# Patient Record
Sex: Female | Born: 1945 | Race: Black or African American | Hispanic: No | Marital: Married | Smoking: Former smoker
Health system: Southern US, Community
[De-identification: ages and names within clinical notes are randomized; demographics above are authoritative.]

## PROBLEM LIST (undated history)

## (undated) DIAGNOSIS — R7303 Prediabetes: Secondary | ICD-10-CM

## (undated) DIAGNOSIS — G8929 Other chronic pain: Secondary | ICD-10-CM

## (undated) DIAGNOSIS — R002 Palpitations: Secondary | ICD-10-CM

## (undated) DIAGNOSIS — I10 Essential (primary) hypertension: Secondary | ICD-10-CM

## (undated) DIAGNOSIS — J189 Pneumonia, unspecified organism: Secondary | ICD-10-CM

## (undated) DIAGNOSIS — K219 Gastro-esophageal reflux disease without esophagitis: Secondary | ICD-10-CM

## (undated) DIAGNOSIS — K5790 Diverticulosis of intestine, part unspecified, without perforation or abscess without bleeding: Secondary | ICD-10-CM

## (undated) DIAGNOSIS — E785 Hyperlipidemia, unspecified: Secondary | ICD-10-CM

## (undated) DIAGNOSIS — R7611 Nonspecific reaction to tuberculin skin test without active tuberculosis: Secondary | ICD-10-CM

## (undated) DIAGNOSIS — M1990 Unspecified osteoarthritis, unspecified site: Secondary | ICD-10-CM

## (undated) DIAGNOSIS — M549 Dorsalgia, unspecified: Secondary | ICD-10-CM

## (undated) HISTORY — DX: Nonspecific reaction to tuberculin skin test without active tuberculosis: R76.11

## (undated) HISTORY — PX: TUBAL LIGATION: SHX77

## (undated) HISTORY — DX: Dorsalgia, unspecified: M54.9

## (undated) HISTORY — DX: Other chronic pain: G89.29

## (undated) HISTORY — DX: Palpitations: R00.2

## (undated) HISTORY — DX: Diverticulosis of intestine, part unspecified, without perforation or abscess without bleeding: K57.90

## (undated) HISTORY — DX: Hyperlipidemia, unspecified: E78.5

## (undated) HISTORY — DX: Essential (primary) hypertension: I10

---

## 1980-07-24 HISTORY — PX: CHOLECYSTECTOMY: SHX55

## 1997-12-18 ENCOUNTER — Ambulatory Visit (HOSPITAL_COMMUNITY): Admission: RE | Admit: 1997-12-18 | Discharge: 1997-12-18 | Payer: Self-pay | Admitting: Family Medicine

## 1999-09-07 ENCOUNTER — Other Ambulatory Visit: Admission: RE | Admit: 1999-09-07 | Discharge: 1999-09-07 | Payer: Self-pay | Admitting: Family Medicine

## 2000-09-17 ENCOUNTER — Encounter: Admission: RE | Admit: 2000-09-17 | Discharge: 2000-09-17 | Payer: Self-pay | Admitting: Family Medicine

## 2000-09-25 ENCOUNTER — Ambulatory Visit (HOSPITAL_COMMUNITY): Admission: RE | Admit: 2000-09-25 | Discharge: 2000-09-25 | Payer: Self-pay

## 2001-06-10 ENCOUNTER — Encounter: Admission: RE | Admit: 2001-06-10 | Discharge: 2001-06-10 | Payer: Self-pay | Admitting: Sports Medicine

## 2001-09-30 ENCOUNTER — Ambulatory Visit (HOSPITAL_COMMUNITY): Admission: RE | Admit: 2001-09-30 | Discharge: 2001-09-30 | Payer: Self-pay | Admitting: *Deleted

## 2001-09-30 ENCOUNTER — Encounter: Payer: Self-pay | Admitting: *Deleted

## 2001-11-04 ENCOUNTER — Encounter: Admission: RE | Admit: 2001-11-04 | Discharge: 2001-11-04 | Payer: Self-pay | Admitting: Family Medicine

## 2001-12-06 ENCOUNTER — Ambulatory Visit (HOSPITAL_COMMUNITY): Admission: RE | Admit: 2001-12-06 | Discharge: 2001-12-06 | Payer: Self-pay | Admitting: Family Medicine

## 2001-12-06 ENCOUNTER — Encounter: Admission: RE | Admit: 2001-12-06 | Discharge: 2001-12-06 | Payer: Self-pay | Admitting: Family Medicine

## 2001-12-09 ENCOUNTER — Encounter: Admission: RE | Admit: 2001-12-09 | Discharge: 2001-12-09 | Payer: Self-pay | Admitting: Family Medicine

## 2001-12-10 ENCOUNTER — Encounter: Admission: RE | Admit: 2001-12-10 | Discharge: 2001-12-10 | Payer: Self-pay | Admitting: Family Medicine

## 2002-01-13 ENCOUNTER — Encounter: Admission: RE | Admit: 2002-01-13 | Discharge: 2002-01-13 | Payer: Self-pay | Admitting: Family Medicine

## 2002-03-10 ENCOUNTER — Encounter: Admission: RE | Admit: 2002-03-10 | Discharge: 2002-03-10 | Payer: Self-pay | Admitting: Family Medicine

## 2002-03-21 ENCOUNTER — Encounter: Admission: RE | Admit: 2002-03-21 | Discharge: 2002-03-21 | Payer: Self-pay | Admitting: Family Medicine

## 2002-04-01 ENCOUNTER — Encounter: Admission: RE | Admit: 2002-04-01 | Discharge: 2002-04-01 | Payer: Self-pay | Admitting: Family Medicine

## 2002-08-29 ENCOUNTER — Encounter: Admission: RE | Admit: 2002-08-29 | Discharge: 2002-08-29 | Payer: Self-pay | Admitting: Family Medicine

## 2002-09-15 ENCOUNTER — Encounter: Admission: RE | Admit: 2002-09-15 | Discharge: 2002-09-15 | Payer: Self-pay | Admitting: Family Medicine

## 2002-09-24 ENCOUNTER — Encounter: Admission: RE | Admit: 2002-09-24 | Discharge: 2002-09-24 | Payer: Self-pay | Admitting: Family Medicine

## 2002-10-02 ENCOUNTER — Ambulatory Visit (HOSPITAL_COMMUNITY): Admission: RE | Admit: 2002-10-02 | Discharge: 2002-10-02 | Payer: Self-pay | Admitting: Family Medicine

## 2003-03-05 ENCOUNTER — Encounter: Admission: RE | Admit: 2003-03-05 | Discharge: 2003-03-05 | Payer: Self-pay | Admitting: Family Medicine

## 2003-06-26 ENCOUNTER — Encounter: Admission: RE | Admit: 2003-06-26 | Discharge: 2003-06-26 | Payer: Self-pay | Admitting: Family Medicine

## 2003-08-25 ENCOUNTER — Encounter: Admission: RE | Admit: 2003-08-25 | Discharge: 2003-08-25 | Payer: Self-pay | Admitting: Family Medicine

## 2003-09-11 ENCOUNTER — Encounter: Admission: RE | Admit: 2003-09-11 | Discharge: 2003-09-11 | Payer: Self-pay | Admitting: Family Medicine

## 2003-10-12 ENCOUNTER — Encounter: Admission: RE | Admit: 2003-10-12 | Discharge: 2003-10-12 | Payer: Self-pay | Admitting: Family Medicine

## 2003-10-19 ENCOUNTER — Encounter: Admission: RE | Admit: 2003-10-19 | Discharge: 2003-10-19 | Payer: Self-pay | Admitting: Family Medicine

## 2004-02-08 ENCOUNTER — Encounter: Admission: RE | Admit: 2004-02-08 | Discharge: 2004-02-08 | Payer: Self-pay | Admitting: Family Medicine

## 2004-02-08 ENCOUNTER — Ambulatory Visit (HOSPITAL_COMMUNITY): Admission: RE | Admit: 2004-02-08 | Discharge: 2004-02-08 | Payer: Self-pay | Admitting: Family Medicine

## 2004-06-06 ENCOUNTER — Ambulatory Visit (HOSPITAL_COMMUNITY): Admission: RE | Admit: 2004-06-06 | Discharge: 2004-06-06 | Payer: Self-pay | Admitting: Gastroenterology

## 2004-06-06 ENCOUNTER — Encounter (INDEPENDENT_AMBULATORY_CARE_PROVIDER_SITE_OTHER): Payer: Self-pay | Admitting: *Deleted

## 2004-11-07 ENCOUNTER — Ambulatory Visit: Payer: Self-pay | Admitting: Family Medicine

## 2004-11-14 ENCOUNTER — Ambulatory Visit (HOSPITAL_COMMUNITY): Admission: RE | Admit: 2004-11-14 | Discharge: 2004-11-14 | Payer: Self-pay | Admitting: Internal Medicine

## 2005-04-14 ENCOUNTER — Ambulatory Visit: Payer: Self-pay | Admitting: Family Medicine

## 2005-05-15 ENCOUNTER — Ambulatory Visit: Payer: Self-pay | Admitting: Family Medicine

## 2005-05-15 ENCOUNTER — Ambulatory Visit (HOSPITAL_COMMUNITY): Admission: RE | Admit: 2005-05-15 | Discharge: 2005-05-15 | Payer: Self-pay | Admitting: Family Medicine

## 2005-05-15 ENCOUNTER — Encounter: Admission: RE | Admit: 2005-05-15 | Discharge: 2005-05-15 | Payer: Self-pay | Admitting: Sports Medicine

## 2005-05-29 ENCOUNTER — Encounter (INDEPENDENT_AMBULATORY_CARE_PROVIDER_SITE_OTHER): Payer: Self-pay | Admitting: *Deleted

## 2005-05-29 LAB — CONVERTED CEMR LAB

## 2005-06-05 ENCOUNTER — Ambulatory Visit: Payer: Self-pay | Admitting: Family Medicine

## 2005-09-29 ENCOUNTER — Ambulatory Visit: Payer: Self-pay | Admitting: Family Medicine

## 2005-11-20 ENCOUNTER — Ambulatory Visit (HOSPITAL_COMMUNITY): Admission: RE | Admit: 2005-11-20 | Discharge: 2005-11-20 | Payer: Self-pay | Admitting: Family Medicine

## 2005-12-27 ENCOUNTER — Ambulatory Visit: Payer: Self-pay | Admitting: Family Medicine

## 2005-12-28 ENCOUNTER — Ambulatory Visit: Payer: Self-pay | Admitting: Family Medicine

## 2006-02-12 ENCOUNTER — Ambulatory Visit: Payer: Self-pay | Admitting: Family Medicine

## 2006-04-30 ENCOUNTER — Ambulatory Visit: Payer: Self-pay | Admitting: Family Medicine

## 2006-06-06 ENCOUNTER — Ambulatory Visit: Payer: Self-pay | Admitting: Family Medicine

## 2006-09-20 DIAGNOSIS — K5730 Diverticulosis of large intestine without perforation or abscess without bleeding: Secondary | ICD-10-CM | POA: Insufficient documentation

## 2006-09-20 DIAGNOSIS — I10 Essential (primary) hypertension: Secondary | ICD-10-CM | POA: Insufficient documentation

## 2006-09-20 DIAGNOSIS — N951 Menopausal and female climacteric states: Secondary | ICD-10-CM | POA: Insufficient documentation

## 2006-09-20 DIAGNOSIS — G44209 Tension-type headache, unspecified, not intractable: Secondary | ICD-10-CM | POA: Insufficient documentation

## 2006-09-20 DIAGNOSIS — G4700 Insomnia, unspecified: Secondary | ICD-10-CM | POA: Insufficient documentation

## 2006-09-20 DIAGNOSIS — E669 Obesity, unspecified: Secondary | ICD-10-CM

## 2006-09-21 ENCOUNTER — Encounter: Admission: RE | Admit: 2006-09-21 | Discharge: 2006-09-21 | Payer: Self-pay | Admitting: Sports Medicine

## 2006-09-21 ENCOUNTER — Ambulatory Visit: Payer: Self-pay | Admitting: Family Medicine

## 2006-09-21 ENCOUNTER — Encounter (INDEPENDENT_AMBULATORY_CARE_PROVIDER_SITE_OTHER): Payer: Self-pay | Admitting: *Deleted

## 2006-09-28 ENCOUNTER — Telehealth (INDEPENDENT_AMBULATORY_CARE_PROVIDER_SITE_OTHER): Payer: Self-pay | Admitting: Family Medicine

## 2006-11-23 ENCOUNTER — Ambulatory Visit (HOSPITAL_COMMUNITY): Admission: RE | Admit: 2006-11-23 | Discharge: 2006-11-23 | Payer: Self-pay | Admitting: Family Medicine

## 2006-12-13 ENCOUNTER — Telehealth (INDEPENDENT_AMBULATORY_CARE_PROVIDER_SITE_OTHER): Payer: Self-pay | Admitting: Family Medicine

## 2007-02-07 ENCOUNTER — Telehealth (INDEPENDENT_AMBULATORY_CARE_PROVIDER_SITE_OTHER): Payer: Self-pay | Admitting: Family Medicine

## 2007-03-26 ENCOUNTER — Ambulatory Visit: Payer: Self-pay | Admitting: Family Medicine

## 2007-03-27 ENCOUNTER — Encounter (INDEPENDENT_AMBULATORY_CARE_PROVIDER_SITE_OTHER): Payer: Self-pay | Admitting: Family Medicine

## 2007-03-29 ENCOUNTER — Encounter (INDEPENDENT_AMBULATORY_CARE_PROVIDER_SITE_OTHER): Payer: Self-pay | Admitting: Family Medicine

## 2007-03-29 ENCOUNTER — Ambulatory Visit: Payer: Self-pay | Admitting: Family Medicine

## 2007-04-02 ENCOUNTER — Encounter (INDEPENDENT_AMBULATORY_CARE_PROVIDER_SITE_OTHER): Payer: Self-pay | Admitting: Family Medicine

## 2007-04-02 LAB — CONVERTED CEMR LAB
ALT: 23 units/L (ref 0–35)
AST: 19 units/L (ref 0–37)
Albumin: 4.3 g/dL (ref 3.5–5.2)
Alkaline Phosphatase: 76 units/L (ref 39–117)
BUN: 14 mg/dL (ref 6–23)
CO2: 27 meq/L (ref 19–32)
Calcium: 9.3 mg/dL (ref 8.4–10.5)
Chloride: 104 meq/L (ref 96–112)
Cholesterol: 222 mg/dL — ABNORMAL HIGH (ref 0–200)
Creatinine, Ser: 0.78 mg/dL (ref 0.40–1.20)
Glucose, Bld: 92 mg/dL (ref 70–99)
HDL: 60 mg/dL (ref >39)
LDL Cholesterol: 137 mg/dL — ABNORMAL HIGH (ref 0–99)
Potassium: 4.2 meq/L (ref 3.5–5.3)
Sodium: 141 meq/L (ref 135–145)
Total Bilirubin: 0.8 mg/dL (ref 0.3–1.2)
Total CHOL/HDL Ratio: 3.7
Total Protein: 6.8 g/dL (ref 6.0–8.3)
Triglycerides: 123 mg/dL (ref <150)
VLDL: 25 mg/dL (ref 0–40)

## 2007-04-04 ENCOUNTER — Encounter (INDEPENDENT_AMBULATORY_CARE_PROVIDER_SITE_OTHER): Payer: Self-pay | Admitting: Family Medicine

## 2007-04-06 ENCOUNTER — Emergency Department (HOSPITAL_COMMUNITY): Admission: EM | Admit: 2007-04-06 | Discharge: 2007-04-07 | Payer: Self-pay | Admitting: Emergency Medicine

## 2007-04-11 ENCOUNTER — Ambulatory Visit: Payer: Self-pay | Admitting: Family Medicine

## 2007-04-11 DIAGNOSIS — R42 Dizziness and giddiness: Secondary | ICD-10-CM | POA: Insufficient documentation

## 2007-05-08 ENCOUNTER — Telehealth: Payer: Self-pay | Admitting: *Deleted

## 2007-07-24 ENCOUNTER — Telehealth: Payer: Self-pay | Admitting: *Deleted

## 2007-07-25 HISTORY — PX: ROTATOR CUFF REPAIR: SHX139

## 2007-10-03 ENCOUNTER — Telehealth: Payer: Self-pay | Admitting: *Deleted

## 2007-10-30 ENCOUNTER — Telehealth (INDEPENDENT_AMBULATORY_CARE_PROVIDER_SITE_OTHER): Payer: Self-pay | Admitting: Family Medicine

## 2007-11-28 ENCOUNTER — Ambulatory Visit (HOSPITAL_COMMUNITY): Admission: RE | Admit: 2007-11-28 | Discharge: 2007-11-28 | Payer: Self-pay | Admitting: Family Medicine

## 2007-12-09 ENCOUNTER — Encounter (INDEPENDENT_AMBULATORY_CARE_PROVIDER_SITE_OTHER): Payer: Self-pay | Admitting: Family Medicine

## 2007-12-09 ENCOUNTER — Ambulatory Visit: Payer: Self-pay | Admitting: Family Medicine

## 2007-12-09 DIAGNOSIS — R011 Cardiac murmur, unspecified: Secondary | ICD-10-CM | POA: Insufficient documentation

## 2007-12-19 LAB — CONVERTED CEMR LAB
ALT: 21 units/L (ref 0–35)
AST: 19 units/L (ref 0–37)
Albumin: 4.2 g/dL (ref 3.5–5.2)
Alkaline Phosphatase: 68 units/L (ref 39–117)
BUN: 17 mg/dL (ref 6–23)
CO2: 27 meq/L (ref 19–32)
Calcium: 9.6 mg/dL (ref 8.4–10.5)
Chloride: 104 meq/L (ref 96–112)
Creatinine, Ser: 0.8 mg/dL (ref 0.40–1.20)
Direct LDL: 121 mg/dL — ABNORMAL HIGH
Glucose, Bld: 117 mg/dL — ABNORMAL HIGH (ref 70–99)
HCT: 39.7 % (ref 36.0–46.0)
Hemoglobin: 13 g/dL (ref 12.0–15.0)
MCHC: 32.7 g/dL (ref 30.0–36.0)
MCV: 89 fL (ref 78.0–100.0)
Platelets: 226 10*3/uL (ref 150–400)
Potassium: 3.5 meq/L (ref 3.5–5.3)
RBC: 4.46 M/uL (ref 3.87–5.11)
RDW: 14.4 % (ref 11.5–15.5)
Sodium: 141 meq/L (ref 135–145)
TSH: 0.972 microintl units/mL (ref 0.350–5.50)
Total Bilirubin: 0.6 mg/dL (ref 0.3–1.2)
Total Protein: 6.6 g/dL (ref 6.0–8.3)
WBC: 7.8 10*3/uL (ref 4.0–10.5)

## 2008-04-02 ENCOUNTER — Emergency Department (HOSPITAL_COMMUNITY): Admission: EM | Admit: 2008-04-02 | Discharge: 2008-04-02 | Payer: Self-pay | Admitting: Family Medicine

## 2008-04-02 ENCOUNTER — Telehealth: Payer: Self-pay | Admitting: *Deleted

## 2008-04-08 ENCOUNTER — Encounter: Admission: RE | Admit: 2008-04-08 | Discharge: 2008-06-29 | Payer: Self-pay | Admitting: Orthopaedic Surgery

## 2008-04-30 ENCOUNTER — Encounter (INDEPENDENT_AMBULATORY_CARE_PROVIDER_SITE_OTHER): Payer: Self-pay | Admitting: Family Medicine

## 2008-05-19 ENCOUNTER — Telehealth: Payer: Self-pay | Admitting: *Deleted

## 2008-06-16 ENCOUNTER — Ambulatory Visit: Payer: Self-pay | Admitting: Family Medicine

## 2008-09-09 ENCOUNTER — Encounter: Admission: RE | Admit: 2008-09-09 | Discharge: 2008-11-04 | Payer: Self-pay | Admitting: Orthopaedic Surgery

## 2008-12-23 ENCOUNTER — Ambulatory Visit (HOSPITAL_COMMUNITY): Admission: RE | Admit: 2008-12-23 | Discharge: 2008-12-23 | Payer: Self-pay | Admitting: Family Medicine

## 2009-04-26 ENCOUNTER — Ambulatory Visit: Payer: Self-pay | Admitting: Family Medicine

## 2009-04-26 DIAGNOSIS — I8390 Asymptomatic varicose veins of unspecified lower extremity: Secondary | ICD-10-CM | POA: Insufficient documentation

## 2009-04-28 ENCOUNTER — Encounter: Payer: Self-pay | Admitting: Family Medicine

## 2009-04-28 ENCOUNTER — Ambulatory Visit: Payer: Self-pay | Admitting: Family Medicine

## 2009-05-05 DIAGNOSIS — R7301 Impaired fasting glucose: Secondary | ICD-10-CM | POA: Insufficient documentation

## 2009-05-05 LAB — CONVERTED CEMR LAB
ALT: 21 units/L (ref 0–35)
AST: 22 units/L (ref 0–37)
Albumin: 4.4 g/dL (ref 3.5–5.2)
Alkaline Phosphatase: 80 units/L (ref 39–117)
BUN: 15 mg/dL (ref 6–23)
CO2: 27 meq/L (ref 19–32)
Calcium: 9.6 mg/dL (ref 8.4–10.5)
Chloride: 103 meq/L (ref 96–112)
Cholesterol: 240 mg/dL — ABNORMAL HIGH (ref 0–200)
Creatinine, Ser: 0.74 mg/dL (ref 0.40–1.20)
Glucose, Bld: 117 mg/dL — ABNORMAL HIGH (ref 70–99)
HCT: 40.8 % (ref 36.0–46.0)
HDL: 63 mg/dL (ref >39)
Hemoglobin: 13.1 g/dL (ref 12.0–15.0)
LDL Cholesterol: 151 mg/dL — ABNORMAL HIGH (ref 0–99)
MCHC: 32.1 g/dL (ref 30.0–36.0)
MCV: 90.9 fL (ref 78.0–100.0)
Platelets: 236 10*3/uL (ref 150–400)
Potassium: 3.9 meq/L (ref 3.5–5.3)
RBC: 4.49 M/uL (ref 3.87–5.11)
RDW: 14.2 % (ref 11.5–15.5)
Sodium: 143 meq/L (ref 135–145)
Total Bilirubin: 0.8 mg/dL (ref 0.3–1.2)
Total CHOL/HDL Ratio: 3.8
Total Protein: 6.8 g/dL (ref 6.0–8.3)
Triglycerides: 131 mg/dL (ref <150)
VLDL: 26 mg/dL (ref 0–40)
WBC: 7 10*3/uL (ref 4.0–10.5)

## 2009-05-21 ENCOUNTER — Encounter: Payer: Self-pay | Admitting: Family Medicine

## 2009-05-21 ENCOUNTER — Ambulatory Visit: Payer: Self-pay | Admitting: Family Medicine

## 2009-05-21 DIAGNOSIS — N904 Leukoplakia of vulva: Secondary | ICD-10-CM | POA: Insufficient documentation

## 2009-05-27 ENCOUNTER — Encounter: Payer: Self-pay | Admitting: Family Medicine

## 2009-06-10 ENCOUNTER — Encounter: Payer: Self-pay | Admitting: Family Medicine

## 2009-09-01 ENCOUNTER — Ambulatory Visit: Payer: Self-pay | Admitting: Family Medicine

## 2009-09-01 DIAGNOSIS — G5600 Carpal tunnel syndrome, unspecified upper limb: Secondary | ICD-10-CM

## 2009-09-01 HISTORY — DX: Carpal tunnel syndrome, unspecified upper limb: G56.00

## 2009-12-27 ENCOUNTER — Ambulatory Visit (HOSPITAL_COMMUNITY): Admission: RE | Admit: 2009-12-27 | Discharge: 2009-12-27 | Payer: Self-pay | Admitting: Family Medicine

## 2010-01-14 ENCOUNTER — Ambulatory Visit: Payer: Self-pay | Admitting: Family Medicine

## 2010-02-23 ENCOUNTER — Telehealth: Payer: Self-pay | Admitting: Family Medicine

## 2010-07-21 ENCOUNTER — Telehealth: Payer: Self-pay | Admitting: Family Medicine

## 2010-07-24 HISTORY — PX: CARPAL TUNNEL RELEASE: SHX101

## 2010-08-11 ENCOUNTER — Telehealth: Payer: Self-pay | Admitting: Family Medicine

## 2010-08-25 NOTE — Progress Notes (Signed)
Summary: refill  Phone Note Refill Request Call back at Home Phone [phone] Message from:  Patient  Refills Requested: Medication #1:  VALTREX 500 MG  TABS Take one tablet two times a day for 3 days if have recurrence   Notes: she mails this and needs written Please call when ready  Initial call taken by: De Nurse,  August 11, 2010 4:20 PM  Follow-up for Phone Call        letf message ready forp ickup Follow-up by: Delbert Harness MD,  August 12, 2010 8:34 AM    Prescriptions: VALTREX 500 MG  TABS (VALACYCLOVIR HCL) Take one tablet two times a day for 3 days if have recurrence  #12 x 2   Entered and Authorized by:   Delbert Harness MD   Signed by:   Delbert Harness MD on 08/12/2010   Method used:   Print then Give to Patient   RxID:   [id number]

## 2010-08-25 NOTE — Assessment & Plan Note (Signed)
Summary: hot flashes,df   Vital Signs:  Patient profile:   65 year old female Weight:      207.4 pounds Temp:     98.2 degrees F oral Pulse rate:   73 / minute Pulse rhythm:   regular BP sitting:   124 / 72  (right arm) Cuff size:   large  Vitals Entered By: Loralee Pacas CMA (January 14, 2010 10:25 AM) CC: follow-up visit Is Patient Diabetic? No Comments pt needs refills and concerned with hot flashes and wants something for vaginal dryness   Primary Care Provider:  Delbert Harness MD  CC:  follow-up visit.  History of Present Illness: 65 yo here to discuss menpausal symptoms  since tapering off premarin, has noticed increase in nighttime hot flashes and vaginal dryness.  Has tried herbals such as black cohosh, soy without releif.  Symptoms are preventing her from sleep and significantly lower her quality of life.  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  Discussed non-hormonal therapies for hot flashes.  Patient reluctant to try SSRI.  WIll try gabapentin at night.  Her updated medication list for this problem includes:    Premarin 0.625 Mg/gm Crea (Estrogens, conjugated) .Marland Kitchen... Apply 0.5 gm vaginally daily for 2-3 weeks, then once weekly  Orders: [facility]- Est Level  3 (04540)  Complete Medication List: 1)  Premarin 0.625 Mg/gm Crea (Estrogens, conjugated) .... Apply 0.5 gm vaginally daily for 2-3 weeks, then once weekly 2)  Hydrochlorothiazide 25 Mg Tabs (Hydrochlorothiazide) .... Take one (1) by mouth daily 3)  Lunesta 3 Mg Tabs (Eszopiclone) .... One tablet q hs as needed sleep 4)  Imitrex 25 Mg Tabs (Sumatriptan  succinate) .... Take one tablet at start of headache.  may repeat in 2 hours x 1 if needed. 5)  Valtrex 500 Mg Tabs (Valacyclovir hcl) .... Take one tablet two times a day for 3 days if have recurrence 6)  [facility]  .... Dx: varicose veins 7)  Neurontin 300 Mg Caps (Gabapentin) .... Take one tablet before bed for hot flashes  Patient Instructions: 1)  Great job on the weight loss and exercise! 2)  Discontinue hormone pills, start vaginal cream (premarin) 3)  Start neurontin for hot flashes Prescriptions: HYDROCHLOROTHIAZIDE 25 MG TABS (HYDROCHLOROTHIAZIDE) Take one (1) by mouth daily  #31 x 5   Entered and Authorized by:   Delbert Harness MD   Signed by:   Delbert Harness MD on 01/14/2010   Method used:   Print then Give to Patient   RxID:   [id number] VALTREX 500 MG  TABS (VALACYCLOVIR HCL) Take one tablet two times a day for 3 days if have recurrence  #12 x 2   Entered and Authorized by:   Delbert Harness MD   Signed by:   Delbert Harness MD on 01/14/2010   Method used:   Print then Give to Patient   RxID:   [id number] NEURONTIN 300 MG CAPS (GABAPENTIN) take one tablet before bed for hot flashes  #30 x 1   Entered and Authorized by:   Delbert Harness MD   Signed by:   Delbert Harness MD  on 01/14/2010   Method used:   Print then Give to Patient   RxID:   [id number] PREMARIN 0.625 MG/GM CREA (ESTROGENS, CONJUGATED) apply 0.5 gm vaginally daily for 2-3 weeks, then once weekly  #1 x 0   Entered and Authorized by:   Delbert Harness MD   Signed by:   Delbert Harness MD on 01/14/2010   Method used:   Print then Give to Patient   RxID:   [id number]    Prevention & Chronic Care Immunizations   Influenza vaccine: refused  (06/16/2008)   Influenza vaccine deferral: Refused  (04/26/2009)   Influenza vaccine due: Refused  (06/16/2008)    Tetanus booster: 05/21/2009: Tdap    Pneumococcal vaccine: Not documented   Pneumococcal vaccine deferral: Refused  (04/26/2009)    H. zoster vaccine:  06/16/2008: refused  Colorectal Screening   Hemoccult: Done.  (05/24/2001)   Hemoccult due: Not Indicated    Colonoscopy: Done.  (05/24/2004)   Colonoscopy due: 05/24/2014  Other Screening   Pap smear: NEGATIVE FOR INTRAEPITHELIAL LESIONS OR MALIGNANCY.  (05/21/2009)   Pap smear due: 06/06/2009    Mammogram: ASSESSMENT: Negative - BI-RADS 1^MM DIGITAL SCREENING  (12/27/2009)   Mammogram due: 12/23/2009    DXA bone density scan: Not documented   Smoking status: quit  (01/14/2010)  Lipids   Total Cholesterol: 240  (04/28/2009)   LDL: 151  (04/28/2009)   LDL Direct: 121  (12/09/2007)   HDL: 63  (04/28/2009)   Triglycerides: 131  (04/28/2009)  Hypertension   Last Blood Pressure: 124 / 72  (01/14/2010)   Serum creatinine: 0.74  (04/28/2009)   BMP action: Ordered   Serum potassium 3.9  (04/28/2009)  Self-Management Support :    Hypertension self-management support: Written self-care plan  (04/26/2009)

## 2010-08-25 NOTE — Progress Notes (Signed)
Summary: Rx  Phone Note Refill Request Call back at Home Phone [phone]   Refills Requested: Medication #1:  NEURONTIN 300 MG CAPS take two tablets before bed for hot flashes.  Medication #2:  HYDROCHLOROTHIAZIDE 25 MG TABS Take one (1) by mouth daily requesting handwritten rx for mail order  Initial call taken by: Knox Royalty,  July 21, 2010 4:38 PM    Prescriptions: NEURONTIN 300 MG CAPS (GABAPENTIN) take two tablets before bed for hot flashes  #180 x 1   Entered by:   Delbert Harness MD   Authorized by:   Marland Kitchen [facility]   Signed by:   Delbert Harness MD on 07/26/2010   Method used:   Print then Give to Patient   RxID:   [id number] HYDROCHLOROTHIAZIDE 25 MG TABS (HYDROCHLOROTHIAZIDE) Take one (1) by mouth daily  #90 x 1   Entered by:   Delbert Harness MD   Authorized by:   Marland Kitchen [facility]   Signed by:   Delbert Harness MD on 07/26/2010   Method used:   Print then Give to Patient   RxID:   [id number]  forwarded to pcp.Loralee Pacas CMA  July 22, 2010 12:16 PM  Left prescriprtion up front for pickup.  please remind patient to make appointment before next refill (june will be one year since last visit) Delbert Harness MD  July 26, 2010 9:25 AM   Appended Document: Rx attempted to call pt. no answer or VM  Appended Document: Rx called and informed pt of rx being up front for pick up

## 2010-08-25 NOTE — Progress Notes (Signed)
Summary: Rx  Phone Note Call from Patient Call back at [phone]   Summary of Call: Pt is needing a refill on antivert.  [facility] [address]. Initial call taken by: Haydee Salter,  October 30, 2007 9:43 AM  Follow-up for Phone Call        Will forward to MD Follow-up by: ASHA BENTON LPN,  October 30, 2007 9:47 AM      Prescriptions: ANTIVERT 50 MG  TABS (MECLIZINE HCL) Take one tablet as needed for vertigo  #30 x 1   Entered by:   Alanda Amass MD   Authorized by:   Marland Kitchen [facility]   Signed by:   Alanda Amass MD on 10/30/2007   Method used:   Electronically [facility] to ...       [facility] [facility] [address] [phone]*       [address]       [place], [place]  [address]       Ph: [phone] or [phone]       Fax: [phone]   RxID:   [id number]     Appended Document: Rx Prescriptions: ANTIVERT 50 MG  TABS (MECLIZINE HCL) Take one tablet as needed for vertigo  #30 x 1   Entered by:   Jone Baseman CMA   Authorized by:   Alanda Amass MD   Signed by:   Jone Baseman CMA on 11/04/2007   Method used:   Electronically sent to ...       [facility] [address] [phone]*       [address]       [place], [place]  [address]       Ph: [phone] or [phone]       Fax: [phone]   RxID:   [id number]

## 2010-08-25 NOTE — Assessment & Plan Note (Signed)
Summary: f/u meds/jdf   Vital Signs:  Patient Profile:   65 Years Old [name] Weight:      225 pounds Temp:     98.3 degrees F Pulse rate:   78 / minute BP sitting:   147 / 84  Pt. in pain?   no  Vitals Entered By: Jone Baseman CMA (March 26, 2007 2:24 PM)                  PCP:  Alanda Amass MD  Chief Complaint:  f/u meds.  History of Present Illness: Angie [name] with HTN here to follow up on HTN and medication refills.   HTN - Denies CP, SOB, has infrequent headaches, no vision changes, no leg swelling.   Medication Refills - Wants refills for Valtrex, Prempro, HCTZ, and Midrin so she can send Rx in to pharmaceutical company for insurance reasons.   Obesity - Does not want to see nutritionist. Saw one here in the past and knows what changes she needs to make in her diet. She is eating a lot of fried foods that she fries herself. She is also eating out more than she should and eating snacks like icecream at night. Only eating 2 meals a day. Is getting a little less exercise than before because sister just go pacemaker and also she is trying to care for her mom. She does step aerobics 1 hour per day. Admits to eating lots of junk food.   Current Allergies (reviewed today): No known allergies  Updated/Current Medications (including changes made in today's visit):  PREMPRO 0.45-1.5 MG  TABS (CONJ ESTROG-MEDROXYPROGEST ACE) Take one tablet by mouth daily HYDROCHLOROTHIAZIDE 25 MG TABS (HYDROCHLOROTHIAZIDE) Take one (1) by mouth daily LUNESTA 3 MG  TABS (ESZOPICLONE) One tablet q hs as needed sleep MIDRIN 325-65-100 MG  CAPS (APAP-ISOMETHEPTENE-DICHLORAL) Take one tablet by mouth as needed for headache VALTREX 500 MG  TABS (VALACYCLOVIR HCL) Take one tablet two times a day for 3 days if have recurrence       Physical Exam  General:     Well-developed,well-nourished,in no acute distress; alert,appropriate and cooperative throughout examination Head:    normocephalic and atraumatic.   Lungs:     Normal respiratory effort, chest expands symmetrically. Lungs are clear to auscultation, no crackles or wheezes. Heart:     normal rate, regular rhythm, no murmur, no gallop, and no rub.   Extremities:     No edema Psych:     normally interactive.      Impression & Recommendations:  Problem # 1:  HYPERTENSION, BENIGN SYSTEMIC (ICD-401.1) Assessment: Improved Slightly improved from last visit but still too high. Patient prefers to try diet and exercise changes. She will return in 1-2 months to follow up on this. Will continue HCTZ at current dose for now. Ordered FLP and CMP for her to schedule as a lab appointment.:::  Her updated medication list for this problem includes:    Hydrochlorothiazide 25 Mg Tabs (Hydrochlorothiazide) .Marland Kitchen... Take one (1) by mouth daily  Orders: [facility]- Est Level  3 ([address])  Future Orders: Comp Met-[facility] ([id number]) ... 06/22/2007 Lipid-[facility] ([id number]) ... 06/22/2007   Problem # 2:  OBESITY, NOS (ICD-278.00) Assessment: Unchanged Will try to increase exercise and avoid frying foods and unhealthy snacks. See patient instructions.  Orders: [facility]- Est Level  3 ([address]86)   Complete Medication List: 1)  Prempro 0.45-1.5 Mg Tabs (Conj estrog-medroxyprogest ace) .... Take one tablet by mouth daily 2)  Hydrochlorothiazide  25 Mg Tabs (Hydrochlorothiazide) .... Take one (1) by mouth daily 3)  Lunesta 3 Mg Tabs (Eszopiclone) .... One tablet q hs as needed sleep 4)  Midrin 325-65-100 Mg Caps (Apap-isometheptene-dichloral) .... Take one tablet by mouth as needed for headache 5)  Valtrex 500 Mg Tabs (Valacyclovir hcl) .... Take one tablet two times a day for 3 days if have recurrence   Patient Instructions: 1)  Please schedule a follow up appointment in 1-2 months. 2)  Try to avoid frying foods and eating unhealthy snacks.  3)  Ask your husband to help you.  4)  Try to avoid salty foods.  5)  We want to avoid you  having to take another blood pressure medication.     Prescriptions: VALTREX 500 MG  TABS (VALACYCLOVIR HCL) Take one tablet two times a day for 3 days if have recurrence  #12 x 3   Entered and Authorized by:   Alanda Amass MD   Signed by:   Alanda Amass MD on 03/26/2007   Method used:   Print then Give to Patient   RxID:   [id number] PREMPRO 0.45-1.5 MG  TABS (CONJ ESTROG-MEDROXYPROGEST ACE) Take one tablet by mouth daily  #30 x 6   Entered and Authorized by:   Alanda Amass MD   Signed by:   Alanda Amass MD on 03/26/2007   Method used:   Print then Give to Patient   RxID:   [id number] HYDROCHLOROTHIAZIDE 25 MG TABS (HYDROCHLOROTHIAZIDE) Take one (1) by mouth daily  #30 x 12   Entered and Authorized by:   Alanda Amass MD   Signed by:   Alanda Amass MD on 03/26/2007   Method used:   Print then Give to Patient   RxID:   [id number] LUNESTA 3 MG  TABS (ESZOPICLONE) One tablet q hs as needed sleep  #20 x 6   Entered and Authorized by:   Alanda Amass MD   Signed by:   Alanda Amass MD on 03/26/2007   Method used:   Print then Give to Patient   RxID:   [id number] MIDRIN 325-65-100 MG  CAPS (APAP-ISOMETHEPTENE-DICHLORAL) Take one tablet by mouth as needed for headache  #20 x 6   Entered and Authorized by:   Alanda Amass MD   Signed by:   Alanda Amass MD on 03/26/2007   Method used:   Print then Give to Patient   RxID:   [id number]

## 2010-08-25 NOTE — Letter (Signed)
Summary: Periodontal  Periodontal   Imported By: Haydee Salter 04/03/2007 13:38:07  _____________________________________________________________________  External Attachment:    Type:   Image     Comment:   External Document

## 2010-08-25 NOTE — Assessment & Plan Note (Signed)
Summary: f/u BP and weight/ACM   Vital Signs:  Patient Profile:   65 Years Old Female Height:     62.5 inches (158.75 cm) Weight:      227.9 pounds (103.59 kg) BMI:     41.17 Temp:     98 degrees F (36.67 degrees C) Pulse rate:   80 / minute BP sitting:   132 / 72  (left arm)  Pt. in pain?   no  Vitals Entered By: Theresia Lo RN (Dec 09, 2007 9:12 AM)              Is Patient Diabetic? No     PCP:  Kathelene Rumberger HOBBS MD  Chief Complaint:  needs refills on meds.  History of Present Illness: 65 yo AA female here to follow up and for refills  1. HTN - no SOB, CP, HA, vision changes, leg edema. BP at work is usually 130/70.  2. Obesity - Going to silver sneakers at the gym. Likes aerobics.   3. Wants refill on prempro. Has tried stopping in the past but had horrible symptoms. Like hotflashes. Would be willing to decrease the dose and try this. Does know that this causes an increase risk of CAD and breast cancer  4. Insomnia - only needing Lunesta 2-3 times a month  5. Wants refill on Valtrex - Will get occasional outbreaks of vesicles that burn and then scar on her lower back. These go away with valtrex. Would like refills.   6. Vertigo - Much improved. Wants refill on antivert.   7. Last 3 fingers will fall asleep. Has a brace for carpal tunnel but not wearing it.  - advised to try wrist brace and see if helps.     Current Allergies: No known allergies     Risk Factors:  Tobacco use:  quit    Year quit:  15 years ago    Physical Exam  General:     Well-developed,well-nourished,in no acute distress; alert,appropriate and cooperative throughout examination Head:     normocephalic and atraumatic.   Lungs:     Normal respiratory effort, chest expands symmetrically. Lungs are clear to auscultation, no crackles or wheezes. Heart:     Normal rate and regular rhythm. S1 and S2 normal Slight 1-2/6 systolic murmur RUSB.  Abdomen:     soft, non-tender, no  distention, and no masses.   Extremities:     No pedal edema Neurologic:     alert & oriented X3.      Impression & Recommendations:  Problem # 1:  HYPERTENSION, BENIGN SYSTEMIC (ICD-401.1) Assessment: Unchanged Well controlled on HCTZ.   Her updated medication list for this problem includes:    Hydrochlorothiazide 25 Mg Tabs (Hydrochlorothiazide) .Marland Kitchen... 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   Her updated medication list for this problem includes:    Prempro 0.3-1.5 Mg Tabs (Conj estrog-medroxyprogest ace) .Marland Kitchen... Take one tablet by mouth daily  Orders: [facility]- Est  Level 4 ([address]86)   Problem # 3:  OBESITY, NOS (ICD-278.00) Assessment: Deteriorated Gained 8 lbs. Diet and exercise discussed.  Orders: Direct LDL-[facility] ([address]295) TSH-[facility] [id number]) [facility]- Est  Level 4 ([address]25)   Problem # 4:  INSOMNIA NOS (ICD-780.52) Assessment: Unchanged Continue lunesta as needed.  Her updated medication  list for this problem includes:    Lunesta 3 Mg Tabs (Eszopiclone) ..... One tablet q hs as needed sleep  Orders: [facility]- Est  Level 4 ([address])   Problem # 5:  VERTIGO (ICD-780.4) Assessment: Improved  Her updated medication list for this problem includes:    Antivert 50 Mg Tabs (Meclizine hcl) .Marland Kitchen... Take one tablet as needed for vertigo    Promethazine Hcl 25 Mg Tabs (Promethazine hcl) ..... One tablet every 4-6 hours prn  Orders: [facility]- Est  Level 4 ([address])   Problem # 6:  SYSTOLIC MURMUR (UJW-119.2) Assessment: New Check CBC and TSH.  Orders: CBC-[facility] (14782) [facility]- Est  Level 4 ([address])   Complete Medication List: 1)  Prempro 0.3-1.5 Mg Tabs (Conj estrog-medroxyprogest ace) .... Take one tablet by mouth daily 2)  Hydrochlorothiazide  25 Mg Tabs (Hydrochlorothiazide) .... Take one (1) by mouth daily 3)  Lunesta 3 Mg Tabs (Eszopiclone) .... One tablet q hs as needed sleep 4)  Midrin 325-65-100 Mg Caps (Apap-isometheptene-dichloral) .... Take one tablet by mouth as needed for headache 5)  Valtrex 500 Mg Tabs (Valacyclovir hcl) .... Take one tablet two times a day for 3 days if have recurrence 6)  Antivert 50 Mg Tabs (Meclizine hcl) .... Take one tablet as needed for vertigo 7)  Promethazine Hcl 25 Mg Tabs (Promethazine hcl) .... One tablet every 4-6 hours prn   Patient Instructions: 1)  I would like to check some blood work today - we will check your thyroid, hemoglobin, electrolytes, and cholesterol. 2)  Come back in September to have your cholesterol checked and for your routine physical exam. 3)  Eat lots of fruits and vegetables. Watch out for saturated fats and cookes/cake.  4)  Try to continue exercising at least 3 times a week.  5)  We are decreasing your prempro to the 0.3 mg dose.    Prescriptions: PREMPRO 0.3-1.5 MG  TABS (CONJ ESTROG-MEDROXYPROGEST ACE) Take one tablet by mouth daily  #30 x 4   Entered and Authorized by:   Alanda Amass MD   Signed by:   Alanda Amass MD on 12/09/2007   Method used:   Print then Give to Patient   RxID:   [id number] ANTIVERT 50 MG  TABS (MECLIZINE HCL) Take one tablet as needed for vertigo  #30 x 2   Entered and Authorized by:   Alanda Amass MD   Signed by:   Alanda Amass MD on 12/09/2007   Method used:   Print then Give to Patient   RxID:   [id number] VALTREX 500 MG  TABS (VALACYCLOVIR HCL) Take one tablet two times a day for 3 days if have recurrence  #12 x 3   Entered and Authorized by:   Alanda Amass MD   Signed by:   Alanda Amass MD on 12/09/2007   Method used:   Print then Give to Patient   RxID:   [id number] LUNESTA 3 MG  TABS (ESZOPICLONE) One tablet q hs as needed sleep  #30 x 2   Entered and Authorized by:   Alanda Amass MD   Signed  by:   Alanda Amass MD on 12/09/2007   Method used:   Print then Give to Patient   RxID:   [id number] HYDROCHLOROTHIAZIDE 25 MG TABS (HYDROCHLOROTHIAZIDE) Take one (1) by mouth daily  #30 x 12   Entered and Authorized by:   Alanda Amass MD   Signed by:   Alanda Amass MD on 12/09/2007   Method used:  Print then Give to Patient   RxID:   [id number]  ]

## 2010-08-25 NOTE — Letter (Signed)
Summary: Results Follow-up Letter  [facility]  [address]   [place], [place] [address]   Phone: [phone]  Fax: [phone]    05/27/2009  [phone] [address] [place], [place]  [address]  Dear Angie [name],   The following are the results of your recent test(s):  Test     Result     Pap Smear    Normal__XXX_____  Not Normal_____     Comments:   Sincerely,  Delbert Harness MD [facility]           Appended Document: Results Follow-up Letter mailed.

## 2010-08-25 NOTE — Assessment & Plan Note (Signed)
Summary: numbness in hands,df   Vital Signs:  Patient profile:   65 year old female Height:      62.5 inches Weight:      220.6 pounds BMI:     39.85 Temp:     98.1 degrees F oral Pulse rate:   87 / minute BP sitting:   145 / 74  (right arm) Cuff size:   large  Vitals Entered By: Garen Grams LPN (September 01, 2009 2:49 PM) CC: numbness in right hand x 2 months Is Patient Diabetic? No Pain Assessment Patient in pain? no        Primary Care Provider:  Delbert Harness MD  CC:  numbness in right hand x 2 months.  History of Present Illness: 65 yo patient here for evaluation of right hand numbness x 2 months.  Has a history of left carpal tunnel that improved with bracing.  Now right hand numbness that wakes her up at night.  Has been doing knitting, right hand dominant.  Has not tried anythign except for usuing her lefthanded brace on her right hand for past 2-3 nights.  Does not bother much her during the day.  No trauma.  Menopause:   Has been successful with slow taper of prempro now usuing it only three days a week.  Habits & Providers  Alcohol-Tobacco-Diet     Tobacco Status: never  Current Medications (verified): 1)  Prempro 0.3-1.5 Mg  Tabs (Conj Estrog-Medroxyprogest Ace) .... Take One Tablet Daily Up To 5 Per Week. Tapering As Directed (Decrease By 1 Tablet Per Week Each Month) 2)  Hydrochlorothiazide 25 Mg Tabs (Hydrochlorothiazide) .... Take One (1) By Mouth Daily 3)  Lunesta 3 Mg  Tabs (Eszopiclone) .... One Tablet Q Hs As Needed Sleep 4)  Imitrex 25 Mg Tabs (Sumatriptan Succinate) .... Take One Tablet At [facility].  May Repeat in 2 Hours X 1 If Needed. 5)  Valtrex 500 Mg  Tabs (Valacyclovir Hcl) .... Take One Tablet Two Times A Day For 3 Days If Have Recurrence 6)  Ted Hose .... Dx: Varicose Veins  Allergies: No Known Drug Allergies PMH-FH-SH reviewed for relevance  Social History: Smoking Status:  never  Review of Systems      See HPI General:   Denies weakness. MS:  Denies joint pain and muscle weakness; numbness, tingling. Neuro:  Complains of numbness; denies weakness. Endo:  hot flashes.  Physical Exam  General:  alert, NAD, obese Msk:  positive Tinel's over right wrist.  Able to reproduce sensation.   Impression & Recommendations:  Problem # 1:  CARPAL TUNNEL SYNDROME (ICD-354.0) Will provide wrist brace- size large today. Fitted patient, given handout.  Advised on overuse, icing, nightly bracing and daytime when tolerated.  RTC if no improvement.  Orders: Brace, wrist- [facility] (Z6109) [facility]- Est  Level 4 (60454)  Problem # 2:  MENOPAUSAL SYNDROME (ICD-627.2)  Taking 3 tablets per week.  Doing well with taper with goal to decrease by one tablet weekly each month.  Her updated medication list for this problem includes:    Prempro 0.3-1.5 Mg Tabs (Conj estrog-medroxyprogest ace) .Marland Kitchen... Take one tablet daily up to 5 per week. tapering as directed (decrease by 1 tablet per week each month)  Orders: [facility]- Est  Level 4 (09811)  Problem # 3:  HYPERTENSION, BENIGN SYSTEMIC (ICD-401.1) Assessment: Unchanged  BP slightly above goal.  Discussed with patient home monitoring and lifestyle changes.  Will follow-up at next visit and if continued elevated,  will increase medications.  Her updated medication list for this problem includes:    Hydrochlorothiazide 25 Mg Tabs (Hydrochlorothiazide) .Marland Kitchen... Take one (1) by mouth daily  Orders: [facility]- Est  Level 4 (99214)  Complete Medication List: 1)  Prempro 0.3-1.5 Mg Tabs (Conj estrog-medroxyprogest ace) .... Take one tablet daily up to 5 per week. tapering as directed (decrease by 1 tablet per week each month) 2)  Hydrochlorothiazide 25 Mg Tabs (Hydrochlorothiazide) .... Take one (1) by mouth daily 3)  Lunesta 3 Mg Tabs (Eszopiclone) .... One tablet q hs as needed sleep 4)  Imitrex 25 Mg Tabs (Sumatriptan succinate) .... Take one tablet at start of headache.  may repeat in 2 hours x 1 if  needed. 5)  Valtrex 500 Mg Tabs (Valacyclovir hcl) .... Take one tablet two times a day for 3 days if have recurrence 6)  [facility]  .... Dx: varicose veins  Patient Instructions: 1)  Check blood pressure at home 2)  Wear wrist brace nightly. 3)  see handout on carpal tunnel syndrome. 4)  Follow-up in 2-3 months.   Prevention & Chronic Care Immunizations   Influenza vaccine: refused  (06/16/2008)   Influenza vaccine deferral: Refused  (04/26/2009)   Influenza vaccine due: Refused  (06/16/2008)    Tetanus booster: 05/21/2009: Tdap    Pneumococcal vaccine: Not documented   Pneumococcal vaccine deferral: Refused  (04/26/2009)    H. zoster vaccine: 06/16/2008: refused  Colorectal Screening   Hemoccult: Done.  (05/24/2001)   Hemoccult due: Not Indicated    Colonoscopy: Done.  (05/24/2004)   Colonoscopy due: 05/24/2014  Other Screening   Pap smear: NEGATIVE FOR INTRAEPITHELIAL LESIONS OR MALIGNANCY.  (05/21/2009)   Pap smear due: 06/06/2009    Mammogram: normal  (12/23/2008)   Mammogram due: 12/23/2009    DXA bone density scan: Not documented   Smoking status: never  (09/01/2009)  Lipids   Total Cholesterol: 240  (04/28/2009)   LDL: 151  (04/28/2009)   LDL Direct: 121  (12/09/2007)   HDL: 63  (04/28/2009)   Triglycerides: 131  (04/28/2009)  Hypertension   Last Blood Pressure: 145 / 74  (09/01/2009)   Serum creatinine: 0.74  (04/28/2009)   BMP action: Ordered   Serum potassium 3.9  (04/28/2009)    Hypertension flowsheet reviewed?: Yes   Progress toward BP goal: Improved  Self-Management Support :    Patient will work on the following items until the next clinic visit to reach self-care goals:     Medications and monitoring: check my blood sugar  (09/01/2009)     Eating: eat more vegetables, eat foods that are low in salt, eat baked foods instead of fried foods  (04/26/2009)    Hypertension self-management support: Written self-care plan  (04/26/2009)

## 2010-08-25 NOTE — Assessment & Plan Note (Signed)
Summary: to meet new dr,tcb   Vital Signs:  Patient profile:   65 year old female Weight:      225 pounds BMI:     40.64 Temp:     97.4 degrees F oral Pulse rate:   92 / minute BP sitting:   137 / 86  (left arm)  Vitals Entered By: Alphia Kava (April 26, 2009 4:23 PM) CC: Meet New MD Is Patient Diabetic? No    Allergies: No Known Drug Allergies  Past History:  Past Surgical History: Cholecystectomy - 02/21/1985,  colonoscopy -diverticulosis - 05/24/2004,  ovarian cyst removal - 02/21/1986 Rotator cuff repair - Left shoulder and right shoulder PMH-FH-SH reviewed-no changes except otherwise noted  Primary Care Provider:  Delbert Harness MD  CC:  Meet New MD.  History of Present Illness: 65 yo seen today for follow-up of htn  HYPERTENSION Meds: Taking and tolerating? yes Home BP's: no- says hard to take own BP and does not like automated cuffs.  Does occaisionally take BP at stores with values around 140/80. Chest Pain: no Dyspnea: no  Headache:  has has chronic headache for many years, well controlled with midrin as needed.  wants to change medicatiosn due to prolonged headache last month.  Similar in character and intensity as usual headaches but lasted 6 days instead of 2.  Fully resolved.  Has not tried other medications in the past.  Varicose veins: has had for many years, for the past two months, varicosities in left leg slightly tender.  Has tried to buy support hose at store but states unable to find in her size.  No leg swelling.  Does not stand on feet, not significantly distressing to patient  obesity:  says has done nutrition counseling in past.  feels like she knows what to do but not yet committed to doing what it takes.  HRT:  states using prempro for 10 years.  Says previous attempts at weaning have not gone well.  Has tried black cohosh without help.    Family History: Mother alive with DM, CHF, may need pacepaker Father died at 10 of MI, had DM, renal  failure, blindness;  Sister with breast ca in her 26`s, has a defibrillator; CAD sister with HTN;  daughter with asthma,   Social History: Smoking Status:  never  Review of Systems      See HPI General:  Denies fever and weight loss. Eyes:  Denies blurring. CV:  Denies chest pain or discomfort, difficulty breathing while lying down, leg cramps with exertion, lightheadness, palpitations, and swelling of feet. Resp:  Denies cough and shortness of breath. GI:  Denies abdominal pain, constipation, and diarrhea. MS:  Complains of joint pain and stiffness; denies muscle weakness; occaisional stiffness in knees/hips. Derm:  Complains of rash; intermittant shingles rash.  Physical Exam  General:  alert, NAD, obese Neck:  No deformities, masses, or tenderness noted. Lungs:  Normal respiratory effort, chest expands symmetrically. Lungs are clear to auscultation, no crackles or wheezes. Heart:  Normal rate and regular rhythm. S1 and S2 normal Slight 1-2/6 systolic murmur RUSB.  Abdomen:  soft, non-tender, no distention, and no masses.   Extremities:  no edema.  Both calves measure to about 18 inches.  Some small superficial varicosities noted on left calf which are tender to palpation.   Impression & Recommendations:  Problem # 1:  HYPERTENSION, BENIGN SYSTEMIC (ICD-401.1)  at goal.  Will check bmet  Her updated medication list for this problem includes:  Hydrochlorothiazide 25 Mg Tabs (Hydrochlorothiazide) .Marland Kitchen... Take one (1) by mouth daily  Orders: [facility]- Est  Level 4 (99214)Future Orders: Comp Met-[facility] ([id number]) ... 04/29/2010 Lipid-[facility] ([id number]) ... 05/05/2010 CBC-[facility] (29562) ... 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  advised elevation of feet.  Orders: [facility]- Est  Level 4 (13086)  Problem # 3:  TENSION HEADACHE (ICD-307.81) Assessment: Deteriorated  No warning signs for worrisome headache.   Will change to triptan per patient preference for new abortive therapy.  Her updated medication list for this problem includes:    Imitrex 25 Mg Tabs (Sumatriptan succinate) .Marland Kitchen... Take one tablet at start of headache.  may repeat in 2 hours x 1 if needed.  Orders: [facility]- Est  Level 4 (57846)  Problem # 4:  OBESITY, NOS (ICD-278.00) Assessment: Unchanged  discussed healthy lifestyle changes.  Patient declines nutrition counseling at this time.  Orders: [facility]- Est  Level 4 (99214)Future Orders: Comp Met-[facility] ([id number]) ... 04/29/2010 Lipid-[facility] ([id number]) ... 05/05/2010  Problem # 5:  Preventive Health Care (ICD-V70.0) declines flu vaccine will get TD now  Will check on zoster vaccine insurance coverage  Problem # 6:  MENOPAUSAL SYNDROME (ICD-627.2)  Concern for long term HRT in patient who has sister with Breast ca and CAD.  Has been on for 10 years.  reluctant to change.  Will inititiate slow taper over next 5 months.  Currenty uses about 5 days per week.  Will decrease by 1 tablet a week each month until off in next 4-6 months.  Her updated medication list for this problem includes:    Prempro 0.3-1.5 Mg Tabs (Conj estrog-medroxyprogest ace) .Marland Kitchen... Take one tablet daily up to 5 per week. tapering as directed (decrease by 1 tablet per week each month)  Orders: [facility]- Est  Level 4 (99214)Future Orders: Lipid-[facility] ([id number]) ... 05/05/2010 CBC-[facility] (25956) ... 05/06/2010  Complete Medication List: 1)  Prempro 0.3-1.5 Mg Tabs (Conj estrog-medroxyprogest ace) .... Take one tablet daily up to 5 per week. tapering as directed (decrease by 1 tablet per week each month) 2)  Hydrochlorothiazide 25 Mg Tabs (Hydrochlorothiazide) .... Take one (1) by mouth daily 3)  Lunesta 3 Mg Tabs (Eszopiclone) .... One tablet q hs as needed sleep 4)  Imitrex 25 Mg Tabs (Sumatriptan succinate) .... Take one tablet at start of headache.  may repeat in 2 hours x 1 if needed. 5)  Valtrex 500 Mg Tabs  (Valacyclovir hcl) .... Take one tablet two times a day for 3 days if have recurrence 6)  [facility]  .... Dx: varicose veins  Patient Instructions: 1)  check to see if insurance will cover zostavax 2)  make lab appt for fasting labs 3)  exercise 30 minutes 5 days a week. 4)  Wil sedn new prescription for sumatriptan 5)  will write presciprtion for ted hose. 6)  Will wean prempro to off in next 6 months. 7)  make follow-up appt for pap smear/woman exam. Prescriptions: VALTREX 500 MG  TABS (VALACYCLOVIR HCL) Take one tablet two times a day for 3 days if have recurrence  #12 x 2   Entered and Authorized by:   Delbert Harness MD   Signed by:   Delbert Harness MD on 04/26/2009   Method used:   Handwritten   RxID:   [id number] LUNESTA 3 MG  TABS (ESZOPICLONE) One tablet q hs as needed sleep  #30 x 1   Entered and Authorized by:  Delbert Harness MD   Signed by:   Delbert Harness MD on 04/26/2009   Method used:   Handwritten   RxID:   [id number] HYDROCHLOROTHIAZIDE 25 MG TABS (HYDROCHLOROTHIAZIDE) Take one (1) by mouth daily  #31 x 5   Entered and Authorized by:   Delbert Harness MD   Signed by:   Delbert Harness MD on 04/26/2009   Method used:   Handwritten   RxID:   [id number] TED HOSE Dx: varicose veins  #1 x 0   Entered and Authorized by:   Delbert Harness MD   Signed by:   Delbert Harness MD on 04/26/2009   Method used:   Handwritten   RxID:   [id number] IMITREX 25 MG TABS (SUMATRIPTAN SUCCINATE) take one tablet at start of headache.  May repeat in 2 hours x 1 if needed.  #10 x 3   Entered and Authorized by:   Delbert Harness MD   Signed by:   Delbert Harness MD on 04/26/2009   Method used:   Handwritten   RxID:   [id number]    Prevention & Chronic Care Immunizations   Influenza vaccine: refused  (06/16/2008)   Influenza vaccine deferral: Refused  (04/26/2009)   Influenza vaccine due: Refused  (06/16/2008)    Tetanus booster: Not documented    Pneumococcal vaccine: Not documented    Pneumococcal vaccine deferral: Refused  (04/26/2009)    H. zoster vaccine: 06/16/2008: refused  Colorectal Screening   Hemoccult: Done.  (05/24/2001)   Hemoccult due: Not Indicated    Colonoscopy: Done.  (05/24/2004)   Colonoscopy due: 05/24/2014  Other Screening   Pap smear: normal  (06/06/2006)   Pap smear due: 06/06/2009    Mammogram: normal  (12/23/2008)   Mammogram due: 12/23/2009    DXA bone density scan: Not documented   Smoking status: never  (04/26/2009)  Lipids   Total Cholesterol: 222  (03/29/2007)   LDL: 137  (03/29/2007)   LDL Direct: 121  (12/09/2007)   HDL: 60  (03/29/2007)   Triglycerides: 123  (03/29/2007)  Hypertension   Last Blood Pressure: 137 / 86  (04/26/2009)   Serum creatinine: 0.80  (12/09/2007)   BMP action: Ordered   Serum potassium 3.5  (12/09/2007) CMP ordered     Hypertension flowsheet reviewed?: Yes   Progress toward BP goal: At goal  Self-Management Support :    Patient will work on the following items until the next clinic visit to reach self-care goals:     Medications and monitoring: take my medicines every day, check my blood pressure  (04/26/2009)     Eating: eat more vegetables, eat foods that are low in salt, eat baked foods instead of fried foods  (04/26/2009)    Hypertension self-management support: Written self-care plan  (04/26/2009)   Hypertension self-care plan printed.   Nursing Instructions: Give tetanus booster today

## 2010-08-25 NOTE — Consult Note (Signed)
Summary: [facility]  [facility]   Imported By: Haydee Salter 05/21/2008 14:57:05  _____________________________________________________________________  External Attachment:    Type:   Image     Comment:   External Document

## 2010-08-25 NOTE — Progress Notes (Signed)
Summary: Lunesta  Phone Note Call from Patient Call back at [phone]   Summary of Call: Pt is requesting a refill on Lunesta.  Pt called the pharmacy yesterday for a refill and the pharmacist told her that Dr. Melynda Ripple crossed Alfonso Patten out.  [facility]  Needs script written to pick up.  Initial call taken by: Haydee Salter,  May 08, 2007 8:38 AM  Follow-up for Phone Call        Prescription done and placed at front desk. Patient may pick up or if she gives [place] a fax number we can fax it to her pharmacy in [place]. Please let patient know. (The reason there are 2 prescriptions in centricity is because one was not on tamper proof paper).  Follow-up by: Alanda Amass MD,  May 10, 2007 3:36 PM  Additional Follow-up for Phone Call Additional follow up Details #1::        Pt informed, she will pick it up on Monday Additional Follow-up by: [facility] CMA,  May 10, 2007 5:10 PM      Prescriptions: LUNESTA 3 MG  TABS (ESZOPICLONE) One tablet q hs as needed sleep  #20 x 3   Entered and Authorized by:   Alanda Amass MD   Signed by:   Alanda Amass MD on 05/10/2007   Method used:   Print then Give to Patient   RxID:   [id number] LUNESTA 3 MG  TABS (ESZOPICLONE) One tablet q hs as needed sleep  #20 x 3   Entered and Authorized by:   Alanda Amass MD   Signed by:   Alanda Amass MD on 05/10/2007   Method used:   Print then Give to Patient   RxID:   [id number]

## 2010-08-25 NOTE — Progress Notes (Signed)
Summary: Triage  Phone Note Call from Patient Call back at [phone]   Summary of Call: Pt is c/o lesion on vagina, wants to be worked in. Initial call taken by: Haydee Salter,  April 02, 2008 2:27 PM  Follow-up for Phone Call        itchy area on R labia. states it was dry & cracked & bleeding. [place] appt [place]. [place] beach [place]. appt made for Tuesday am. declined to go to urgent care Follow-up by: [facility] RN,  April 02, 2008 2:41 PM

## 2010-08-25 NOTE — Progress Notes (Signed)
Summary: Rx  Phone Note Call from Patient Call back at [facility] Phone [phone]   Summary of Call: needs a new rx written out of prempro. Initial call taken by: Haydee Salter,  May 19, 2008 4:01 PM  Follow-up for Phone Call        LMOVM that refill was sent Follow-up by: [facility] CMA,  May 20, 2008 12:14 PM      Prescriptions: PREMPRO 0.3-1.5 MG  TABS (CONJ ESTROG-MEDROXYPROGEST ACE) Take one tablet by mouth daily  #30 x 3   Entered and Authorized by:   Cyndia Bent MD   Signed by:   Cyndia Bent MD on 05/20/2008   Method used:   Electronically to        [facility]  [address] [phone]* (retail)       [address]       [place], [place]  [address]       Ph: [phone] or [phone]       Fax: [phone]   RxID:   [id number]   Appended Document: Rx pt is requesting a new rx to be physically written so she can send it off to the [place] pharmacy, pt sts it takes about 15-20 days to receive it once she mails it. may be reached at [phone]. ERIN ODELL 05/20/08 3:14 PM  Appended Document: Rx Pt. called ind informed Rx is in front office ready to be picked up.  c/o scrips only being written for 60mo at at time.  Advised to discuss with MD on next visit. ANITA HERBERT RN  May 22, 2008 9:03 AM    Clinical Lists Changes  Medications: Rx of PREMPRO 0.3-1.5 MG  TABS (CONJ ESTROG-MEDROXYPROGEST ACE) Take one tablet by mouth daily;  #30 x 3;  Signed;  Entered by: Cyndia Bent MD;  Authorized by: Cyndia Bent MD;  Method used: Print then Give to Patient Rx of PREMPRO 0.3-1.5 MG  TABS (CONJ ESTROG-MEDROXYPROGEST ACE) Take one tablet by mouth daily;  #30 x 3;  Signed;  Entered by: Cyndia Bent MD;  Authorized by: Cyndia Bent MD;  Method used: Print then Give to Patient    Prescriptions: PREMPRO 0.3-1.5 MG  TABS (CONJ ESTROG-MEDROXYPROGEST ACE) Take one tablet by mouth daily  #30 x 3   Entered and Authorized by:    Cyndia Bent MD   Signed by:   Cyndia Bent MD on 05/22/2008   Method used:   Print then Give to Patient   RxID:   [id number] PREMPRO 0.3-1.5 MG  TABS (CONJ ESTROG-MEDROXYPROGEST ACE) Take one tablet by mouth daily  #30 x 3   Entered and Authorized by:   Cyndia Bent MD   Signed by:   Cyndia Bent MD on 05/22/2008   Method used:   Print then Give to Patient   RxID:   [id number]  Printed and placed at front for patient to pick up. Cyndia Bent MD  May 22, 2008 8:39 AM

## 2010-08-25 NOTE — Letter (Signed)
Summary: Lipid Letter  All     ,     Phone:   Fax:     04/02/2007  [facility] [address] [place], [place]  [address]  Dear Angie [name]:  All your labs were good but your bad cholesterol is a little higher than I would like to see it. Please remember to continue exercising and making wise food choices. This will help both your cholesterol and your high blood pressure. Read food labels and stay away from those that have lots of cholesterol and saturated fats. Also make sure you are eating lots of fresh fruits and vegetables, not eating out much, and staying away from canned foods and frozen dinners.     Cholesterol:       222     Goal: <200   HDL "good" Cholesterol:   60     Goal: >40   LDL "bad" Cholesterol:   137     Goal: <130   Triglycerides:       123     Goal: <150   If you have any questions, please call. We appreciate being able to work with you.   Sincerely,    [facility] Geoffery Aultman HOBBS MD  Appended Document: Lipid Letter patient letter mailed

## 2010-08-25 NOTE — Assessment & Plan Note (Signed)
Summary: PAP/KH   Vital Signs:  Patient profile:   65 year old female Weight:      223.1 pounds BMI:     40.30 Temp:     98.3 degrees F Pulse rate:   80 / minute BP sitting:   149 / 80  (left arm)  Vitals Entered By: Starleen Blue RN (May 21, 2009 2:33 PM) CC: pap Is Patient Diabetic? No Pain Assessment Patient in pain? no        Primary Care Provider:  Delbert Harness MD  CC:  pap.  History of Present Illness: 65 yo here for annual gyn exam  LMP: > 10 years menopausal Contraception:n/a Regular Menses: n/a Hx of Anemia: no FHx of Breast, Uterine, Cervical or Ovarian Cancer:  breast ca in sister Last Pap:  ? 2 years ago Hx of Abnormal Pap:  No Desires STD testing: seuxally active, low risk mongamous Last Mammogram: 2010 Abnormalities on Self-exam:  no Hx of Abnormal Mammogram:  No  Other issues / complaints: tapering prempro- doing ok.   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,  lichen sclerosis et atrophicus (vaginal rash),  panic attacks, tennis elbow Rotator cuff tear - both shoulders HTN Obesity   Past Surgical History: Last updated: 04/26/2009 Cholecystectomy - 02/21/1985,  colonoscopy -diverticulosis - 05/24/2004,  ovarian cyst removal - 02/21/1986 Rotator cuff repair - Left shoulder and right shoulder  Family History: Last updated: 04/26/2009 Mother alive with DM, CHF, may need pacepaker Father died at 29 of MI, had DM, renal failure, blindness;  Sister with breast ca in her 23`s, has a defibrillator; CAD sister with HTN;  daughter with asthma,   Social History: Last updated: 06/16/2008 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  Moved to [place] from  [place] 65 yo to be near mother.  2 daughters, Ezequiel Essex, and Selena Batten, granddaughter, Mel Almond.  15 pack-yr tob hx but currently doesn't smoke.  No drugs or alcohol. Recently married and then separated. Has living will. Daughter and sister have vertigo. Living with husband of 7 years.  PMH-FH-SH reviewed-no changes except otherwise noted  Social History: Smoking Status:  quit  Review of Systems      See HPI General:  Denies fever and weight loss. GU:  Denies abnormal vaginal bleeding, discharge, dysuria, hematuria, urinary frequency, and urinary hesitancy.  Physical Exam  General:  alert, NAD, obese Breasts:  No mass, nodules, thickening, tenderness, bulging, retraction, inflamation, nipple discharge or skin changes noted.   Lungs:  Normal respiratory effort, chest expands symmetrically. Lungs are clear to auscultation, no crackles or wheezes. Heart:  Normal rate and regular rhythm. S1 and S2 normal without gallop, murmur, click, rub or other extra sounds. Abdomen:  Bowel sounds positive,abdomen soft and non-tender without masses, organomegaly or hernias noted. Genitalia:  Pelvic Exam:        External: normal female genitalia, lichen sclerosis et atrophicus noted with hypopigmentation of labia minora        Vagina: normal without lesions or masses        Cervix: normal without lesions or masses        Adnexa: normal bimanual exam without masses or fullness        Uterus:  normal by palpation        Pap smear: performed   Impression & Recommendations:  Problem # 1:  Gynecological examination-routine (ICD-V72.31) performed pap.  UTD on mammogram.  No signs of abnormal uterine bleeding.  Patient with no dyspaurenia or vaginal dryness.   Problem # 2:  UNSPECIFIED LICHEN (ICD-697.9) Hx of long-standing lichen scleoris.  never biopsied.  Posisbly treated with topical steroids in the past.  Asymptomatic for patient.  On exam, some thinning, but no loss of vulvar architecture, no suspicious lesions.  Will  continue to monitor annually.  Asked patient to monitor for suspicious lesions that may warrant biopsy given slight increase in SCC risk.  If pruritis or dryness becomes an issues, patient will return for treatment.  Problem # 3:  Preventive Health Care (ICD-V70.0) given script for zostavax today.  Got tetanus vaccine.  patient declines flu vaccine.  Complete Medication List: 1)  Prempro 0.3-1.5 Mg Tabs (Conj estrog-medroxyprogest ace) .... Take one tablet daily up to 5 per week. tapering as directed (decrease by 1 tablet per week each month) 2)  Hydrochlorothiazide 25 Mg Tabs (Hydrochlorothiazide) .... Take one (1) by mouth daily 3)  Lunesta 3 Mg Tabs (Eszopiclone) .... One tablet q hs as needed sleep 4)  Imitrex 25 Mg Tabs (Sumatriptan succinate) .... Take one tablet at start of headache.  may repeat in 2 hours x 1 if needed. 5)  Valtrex 500 Mg Tabs (Valacyclovir hcl) .... Take one tablet two times a day for 3 days if have recurrence 6)  [facility]  .... Dx: varicose veins  Other Orders: Pap Smear-[facility] ([address]) Tdap => 69yrs IM ([address]) Admin 1st Vaccine ([address]1478) Admin 1st Vaccine [facility]) [phone])    Tetanus/Td Vaccine    Vaccine Type: Tdap    Site: left deltoid    Mfr: Sanofi Pasteur    Dose: 0.5 ml    Route: IM    Given by: Starleen Blue RN    Exp. Date: 01/27/2011    Lot #: [id number]    VIS given: 06/11/07 version given May 21, 2009.

## 2010-08-25 NOTE — Progress Notes (Signed)
Summary: Rx Req  Phone Note Refill Request Call back at Home Phone [phone] Message from:  Patient  Refills Requested: Medication #1:  NEURONTIN 300 MG CAPS take one tablet before bed for hot flashes. [facility]  PT HAS BEEN TAKING 2 AT A TIME RX MIGHT NEED TO BE ADJUSTED.  Initial call taken by: Clydell Hakim,  February 23, 2010 11:01 AM  Follow-up for Phone Call        I printed out prescription and places in pickup box inf ront.  please let patient know she can get it at her conveneince. Follow-up by: Delbert Harness MD,  February 23, 2010 2:28 PM    New/Updated Medications: NEURONTIN 300 MG CAPS (GABAPENTIN) take two tablets before bed for hot flashes Prescriptions: NEURONTIN 300 MG CAPS (GABAPENTIN) take two tablets before bed for hot flashes  #60 x 3   Entered and Authorized by:   Delbert Harness MD   Signed by:   Delbert Harness MD on 02/23/2010   Method used:   Print then Give to Patient   RxID:   [id number]

## 2010-08-25 NOTE — Consult Note (Signed)
Summary: [facility]  [facility]   Imported By: Haydee Salter 05/15/2007 14:53:38  _____________________________________________________________________  External Attachment:    Type:   Image     Comment:   External Document

## 2010-08-25 NOTE — Progress Notes (Signed)
Summary: Requesting rx  Phone Note Call from Patient Call back at [phone]   Reason for Call: Refill Medication Summary of Call: pt is requesting an rx for antivert, [facility] Initial call taken by: ERIN LEVAN,  July 24, 2007 9:04 AM  Follow-up for Phone Call        Will forward to MD Follow-up by: ASHA BENTON LPN,  July 24, 2007 9:08 AM  Additional Follow-up for Phone Call Additional follow up Details #1::        Please let patient know, I sent this to her pharmacy.  Additional Follow-up by: Alanda Amass MD,  July 24, 2007 12:58 PM    Additional Follow-up for Phone Call Additional follow up Details #2::    Pt informed Follow-up by: [facility] CMA,  July 26, 2007 10:44 AM  New/Updated Medications: ANTIVERT 50 MG  TABS (MECLIZINE HCL) Take one tablet as needed for vertigo   Prescriptions: ANTIVERT 50 MG  TABS (MECLIZINE HCL) Take one tablet as needed for vertigo  #30 x 1   Entered and Authorized by:   Alanda Amass MD   Signed by:   Alanda Amass MD on 07/24/2007   Method used:   Electronically sent to ...       [facility] [address] [phone]*       [address]       [place], [place]  [address]       Ph: [phone] or [phone]       Fax: [phone]   RxID:   [id number]

## 2010-08-25 NOTE — Progress Notes (Signed)
Summary: Rx  Phone Note Call from Patient Call back at Home Phone [phone]   Summary of Call: Pt is requesting a new rx for prempro to send off to her mail order pharmacy. Initial call taken by: Haydee Salter,  October 03, 2007 11:51 AM  Follow-up for Phone Call        Will place at front desk. Please call and let patient know.  Follow-up by: Alanda Amass MD,  October 07, 2007 6:45 PM  Additional Follow-up for Phone Call Additional follow up Details #1::        Pt informed that Rx is ready for pick-up Additional Follow-up by: [facility] CMA,  October 08, 2007 8:41 AM      Prescriptions: PREMPRO 0.45-1.5 MG  TABS (CONJ ESTROG-MEDROXYPROGEST ACE) Take one tablet by mouth daily  #30 x 3   Entered by:   Alanda Amass MD   Authorized by:   Marland Kitchen [facility]   Signed by:   Alanda Amass MD on 10/07/2007   Method used:   Print then Give to Patient   RxID:   [id number]

## 2010-08-25 NOTE — Progress Notes (Signed)
Summary: Refill  Phone Note Call from Patient Call back at Home Phone [phone]   Summary of Call: needs refill on valcyclovere - [facility] Initial call taken by: Haydee Salter,  Dec 13, 2006 10:25 AM  Follow-up for Phone Call        valcyclovir- herpes med. out of it. needs hard copy script so the [place] will fill it for free. message to md for rx Follow-up by: Golden Circle RN,  Dec 13, 2006 11:40 AM  Additional Follow-up for Phone Call Additional follow up Details #1::        pt is checking status Additional Follow-up by: Haydee Salter,  Dec 21, 2006 9:24 AM   Additional Follow-up for Phone Call Additional follow up Details #2::    pt is checking status - needs call back today to know what is going on Follow-up by: Haydee Salter,  December 24, 2006 9:20 AM  Additional Follow-up for Phone Call Additional follow up Details #3:: Details for Additional Follow-up Action Taken: Prescription written and on chart. Will route back so patient will be notified.  Additional Follow-up by: Alanda Amass MD,  December 24, 2006 4:24 PM

## 2010-08-25 NOTE — Progress Notes (Signed)
Summary: Refill  Phone Note Call from Patient Call back at Home Phone [phone]   Reason for Call: Refill Medication Summary of Call: pt would like a refill on Lunesta - states she has no more refills Initial call taken by: Haydee Salter,  September 28, 2006 9:49 AM  Follow-up for Phone Call        Contacted Pt to inform her that Alfonso Patten was ready at pharmacy, Pt sts "i need a Rx to pick up and take to [place]", Rx voided in [facility], informed pt that i would send a note to Dr. Melynda Ripple to write a Rx. Follow-up by: Jone Baseman CMA,  September 28, 2006 10:23 AM   Additional Follow-up for Phone Call Additional follow up Details #2::    Prescription written. Please call patient and have them come pick it up. Thanks.  Follow-up by: Alanda Amass MD,  October 04, 2006 11:53 AM   Appended Document: Refill Placed Rx up front, left message to pt that "Rx was ready for pickup"

## 2010-08-25 NOTE — Assessment & Plan Note (Signed)
Summary: cpe/kh/ HTN   Vital Signs:  Patient Profile:   65 Years Old Female Height:     62.5 inches (158.75 cm) Weight:      223 pounds (101.36 kg) BMI:     40.28 Temp:     98.8 degrees F (37.11 degrees C) Pulse rate:   82 / minute BP sitting:   139 / 92  Pt. in pain?   no  Vitals Entered By: Jone Baseman CMA (June 16, 2008 11:02 AM)                 Flu Vaccine Result Date:  06/16/2008 Flu Vaccine Result:  refused Flu Vaccine Next Due:  Refused Herpes Zoster Result Date:  06/16/2008 Herpes Zoster Result:  refused Herpes Zoster Next Due:  Refused Last Hemoccult Result: Done. (05/24/2001 12:00:00 AM) Hemoccult Next Due:  Not Indicated   PCP:  Victorino Dike HOBBS MD  Chief Complaint:  CPE/follow up medical problems.  History of Present Illness: 65 yo AA female here for FU HTN/CPE  and for refills  1. HTN - no SOB, CP, HA. BP today is a little elevated.  2. Obesity - Stopped exercising because just had left rotator cuff surgery. Is in PT. Would like to start exercising again. She feels that exercising 30 min 3 times a week by walking is something she could manage. Husband also walks every day but says he walks to early in the AM.    3. Wants refill on prempro. Decreased at last appointment to lower dose. She is doing pretty well on this amt. Would be willing to try this every other day as she knows we would eventually like for her to stop taking this.   4. Insomnia - only needing Lunesta occasionally.   5. Vertigo - Much improved. Not needing antivert or phenergan now.   Needs Rx on everything because she sends them all off in the mail.   .     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,     lichen sclerosis et atrophicus (vaginal rash),     panic attacks,    tennis elbow    Rotator cuff  tear - both shoulders    HTN    Obesity   Past Surgical History:    Cholecystectomy - 02/21/1985,     colonoscopy -diverticulosis - 05/24/2004,     ovarian cyst removal - 02/21/1986    Rotator cuff repair - Left shoulder   Family History:    Mother alive with 64 DM, CHF, may need pacepaker    Father died at 28 of MI, had DM, renal failure, blindness;     Sister with breast ca in her 71`s, has a defibrillator;     sister with HTN;     daughter with asthma,   Social History:    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  Moved to [place] from [place] 65 yo to be near mother.  2 daughters, Ezequiel Essex, and Selena Batten, granddaughter, Mel Almond.  15 pack-yr tob hx but currently doesn't smoke.  No drugs or alcohol. Recently married and then separated. Has living will. Daughter and sister have vertigo. Living with husband of 7 years.      Physical Exam  General:  alert, NAD, obese Eyes:     pupils equal, pupils round, and pupils reactive to light.  EOMI. No scleral icterus Ears:     External ear exam shows no significant lesions or deformities.  Otoscopic examination reveals clear canals, tympanic membranes are intact bilaterally without bulging, retraction, inflammation or discharge. Mouth:     Oral mucosa and oropharynx without lesions or exudates.  Teeth in good repair. Neck:     No deformities, masses, or tenderness noted. Lungs:     Normal respiratory effort, chest expands symmetrically. Lungs are clear to auscultation, no crackles or wheezes. Heart:     Normal rate and regular rhythm. S1 and S2 normal Slight 1-2/6 systolic murmur RUSB.  Abdomen:     soft, non-tender, no distention, and no masses.   Extremities:     No pedal edema Neurologic:     alert & oriented X3 and cranial nerves II-XII grossly intact.   Skin:     Intact without suspicious lesions or rashes Psych:     good eye contact, not anxious appearing, and not depressed appearing.       Impression & Recommendations:  Problem # 1:  HYPERTENSION, BENIGN SYSTEMIC (ICD-401.1) Assessment: Deteriorated Slightly worse today but borderline. Will continue HCTZ, watch diet, increase exercise, decrease salt intake. Consider starting ACEI if still elevated at next apt.   Her updated medication list for this problem includes:    Hydrochlorothiazide 25 Mg Tabs (Hydrochlorothiazide) .Marland Kitchen... Take one (1) by mouth daily  Orders: [facility] - Est  40-64 yrs ([address])   Problem # 2:  VERTIGO (ICD-780.4) Assessment: Improved Almost completely gone. Not needing below meds now.   The following medications were removed from the medication list:    Antivert 50 Mg Tabs (Meclizine hcl) .Marland Kitchen... Take one tablet as needed for vertigo    Promethazine Hcl 25 Mg Tabs (Promethazine hcl) ..... One tablet every 4-6 hours prn  Orders: [facility] - Est  40-64 yrs ([address])   Problem # 3:  MENOPAUSAL SYNDROME (ICD-627.2) Assessment: Unchanged Doing ok on lower dose. Will try to taper off as tolerated.   Her updated medication list for this problem includes:    Prempro 0.3-1.5 Mg Tabs (Conj estrog-medroxyprogest ace) .Marland Kitchen... Take one tablet by mouth daily  Orders: [facility] - Est  40-64 yrs ([address])   Problem # 4:  OBESITY, NOS (ICD-278.00) Assessment: Improved Lost 5 pounds. Discussed exercise, weight loss, diet plan. Not interested in bariatric surgery at this time.  Orders: [facility] - Est  40-64 yrs ([address])   Problem # 5:  PHYSICAL EXAMINATION (ICD-V70.0) Tetanus shot today. Due for pap, patient to return to have this done if due. Will look in record and see when her last pap was. Has never had an abnormal pap per patient so may be able to space out to every 3 years. Needs FLP and CMET at next appointment.  Orders: [facility] - Est  40-64 yrs ([address])   Complete Medication List: 1)  Prempro 0.3-1.5 Mg Tabs (Conj estrog-medroxyprogest ace) .... Take one tablet by mouth daily 2)  Hydrochlorothiazide 25 Mg Tabs  (Hydrochlorothiazide) .... Take one (1) by mouth daily 3)  Lunesta 3 Mg Tabs (Eszopiclone) .... One tablet q hs as needed sleep 4)  Midrin 325-65-100 Mg Caps (Apap-isometheptene-dichloral) .... Take one tablet by mouth as needed for headache 5)  Valtrex 500 Mg Tabs (Valacyclovir hcl) .... Take one tablet two times a day for 3 days if have recurrence   Patient Instructions: 1)  Eat  lots of fruits and vegetables. Watch out for saturated fats and cookes/cake especially over the hollidays. 2)  Try to exercise 30 minutes at least 3 times a week.  3)  Maybe exercise with you husband once every 1-2 weeks. 4)  Come back to see [place] in 3 months so we can see how the weight loss is going and check you blood pressure again. 5)  Watch out for salt - this will increase your blood pressure. 6)  You can either come in early one morning to have your labs drawn or wait until your next appointment to have them checked.    Prescriptions: VALTREX 500 MG  TABS (VALACYCLOVIR HCL) Take one tablet two times a day for 3 days if have recurrence  #12 x 2   Entered and Authorized by:   Cyndia Bent MD   Signed by:   Cyndia Bent MD on 06/16/2008   Method used:   Print then Give to Patient   RxID:   [id number] MIDRIN 325-65-100 MG  CAPS (APAP-ISOMETHEPTENE-DICHLORAL) Take one tablet by mouth as needed for headache  #20 x 6   Entered and Authorized by:   Cyndia Bent MD   Signed by:   Cyndia Bent MD on 06/16/2008   Method used:   Print then Give to Patient   RxID:   [id number] LUNESTA 3 MG  TABS (ESZOPICLONE) One tablet q hs as needed sleep  #30 x 2   Entered and Authorized by:   Cyndia Bent MD   Signed by:   Cyndia Bent MD on 06/16/2008   Method used:   Print then Give to Patient   RxID:   [id number] HYDROCHLOROTHIAZIDE 25 MG TABS (HYDROCHLOROTHIAZIDE) Take one (1) by mouth daily  #30 x 11   Entered and Authorized by:   Cyndia Bent MD   Signed by:   Cyndia Bent MD on  06/16/2008   Method used:   Print then Give to Patient   RxID:   [id number] PREMPRO 0.3-1.5 MG  TABS (CONJ ESTROG-MEDROXYPROGEST ACE) Take one tablet by mouth daily  #30 x 11   Entered and Authorized by:   Cyndia Bent MD   Signed by:   Cyndia Bent MD on 06/16/2008   Method used:   Print then Give to Patient   RxID:   [id number] VALTREX 500 MG  TABS (VALACYCLOVIR HCL) Take one tablet two times a day for 3 days if have recurrence  #12 x 2   Entered and Authorized by:   Cyndia Bent MD   Signed by:   Cyndia Bent MD on 06/16/2008   Method used:   Print then Give to Patient   RxID:   [id number] MIDRIN 325-65-100 MG  CAPS (APAP-ISOMETHEPTENE-DICHLORAL) Take one tablet by mouth as needed for headache  #20 x 6   Entered and Authorized by:   Cyndia Bent MD   Signed by:   Cyndia Bent MD on 06/16/2008   Method used:   Print then Give to Patient   RxID:   [id number] LUNESTA 3 MG  TABS (ESZOPICLONE) One tablet q hs as needed sleep  #30 x 2   Entered and Authorized by:   Cyndia Bent MD   Signed by:   Cyndia Bent MD on 06/16/2008   Method used:   Print then Give to Patient   RxID:   [id number] HYDROCHLOROTHIAZIDE 25 MG TABS (HYDROCHLOROTHIAZIDE) Take one (1) by mouth daily  #30 x 11   Entered and Authorized by:   Cyndia Bent  MD   Signed by:   Cyndia Bent MD on 06/16/2008   Method used:   Print then Give to Patient   RxID:   [id number] PREMPRO 0.3-1.5 MG  TABS (CONJ ESTROG-MEDROXYPROGEST ACE) Take one tablet by mouth daily  #30 x 11   Entered and Authorized by:   Cyndia Bent MD   Signed by:   Cyndia Bent MD on 06/16/2008   Method used:   Print then Give to Patient   RxID:   [id number]  ]  Appended Document: cpe/kh/ HTN    Clinical Lists Changes  Observations: Added new observation of PAP DUE: 06/06/2009 (06/16/2008 16:29) Added new observation of LAST PAP DAT: 06/06/2006 (06/06/2006 16:30) Added new  observation of PAP SMEAR: normal (06/06/2006 16:30)      Last PAP:  Done. (05/29/2005 12:00:00 AM) PAP Result Date:  06/06/2006 PAP Result:  normal PAP Next Due:  3 yr

## 2010-08-25 NOTE — Assessment & Plan Note (Signed)
Summary: follow up er/el   Vital Signs:  Patient Profile:   65 Years Old Female Weight:      220 pounds Temp:     98.3 degrees F Pulse rate:   90 / minute BP sitting:   135 / 76  Pt. in pain?   no  Vitals Entered By: Jone Baseman CMA (April 11, 2007 3:20 PM)                  PCP:  Alanda Amass MD  Chief Complaint:  er f/u dizziness.  History of Present Illness: 65 yo AA female here to F/u recent ED visit for vertigo.   Seen in ED on 04/06/07 for episode of vertigo that was worse than usual. Involved vomiting and severe dizziness - things spinning. Given Valium, phenergan, and Antivert in ED per patient. Per patient they did an EKG that was ok but there is not EKG mentioned in the ED note. No labs were drawn. She is feeling much better now every day. Still needing to take the Meclizine at times. She will walk 9-10 steps and then feel dizzy. Denies CP, SOB. Has also had a dull headache but has a history of headaches and this one is not very bad. Goes away with tylenol. Denies numbness, tingling, weakness, vision changes, hearing problems, palpitations, and edema. Her daughter and sister also have vertigo. She denies ear pain and recent congestion.   Current Allergies (reviewed today): No known allergies    Social History:    Works as Charity fundraiser at [employer].  Moved to [place] from [place] 65 yo to be near mother.  2 daughters, Ezequiel Essex (20), and Selena Batten, granddaughter, Mel Almond.  15 pack-yr tob hx but currently doesn't smoke.  No drugs or alcohol. Recently married and then separated. Has living will. Daughter and sister have vertigo     Physical Exam  General:     alert, well-developed, well-nourished, and well-hydrated.  obese.  Head:     normocephalic and atraumatic.   Eyes:     pupils equal, pupils round, and pupils reactive to light.  EOMI Ears:     External ear exam shows no significant lesions or deformities.  Otoscopic examination reveals clear canals, tympanic  membranes are intact bilaterally without bulging, retraction, inflammation or discharge. Hearing is grossly normal bilaterally. Nose:     no external deformity and no nasal discharge.   Mouth:     pharynx pink and moist, no erythema, and no exudates.   Lungs:     normal respiratory effort, normal breath sounds, no crackles, and no wheezes.   Heart:     normal rate, regular rhythm, no murmur, no gallop, and no rub.   Abdomen:     soft, non-tender, no distention, and no masses.   Msk:     normal ROM.  5/5 strength all extremities Extremities:     No edema Neurologic:     alert & oriented X3, cranial nerves II-XII intact, and strength normal in all extremities.   Psych:     Oriented X3, normally interactive, and good eye contact.      Impression & Recommendations:  Problem # 1:  VERTIGO (ICD-780.4) Assessment: Improved Seems to be improving from ED visit. Will continue to monitor. Can continue to take Antivert and Phenergan as needed but should not drive if takes these. Neurologic exam shows no focal deficits. If continues to have events like this may need to Valium or increasing dose of current medications.  Advised that if she notices anything unsual to return to the office.   [place] precepted  Her updated medication list for this problem includes:    Antivert 50 Mg Tabs (Meclizine hcl)    Promethazine Hcl 25 Mg Tabs (Promethazine hcl) ..... One tablet every 4-6 hours prn  Orders: [facility]- Est Level  3 ([address])   Problem # 2:  HYPERTENSION, BENIGN SYSTEMIC (ICD-401.1) Assessment: Unchanged Blood pressure well controlled. Continue HCTZ.   Her updated medication list for this problem includes:    Hydrochlorothiazide 25 Mg Tabs (Hydrochlorothiazide) .Marland Kitchen... Take one (1) by mouth daily  Orders: [facility]- Est Level  3 ([address])   Complete Medication List: 1)  Prempro 0.45-1.5 Mg Tabs (Conj estrog-medroxyprogest ace) .... Take one tablet by mouth daily 2)  Hydrochlorothiazide 25 Mg  Tabs (Hydrochlorothiazide) .... Take one (1) by mouth daily 3)  Lunesta 3 Mg Tabs (Eszopiclone) .... One tablet q hs as needed sleep 4)  Midrin 325-65-100 Mg Caps (Apap-isometheptene-dichloral) .... Take one tablet by mouth as needed for headache 5)  Valtrex 500 Mg Tabs (Valacyclovir hcl) .... Take one tablet two times a day for 3 days if have recurrence 6)  Antivert 50 Mg Tabs (Meclizine hcl) 7)  Promethazine Hcl 25 Mg Tabs (Promethazine hcl) .... One tablet every 4-6 hours prn   Patient Instructions: 1)  Please return if noticing an increasing number of dizzy spells.  2)  If notice any unusual numbness, tingling, weakness, change in your headaches, or anything else that seems unusal please return as well. 3)  We will continue using the meclizine as needed for your vertigo. 4)  Do not drive when taking Meclizine and/or Phenergan.    ]

## 2010-08-25 NOTE — Progress Notes (Signed)
Summary: Rx's  Medications Added PREMPRO 0.45-1.5 MG  TABS (CONJ ESTROG-MEDROXYPROGEST ACE) Take one tablet by mouth daily PREMPRO 0.45-1.5 MG  TABS (CONJ ESTROG-MEDROXYPROGEST ACE) Take one tablet by mouth daily HYDROCHLOROTHIAZIDE 25 MG TABS (HYDROCHLOROTHIAZIDE) Take one (1) by mouth daily      Allergies Added: NKDA Phone Note Call from Patient Call back at Home Phone [phone]   Summary of Call: needs new rx's for all prescriptions Dr Melynda Ripple has prescribed - needs 2 rx's for prempro Initial call taken by: Haydee Salter,  February 07, 2007 9:19 AM  Follow-up for Phone Call        I have not seen this patient since 11/07. She needs to be seen at least every 6 months if her blood pressure is well controlled and more often than that if it is not well controlled. I will refill the prescriptions, but only if she makes an appoinment with me and I will only give her enough to last until  that appointment. Please ask for a list of all the medications she wants refilled besides the Pempro and let me know when her appointment will be so I know how much medicine to prescribe. Thanks. Follow-up by: Alanda Amass MD,  February 08, 2007 8:48 PM  Additional Follow-up for Phone Call Additional follow up Details #1::        would like to speak with someone about this Additional Follow-up by: Haydee Salter,  February 11, 2007 8:48 AM    Additional Follow-up for Phone Call Additional follow up Details #2::    LMOVM requesting return call Follow-up by: [facility] CMA,  February 11, 2007 11:57 AM  Additional Follow-up for Phone Call Additional follow up Details #3:: Details for Additional Follow-up Action Taken: returning call Additional Follow-up by: Haydee Salter,  February 11, 2007 2:19 PM  New/Updated Medications: PREMPRO 0.45-1.5 MG  TABS (CONJ ESTROG-MEDROXYPROGEST ACE) Take one tablet by mouth daily PREMPRO 0.45-1.5 MG  TABS (CONJ ESTROG-MEDROXYPROGEST ACE) Take one tablet by mouth  daily HYDROCHLOROTHIAZIDE 25 MG TABS (HYDROCHLOROTHIAZIDE) Take one (1) by mouth daily Appt made for 9.2.08 (first avaliable), Pt needs 2 Rx's for Prempo ( one for mail order which takes 21 days and one for the local pharmacy to get them now) and one for HCTZ.  Advised we would call her back when this has been taken care of. ...................................................................JESSICA FLEEGER CMA  February 11, 2007 3:26 PM  Prescriptions: PREMPRO 0.45-1.5 MG  TABS (CONJ ESTROG-MEDROXYPROGEST ACE) Take one tablet by mouth daily  #30 x 0   Entered and Authorized by:   Alanda Amass MD   Signed by:   Alanda Amass MD on 02/11/2007   Method used:   Print then Give to Patient   RxID:   [id number] HYDROCHLOROTHIAZIDE 25 MG TABS (HYDROCHLOROTHIAZIDE) Take one (1) by mouth daily  #30 x 1   Entered and Authorized by:   Alanda Amass MD   Signed by:   Alanda Amass MD on 02/11/2007   Method used:   Electronically sent to ...       [facility]       [address]       [place], [place]  [address]       Ph: [phone]       Fax: [phone]   RxID:   [id number] PREMPRO 0.45-1.5 MG  TABS (CONJ ESTROG-MEDROXYPROGEST ACE) Take one tablet by mouth daily  #30 x 0   Entered  and Authorized by:   Alanda Amass MD   Signed by:   Alanda Amass MD on 02/11/2007   Method used:   Electronically sent to ...       [facility]       [address]       [place], [place]  [address]       Ph: [phone]       Fax: [phone]   RxID:   [id number]

## 2010-08-25 NOTE — Consult Note (Signed)
Summary: [facility]  [facility]   Imported By: Clydell Hakim 07/14/2009 16:31:07  _____________________________________________________________________  External Attachment:    Type:   Image     Comment:   External Document

## 2010-09-23 ENCOUNTER — Telehealth: Payer: Self-pay | Admitting: Family Medicine

## 2010-09-23 MED ORDER — ESZOPICLONE 3 MG PO TABS: 3.0000 mg | ORAL_TABLET | Freq: Every day | ORAL | Status: DC

## 2010-09-23 NOTE — Telephone Encounter (Signed)
Called pt, informed rx ready for p/u.

## 2010-09-23 NOTE — Telephone Encounter (Signed)
Pt requesting hand written rx for lunesta

## 2010-09-23 NOTE — Telephone Encounter (Signed)
Please let patient know if at front office for pickup

## 2010-11-17 ENCOUNTER — Inpatient Hospital Stay (INDEPENDENT_AMBULATORY_CARE_PROVIDER_SITE_OTHER)
Admission: RE | Admit: 2010-11-17 | Discharge: 2010-11-17 | Disposition: A | Discharge status: Home / Self Care | Source: Ambulatory Visit | Attending: Emergency Medicine | Admitting: Emergency Medicine

## 2010-11-17 DIAGNOSIS — J019 Acute sinusitis, unspecified: Secondary | ICD-10-CM

## 2010-11-17 DIAGNOSIS — J40 Bronchitis, not specified as acute or chronic: Secondary | ICD-10-CM

## 2010-11-18 ENCOUNTER — Ambulatory Visit: Payer: Self-pay | Admitting: Family Medicine

## 2010-11-24 ENCOUNTER — Other Ambulatory Visit: Payer: Self-pay | Admitting: Orthopedic Surgery

## 2010-11-24 ENCOUNTER — Other Ambulatory Visit: Payer: Self-pay | Admitting: Family Medicine

## 2010-11-24 DIAGNOSIS — Z1231 Encounter for screening mammogram for malignant neoplasm of breast: Secondary | ICD-10-CM

## 2010-11-24 DIAGNOSIS — M25551 Pain in right hip: Secondary | ICD-10-CM

## 2010-11-24 DIAGNOSIS — M545 Low back pain, unspecified: Secondary | ICD-10-CM

## 2010-11-24 DIAGNOSIS — M25552 Pain in left hip: Secondary | ICD-10-CM

## 2010-12-05 ENCOUNTER — Other Ambulatory Visit: Payer: Self-pay | Admitting: Orthopedic Surgery

## 2010-12-05 ENCOUNTER — Other Ambulatory Visit

## 2010-12-05 ENCOUNTER — Ambulatory Visit
Admission: RE | Admit: 2010-12-05 | Discharge: 2010-12-05 | Disposition: A | Discharge status: Home / Self Care | Source: Ambulatory Visit | Attending: Orthopedic Surgery | Admitting: Orthopedic Surgery

## 2010-12-05 DIAGNOSIS — M25552 Pain in left hip: Secondary | ICD-10-CM

## 2010-12-05 DIAGNOSIS — M25551 Pain in right hip: Secondary | ICD-10-CM

## 2010-12-05 DIAGNOSIS — M545 Low back pain, unspecified: Secondary | ICD-10-CM

## 2010-12-09 NOTE — Op Note (Signed)
Angie [name], Angie [name]        ACCOUNT NO.:  [id number]   MEDICAL RECORD NO.:  [id number]          PATIENT TYPE:  AMB   LOCATION:  ENDO                         FACILITY:  [facility]   PHYSICIAN:  Anselmo Rod, M.D.  DATE OF BIRTH:  06/21/1946   DATE OF PROCEDURE:  06/06/2004  DATE OF DISCHARGE:                                 OPERATIVE REPORT   PROCEDURE PERFORMED:  Colonoscopy with cold biopsies times two.   ENDOSCOPIST:  Charna Elizabeth, M.D.   INSTRUMENT USED:  Olympus video colonoscope.   INDICATIONS FOR PROCEDURE:  The patient is a 65 year old African-American  female undergoing screening colonoscopy to rule out colonic polyps, masses,  etc.   PREPROCEDURE PREPARATION:  Informed consent was procured from the patient.  The patient was fasted for eight hours prior to the procedure and prepped  with a bottle of magnesium citrate and a gallon of GoLYTELY the night prior  to the procedure.   PREPROCEDURE PHYSICAL:  The patient had stable vital signs.  Neck supple.  Chest clear to auscultation.  S1 and S2 regular.  Abdomen soft with normal  bowel sounds.   DESCRIPTION OF PROCEDURE:  The patient was placed in left lateral decubitus  position and sedated with 80 mg of Demerol and 10 mg of Versed in slow  incremental doses.  Once the patient was adequately sedated and maintained  on low flow oxygen and continuous cardiac monitoring, the Olympus video  colonoscope was advanced from the rectum to the cecum.  The appendicular  orifice and ileocecal valve were clearly visualized and photographed.  The  patient had extensive diverticulosis throughout the colon with inspissated  stool in several of the diverticula, especially on the left side.  Small  internal hemorrhoids were seen on retroflexion.  Two small sessile polyps  were biopsied from the rectosigmoid colon.  The cecum and ileocecal valve  were clearly visualized and photographed.  There was a significant amount of  residual  stool, especially in the right colon.  Multiple washes were done.  The patient tolerated the procedure well without immediate complications.   IMPRESSION:  1.  Pandiverticulosis with more prominent change in the left side.  2.  Small internal hemorrhoids.  3.  Two small sessile polyps biopsied from the rectosigmoid colon.   RECOMMENDATIONS:  1.  Await pathology results.  2.  Avoid all nonsteroidals including aspirin for the next four weeks.  3.  Outpatient followup in the next two weeks for further recommendations.       JNM/MEDQ  D:  06/06/2004  T:  06/06/2004  Job:  [id number]   cc:   Sibyl Parr. Darrick Penna, M.D.  Fax: [phone]

## 2010-12-26 ENCOUNTER — Other Ambulatory Visit: Payer: Self-pay | Admitting: Family Medicine

## 2010-12-26 NOTE — Telephone Encounter (Signed)
Pt asking for handwritten rx for HCTZ, Imitrex, Valtrex and Lunesta, pt uses mail order pharmacy & will mail them herself.

## 2010-12-27 ENCOUNTER — Telehealth: Payer: Self-pay | Admitting: Family Medicine

## 2010-12-27 MED ORDER — VALACYCLOVIR HCL 500 MG PO TABS: 500.0000 mg | ORAL_TABLET | Freq: Two times a day (BID) | ORAL | Status: DC

## 2010-12-27 MED ORDER — SUMATRIPTAN SUCCINATE 25 MG PO TABS: 25.0000 mg | ORAL_TABLET | ORAL | Status: DC | PRN

## 2010-12-27 MED ORDER — HYDROCHLOROTHIAZIDE 25 MG PO TABS: 25.0000 mg | ORAL_TABLET | Freq: Every day | ORAL | Status: DC

## 2010-12-27 NOTE — Telephone Encounter (Signed)
[name] will not be able to see you this month due to your schedule not available before she leaves out of town.  She said she spoke with you about availability of an appt and was told if she would not be able to get in before you leave that you would refill her rxs.  She is in need of her lipitor, valacyclovir, hctz, and her imitrex.

## 2010-12-27 NOTE — Telephone Encounter (Signed)
Patient has not been seen since June 2011, and is due for physical.  Please have her schedule appt and will refill year supply of medication.  If she needs a month supply to get her to next appt, please let me know and I would be glad to fill.

## 2010-12-27 NOTE — Telephone Encounter (Signed)
Spoke with patient and informed of below, se is going to call back to schedule appointment

## 2010-12-27 NOTE — Telephone Encounter (Signed)
I have no spoken with Angie [name] so I am nto sure where she got this information from.  I have placed refills in front office for pickup-.  Please schedule her a follow-up appt at her next convenience.  FYI: I did not fill lipitor as I have no record of her being on this medication

## 2010-12-28 ENCOUNTER — Other Ambulatory Visit: Payer: Self-pay | Admitting: Orthopedic Surgery

## 2010-12-28 DIAGNOSIS — M545 Low back pain, unspecified: Secondary | ICD-10-CM

## 2010-12-28 DIAGNOSIS — M25551 Pain in right hip: Secondary | ICD-10-CM

## 2010-12-29 ENCOUNTER — Ambulatory Visit (HOSPITAL_COMMUNITY)

## 2010-12-29 ENCOUNTER — Other Ambulatory Visit: Payer: Self-pay | Admitting: Family Medicine

## 2010-12-29 DIAGNOSIS — Z1231 Encounter for screening mammogram for malignant neoplasm of breast: Secondary | ICD-10-CM

## 2010-12-29 NOTE — Telephone Encounter (Signed)
Called patient to pick up meds

## 2011-01-12 ENCOUNTER — Ambulatory Visit (HOSPITAL_COMMUNITY)

## 2011-01-20 ENCOUNTER — Ambulatory Visit (INDEPENDENT_AMBULATORY_CARE_PROVIDER_SITE_OTHER): Admitting: Family Medicine

## 2011-01-20 DIAGNOSIS — I10 Essential (primary) hypertension: Secondary | ICD-10-CM

## 2011-01-20 DIAGNOSIS — G2581 Restless legs syndrome: Secondary | ICD-10-CM

## 2011-01-20 DIAGNOSIS — R7301 Impaired fasting glucose: Secondary | ICD-10-CM

## 2011-01-20 LAB — TSH: TSH: 1.424 u[IU]/mL (ref 0.350–4.500)

## 2011-01-20 LAB — LIPID PANEL
Cholesterol: 256 mg/dL — ABNORMAL HIGH (ref 0–200)
HDL: 67 mg/dL (ref >39)
LDL Cholesterol: 167 mg/dL — ABNORMAL HIGH (ref 0–99)
Total CHOL/HDL Ratio: 3.8 Ratio
Triglycerides: 111 mg/dL (ref <150)
VLDL: 22 mg/dL (ref 0–40)

## 2011-01-20 LAB — COMPREHENSIVE METABOLIC PANEL
ALT: 23 U/L (ref 0–35)
AST: 26 U/L (ref 0–37)
Albumin: 4.6 g/dL (ref 3.5–5.2)
Alkaline Phosphatase: 76 U/L (ref 39–117)
BUN: 20 mg/dL (ref 6–23)
CO2: 31 mEq/L (ref 19–32)
Calcium: 10 mg/dL (ref 8.4–10.5)
Chloride: 102 mEq/L (ref 96–112)
Creat: 0.78 mg/dL (ref 0.50–1.10)
Glucose, Bld: 110 mg/dL — ABNORMAL HIGH (ref 70–99)
Potassium: 4 mEq/L (ref 3.5–5.3)
Sodium: 142 mEq/L (ref 135–145)
Total Bilirubin: 0.6 mg/dL (ref 0.3–1.2)
Total Protein: 6.8 g/dL (ref 6.0–8.3)

## 2011-01-20 MED ORDER — LORAZEPAM 0.5 MG PO TABS: 0.5000 mg | ORAL_TABLET | Freq: Every evening | ORAL | Status: DC | PRN

## 2011-01-20 NOTE — Patient Instructions (Signed)
It was a pleasure to see you today for your leg crampy pain at nighttime.  We are checking labs today to include an electrolyte panel, a blood count and thyroid test.  I will contact you with the results either by letter or phone.   Please take 1 lorazepam at nighttime when you have the crampy pain. DO NOT TAKE WITH LUNESTA, OR WITH ALCOHOL.  Please keep a diary of the frequency and timing of the pain.  Please make a follow up appointment in the coming 2 weeks.

## 2011-01-20 NOTE — Assessment & Plan Note (Signed)
Fasting glucose as part of today's lab assessment.

## 2011-01-20 NOTE — Progress Notes (Signed)
  Subjective:    Patient ID: Angie [name], female    DOB: January 07, 1946, 65 y.o.   MRN: [id number]  HPI Patient here for same-day appointment for bilateral leg crampy pain that began 2 months ago; started while she had sinus complaints, which have since resolved. Describes as an achy cramp. Started in R leg and has progressed to involve both legs.  Usually in calves, sometimes incorporating the lateral thigh.  No weakness, no report of trauma.   No fevers or chills reported.  Occur on average 3-4 times a week.  Otherwise feels well; last colonoscopy with report of diverticulosis about 3 years ago.  No family history of thyroid disease.    Review of Systems     Objective:   Physical Exam Well appearing, no apparent distress.  HEENT Neck supple, no adenopathy.  Thyroid supple and no palpable nodules.  LEs: full active and passive ROM.  Gait unremarkable.  Calf girth symmetrical, some superficial varicosities without cords.  No other visible skin changes.  Full ROM plantarflexion and dorsiflexion.  Palpable dp pulses bilaterally.        Assessment & Plan:

## 2011-01-20 NOTE — Assessment & Plan Note (Signed)
Patietn on HCTZ, last seen here in October 2010.  To check CMet and lipid panel today.  Follow up in 2 weeks.  BP controlled today.

## 2011-01-20 NOTE — Assessment & Plan Note (Signed)
Patient with crampy nocturnal leg pain, which she describes as mostly concentrated on calves, and which may have some relief with movement.  Not entirely consistent with RLS, but leads my differential today. To check CBC and TSH, also to check lytes as she has been on HCTZ without recent lab monitoring.  Follow up in 2 weeks.  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  Does not drink alcohol.  Is counseled to stop Lunesta if she takes the Ativan for presumed RLS.

## 2011-01-21 LAB — CBC
HCT: 41.8 % (ref 36.0–46.0)
Hemoglobin: 13.7 g/dL (ref 12.0–15.0)
MCH: 29.7 pg (ref 26.0–34.0)
MCHC: 32.8 g/dL (ref 30.0–36.0)
MCV: 90.5 fL (ref 78.0–100.0)
Platelets: 244 10*3/uL (ref 150–400)
RBC: 4.62 MIL/uL (ref 3.87–5.11)
RDW: 14.9 % (ref 11.5–15.5)
WBC: 7.1 10*3/uL (ref 4.0–10.5)

## 2011-01-23 ENCOUNTER — Encounter: Payer: Self-pay | Admitting: Family Medicine

## 2011-01-27 ENCOUNTER — Other Ambulatory Visit: Payer: Self-pay | Admitting: Family Medicine

## 2011-01-27 DIAGNOSIS — Z1231 Encounter for screening mammogram for malignant neoplasm of breast: Secondary | ICD-10-CM

## 2011-01-30 ENCOUNTER — Ambulatory Visit (HOSPITAL_COMMUNITY)
Admission: RE | Admit: 2011-01-30 | Discharge: 2011-01-30 | Disposition: A | Discharge status: Home / Self Care | Source: Ambulatory Visit | Attending: Family Medicine | Admitting: Family Medicine

## 2011-01-30 DIAGNOSIS — Z1231 Encounter for screening mammogram for malignant neoplasm of breast: Secondary | ICD-10-CM | POA: Insufficient documentation

## 2011-02-06 ENCOUNTER — Ambulatory Visit
Admission: RE | Admit: 2011-02-06 | Discharge: 2011-02-06 | Disposition: A | Discharge status: Home / Self Care | Source: Ambulatory Visit | Attending: Orthopedic Surgery | Admitting: Orthopedic Surgery

## 2011-02-06 ENCOUNTER — Other Ambulatory Visit: Payer: Self-pay | Admitting: Orthopedic Surgery

## 2011-02-06 DIAGNOSIS — M25551 Pain in right hip: Secondary | ICD-10-CM

## 2011-02-06 DIAGNOSIS — M545 Low back pain, unspecified: Secondary | ICD-10-CM

## 2011-02-06 MED ORDER — METHYLPREDNISOLONE ACETATE 40 MG/ML INJ SUSP (RADIOLOG
120.0000 mg | Freq: Once | INTRAMUSCULAR | Status: AC
  Administered 2011-02-06: 120 mg via EPIDURAL

## 2011-02-06 MED ORDER — IOHEXOL 180 MG/ML  SOLN
1.0000 mL | Freq: Once | INTRAMUSCULAR | Status: AC | PRN
  Administered 2011-02-06: 1 mL via EPIDURAL

## 2011-02-06 NOTE — Discharge Instructions (Signed)
Post Procedure Spinal Discharge Instruction Sheet ° °1. You may resume a regular diet and any medications that you routinely take (including pain medications). ° °2. No driving day of procedure. ° °3. Light activity throughout the rest of the day.  Do not do any strenuous work, exercise, bending or lifting.  The day following the procedure, you can resume normal physical activity but you should refrain from exercising or physical therapy for at least three days thereafter. ° ° °Common Side Effects: ° °· Headaches- take your usual medications as directed by your physician.  Increase your fluid intake.  Caffeinated beverages may be helpful.  Lie flat in bed until your headache resolves. ° °· Restlessness or inability to sleep- you may have trouble sleeping for the next few days.  Ask your referring physician if you need any medication for sleep. ° °· Facial flushing or redness- should subside within a few days. ° °· Increased pain- a temporary increase in pain a day or two following your procedure is not unusual.  Take your pain medication as prescribed by your referring physician. ° °· Leg cramps ° °Please contact our office at [phone] for the following symptoms: °· Fever greater than 100 degrees. °· Headaches unresolved with medication after 2-3 days. °· Increased swelling, pain, or redness at injection site. ° °Thank you for visiting our office. °

## 2011-02-07 ENCOUNTER — Ambulatory Visit (INDEPENDENT_AMBULATORY_CARE_PROVIDER_SITE_OTHER): Admitting: Family Medicine

## 2011-02-07 ENCOUNTER — Encounter: Payer: Self-pay | Admitting: Family Medicine

## 2011-02-07 VITALS — BP 131/77 | HR 74 | Temp 98.4°F | Ht 63.0 in | Wt 219.8 lb

## 2011-02-07 DIAGNOSIS — G2581 Restless legs syndrome: Secondary | ICD-10-CM

## 2011-02-07 DIAGNOSIS — N951 Menopausal and female climacteric states: Secondary | ICD-10-CM

## 2011-02-07 DIAGNOSIS — I10 Essential (primary) hypertension: Secondary | ICD-10-CM

## 2011-02-07 DIAGNOSIS — G44209 Tension-type headache, unspecified, not intractable: Secondary | ICD-10-CM

## 2011-02-07 DIAGNOSIS — E785 Hyperlipidemia, unspecified: Secondary | ICD-10-CM

## 2011-02-07 MED ORDER — GABAPENTIN 300 MG PO CAPS: 600.0000 mg | ORAL_CAPSULE | Freq: Every day | ORAL | Status: DC

## 2011-02-07 MED ORDER — ESZOPICLONE 3 MG PO TABS: 3.0000 mg | ORAL_TABLET | Freq: Every day | ORAL | Status: DC

## 2011-02-07 MED ORDER — HYDROCHLOROTHIAZIDE 25 MG PO TABS: 25.0000 mg | ORAL_TABLET | Freq: Every day | ORAL | Status: DC

## 2011-02-07 MED ORDER — SUMATRIPTAN SUCCINATE 25 MG PO TABS: 25.0000 mg | ORAL_TABLET | ORAL | Status: DC | PRN

## 2011-02-07 MED ORDER — VALACYCLOVIR HCL 500 MG PO TABS: 500.0000 mg | ORAL_TABLET | Freq: Two times a day (BID) | ORAL | Status: DC

## 2011-02-07 NOTE — Assessment & Plan Note (Signed)
Discussed alternative treatments for hot flashes, none with strong evidence. Failed black cohosh. Patient desires to continue gabapentin currently. May also try chickroot tea.

## 2011-02-07 NOTE — Assessment & Plan Note (Signed)
Last FLP was not truly fasting. Will obtain LDL today and consider initiating statin therapy if >130. Advised regarding lifestyle modifications.

## 2011-02-07 NOTE — Progress Notes (Signed)
  Subjective:    Patient ID: Angie [name], female    DOB: [birth year], 65 y.o.   MRN: [id number]  HPI  1. Hotflashes. In perimenopausal period, irregular cycles and hot flashes daily. Has tried black cohosh (ineffective) and now on neurontin qhs. Does not think this is helping very much, but she did stop for few days and felt the hotflashes worsened. Prefers nonhormonal methods.   2. Headaches. Last was 2 months ago and imitrex is effective abortive therapy. Requesting refill on this and all medications today.  3. HTN. Does not check at home. Taking HCTZ and needs refill today.   4. Herpes zoster. Has "outbreaks" on left torso every few months. Had zoster vaccine that hasn't made significant difference. Taking valtrex intermittently when symptoms begin.  None currently.   5. Leg pain. Evaluated by PMR physician. SI joint injections did not help pain. She underwent epidural steroid injection yesterday. Has not felt a significant improvement yet. Pain located in bilateral thighs. No weakness, incontinence.   6. Hx positive PPD. Patient requests chest x-ray because "it has been awhile" since her last one. Her request is because she has history of positive PPD testing in years past. No cough, dyspnea, fevers, weight loss or known TB exposure.  Review of Systems Denies dyspnea, cough, LE swelling, fevers, chest pain.      Objective:   Physical Exam  Vitals reviewed. Constitutional: She is oriented to person, place, and time. She appears well-developed and well-nourished. No distress.  HENT:  Head: Normocephalic and atraumatic.  Cardiovascular: Normal rate, regular rhythm and normal heart sounds.   No murmur heard. Pulmonary/Chest: Effort normal and breath sounds normal. No respiratory distress. She has no wheezes. She has no rales. She exhibits no tenderness.  Musculoskeletal: She exhibits no edema and no tenderness.  Neurological: She is alert and oriented to person, place, and time.  No cranial nerve deficit. She exhibits normal muscle tone. Coordination normal.  Psychiatric: She has a normal mood and affect.          Assessment & Plan:

## 2011-02-07 NOTE — Assessment & Plan Note (Signed)
At goal. Med refill today. Follow up in 18mo.

## 2011-02-07 NOTE — Assessment & Plan Note (Signed)
Infrequent. Pt happy with imitrex for abortive therapy. Ok to use infrequently, may consider alternatives if HTN becomes uncontrolled at any point.

## 2011-02-07 NOTE — Patient Instructions (Signed)
Nice to meet you. i have refilled your medications. You do not need a chest xray unless you have fevers, cough, lose weight or have symptoms of TB. Will call you if your cholesterol is still too high.  Cholesterol Cholesterol is a white, waxy, fat-like protein needed by your body in small amounts. The liver makes all the cholesterol you need. It is carried from the liver by the blood through the blood vessels. Deposits (plaque) may build up on blood vessel walls. This makes the arteries narrower and stiffer. Plaque increases the risk for heart attack and stroke. You cannot feel your cholesterol level even if it is very high. The only way to know is by a blood test to check your lipid (fats) levels. Once you know your cholesterol levels, you should keep a record of the test results. Work with your caregiver to to keep your levels in the desired range. WHAT THE RESULTS MEAN:  Total cholesterol is a rough measure of all the cholesterol in your blood.   LDL is the so-called bad cholesterol. This is the type that deposits cholesterol   in the walls of the arteries. You want this level to be low.   HDL is the good cholesterol because it cleans the arteries and carries the LDL away. You want this level to be high.   Triglycerides are fat that the body can either burn for energy or store. High levels are closely linked to heart disease.  DESIRED LEVELS:  Total cholesterol below 200.   LDL below 100 for people at risk, below 70 for very high risk.   HDL above 50 is good, above 60 is best.   Triglycerides below 150.  HOW TO LOWER YOUR CHOLESTEROL:  Diet.   Choose fish or white meat chicken and [place], roasted or baked. Limit fatty cuts of red meat, fried foods, and processed meats, such as sausage and lunch meat.   Eat lots of fresh fruits and vegetables. Choose whole grains, beans, pasta, potatoes and cereals.   Use only small amounts of olive, corn or canola oils. Avoid butter,  mayonnaise, shortening or palm kernel oils. Avoid foods with trans-fats.   Use skim/nonfat milk and low-fat/nonfat yogurt and cheeses. Avoid whole milk, cream, ice cream, egg yolks and cheeses. Healthy desserts include angel food cake, gingersnaps, animal crackers, hard candy, popsicles, and low-fat/nonfat frozen yogurt. Avoid pastries, cakes, pies and cookies.   Exercise.   A regular program helps decrease LDL and raises HDL.   Helps with weight control.   Do things that increase your activity level like gardening, walking, or taking the stairs.   Medication.   May be prescribed by your caregiver to help lowering cholesterol and the risk for heart disease.   You may need medicine even if your levels are normal if you have several risk factors.  HOME CARE INSTRUCTIONS  Follow your diet and exercise programs as suggested by your caregiver.   Take medications as directed.   Have blood work done when your caregiver feels it is necessary.  MAKE SURE YOU:   Understand these instructions.   Will watch your condition.   Will get help right away if you are not doing well or get worse.  Document Released: 04/04/2001 Document Re-Released: 06/22/2008 [facility] Patient Information 2011 [place], [place].

## 2011-02-07 NOTE — Assessment & Plan Note (Signed)
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 Underwent epidural steroid injection for this, no immediate effect yet.

## 2011-02-27 ENCOUNTER — Other Ambulatory Visit: Payer: Self-pay | Admitting: Family Medicine

## 2011-02-27 DIAGNOSIS — G4700 Insomnia, unspecified: Secondary | ICD-10-CM

## 2011-02-27 MED ORDER — VALACYCLOVIR HCL 500 MG PO TABS: ORAL_TABLET | ORAL | Status: DC

## 2011-02-27 MED ORDER — ESZOPICLONE 3 MG PO TABS: 3.0000 mg | ORAL_TABLET | Freq: Every day | ORAL | Status: DC

## 2011-02-27 NOTE — Telephone Encounter (Signed)
prescription completed, left at front desk

## 2011-02-27 NOTE — Telephone Encounter (Signed)
Pt is requesting one more refill for valtrex, says it must be handwritten so she can send off to the pharmacy.

## 2011-03-13 ENCOUNTER — Telehealth: Payer: Self-pay | Admitting: Family Medicine

## 2011-03-13 NOTE — Telephone Encounter (Signed)
Needs a refill on Valcyclovir that she will send to the [place] for her shingles.  She just got enough for 6 doses, but after that she will be out.  She will need to pick this Rx up.

## 2011-03-14 MED ORDER — VALACYCLOVIR HCL 500 MG PO TABS: ORAL_TABLET | ORAL | Status: DC

## 2011-03-14 NOTE — Telephone Encounter (Signed)
Left note for patient, if continues to use frequently, advised to make office appt at next outbreak to evaluate.  May benefit from diagnosis and shingles dosing instead of HSV dosing.  Please let her know it is ready for pickup

## 2011-03-16 NOTE — Telephone Encounter (Signed)
Left vm for pt to return call, Rx at front desk for p/u.

## 2011-03-30 ENCOUNTER — Other Ambulatory Visit: Payer: Self-pay | Admitting: Orthopedic Surgery

## 2011-03-30 DIAGNOSIS — M549 Dorsalgia, unspecified: Secondary | ICD-10-CM

## 2011-03-31 ENCOUNTER — Ambulatory Visit
Admission: RE | Admit: 2011-03-31 | Discharge: 2011-03-31 | Disposition: A | Discharge status: Home / Self Care | Source: Ambulatory Visit | Attending: Orthopedic Surgery | Admitting: Orthopedic Surgery

## 2011-03-31 DIAGNOSIS — M549 Dorsalgia, unspecified: Secondary | ICD-10-CM

## 2011-03-31 MED ORDER — IOHEXOL 180 MG/ML  SOLN
1.0000 mL | Freq: Once | INTRAMUSCULAR | Status: AC | PRN
  Administered 2011-03-31: 1 mL via EPIDURAL

## 2011-03-31 MED ORDER — METHYLPREDNISOLONE ACETATE 40 MG/ML INJ SUSP (RADIOLOG
120.0000 mg | Freq: Once | INTRAMUSCULAR | Status: AC
  Administered 2011-03-31: 120 mg via EPIDURAL

## 2011-04-11 ENCOUNTER — Ambulatory Visit (INDEPENDENT_AMBULATORY_CARE_PROVIDER_SITE_OTHER): Admitting: Family Medicine

## 2011-04-11 VITALS — BP 138/80 | HR 85 | Wt 218.0 lb

## 2011-04-11 DIAGNOSIS — R21 Rash and other nonspecific skin eruption: Secondary | ICD-10-CM

## 2011-04-11 DIAGNOSIS — B009 Herpesviral infection, unspecified: Secondary | ICD-10-CM | POA: Insufficient documentation

## 2011-04-11 NOTE — Progress Notes (Signed)
  Subjective:    Patient ID: Angie [name], female    DOB: Dec 27, 1945, 65 y.o.   MRN: [id number]  HPI  EMR not accessible during this visit due to network outage.  Here for visit to discuss shingles.  Notes 6 times per year outbreak of what she believes to be shingles.  Starts with a prodromal discomfort "feels tight", then soreness. No tingling sharp pain.  Usually above gluteal cleft area.  Take acyclovir which she feels does help with pain.  Usually a small collection on bumps per patient  Recent outbreak 4 days ago, taking acyclovir.     Review of Systems No fever, n/v, abdominal pain.    Objective:   Physical Exam .GEN: NAD Skin:  Collection of small coalescing pustules in gluteal cleft in about a 1 cm area.  When unroofed to collect culture, no pus or fluid, instead what appeared to be white pustules was just skin.       Assessment & Plan:

## 2011-04-11 NOTE — Assessment & Plan Note (Signed)
Not classically vesicular or ulcers.  Different dermatomes suggests against shingles.  Will obtain herpes simplex and VZV DFA.  Gave triamcinolone cream today for localized itching.  WIll continue to treat acute flares with acyclovir at this time.

## 2011-04-12 ENCOUNTER — Other Ambulatory Visit: Payer: Self-pay | Admitting: Family Medicine

## 2011-04-16 LAB — VIRUS CULTURE

## 2011-04-17 ENCOUNTER — Telehealth: Payer: Self-pay | Admitting: Family Medicine

## 2011-04-18 MED ORDER — VALACYCLOVIR HCL 500 MG PO TABS: 500.0000 mg | ORAL_TABLET | Freq: Every day | ORAL | Status: DC

## 2011-04-18 NOTE — Assessment & Plan Note (Addendum)
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   She opts for daily preventive therapy.  Will send valacyclovir 500 mg qday [facility] [address], [place] [address]  Advised to return in 6-8 weeks for CBC, CMET check.

## 2011-04-18 NOTE — Telephone Encounter (Signed)
Pt returned call - pls call this afternoon.

## 2011-04-19 NOTE — Telephone Encounter (Signed)
Mailed script to [facility], [address], [place], [place] [address]

## 2011-04-26 LAB — WET PREP, GENITAL
Clue Cells Wet Prep HPF POC: NONE SEEN
Trich, Wet Prep: NONE SEEN
WBC, Wet Prep HPF POC: NONE SEEN
Yeast Wet Prep HPF POC: NONE SEEN

## 2011-04-26 LAB — HERPES SIMPLEX VIRUS CULTURE: Culture: NOT DETECTED

## 2011-07-05 ENCOUNTER — Other Ambulatory Visit: Payer: Self-pay | Admitting: Family Medicine

## 2011-07-05 MED ORDER — GABAPENTIN 300 MG PO CAPS: 600.0000 mg | ORAL_CAPSULE | Freq: Every day | ORAL | Status: DC

## 2011-07-05 NOTE — Telephone Encounter (Signed)
Ok to refill as above.

## 2011-07-05 NOTE — Telephone Encounter (Signed)
Pt asking for handwritten rx for gabapentin, says she sends it mail order.

## 2011-07-05 NOTE — Telephone Encounter (Signed)
Patient has not been seen since September, should she make an appointment for this refill

## 2011-07-05 NOTE — Telephone Encounter (Signed)
Addended by: Macy Mis on: 07/05/2011 02:34 PM   Modules accepted: Orders

## 2011-07-05 NOTE — Telephone Encounter (Signed)
Spoke with patient and informed her about rx up front to be picked up

## 2011-07-24 ENCOUNTER — Other Ambulatory Visit: Payer: Self-pay | Admitting: Orthopedic Surgery

## 2011-07-24 DIAGNOSIS — M25551 Pain in right hip: Secondary | ICD-10-CM

## 2011-07-24 DIAGNOSIS — M549 Dorsalgia, unspecified: Secondary | ICD-10-CM

## 2011-07-26 ENCOUNTER — Ambulatory Visit
Admission: RE | Admit: 2011-07-26 | Discharge: 2011-07-26 | Disposition: A | Discharge status: Home / Self Care | Source: Ambulatory Visit | Attending: Orthopedic Surgery | Admitting: Orthopedic Surgery

## 2011-07-26 DIAGNOSIS — M549 Dorsalgia, unspecified: Secondary | ICD-10-CM

## 2011-07-26 DIAGNOSIS — M25551 Pain in right hip: Secondary | ICD-10-CM

## 2011-07-26 MED ORDER — IOHEXOL 180 MG/ML  SOLN
1.0000 mL | Freq: Once | INTRAMUSCULAR | Status: AC | PRN
  Administered 2011-07-26: 1 mL via EPIDURAL

## 2011-07-26 MED ORDER — METHYLPREDNISOLONE ACETATE 40 MG/ML INJ SUSP (RADIOLOG
120.0000 mg | Freq: Once | INTRAMUSCULAR | Status: AC
  Administered 2011-07-26: 120 mg via EPIDURAL

## 2011-07-26 NOTE — Discharge Instructions (Signed)
Post Procedure Spinal Discharge Instruction Sheet ° °1. You may resume a regular diet and any medications that you routinely take (including pain medications). ° °2. No driving day of procedure. ° °3. Light activity throughout the rest of the day.  Do not do any strenuous work, exercise, bending or lifting.  The day following the procedure, you can resume normal physical activity but you should refrain from exercising or physical therapy for at least three days thereafter. ° ° °Common Side Effects: ° °· Headaches- take your usual medications as directed by your physician.  Increase your fluid intake.  Caffeinated beverages may be helpful.  Lie flat in bed until your headache resolves. ° °· Restlessness or inability to sleep- you may have trouble sleeping for the next few days.  Ask your referring physician if you need any medication for sleep. ° °· Facial flushing or redness- should subside within a few days. ° °· Increased pain- a temporary increase in pain a day or two following your procedure is not unusual.  Take your pain medication as prescribed by your referring physician. ° °· Leg cramps ° °Please contact our office at [phone] for the following symptoms: °· Fever greater than 100 degrees. °· Headaches unresolved with medication after 2-3 days. °· Increased swelling, pain, or redness at injection site. ° °Thank you for visiting our office. °

## 2011-07-26 NOTE — Patient Instructions (Signed)

## 2011-08-03 ENCOUNTER — Encounter: Payer: Self-pay | Admitting: Family Medicine

## 2011-08-03 ENCOUNTER — Ambulatory Visit (INDEPENDENT_AMBULATORY_CARE_PROVIDER_SITE_OTHER): Admitting: Family Medicine

## 2011-08-03 VITALS — BP 151/81 | HR 83 | Temp 97.7°F | Ht 63.0 in | Wt 216.0 lb

## 2011-08-03 DIAGNOSIS — E785 Hyperlipidemia, unspecified: Secondary | ICD-10-CM

## 2011-08-03 DIAGNOSIS — I10 Essential (primary) hypertension: Secondary | ICD-10-CM

## 2011-08-03 DIAGNOSIS — R05 Cough: Secondary | ICD-10-CM

## 2011-08-03 DIAGNOSIS — R7301 Impaired fasting glucose: Secondary | ICD-10-CM

## 2011-08-03 DIAGNOSIS — R059 Cough, unspecified: Secondary | ICD-10-CM

## 2011-08-03 MED ORDER — BENZONATATE 100 MG PO CAPS: 100.0000 mg | ORAL_CAPSULE | Freq: Three times a day (TID) | ORAL | Status: AC | PRN

## 2011-08-03 NOTE — Assessment & Plan Note (Signed)
BP elevated today, history of good control  She will check BP's at home, let me know if continues to be above goal.

## 2011-08-03 NOTE — Assessment & Plan Note (Signed)
Will check LDL, anticipate needing to start statin

## 2011-08-03 NOTE — Assessment & Plan Note (Signed)
Resolving post viral cough.  Discussed narcotic cough syrup vs tessalon.  She chose tessalon.  Given red flags for return

## 2011-08-03 NOTE — Assessment & Plan Note (Signed)
Has been borderline last several checks.  Last checked 6 months ago.  Will have her return for fasting blood work

## 2011-08-03 NOTE — Progress Notes (Signed)
  Subjective:    Patient ID: Angie [name], female    DOB: August 02, 1945, 66 y.o.   MRN: [id number]  HPI Here for cough x 2 weeks  Cough : Had uri 2 weeks ago.  Now residual cough.  Non productive.  No significant rhinorrhea.  Improving some with guaifenesin use.  Having trouble sleeping due to cough.  No dyspnea, fever, chills, fatigue.  HTN: noted to be high today.  No chest pain, edema., dyspnea. Not checking BP's at home.  Compliant with HCTZ.  Impaired fasting glucose:  No polyuria, polydipsia.  Review of Systems see above   Objective:   Physical Exam  GEN: Alert & Oriented, No acute distress Oropharyn:  No cobblestoning CV:  Regular Rate & Rhythm, no murmur Respiratory:  Normal work of breathing, CTAB Abd:  + BS, soft, no tenderness to palpation        Assessment & Plan:

## 2011-08-03 NOTE — Patient Instructions (Signed)
Post viral cough can last 4-6 weeks after and upper respiratory infection  Let me know if you have trouble breathing, start to feel sick or other concerns.   Your blood pressure today is high.  If you notice this continues, please follow-up to discuss medication changes.  Higher than 140/90.

## 2011-09-18 ENCOUNTER — Telehealth: Payer: Self-pay | Admitting: Family Medicine

## 2011-09-18 DIAGNOSIS — R7611 Nonspecific reaction to tuberculin skin test without active tuberculosis: Secondary | ICD-10-CM

## 2011-09-18 NOTE — Telephone Encounter (Signed)
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  Also she got a letter from Dr Loreta Ave stating that her iron was low and wasn't sure if Dr Earnest Bailey would be orders meds for this.  If so, her pharmacy is [facility]

## 2011-09-19 DIAGNOSIS — R7611 Nonspecific reaction to tuberculin skin test without active tuberculosis: Secondary | ICD-10-CM | POA: Insufficient documentation

## 2011-09-19 HISTORY — DX: Nonspecific reaction to tuberculin skin test without active tuberculosis: R76.11

## 2011-09-19 NOTE — Telephone Encounter (Signed)
Spoke with patient and informed her of below 

## 2011-09-19 NOTE — Telephone Encounter (Signed)
Please have patient call Dr. Loreta Ave about anemia as I do not have any of that information.  Please let patient know I have ordered a chest xray at her request and inform her how to go to [place] [facility].  Please ask her where she got her PPD done for confirmation and document if she has any fever, cough, dyspnea.  Thanks.

## 2011-09-19 NOTE — Assessment & Plan Note (Signed)
Per patent report.  Ordered cxr at her request

## 2011-09-20 ENCOUNTER — Ambulatory Visit
Admission: RE | Admit: 2011-09-20 | Discharge: 2011-09-20 | Disposition: A | Discharge status: Home / Self Care | Source: Ambulatory Visit | Attending: Family Medicine | Admitting: Family Medicine

## 2011-09-20 DIAGNOSIS — R7611 Nonspecific reaction to tuberculin skin test without active tuberculosis: Secondary | ICD-10-CM

## 2011-09-20 NOTE — Telephone Encounter (Signed)
Discussed with patient- did not have recent positive ppd.  Notes had positive in teens after a family exposure.  Was never treated.  cxr needed normal for workplace.  No fever, cough, chills, dyspnea.  Discussed with patient, will research need for further evaluation/tx and will get back to her.

## 2011-09-21 ENCOUNTER — Encounter: Payer: Self-pay | Admitting: Family Medicine

## 2011-09-21 NOTE — Telephone Encounter (Signed)
No indication for active TB.  No increased risk for conversion of latent TB.  Ok to volunteer, no need for referral for treatment or further evaluation at this time.

## 2011-09-27 ENCOUNTER — Telehealth: Payer: Self-pay | Admitting: Family Medicine

## 2011-09-27 NOTE — Telephone Encounter (Signed)
Patient dropped off form to be filled out for employer.  Please call her when completed.

## 2011-09-27 NOTE — Telephone Encounter (Signed)
Employee Medical Statement for [employer] placed in Dr. Leonie Green box for signature.  Copy of chest x-ray attached.  Ileana Ladd

## 2011-09-28 NOTE — Telephone Encounter (Signed)
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  Ileana Ladd

## 2011-09-28 NOTE — Telephone Encounter (Signed)
Signed and given to Donna.  

## 2011-10-23 ENCOUNTER — Telehealth: Payer: Self-pay | Admitting: Family Medicine

## 2011-10-23 MED ORDER — VALACYCLOVIR HCL 500 MG PO TABS: 500.0000 mg | ORAL_TABLET | Freq: Every day | ORAL | Status: DC

## 2011-10-23 NOTE — Telephone Encounter (Signed)
Pt need rx written for valcyclovir to be m ailed to mail order pharmacy.  Will pick up when ready.  Please call to confirm.

## 2011-10-23 NOTE — Telephone Encounter (Signed)
Placed in front for pickup.  Please call her and let her know it is ready and remind her she is due for fasting blood work- make lab appt. (check blood sugar, cholesterol)

## 2011-11-14 ENCOUNTER — Telehealth: Payer: Self-pay | Admitting: Family Medicine

## 2011-11-14 DIAGNOSIS — G4700 Insomnia, unspecified: Secondary | ICD-10-CM

## 2011-11-14 MED ORDER — ESZOPICLONE 3 MG PO TABS: 3.0000 mg | ORAL_TABLET | Freq: Every day | ORAL | Status: DC

## 2011-11-14 NOTE — Telephone Encounter (Signed)
Please call patient and let her know ready for pickup

## 2011-11-14 NOTE — Telephone Encounter (Signed)
Patient is calling about her Rx for Valcyclovir and Lunesta have expired and she needs to be notified to pick it up.

## 2011-12-22 ENCOUNTER — Other Ambulatory Visit: Payer: Self-pay | Admitting: Orthopedic Surgery

## 2011-12-22 DIAGNOSIS — M549 Dorsalgia, unspecified: Secondary | ICD-10-CM

## 2011-12-29 ENCOUNTER — Ambulatory Visit
Admission: RE | Admit: 2011-12-29 | Discharge: 2011-12-29 | Disposition: A | Discharge status: Home / Self Care | Source: Ambulatory Visit | Attending: Orthopedic Surgery | Admitting: Orthopedic Surgery

## 2011-12-29 DIAGNOSIS — M549 Dorsalgia, unspecified: Secondary | ICD-10-CM

## 2011-12-29 MED ORDER — METHYLPREDNISOLONE ACETATE 40 MG/ML INJ SUSP (RADIOLOG
120.0000 mg | Freq: Once | INTRAMUSCULAR | Status: AC
  Administered 2011-12-29: 120 mg via EPIDURAL

## 2011-12-29 MED ORDER — IOHEXOL 180 MG/ML  SOLN
1.0000 mL | Freq: Once | INTRAMUSCULAR | Status: AC | PRN
  Administered 2011-12-29: 1 mL via EPIDURAL

## 2012-02-09 ENCOUNTER — Other Ambulatory Visit: Payer: Self-pay | Admitting: Family Medicine

## 2012-02-09 DIAGNOSIS — Z1231 Encounter for screening mammogram for malignant neoplasm of breast: Secondary | ICD-10-CM

## 2012-02-12 ENCOUNTER — Other Ambulatory Visit: Payer: Self-pay | Admitting: Orthopedic Surgery

## 2012-02-12 DIAGNOSIS — M47817 Spondylosis without myelopathy or radiculopathy, lumbosacral region: Secondary | ICD-10-CM

## 2012-02-15 ENCOUNTER — Ambulatory Visit
Admission: RE | Admit: 2012-02-15 | Discharge: 2012-02-15 | Disposition: A | Discharge status: Home / Self Care | Source: Ambulatory Visit | Attending: Orthopedic Surgery | Admitting: Orthopedic Surgery

## 2012-02-15 VITALS — BP 154/75 | HR 56

## 2012-02-15 DIAGNOSIS — M47817 Spondylosis without myelopathy or radiculopathy, lumbosacral region: Secondary | ICD-10-CM

## 2012-02-15 MED ORDER — IOHEXOL 180 MG/ML  SOLN
1.0000 mL | Freq: Once | INTRAMUSCULAR | Status: AC | PRN
  Administered 2012-02-15: 1 mL via EPIDURAL

## 2012-02-15 MED ORDER — METHYLPREDNISOLONE ACETATE 40 MG/ML INJ SUSP (RADIOLOG
120.0000 mg | Freq: Once | INTRAMUSCULAR | Status: AC
  Administered 2012-02-15: 120 mg via EPIDURAL

## 2012-02-29 ENCOUNTER — Ambulatory Visit (HOSPITAL_COMMUNITY)
Admission: RE | Admit: 2012-02-29 | Discharge: 2012-02-29 | Disposition: A | Discharge status: Home / Self Care | Source: Ambulatory Visit | Attending: Family Medicine | Admitting: Family Medicine

## 2012-02-29 DIAGNOSIS — Z1231 Encounter for screening mammogram for malignant neoplasm of breast: Secondary | ICD-10-CM

## 2012-03-19 ENCOUNTER — Other Ambulatory Visit: Payer: Self-pay | Admitting: Family Medicine

## 2012-03-19 MED ORDER — GABAPENTIN 300 MG PO CAPS: 600.0000 mg | ORAL_CAPSULE | Freq: Every day | ORAL | Status: DC

## 2012-03-19 MED ORDER — HYDROCHLOROTHIAZIDE 25 MG PO TABS: 25.0000 mg | ORAL_TABLET | Freq: Every day | ORAL | Status: DC

## 2012-03-19 MED ORDER — VALACYCLOVIR HCL 500 MG PO TABS: 500.0000 mg | ORAL_TABLET | Freq: Every day | ORAL | Status: DC

## 2012-03-19 NOTE — Telephone Encounter (Signed)
Patient is calling for the printed Rx's for Valcyclovir, Hydrochlorothiazide and Gabapentin.  She needs the printed Rx's to mail off to her pharmacy and would appreciate a call when they are ready for pick up.

## 2012-03-19 NOTE — Telephone Encounter (Signed)
Will forward to Dr. Briscoe. 

## 2012-06-11 ENCOUNTER — Encounter: Payer: Self-pay | Admitting: Family Medicine

## 2012-06-11 ENCOUNTER — Ambulatory Visit (INDEPENDENT_AMBULATORY_CARE_PROVIDER_SITE_OTHER): Admitting: Family Medicine

## 2012-06-11 VITALS — BP 154/72 | HR 79 | Temp 98.2°F | Ht 63.0 in | Wt 209.7 lb

## 2012-06-11 DIAGNOSIS — I8390 Asymptomatic varicose veins of unspecified lower extremity: Secondary | ICD-10-CM | POA: Diagnosis not present

## 2012-06-11 DIAGNOSIS — I10 Essential (primary) hypertension: Secondary | ICD-10-CM | POA: Diagnosis not present

## 2012-06-11 DIAGNOSIS — G5600 Carpal tunnel syndrome, unspecified upper limb: Secondary | ICD-10-CM

## 2012-06-11 NOTE — Assessment & Plan Note (Signed)
Elevated today.  Reports good readings outside office.  Just took daily medicine.  Advised to let me know if continues to run high.  Will follow-up at routine visit in 2 months

## 2012-06-11 NOTE — Patient Instructions (Addendum)
Let me know if you would like referal for your carpal tunnel or veins  Let's get fasting labwork and an office visit in the new year  Your blood pressure was high today.  Goal is less than 140/90.  If stays high when you check, let me know

## 2012-06-11 NOTE — Progress Notes (Signed)
  Subjective:    Patient ID: Angie [name], female    DOB: 24-Dec-1945, 66 y.o.   MRN: [id number]  HPI  Work in for hand pain  Bilateral carpal:  Previous diagnosis.  Worse for past few months.  Worst when  driving or at night, notices bilateral hand numbness.  Also worst with quilting.  Has been wearing wrist braces at night which has helped some.    bilateral leg pain:  Wearing compression stockings.  Does help.  Achey crampy pain in bilateral calves.  Worse when standing and walking.  Better with elevation.  NO severe pain, does not stop her.  Tender over areas of varicosity.  HTN:  elavetd today.  Notes it is good when checks at home. Just took HCTZ a few minutes ago. Review of Systems See HPI    Objective:   Physical Exam GEN: Alert & Oriented, No acute distress CV:  Regular Rate & Rhythm, no murmur Respiratory:  Normal work of breathing, CTAB Abd:  + BS, soft, no tenderness to palpation Ext: no pre-tibial edema, no calf tenderness.  Mildly tender or small varicosities, no evidence of hematoma or clot.   MSK;  Positive tinel bilaterally        Assessment & Plan:

## 2012-06-11 NOTE — Assessment & Plan Note (Signed)
Classic carpal tunnel syndrome bilaterally.  Advised may benefit from injection vs surgery as it has been resistant to conservative measures and interferes with hobbies.  She would like to try wearing wrist braces more consistently, using ice and ibuprofen.  Gave her handout.  She will let me know if she would like referral to hand ortho in the future.

## 2012-06-11 NOTE — Assessment & Plan Note (Signed)
Symptoms of leg fatigue had improved with compression stockings, now worsened since stockings have stretched out. No sign sof dvt and history of physical not consistent with PAD.   Patient would like to replace her compression stockings,  I offered to write rx if needed.  Also discussed referral to vein center as a reasonable approach given her symptoms- she would like to defer at this time- will let me know.

## 2012-08-16 ENCOUNTER — Encounter: Payer: Self-pay | Admitting: Family Medicine

## 2012-08-16 ENCOUNTER — Ambulatory Visit (INDEPENDENT_AMBULATORY_CARE_PROVIDER_SITE_OTHER): Payer: Medicare Other | Admitting: Family Medicine

## 2012-08-16 VITALS — BP 144/85 | HR 81 | Ht 63.0 in | Wt 210.0 lb

## 2012-08-16 DIAGNOSIS — G5600 Carpal tunnel syndrome, unspecified upper limb: Secondary | ICD-10-CM

## 2012-08-16 DIAGNOSIS — I10 Essential (primary) hypertension: Secondary | ICD-10-CM

## 2012-08-16 DIAGNOSIS — R7301 Impaired fasting glucose: Secondary | ICD-10-CM | POA: Diagnosis not present

## 2012-08-16 DIAGNOSIS — N951 Menopausal and female climacteric states: Secondary | ICD-10-CM | POA: Diagnosis not present

## 2012-08-16 DIAGNOSIS — G4700 Insomnia, unspecified: Secondary | ICD-10-CM

## 2012-08-16 LAB — COMPREHENSIVE METABOLIC PANEL
ALT: 18 U/L (ref 0–35)
AST: 22 U/L (ref 0–37)
Albumin: 4.3 g/dL (ref 3.5–5.2)
Alkaline Phosphatase: 81 U/L (ref 39–117)
BUN: 18 mg/dL (ref 6–23)
CO2: 29 mEq/L (ref 19–32)
Calcium: 9.6 mg/dL (ref 8.4–10.5)
Chloride: 105 mEq/L (ref 96–112)
Creat: 0.69 mg/dL (ref 0.50–1.10)
Glucose, Bld: 98 mg/dL (ref 70–99)
Potassium: 3.9 mEq/L (ref 3.5–5.3)
Sodium: 142 mEq/L (ref 135–145)
Total Bilirubin: 0.5 mg/dL (ref 0.3–1.2)
Total Protein: 6.5 g/dL (ref 6.0–8.3)

## 2012-08-16 LAB — CBC WITH DIFFERENTIAL/PLATELET
Basophils Absolute: 0 10*3/uL (ref 0.0–0.1)
Basophils Relative: 1 % (ref 0–1)
Eosinophils Absolute: 0.2 10*3/uL (ref 0.0–0.7)
Eosinophils Relative: 3 % (ref 0–5)
HCT: 38.7 % (ref 36.0–46.0)
Hemoglobin: 13.1 g/dL (ref 12.0–15.0)
Lymphocytes Relative: 42 % (ref 12–46)
Lymphs Abs: 2.6 10*3/uL (ref 0.7–4.0)
MCH: 29.8 pg (ref 26.0–34.0)
MCHC: 33.9 g/dL (ref 30.0–36.0)
MCV: 88 fL (ref 78.0–100.0)
Monocytes Absolute: 0.4 10*3/uL (ref 0.1–1.0)
Monocytes Relative: 6 % (ref 3–12)
Neutro Abs: 3.1 10*3/uL (ref 1.7–7.7)
Neutrophils Relative %: 48 % (ref 43–77)
Platelets: 226 10*3/uL (ref 150–400)
RBC: 4.4 MIL/uL (ref 3.87–5.11)
RDW: 14.1 % (ref 11.5–15.5)
WBC: 6.2 10*3/uL (ref 4.0–10.5)

## 2012-08-16 LAB — LIPID PANEL
Cholesterol: 209 mg/dL — ABNORMAL HIGH (ref 0–200)
HDL: 52 mg/dL (ref >39)
LDL Cholesterol: 137 mg/dL — ABNORMAL HIGH (ref 0–99)
Total CHOL/HDL Ratio: 4 Ratio
Triglycerides: 99 mg/dL (ref <150)
VLDL: 20 mg/dL (ref 0–40)

## 2012-08-16 LAB — TSH: TSH: 2.23 u[IU]/mL (ref 0.350–4.500)

## 2012-08-16 MED ORDER — GABAPENTIN 300 MG PO CAPS: 600.0000 mg | ORAL_CAPSULE | Freq: Every day | ORAL | Status: DC

## 2012-08-16 MED ORDER — HYDROCHLOROTHIAZIDE 25 MG PO TABS: 25.0000 mg | ORAL_TABLET | Freq: Every day | ORAL | Status: DC

## 2012-08-16 MED ORDER — SUMATRIPTAN SUCCINATE 25 MG PO TABS: 25.0000 mg | ORAL_TABLET | ORAL | Status: DC | PRN

## 2012-08-16 MED ORDER — ESZOPICLONE 3 MG PO TABS: 3.0000 mg | ORAL_TABLET | Freq: Every day | ORAL | Status: DC

## 2012-08-16 MED ORDER — VALACYCLOVIR HCL 500 MG PO TABS: 500.0000 mg | ORAL_TABLET | Freq: Every day | ORAL | Status: DC

## 2012-08-16 NOTE — Assessment & Plan Note (Signed)
Will check fasting labs today.  

## 2012-08-16 NOTE — Assessment & Plan Note (Signed)
Normal on recheck.  Will refill HCTZ for 1 year.  Fasting labs today

## 2012-08-16 NOTE — Assessment & Plan Note (Signed)
Only using sedative several times per month.  Will refill today

## 2012-08-16 NOTE — Progress Notes (Signed)
  Subjective:    Patient ID: Angie [name], female    DOB: 06/03/1946, 67 y.o.   MRN: [id number]  HPI Here for follow-up of hypertension and refill meds  HYPERTENSION  BP Readings from Last 3 Encounters:  08/16/12 144/85  06/11/12 154/72  02/15/12 154/75    Hypertension ROS: taking medications as instructed, no medication side effects noted, home BP monitoring in range of 130's systolic over 80's diastolic, no TIA's, no chest pain on exertion, no dyspnea on exertion, no swelling of ankles and no intermittent claudication symptoms.   Leg pain: resolved  No further troubles with varicosities at this time  Carpal tunnel:  Worsening numbness, tingling despite wearing braces at night    Review of Systems See HPI    Objective:   Physical Exam GEN: Alert & Oriented, No acute distress CV:  Regular Rate & Rhythm, no murmur Respiratory:  Normal work of breathing, CTAB Abd:  + BS, soft, no tenderness to palpation Ext: no pre-tibial edema.  Bilateral hands normal grip strength       Assessment & Plan:

## 2012-08-16 NOTE — Assessment & Plan Note (Signed)
Will refer to ortho to discus soptions

## 2012-08-16 NOTE — Patient Instructions (Addendum)
Will call Dr Eilleen Kempf office to see if she does carpal tunnel surgery.  If not- will refer you to a hand surgeon  Will send you letter with normal lab results in 1-2 weeks  I recommend flu and pneumonia vaccines  Follow-up every 6-12 months

## 2012-08-16 NOTE — Assessment & Plan Note (Signed)
Doing well on gabapentin, will refill today

## 2012-08-19 ENCOUNTER — Encounter: Payer: Self-pay | Admitting: Family Medicine

## 2012-08-20 DIAGNOSIS — M25549 Pain in joints of unspecified hand: Secondary | ICD-10-CM | POA: Diagnosis not present

## 2012-08-27 DIAGNOSIS — G5600 Carpal tunnel syndrome, unspecified upper limb: Secondary | ICD-10-CM | POA: Diagnosis not present

## 2012-08-29 DIAGNOSIS — G5600 Carpal tunnel syndrome, unspecified upper limb: Secondary | ICD-10-CM | POA: Diagnosis not present

## 2012-09-03 DIAGNOSIS — H2510 Age-related nuclear cataract, unspecified eye: Secondary | ICD-10-CM | POA: Diagnosis not present

## 2012-09-03 DIAGNOSIS — H35369 Drusen (degenerative) of macula, unspecified eye: Secondary | ICD-10-CM | POA: Diagnosis not present

## 2012-09-07 ENCOUNTER — Other Ambulatory Visit: Payer: Self-pay

## 2012-09-17 DIAGNOSIS — R197 Diarrhea, unspecified: Secondary | ICD-10-CM | POA: Diagnosis not present

## 2012-09-17 DIAGNOSIS — R1013 Epigastric pain: Secondary | ICD-10-CM | POA: Diagnosis not present

## 2012-09-17 DIAGNOSIS — K219 Gastro-esophageal reflux disease without esophagitis: Secondary | ICD-10-CM | POA: Diagnosis not present

## 2012-09-17 DIAGNOSIS — R110 Nausea: Secondary | ICD-10-CM | POA: Diagnosis not present

## 2012-09-18 DIAGNOSIS — M65839 Other synovitis and tenosynovitis, unspecified forearm: Secondary | ICD-10-CM | POA: Diagnosis not present

## 2012-09-18 DIAGNOSIS — M659 Synovitis and tenosynovitis, unspecified: Secondary | ICD-10-CM | POA: Diagnosis not present

## 2012-09-18 DIAGNOSIS — M65849 Other synovitis and tenosynovitis, unspecified hand: Secondary | ICD-10-CM | POA: Diagnosis not present

## 2012-09-18 DIAGNOSIS — G5600 Carpal tunnel syndrome, unspecified upper limb: Secondary | ICD-10-CM | POA: Diagnosis not present

## 2012-09-26 DIAGNOSIS — M25649 Stiffness of unspecified hand, not elsewhere classified: Secondary | ICD-10-CM | POA: Diagnosis not present

## 2012-09-26 DIAGNOSIS — G5600 Carpal tunnel syndrome, unspecified upper limb: Secondary | ICD-10-CM | POA: Diagnosis not present

## 2012-09-26 DIAGNOSIS — M79609 Pain in unspecified limb: Secondary | ICD-10-CM | POA: Diagnosis not present

## 2012-09-26 DIAGNOSIS — S61409A Unspecified open wound of unspecified hand, initial encounter: Secondary | ICD-10-CM | POA: Diagnosis not present

## 2012-10-03 DIAGNOSIS — M25649 Stiffness of unspecified hand, not elsewhere classified: Secondary | ICD-10-CM | POA: Diagnosis not present

## 2012-10-03 DIAGNOSIS — M79609 Pain in unspecified limb: Secondary | ICD-10-CM | POA: Diagnosis not present

## 2012-10-03 DIAGNOSIS — S61409A Unspecified open wound of unspecified hand, initial encounter: Secondary | ICD-10-CM | POA: Diagnosis not present

## 2012-10-03 DIAGNOSIS — G5600 Carpal tunnel syndrome, unspecified upper limb: Secondary | ICD-10-CM | POA: Diagnosis not present

## 2012-10-08 DIAGNOSIS — G5600 Carpal tunnel syndrome, unspecified upper limb: Secondary | ICD-10-CM | POA: Diagnosis not present

## 2012-10-08 DIAGNOSIS — M79609 Pain in unspecified limb: Secondary | ICD-10-CM | POA: Diagnosis not present

## 2012-10-08 DIAGNOSIS — S61409A Unspecified open wound of unspecified hand, initial encounter: Secondary | ICD-10-CM | POA: Diagnosis not present

## 2012-10-08 DIAGNOSIS — M25649 Stiffness of unspecified hand, not elsewhere classified: Secondary | ICD-10-CM | POA: Diagnosis not present

## 2012-10-17 DIAGNOSIS — S61409A Unspecified open wound of unspecified hand, initial encounter: Secondary | ICD-10-CM | POA: Diagnosis not present

## 2012-10-17 DIAGNOSIS — M25649 Stiffness of unspecified hand, not elsewhere classified: Secondary | ICD-10-CM | POA: Diagnosis not present

## 2012-10-17 DIAGNOSIS — G5600 Carpal tunnel syndrome, unspecified upper limb: Secondary | ICD-10-CM | POA: Diagnosis not present

## 2012-10-17 DIAGNOSIS — M79609 Pain in unspecified limb: Secondary | ICD-10-CM | POA: Diagnosis not present

## 2012-10-21 DIAGNOSIS — M79609 Pain in unspecified limb: Secondary | ICD-10-CM | POA: Diagnosis not present

## 2012-10-21 DIAGNOSIS — S61409A Unspecified open wound of unspecified hand, initial encounter: Secondary | ICD-10-CM | POA: Diagnosis not present

## 2012-10-21 DIAGNOSIS — G5600 Carpal tunnel syndrome, unspecified upper limb: Secondary | ICD-10-CM | POA: Diagnosis not present

## 2012-10-21 DIAGNOSIS — M25649 Stiffness of unspecified hand, not elsewhere classified: Secondary | ICD-10-CM | POA: Diagnosis not present

## 2012-12-25 DIAGNOSIS — M65839 Other synovitis and tenosynovitis, unspecified forearm: Secondary | ICD-10-CM | POA: Diagnosis not present

## 2012-12-25 DIAGNOSIS — G5600 Carpal tunnel syndrome, unspecified upper limb: Secondary | ICD-10-CM | POA: Diagnosis not present

## 2012-12-25 DIAGNOSIS — M65849 Other synovitis and tenosynovitis, unspecified hand: Secondary | ICD-10-CM | POA: Diagnosis not present

## 2013-01-07 DIAGNOSIS — M79609 Pain in unspecified limb: Secondary | ICD-10-CM | POA: Diagnosis not present

## 2013-01-07 DIAGNOSIS — S61409A Unspecified open wound of unspecified hand, initial encounter: Secondary | ICD-10-CM | POA: Diagnosis not present

## 2013-01-07 DIAGNOSIS — G5600 Carpal tunnel syndrome, unspecified upper limb: Secondary | ICD-10-CM | POA: Diagnosis not present

## 2013-01-14 DIAGNOSIS — G5600 Carpal tunnel syndrome, unspecified upper limb: Secondary | ICD-10-CM | POA: Diagnosis not present

## 2013-01-14 DIAGNOSIS — S61409A Unspecified open wound of unspecified hand, initial encounter: Secondary | ICD-10-CM | POA: Diagnosis not present

## 2013-01-14 DIAGNOSIS — M79609 Pain in unspecified limb: Secondary | ICD-10-CM | POA: Diagnosis not present

## 2013-01-17 DIAGNOSIS — G5600 Carpal tunnel syndrome, unspecified upper limb: Secondary | ICD-10-CM | POA: Diagnosis not present

## 2013-01-17 DIAGNOSIS — M79609 Pain in unspecified limb: Secondary | ICD-10-CM | POA: Diagnosis not present

## 2013-01-17 DIAGNOSIS — S61409A Unspecified open wound of unspecified hand, initial encounter: Secondary | ICD-10-CM | POA: Diagnosis not present

## 2013-01-21 DIAGNOSIS — G5600 Carpal tunnel syndrome, unspecified upper limb: Secondary | ICD-10-CM | POA: Diagnosis not present

## 2013-01-31 DIAGNOSIS — G5600 Carpal tunnel syndrome, unspecified upper limb: Secondary | ICD-10-CM | POA: Diagnosis not present

## 2013-02-03 DIAGNOSIS — G5600 Carpal tunnel syndrome, unspecified upper limb: Secondary | ICD-10-CM | POA: Diagnosis not present

## 2013-02-06 DIAGNOSIS — G5600 Carpal tunnel syndrome, unspecified upper limb: Secondary | ICD-10-CM | POA: Diagnosis not present

## 2013-02-10 DIAGNOSIS — G5600 Carpal tunnel syndrome, unspecified upper limb: Secondary | ICD-10-CM | POA: Diagnosis not present

## 2013-02-12 DIAGNOSIS — M6530 Trigger finger, unspecified finger: Secondary | ICD-10-CM | POA: Diagnosis not present

## 2013-02-13 DIAGNOSIS — G5600 Carpal tunnel syndrome, unspecified upper limb: Secondary | ICD-10-CM | POA: Diagnosis not present

## 2013-02-18 DIAGNOSIS — G5600 Carpal tunnel syndrome, unspecified upper limb: Secondary | ICD-10-CM | POA: Diagnosis not present

## 2013-02-26 ENCOUNTER — Other Ambulatory Visit: Payer: Self-pay

## 2013-03-25 DIAGNOSIS — M6530 Trigger finger, unspecified finger: Secondary | ICD-10-CM | POA: Diagnosis not present

## 2013-04-24 DIAGNOSIS — K5730 Diverticulosis of large intestine without perforation or abscess without bleeding: Secondary | ICD-10-CM | POA: Diagnosis not present

## 2013-04-24 DIAGNOSIS — K5900 Constipation, unspecified: Secondary | ICD-10-CM | POA: Diagnosis not present

## 2013-04-24 DIAGNOSIS — K219 Gastro-esophageal reflux disease without esophagitis: Secondary | ICD-10-CM | POA: Diagnosis not present

## 2013-05-07 ENCOUNTER — Other Ambulatory Visit: Payer: Self-pay | Admitting: Family Medicine

## 2013-05-07 DIAGNOSIS — Z1231 Encounter for screening mammogram for malignant neoplasm of breast: Secondary | ICD-10-CM

## 2013-05-27 ENCOUNTER — Ambulatory Visit (HOSPITAL_COMMUNITY)
Admission: RE | Admit: 2013-05-27 | Discharge: 2013-05-27 | Disposition: A | Discharge status: Home / Self Care | Payer: Medicare Other | Source: Ambulatory Visit | Attending: Family Medicine | Admitting: Family Medicine

## 2013-05-27 DIAGNOSIS — Z1231 Encounter for screening mammogram for malignant neoplasm of breast: Secondary | ICD-10-CM

## 2013-05-29 ENCOUNTER — Other Ambulatory Visit: Payer: Self-pay

## 2013-08-12 ENCOUNTER — Ambulatory Visit (INDEPENDENT_AMBULATORY_CARE_PROVIDER_SITE_OTHER): Payer: Medicare Other | Admitting: Family Medicine

## 2013-08-12 ENCOUNTER — Encounter: Payer: Self-pay | Admitting: Family Medicine

## 2013-08-12 VITALS — BP 135/76 | HR 82 | Temp 98.5°F | Wt 217.0 lb

## 2013-08-12 DIAGNOSIS — I10 Essential (primary) hypertension: Secondary | ICD-10-CM | POA: Diagnosis not present

## 2013-08-12 DIAGNOSIS — K141 Geographic tongue: Secondary | ICD-10-CM

## 2013-08-12 DIAGNOSIS — N951 Menopausal and female climacteric states: Secondary | ICD-10-CM

## 2013-08-12 DIAGNOSIS — R0602 Shortness of breath: Secondary | ICD-10-CM

## 2013-08-12 DIAGNOSIS — Z0000 Encounter for general adult medical examination without abnormal findings: Secondary | ICD-10-CM

## 2013-08-12 DIAGNOSIS — R232 Flushing: Secondary | ICD-10-CM

## 2013-08-12 HISTORY — DX: Geographic tongue: K14.1

## 2013-08-12 MED ORDER — VALACYCLOVIR HCL 500 MG PO TABS: 500.0000 mg | ORAL_TABLET | Freq: Every day | ORAL | Status: DC

## 2013-08-12 MED ORDER — ESOMEPRAZOLE MAGNESIUM 40 MG PO CPDR: 40.0000 mg | DELAYED_RELEASE_CAPSULE | Freq: Every day | ORAL | Status: DC

## 2013-08-12 MED ORDER — ALBUTEROL SULFATE HFA 108 (90 BASE) MCG/ACT IN AERS: 2.0000 | INHALATION_SPRAY | RESPIRATORY_TRACT | Status: DC | PRN

## 2013-08-12 MED ORDER — GABAPENTIN 300 MG PO CAPS: 600.0000 mg | ORAL_CAPSULE | Freq: Every day | ORAL | Status: DC

## 2013-08-12 MED ORDER — SUMATRIPTAN SUCCINATE 25 MG PO TABS: 25.0000 mg | ORAL_TABLET | ORAL | Status: DC | PRN

## 2013-08-12 MED ORDER — HYDROCHLOROTHIAZIDE 25 MG PO TABS: 25.0000 mg | ORAL_TABLET | Freq: Every day | ORAL | Status: DC

## 2013-08-12 NOTE — Assessment & Plan Note (Signed)
Currently asymptomatic. R/O CHF vs COPD. ECHO ordered to assess LVEF. Chest xray ordered. Albuterol prn SOB prescribed. If persistent with normal ECHO and chest xray will refer for PFT.

## 2013-08-12 NOTE — Assessment & Plan Note (Signed)
Doing well on Gabapentin. I refilled her medication.

## 2013-08-12 NOTE — Patient Instructions (Signed)
Shortness of Breath  Shortness of breath means you have trouble breathing. Shortness of breath needs medical care right away.  HOME CARE   · Do not smoke.  · Avoid being around chemicals or things (paint fumes, dust) that may bother your breathing.  · Rest as needed. Slowly begin your normal activities.  · Only take medicines as told by your doctor.  · Keep all doctor visits as told.  GET HELP RIGHT AWAY IF:   · Your shortness of breath gets worse.  · You feel lightheaded, pass out (faint), or have a cough that is not helped by medicine.  · You cough up blood.  · You have pain with breathing.  · You have pain in your chest, arms, shoulders, or belly (abdomen).  · You have a fever.  · You cannot walk up stairs or exercise the way you normally do.  · You do not get better in the time expected.  · You have a hard time doing normal activities even with rest.  · You have problems with your medicines.  · You have any new symptoms.  MAKE SURE YOU:  · Understand these instructions.  · Will watch your condition.  · Will get help right away if you are not doing well or get worse.  Document Released: 12/27/2007 Document Revised: 01/09/2012 Document Reviewed: 09/25/2011  [employer]® Patient Information ©2014 [employer].

## 2013-08-12 NOTE — Assessment & Plan Note (Addendum)
Endorsed having mammogram done last year even though not record on Epic. She will provide result at next visit. Per record she had colonoscopy done in 2005. Patient advised to schedule f/u in 2 wks for her PAP, she verbalized understanding.

## 2013-08-12 NOTE — Assessment & Plan Note (Signed)
BP looks ok,I refilled her medication. RTC in 4 wks for blood work.

## 2013-08-12 NOTE — Assessment & Plan Note (Signed)
Patient reassured this is benign,tongue exam looks ok. Trigger of burning sensation such as acidic food advised to be avoided. MVI may help. Patient agreed and will consider ENT referral in the future if any changes to her symptoms.

## 2013-08-12 NOTE — Progress Notes (Signed)
Subjective:     Patient ID: Angie [name], female   DOB: April 09, 1946, 68 y.o.   MRN: [id number]  HPI Geographic tongue: Patient has had pebble sensation and roughness on her tongue for more than 10 yrs now, she has been evaluated by the gastroenterologist in the past who treated her with Nystatin mouth wash, gave lidocaine and codeine with no improvement, she has also used topical steroid on her tongue.Denies pain,but feels uncomfortable on her tongue, symptom is triggered by sour/acidic food at times.She called her Gi recently who had told her he does not have anything else to offer but to consider ENT assessment. SOB:C/O SOB on exertion on going for 4 months, this occurs even when she bends over to tie her shoe lace or if she walks from her bedroom to her kitchen.This is not worsening, feels better with rest.Associated cough on and off,no chest tightness.Denies pedal swelling.Quit smoking 22 yrs ago.Denies PND or orthopnea. SEG:BTDVVOHYW with HCTZ 25 mg qd,she is needing refill. Hot flashes: Doing well on gabapentin,she need refill. HM:Has not had her PAP, she had colonoscopy done,she also stated she had mammogram done mid last year 2014 and got a letter in the mail stating it was normal.  Current Outpatient Prescriptions on File Prior to Visit  Medication Sig Dispense Refill  . gabapentin (NEURONTIN) 300 MG capsule Take 2 capsules (600 mg total) by mouth at bedtime. take two tablets before bed for hot flashes  180 capsule  3  . hydrochlorothiazide (HYDRODIURIL) 25 MG tablet Take 1 tablet (25 mg total) by mouth daily.  90 tablet  3  . valACYclovir (VALTREX) 500 MG tablet Take 1 tablet (500 mg total) by mouth daily.  90 tablet  3  . Elastic Bandages & Supports (FUTURO FIRM COMPRESSION HOSE) MISC Ted Hose, medium compression       . Eszopiclone (ESZOPICLONE) 3 MG TABS Take 1 tablet (3 mg total) by mouth at bedtime. As needed for sleep  30 tablet  1  . SUMAtriptan (IMITREX) 25 MG tablet Take 1  tablet (25 mg total) by mouth every 2 (two) hours as needed for migraine. take one tablet at start of headache.  May repeat in 2 hours x 1 if needed.  10 tablet  0   No current facility-administered medications on file prior to visit.   Past Medical History  Diagnosis Date  . Hypertension   . Diverticulosis   . Herpes zoster   . Migraine   . Chronic back pain   . Hyperlipidemia       Review of Systems  HENT:       Roughness on her tongue.  Respiratory: Positive for shortness of breath. Negative for wheezing.   Cardiovascular: Negative for palpitations and leg swelling.  Gastrointestinal: Negative.   Genitourinary: Negative.   Neurological: Negative.   All other systems reviewed and are negative.   Filed Vitals:   08/12/13 1050  BP: 135/76  Pulse: 82  Temp: 98.5 F (36.9 C)  TempSrc: Oral  Weight: 217 lb (98.431 kg)  SpO2: 98%       Objective:   Physical Exam  Nursing note and vitals reviewed. Constitutional: She appears well-developed. No distress.  HENT:  Mouth/Throat:    Cardiovascular: Normal rate, regular rhythm, normal heart sounds and intact distal pulses.   No murmur heard. Pulmonary/Chest: Effort normal and breath sounds normal. No respiratory distress. She has no wheezes.  Abdominal: Soft. Bowel sounds are normal. She exhibits no distension and no mass. There is  no tenderness. There is no guarding.  Musculoskeletal: She exhibits no edema.       Assessment:     Geographical tongue SOB HTN Hot flashes Health maintenance.     Plan:     Check problem list.

## 2013-08-14 ENCOUNTER — Ambulatory Visit
Admission: RE | Admit: 2013-08-14 | Discharge: 2013-08-14 | Disposition: A | Discharge status: Home / Self Care | Payer: Medicare Other | Source: Ambulatory Visit | Attending: Family Medicine | Admitting: Family Medicine

## 2013-08-14 DIAGNOSIS — R0602 Shortness of breath: Secondary | ICD-10-CM

## 2013-08-20 ENCOUNTER — Ambulatory Visit (HOSPITAL_COMMUNITY)
Admission: RE | Admit: 2013-08-20 | Discharge: 2013-08-20 | Disposition: A | Discharge status: Home / Self Care | Payer: Medicare Other | Source: Ambulatory Visit | Attending: Family Medicine | Admitting: Family Medicine

## 2013-08-20 DIAGNOSIS — I517 Cardiomegaly: Secondary | ICD-10-CM | POA: Diagnosis not present

## 2013-08-20 DIAGNOSIS — R0609 Other forms of dyspnea: Secondary | ICD-10-CM | POA: Insufficient documentation

## 2013-08-20 DIAGNOSIS — Z8249 Family history of ischemic heart disease and other diseases of the circulatory system: Secondary | ICD-10-CM | POA: Insufficient documentation

## 2013-08-20 DIAGNOSIS — I10 Essential (primary) hypertension: Secondary | ICD-10-CM | POA: Insufficient documentation

## 2013-08-20 DIAGNOSIS — R0602 Shortness of breath: Secondary | ICD-10-CM

## 2013-08-20 DIAGNOSIS — R0989 Other specified symptoms and signs involving the circulatory and respiratory systems: Secondary | ICD-10-CM | POA: Insufficient documentation

## 2013-08-20 NOTE — Progress Notes (Signed)
  Echocardiogram 2D Echocardiogram has been performed.  Angie [name] 08/20/2013, 2:54 PM

## 2013-09-02 ENCOUNTER — Encounter: Payer: Self-pay | Admitting: Family Medicine

## 2013-09-02 ENCOUNTER — Ambulatory Visit (INDEPENDENT_AMBULATORY_CARE_PROVIDER_SITE_OTHER): Payer: Medicare Other | Admitting: Family Medicine

## 2013-09-02 ENCOUNTER — Other Ambulatory Visit (HOSPITAL_COMMUNITY)
Admission: RE | Admit: 2013-09-02 | Discharge: 2013-09-02 | Disposition: A | Discharge status: Home / Self Care | Payer: Medicare Other | Source: Ambulatory Visit | Attending: Family Medicine | Admitting: Family Medicine

## 2013-09-02 VITALS — BP 151/76 | HR 80 | Temp 98.8°F | Resp 18

## 2013-09-02 DIAGNOSIS — E785 Hyperlipidemia, unspecified: Secondary | ICD-10-CM

## 2013-09-02 DIAGNOSIS — Z124 Encounter for screening for malignant neoplasm of cervix: Secondary | ICD-10-CM | POA: Diagnosis not present

## 2013-09-02 DIAGNOSIS — Z0000 Encounter for general adult medical examination without abnormal findings: Secondary | ICD-10-CM

## 2013-09-02 DIAGNOSIS — IMO0001 Reserved for inherently not codable concepts without codable children: Secondary | ICD-10-CM

## 2013-09-02 LAB — BASIC METABOLIC PANEL
BUN: 14 mg/dL (ref 6–23)
CO2: 31 mEq/L (ref 19–32)
Calcium: 10 mg/dL (ref 8.4–10.5)
Chloride: 104 mEq/L (ref 96–112)
Creat: 0.66 mg/dL (ref 0.50–1.10)
Glucose, Bld: 107 mg/dL — ABNORMAL HIGH (ref 70–99)
Potassium: 3.8 mEq/L (ref 3.5–5.3)
Sodium: 140 mEq/L (ref 135–145)

## 2013-09-02 LAB — LIPID PANEL
Cholesterol: 206 mg/dL — ABNORMAL HIGH (ref 0–200)
HDL: 62 mg/dL (ref >39)
LDL Cholesterol: 125 mg/dL — ABNORMAL HIGH (ref 0–99)
Total CHOL/HDL Ratio: 3.3 Ratio
Triglycerides: 97 mg/dL (ref <150)
VLDL: 19 mg/dL (ref 0–40)

## 2013-09-02 NOTE — Assessment & Plan Note (Signed)
Normal exam. PAP done today. She is due for colonoscopy in Nov this year, she is aware and plan to schedule her own appointment with the GI. She also stated she will schedule her mammogram soon.\FLP,BMET checked today as part of her routine annual exam.

## 2013-09-02 NOTE — Patient Instructions (Signed)
Please remember to schedule your mammogram done as well as your colonoscopy for this year.   Pap Test A Pap test is a procedure done in a clinic office to evaluate cells that are on the surface of the cervix. The cervix is the lower portion of the uterus and upper portion of the vagina. For some women, the cervical region has the potential to form cancer. With consistent evaluations by your caregiver, this type of cancer can be prevented.  If a Pap test is abnormal, it is most often a result of a previous exposure to human papillomavirus (HPV). HPV is a virus that can infect the cells of the cervix and cause dysplasia. Dysplasia is where the cells no longer look normal. If a woman has been diagnosed with high-grade or severe dysplasia, they are at higher risk of developing cervical cancer. People diagnosed with low-grade dysplasia should still be seen by their caregiver because there is a small chance that low-grade dysplasia could develop into cancer.  LET YOUR CAREGIVER KNOW ABOUT:  Recent sexually transmitted infection (STI) you have had.  Any new sex partners you have had.  History of previous abnormal Pap tests results.  History of previous cervical procedures you have had (colposcopy, biopsy, loop electrosurgical excision procedure [LEEP]).  Concerns you have had regarding unusual vaginal discharge.  History of pelvic pain.  Your use of birth control. BEFORE THE PROCEDURE  Ask your caregiver when to schedule your Pap test. It is best not to be on your period if your caregiver uses a wooden spatula to collect cells or applies cells to a glass slide. Newer techniques are not so sensitive to the timing of a menstrual cycle.  Do not douche or have sexual intercourse for 24 hours before the test.   Do not use vaginal creams or tampons for 24 hours before the test.   Empty your bladder just before the test to lessen any discomfort.  PROCEDURE You will lie on an exam table with your  feet in stirrups. A warm metal or plastic instrument (speculum) is placed in your vagina. This instrument allows your caregiver to see the inside of your vagina and look at your cervix. A small, plastic brush or wooden spatula is then used to collect cervical cells. These cells are placed in a lab specimen container. The cells are looked at under a microscope. A specialist will determine if the cells are normal.  AFTER THE PROCEDURE Make sure to get your test results.If your results come back abnormal, you may need further testing.  Document Released: 09/30/2002 Document Revised: 10/02/2011 Document Reviewed: 07/06/2011 [facility] Patient Information 2014 [place], [place].

## 2013-09-02 NOTE — Progress Notes (Signed)
Patient ID: Angie [name], female   DOB: 26-Oct-1945, 68 y.o.   MRN: [id number] Subjective:     Angie [name] is a 68 y.o. female and is here for a comprehensive physical exam. The patient reports no problems.  History   Social History  . Marital Status: Married    Spouse Name: N/A    Number of Children: N/A  . Years of Education: N/A   Occupational History  . Not on file.   Social History Main Topics  . Smoking status: Former Smoker    Quit date: 02/07/1987  . Smokeless tobacco: Not on file  . Alcohol Use: No  . Drug Use: No  . Sexual Activity: Not on file   Other Topics Concern  . Not on file   Social History Narrative  . No narrative on file   Health Maintenance  Topic Date Due  . Pneumococcal Polysaccharide Vaccine Age 52 And Over  04/20/2012  . Influenza Vaccine  10/21/2013  . Mammogram  02/28/2014  . Colonoscopy  05/24/2014  . Tetanus/tdap  05/22/2019  . Zostavax  Addressed    The following portions of the patient's history were reviewed and updated as appropriate: allergies, current medications, past family history, past medical history, past social history, past surgical history and problem list.  Review of Systems Pertinent items are noted in HPI.   Objective:  Physical Exam  Nursing note and vitals reviewed. Constitutional: She is oriented to person, place, and time. She appears well-developed. No distress.  HENT:  Head: Normocephalic.  Right Ear: External ear normal.  Left Ear: External ear normal.  Mouth/Throat: Oropharynx is clear and moist. No oropharyngeal exudate.  Eyes: EOM are normal. Right eye exhibits no discharge. Left eye exhibits no discharge.  Neck: Normal range of motion. Neck supple.  Cardiovascular: Normal rate, regular rhythm, normal heart sounds and intact distal pulses.  Exam reveals no friction rub.   No murmur heard. Pulmonary/Chest: Effort normal and breath sounds normal. No respiratory distress. She has no wheezes.  She exhibits no tenderness.  Abdominal: Soft. Bowel sounds are normal. She exhibits no distension and no mass. There is no tenderness.  Genitourinary: Vagina normal and uterus normal. Pelvic exam was performed with patient supine. No labial fusion. There is no rash, tenderness or lesion on the right labia. There is no rash, tenderness or lesion on the left labia. Cervix exhibits no motion tenderness. Right adnexum displays no mass, no tenderness and no fullness. Left adnexum displays no mass, no tenderness and no fullness. No vaginal discharge found.  Dry labia. Pap obtained  Musculoskeletal: Normal range of motion. She exhibits no edema.  Neurological: She is alert and oriented to person, place, and time. She has normal reflexes.  Skin: Skin is warm. No erythema.  Psychiatric: She has a normal mood and affect.    Assessment:    Healthy female exam. Normal exam Pap done today      Plan:     See After Visit Summary for Counseling Recommendations

## 2013-09-03 ENCOUNTER — Telehealth: Payer: Self-pay | Admitting: Family Medicine

## 2013-09-03 NOTE — Telephone Encounter (Signed)
Patient called back, PAP test result discussed with her.

## 2013-09-03 NOTE — Telephone Encounter (Signed)
Message left for patient to call back about Pap test result.

## 2014-03-04 DIAGNOSIS — H35369 Drusen (degenerative) of macula, unspecified eye: Secondary | ICD-10-CM | POA: Diagnosis not present

## 2014-03-04 DIAGNOSIS — H5200 Hypermetropia, unspecified eye: Secondary | ICD-10-CM | POA: Diagnosis not present

## 2014-03-04 DIAGNOSIS — H2510 Age-related nuclear cataract, unspecified eye: Secondary | ICD-10-CM | POA: Diagnosis not present

## 2014-03-09 ENCOUNTER — Telehealth: Payer: Self-pay | Admitting: Family Medicine

## 2014-03-09 DIAGNOSIS — G4700 Insomnia, unspecified: Secondary | ICD-10-CM

## 2014-03-09 MED ORDER — ESZOPICLONE 2 MG PO TABS: 2.0000 mg | ORAL_TABLET | Freq: Every evening | ORAL | Status: DC | PRN

## 2014-03-09 NOTE — Telephone Encounter (Signed)
Please inform patient her medication is ready for pick up. Also dose was reduced to 2 mg from 3 mg which is the recommended dose for her age.

## 2014-03-09 NOTE — Telephone Encounter (Signed)
Pt states that she was previously on this dose and that it doesn't work for her.  Informed that I would send message back to MD. Angie [name]

## 2014-03-09 NOTE — Telephone Encounter (Signed)
Pt is aware of this and will pick up the rx for her 2mg  and make due.  " doesn't want to come in and pay another office visit fee."  [facility]

## 2014-03-09 NOTE — Telephone Encounter (Signed)
Pt called and would like a refill of her sleep medication left up front for pick up. Please call when ready. jw

## 2014-03-09 NOTE — Telephone Encounter (Signed)
Requesting refill of sleep medication. Medication reviewed, she had been on medication for a while at a higher than usual recommended dose of 3 mg daily. I will change this to 2 mg qhs prn. Prescription printed and placed up front for pick up. I will alert clinical support team to inform patient her prescription is ready for pick up.

## 2014-03-09 NOTE — Telephone Encounter (Signed)
Recommendation daily dose for her age is 2 mg, she can come in to discuss alternative.

## 2014-04-01 DIAGNOSIS — I8310 Varicose veins of unspecified lower extremity with inflammation: Secondary | ICD-10-CM | POA: Diagnosis not present

## 2014-04-01 DIAGNOSIS — M79609 Pain in unspecified limb: Secondary | ICD-10-CM | POA: Diagnosis not present

## 2014-04-07 DIAGNOSIS — M79609 Pain in unspecified limb: Secondary | ICD-10-CM | POA: Diagnosis not present

## 2014-04-07 DIAGNOSIS — I8310 Varicose veins of unspecified lower extremity with inflammation: Secondary | ICD-10-CM | POA: Diagnosis not present

## 2014-04-14 DIAGNOSIS — M79609 Pain in unspecified limb: Secondary | ICD-10-CM | POA: Diagnosis not present

## 2014-04-14 DIAGNOSIS — I8310 Varicose veins of unspecified lower extremity with inflammation: Secondary | ICD-10-CM | POA: Diagnosis not present

## 2014-05-05 ENCOUNTER — Other Ambulatory Visit: Payer: Self-pay | Admitting: Family Medicine

## 2014-05-05 DIAGNOSIS — Z1231 Encounter for screening mammogram for malignant neoplasm of breast: Secondary | ICD-10-CM

## 2014-05-12 DIAGNOSIS — Z1211 Encounter for screening for malignant neoplasm of colon: Secondary | ICD-10-CM | POA: Diagnosis not present

## 2014-05-12 DIAGNOSIS — K5730 Diverticulosis of large intestine without perforation or abscess without bleeding: Secondary | ICD-10-CM | POA: Diagnosis not present

## 2014-05-12 DIAGNOSIS — K648 Other hemorrhoids: Secondary | ICD-10-CM | POA: Diagnosis not present

## 2014-05-12 DIAGNOSIS — K219 Gastro-esophageal reflux disease without esophagitis: Secondary | ICD-10-CM | POA: Diagnosis not present

## 2014-05-15 DIAGNOSIS — I8311 Varicose veins of right lower extremity with inflammation: Secondary | ICD-10-CM | POA: Diagnosis not present

## 2014-05-15 DIAGNOSIS — M79604 Pain in right leg: Secondary | ICD-10-CM | POA: Diagnosis not present

## 2014-05-15 DIAGNOSIS — M79651 Pain in right thigh: Secondary | ICD-10-CM | POA: Diagnosis not present

## 2014-05-15 DIAGNOSIS — R600 Localized edema: Secondary | ICD-10-CM | POA: Diagnosis not present

## 2014-05-18 DIAGNOSIS — R600 Localized edema: Secondary | ICD-10-CM | POA: Diagnosis not present

## 2014-05-18 DIAGNOSIS — M79651 Pain in right thigh: Secondary | ICD-10-CM | POA: Diagnosis not present

## 2014-05-18 DIAGNOSIS — M79604 Pain in right leg: Secondary | ICD-10-CM | POA: Diagnosis not present

## 2014-05-18 DIAGNOSIS — I8311 Varicose veins of right lower extremity with inflammation: Secondary | ICD-10-CM | POA: Diagnosis not present

## 2014-05-29 ENCOUNTER — Ambulatory Visit (HOSPITAL_COMMUNITY)
Admission: RE | Admit: 2014-05-29 | Discharge: 2014-05-29 | Disposition: A | Discharge status: Home / Self Care | Payer: Medicare Other | Source: Ambulatory Visit | Attending: Family Medicine | Admitting: Family Medicine

## 2014-05-29 ENCOUNTER — Other Ambulatory Visit: Payer: Self-pay | Admitting: Family Medicine

## 2014-05-29 DIAGNOSIS — Z1231 Encounter for screening mammogram for malignant neoplasm of breast: Secondary | ICD-10-CM | POA: Diagnosis not present

## 2014-06-01 ENCOUNTER — Encounter: Payer: Self-pay | Admitting: Family Medicine

## 2014-06-05 DIAGNOSIS — M79651 Pain in right thigh: Secondary | ICD-10-CM | POA: Diagnosis not present

## 2014-06-05 DIAGNOSIS — I8311 Varicose veins of right lower extremity with inflammation: Secondary | ICD-10-CM | POA: Diagnosis not present

## 2014-06-05 DIAGNOSIS — M79604 Pain in right leg: Secondary | ICD-10-CM | POA: Diagnosis not present

## 2014-06-17 DIAGNOSIS — I8311 Varicose veins of right lower extremity with inflammation: Secondary | ICD-10-CM | POA: Diagnosis not present

## 2014-07-01 DIAGNOSIS — I8312 Varicose veins of left lower extremity with inflammation: Secondary | ICD-10-CM | POA: Diagnosis not present

## 2014-08-04 ENCOUNTER — Ambulatory Visit: Payer: Medicare Other | Admitting: Family Medicine

## 2014-08-05 ENCOUNTER — Telehealth: Payer: Self-pay | Admitting: *Deleted

## 2014-08-05 NOTE — Telephone Encounter (Signed)
Received a call from [facility].  Pt is having a colonoscopy 09/04/14 by Dr. Juanita Craver. Pt will silverback referral. Please give her a call at [phone]. Derl Barrow, RN

## 2014-08-27 NOTE — Telephone Encounter (Signed)
Authorization received #[id number] good from 08/27/14-02/23/15.  Melinda at [employer] is aware.  Jazmin Hartsell,CMA

## 2014-09-08 ENCOUNTER — Encounter: Payer: Self-pay | Admitting: Family Medicine

## 2014-09-08 ENCOUNTER — Ambulatory Visit (HOSPITAL_COMMUNITY)
Admission: RE | Admit: 2014-09-08 | Discharge: 2014-09-08 | Disposition: A | Discharge status: Home / Self Care | Payer: Medicare Other | Source: Ambulatory Visit | Attending: Family Medicine | Admitting: Family Medicine

## 2014-09-08 ENCOUNTER — Ambulatory Visit (INDEPENDENT_AMBULATORY_CARE_PROVIDER_SITE_OTHER): Payer: Medicare Other | Admitting: Family Medicine

## 2014-09-08 VITALS — BP 135/78 | HR 78 | Temp 98.3°F | Wt 221.0 lb

## 2014-09-08 DIAGNOSIS — R002 Palpitations: Secondary | ICD-10-CM | POA: Insufficient documentation

## 2014-09-08 DIAGNOSIS — Z0000 Encounter for general adult medical examination without abnormal findings: Secondary | ICD-10-CM

## 2014-09-08 DIAGNOSIS — I10 Essential (primary) hypertension: Secondary | ICD-10-CM

## 2014-09-08 DIAGNOSIS — E2839 Other primary ovarian failure: Secondary | ICD-10-CM

## 2014-09-08 DIAGNOSIS — Z23 Encounter for immunization: Secondary | ICD-10-CM

## 2014-09-08 DIAGNOSIS — R000 Tachycardia, unspecified: Secondary | ICD-10-CM | POA: Diagnosis not present

## 2014-09-08 HISTORY — DX: Palpitations: R00.2

## 2014-09-08 LAB — BASIC METABOLIC PANEL
BUN: 16 mg/dL (ref 6–23)
CO2: 32 mEq/L (ref 19–32)
Calcium: 10 mg/dL (ref 8.4–10.5)
Chloride: 101 mEq/L (ref 96–112)
Creat: 0.72 mg/dL (ref 0.50–1.10)
Glucose, Bld: 109 mg/dL — ABNORMAL HIGH (ref 70–99)
Potassium: 4 mEq/L (ref 3.5–5.3)
Sodium: 139 mEq/L (ref 135–145)

## 2014-09-08 LAB — LIPID PANEL
Cholesterol: 216 mg/dL — ABNORMAL HIGH (ref 0–200)
HDL: 60 mg/dL (ref >39)
LDL Cholesterol: 129 mg/dL — ABNORMAL HIGH (ref 0–99)
Total CHOL/HDL Ratio: 3.6 Ratio
Triglycerides: 137 mg/dL (ref <150)
VLDL: 27 mg/dL (ref 0–40)

## 2014-09-08 MED ORDER — ESZOPICLONE 2 MG PO TABS: 2.0000 mg | ORAL_TABLET | Freq: Every evening | ORAL | Status: DC | PRN

## 2014-09-08 MED ORDER — ESOMEPRAZOLE MAGNESIUM 40 MG PO CPDR: 40.0000 mg | DELAYED_RELEASE_CAPSULE | Freq: Every day | ORAL | Status: DC

## 2014-09-08 MED ORDER — GABAPENTIN 300 MG PO CAPS: 600.0000 mg | ORAL_CAPSULE | Freq: Every day | ORAL | Status: DC

## 2014-09-08 MED ORDER — HYDROCHLOROTHIAZIDE 25 MG PO TABS: 25.0000 mg | ORAL_TABLET | Freq: Every day | ORAL | Status: DC

## 2014-09-08 MED ORDER — VALACYCLOVIR HCL 500 MG PO TABS: 500.0000 mg | ORAL_TABLET | Freq: Every day | ORAL | Status: DC

## 2014-09-08 NOTE — Assessment & Plan Note (Signed)
She will reschedule colonoscopy and obtain mammogram report for [place]. Up to date with PAP. She got Prevnar today. RTC in 1 yr for Pneumovax. Instruction given to schedule Dexa scan and an order was placed for this. Most of her medications were refilled today.

## 2014-09-08 NOTE — Assessment & Plan Note (Signed)
BP looks good today. Bmet, Lipid profile checked today. I refilled all her medications.

## 2014-09-08 NOTE — Assessment & Plan Note (Addendum)
Might be related to caffeine intake. EKG done today: NSR with normal rate. TSH checked today, I will call her with result. Patient advised to abstain from caffeinated coffee. If no improvement we will obtain holter monitoring. She agreed with plan.

## 2014-09-08 NOTE — Progress Notes (Signed)
Subjective:     Patient ID: Angie [name], female   DOB: December 29, 1945, 69 y.o.   MRN: [id number]  HPI  Heart flipping:C/O feeling as if her heart is flipping, this occurs mostly at night but also during the day. Last episode was few minutes ago, each episode last 20 min - 1 hr, she denies associated chest pain, no SOB, no diaphoresis. Denies change in medication or diet change. She had similar episode years ago when she was taking coffee. She stopped coffee for 15 yrs and her symptoms resolved, she recently started taking coffee again. [id number] with medication, she need refill of her medicine. HM: Here for follow up, she is up to date with PAP, she went for colonoscopy yesterday but had to reschedule due to insurance issue.She claimed she had mammogram 2 months ago at [facility] which was normal. She has not had Dexa scan in years. Denies any other concern.  Current Outpatient Prescriptions on File Prior to Visit  Medication Sig Dispense Refill  . esomeprazole (NEXIUM) 40 MG capsule Take 1 capsule (40 mg total) by mouth daily at 12 noon. 90 capsule 2  . eszopiclone (LUNESTA) 2 MG TABS tablet Take 1 tablet (2 mg total) by mouth at bedtime as needed for sleep. Take immediately before bedtime 30 tablet 1  . gabapentin (NEURONTIN) 300 MG capsule Take 2 capsules (600 mg total) by mouth at bedtime. take two tablets before bed for hot flashes 180 capsule 3  . hydrochlorothiazide (HYDRODIURIL) 25 MG tablet Take 1 tablet (25 mg total) by mouth daily. 90 tablet 3  . valACYclovir (VALTREX) 500 MG tablet Take 1 tablet (500 mg total) by mouth daily. 90 tablet 3  . albuterol (PROVENTIL HFA;VENTOLIN HFA) 108 (90 BASE) MCG/ACT inhaler Inhale 2 puffs into the lungs every 4 (four) hours as needed for wheezing or shortness of breath. (Patient not taking: Reported on 09/08/2014) 18 g 1  . Elastic Bandages & Supports (FUTURO FIRM COMPRESSION HOSE) MISC Ted Hose, medium compression     . SUMAtriptan  (IMITREX) 25 MG tablet Take 1 tablet (25 mg total) by mouth every 2 (two) hours as needed for migraine. take one tablet at start of headache.  May repeat in 2 hours x 1 if needed. (Patient not taking: Reported on 09/08/2014) 10 tablet 3   No current facility-administered medications on file prior to visit.   Past Medical History  Diagnosis Date  . Hypertension   . Diverticulosis   . Herpes zoster   . Migraine   . Chronic back pain   . Hyperlipidemia      Review of Systems  Respiratory: Negative.   Cardiovascular: Positive for palpitations. Negative for chest pain and leg swelling.  Gastrointestinal: Negative.   Genitourinary: Negative.   All other systems reviewed and are negative.  Filed Vitals:   09/08/14 1056  BP: 135/78  Pulse: 78  Temp: 98.3 F (36.8 C)  TempSrc: Oral  Weight: 221 lb (100.245 kg)       Objective:   Physical Exam  Constitutional: She appears well-developed. No distress.  Cardiovascular: Normal rate, regular rhythm, normal heart sounds and intact distal pulses.  Exam reveals no gallop and no friction rub.   No murmur heard. Pulmonary/Chest: Effort normal and breath sounds normal. No respiratory distress. She has no wheezes.  Abdominal: Soft. Bowel sounds are normal. She exhibits no distension and no mass. There is no tenderness.  Musculoskeletal: Normal range of motion. She exhibits no edema.  Neurological: She is alert.  Nursing note and vitals reviewed.      Assessment:     Palpitation HTN: HM:    Plan:     Check problem list.

## 2014-09-08 NOTE — Patient Instructions (Addendum)
It was nice seeing your, the symptom you described to me sounds like palpitation, this could be due to various reasons, one which is irregular heart beat. EKG done today was normal and your hear rate is fine as well. I think your symptom is largely related to drinking coffee. I will recommend you stay off coffee for now or take decarfinated ones. If symptoms persist we might need an Holter monitor or for you to see a cardiologist. Call soon if still having any concern.  Palpitations A palpitation is the feeling that your heartbeat is irregular. It may feel like your heart is fluttering or skipping a beat. It may also feel like your heart is beating faster than normal. This is usually not a serious problem. In some cases, you may need more medical tests. HOME CARE  Avoid:  Caffeine in coffee, tea, soft drinks, diet pills, and energy drinks.  Chocolate.  Alcohol.  Stop smoking if you smoke.  Reduce your stress and anxiety. Try:  A method that measures bodily functions so you can learn to control them (biofeedback).  Yoga.  Meditation.  Physical activity such as swimming, jogging, or walking.  Get plenty of rest and sleep. GET HELP IF:  Your fast or irregular heartbeat continues after 24 hours.  Your palpitations occur more often. GET HELP RIGHT AWAY IF:   You have chest pain.  You feel short of breath.  You have a very bad headache.  You feel dizzy or pass out (faint). MAKE SURE YOU:   Understand these instructions.  Will watch your condition.  Will get help right away if you are not doing well or get worse. Document Released: 04/18/2008 Document Revised: 11/24/2013 Document Reviewed: 09/08/2011 [facility] Patient Information 2015 [place], [place]. This information is not intended to replace advice given to you by your health care provider. Make sure you discuss any questions you have with your health care provider.

## 2014-09-09 ENCOUNTER — Telehealth: Payer: Self-pay | Admitting: Family Medicine

## 2014-09-09 ENCOUNTER — Encounter: Payer: Self-pay | Admitting: Family Medicine

## 2014-09-09 ENCOUNTER — Other Ambulatory Visit: Payer: Self-pay | Admitting: Family Medicine

## 2014-09-09 LAB — TSH: TSH: 2.048 u[IU]/mL (ref 0.350–4.500)

## 2014-09-09 MED ORDER — ATORVASTATIN CALCIUM 20 MG PO TABS: 20.0000 mg | ORAL_TABLET | Freq: Every day | ORAL | Status: DC

## 2014-09-09 NOTE — Telephone Encounter (Signed)
Message left for her to call back about test result.  Test normal except for high cholesterol. Her 10 yr cardiovascular risk is about 12%. She need to be on Statin.  Blue team staff to check which pharmacy she will like Statin to be called in to.

## 2014-09-09 NOTE — Telephone Encounter (Signed)
Patient returned call. I discussed her cholesterol result with her. Due to hx of HTN  And elevated FLP I recommended Statin. Her 10 yrs CV risk is about 12.% Start Lipitor 20 mg qd.  She will come pick up hard copy of her script. Script left up front for pick up.

## 2014-09-10 NOTE — Telephone Encounter (Signed)
LM for patient to call back.  Is this something that we can send her a mychart message about? Jazmin Hartsell,CMA

## 2014-09-10 NOTE — Telephone Encounter (Signed)
I already spoke with her yesterday. Thanks.

## 2014-09-15 DIAGNOSIS — I8312 Varicose veins of left lower extremity with inflammation: Secondary | ICD-10-CM | POA: Diagnosis not present

## 2014-09-22 DIAGNOSIS — I8312 Varicose veins of left lower extremity with inflammation: Secondary | ICD-10-CM | POA: Diagnosis not present

## 2014-09-24 DIAGNOSIS — I8312 Varicose veins of left lower extremity with inflammation: Secondary | ICD-10-CM | POA: Diagnosis not present

## 2014-10-27 DIAGNOSIS — I8312 Varicose veins of left lower extremity with inflammation: Secondary | ICD-10-CM | POA: Diagnosis not present

## 2014-12-25 ENCOUNTER — Ambulatory Visit (INDEPENDENT_AMBULATORY_CARE_PROVIDER_SITE_OTHER): Payer: Medicare Other | Admitting: Family Medicine

## 2014-12-25 ENCOUNTER — Telehealth: Payer: Self-pay | Admitting: Family Medicine

## 2014-12-25 ENCOUNTER — Encounter: Payer: Self-pay | Admitting: Family Medicine

## 2014-12-25 VITALS — BP 149/62 | HR 78 | Temp 98.4°F | Ht 63.0 in | Wt 218.0 lb

## 2014-12-25 DIAGNOSIS — N952 Postmenopausal atrophic vaginitis: Secondary | ICD-10-CM | POA: Insufficient documentation

## 2014-12-25 DIAGNOSIS — B379 Candidiasis, unspecified: Secondary | ICD-10-CM | POA: Diagnosis not present

## 2014-12-25 DIAGNOSIS — N904 Leukoplakia of vulva: Secondary | ICD-10-CM | POA: Diagnosis not present

## 2014-12-25 MED ORDER — FLUCONAZOLE 150 MG PO TABS: 150.0000 mg | ORAL_TABLET | Freq: Once | ORAL | Status: DC

## 2014-12-25 MED ORDER — ESTRADIOL 0.1 MG/GM VA CREA: TOPICAL_CREAM | VAGINAL | Status: DC

## 2014-12-25 NOTE — Telephone Encounter (Signed)
[name] called back to say the rx for the premarin was too expensive.  She wanted to know if she could get a hard copy of the rx and take it to the [place] to obtain at not charge to her.

## 2014-12-25 NOTE — Patient Instructions (Signed)
I have called in a medication, it is a cream for you to apply intravaginally one time a day for 2 weeks, and then only 2 times a week. We will follow-up with this with your PCP in one month. I have also called in a medication called Diflucan, this is for you to take once for any type of yeast that may be causing her irritation.

## 2014-12-25 NOTE — Progress Notes (Signed)
   Subjective:    Patient ID: Tanielle Reid-White, female    DOB: 09-Feb-1946, 69 y.o.   MRN: [id number]  HPI  Vaginal swelling: [facility] vaginal swelling and discomfort. Patient states that she "itches "all the time on the right side of her labia majora, this has been occurring for at least 2 months. On Saturday after urinating she noticed a little bit of blood on her toilet tissue. Upon investigation she noticed a small black scab-like area on her right labia. She has noticed no drainage from this area but since then it has become increasingly swollen and tender. She attempted to try warm compresses, and feels that the area is now more swollen and has bad odor. She denies fever, chills, erythema, or abdominal pain.  Former smoker Past Medical History  Diagnosis Date  . Hypertension   . Diverticulosis   . Herpes zoster   . Migraine   . Chronic back pain   . Hyperlipidemia    No Known Allergies No past surgical history on file.   Review of Systems Per history of present illness    Objective:   Physical Exam BP 149/62 mmHg  Pulse 78  Temp(Src) 98.4 F (36.9 C) (Oral)  Ht 5\' 3"  (1.6 m)  Wt 218 lb (98.884 kg)  BMI 38.63 kg/m2 Gen: NAD. Nontoxic in appearance, well-developed, well-nourished, obese. Abd: Soft. Round. NTND. BS present. No Masses palpated.   GYN:  External genitalia hypopigmented, dry, desk or lesions in linear scratch marks.  Vaginal mucosa pale pink.        Assessment & Plan:  Katena Reid-White is a 69 y.o. AAF with complaints of vaginal swelling and irritation. Patient's exam was consistent with atrophic vaginitis. It is obvious by exam that she had been scratching at the area, and she is bruised. - Discussed the nature of atrophic vaginitis, and the use of topical estrogen-containing creams. Patient would like to try this at this time. Prescribed Estrace. Also call in Diflucan for the possibility  of a yeast infection as well. - Pt has lichen sclerosis of the vulva as well. Will do a trial of clobetasol cream. Patient had called back and stated she could not afford the Estrace. This may give her some temporary relief as well. - Patient to follow-up with PCP in 4 weeks.

## 2014-12-28 MED ORDER — CLOBETASOL PROP EMOLLIENT BASE 0.05 % EX CREA: TOPICAL_CREAM | CUTANEOUS | Status: DC

## 2014-12-28 MED ORDER — ESTRADIOL 0.1 MG/GM VA CREA: TOPICAL_CREAM | VAGINAL | Status: DC

## 2014-12-28 NOTE — Progress Notes (Signed)
One of the assigned preceptor. 

## 2014-12-28 NOTE — Telephone Encounter (Signed)
Copy is made and placed out front behind desk. Please make patient aware. Thanks.

## 2014-12-28 NOTE — Assessment & Plan Note (Signed)
Will do a trial of clobetasol

## 2014-12-28 NOTE — Assessment & Plan Note (Signed)
Angie [name] is a 69 y.o. AAF with complaints of vaginal swelling and irritation. Patient's exam was consistent with atrophic vaginitis. It is obvious by exam that she had been scratching at the area, and she is bruised. - Discussed the nature of atrophic vaginitis, and the use of topical estrogen-containing creams. Patient would like to try this at this time. Prescribed Estrace. Also call in Diflucan for the possibility of a yeast infection as well. - Patient to follow-up with PCP in 4 weeks.

## 2014-12-28 NOTE — Telephone Encounter (Signed)
I have also placed a copy of the Estrace cream prescription out front for her to pickup. Please call patient back and inform her that I have also called in a sterile right cream for her today, this may give her some relief from the itching. This should not be expensive, and will provide her some relief. Thank you.

## 2014-12-28 NOTE — Telephone Encounter (Signed)
LMOVM for pt to return call.  Will inform of 2 rx's and new steroid cream sent to pharmacy. Fleeger, Salome Spotted

## 2014-12-30 NOTE — Telephone Encounter (Signed)
Attempted to call again.  LMOVM again. Fleeger, Salome Spotted

## 2015-02-04 DIAGNOSIS — B351 Tinea unguium: Secondary | ICD-10-CM | POA: Diagnosis not present

## 2015-02-04 DIAGNOSIS — L02612 Cutaneous abscess of left foot: Secondary | ICD-10-CM | POA: Diagnosis not present

## 2015-02-04 DIAGNOSIS — L03032 Cellulitis of left toe: Secondary | ICD-10-CM | POA: Diagnosis not present

## 2015-02-08 DIAGNOSIS — M75101 Unspecified rotator cuff tear or rupture of right shoulder, not specified as traumatic: Secondary | ICD-10-CM | POA: Diagnosis not present

## 2015-02-08 DIAGNOSIS — M160 Bilateral primary osteoarthritis of hip: Secondary | ICD-10-CM | POA: Diagnosis not present

## 2015-02-08 DIAGNOSIS — M47816 Spondylosis without myelopathy or radiculopathy, lumbar region: Secondary | ICD-10-CM | POA: Diagnosis not present

## 2015-02-16 DIAGNOSIS — L02619 Cutaneous abscess of unspecified foot: Secondary | ICD-10-CM | POA: Diagnosis not present

## 2015-02-16 DIAGNOSIS — B351 Tinea unguium: Secondary | ICD-10-CM | POA: Diagnosis not present

## 2015-02-16 DIAGNOSIS — L02612 Cutaneous abscess of left foot: Secondary | ICD-10-CM | POA: Diagnosis not present

## 2015-02-16 DIAGNOSIS — L609 Nail disorder, unspecified: Secondary | ICD-10-CM | POA: Diagnosis not present

## 2015-02-16 DIAGNOSIS — L03032 Cellulitis of left toe: Secondary | ICD-10-CM | POA: Diagnosis not present

## 2015-03-11 DIAGNOSIS — L03031 Cellulitis of right toe: Secondary | ICD-10-CM | POA: Diagnosis not present

## 2015-03-11 DIAGNOSIS — B353 Tinea pedis: Secondary | ICD-10-CM | POA: Diagnosis not present

## 2015-03-11 DIAGNOSIS — L02611 Cutaneous abscess of right foot: Secondary | ICD-10-CM | POA: Diagnosis not present

## 2015-03-22 DIAGNOSIS — L02611 Cutaneous abscess of right foot: Secondary | ICD-10-CM | POA: Diagnosis not present

## 2015-03-22 DIAGNOSIS — L03031 Cellulitis of right toe: Secondary | ICD-10-CM | POA: Diagnosis not present

## 2015-03-22 DIAGNOSIS — B351 Tinea unguium: Secondary | ICD-10-CM | POA: Diagnosis not present

## 2015-04-27 DIAGNOSIS — I87323 Chronic venous hypertension (idiopathic) with inflammation of bilateral lower extremity: Secondary | ICD-10-CM | POA: Diagnosis not present

## 2015-06-11 ENCOUNTER — Other Ambulatory Visit: Payer: Self-pay

## 2015-06-11 DIAGNOSIS — Z1231 Encounter for screening mammogram for malignant neoplasm of breast: Secondary | ICD-10-CM

## 2015-06-22 ENCOUNTER — Other Ambulatory Visit: Payer: Self-pay | Admitting: Family Medicine

## 2015-06-22 ENCOUNTER — Encounter: Payer: Self-pay | Admitting: Family Medicine

## 2015-06-22 ENCOUNTER — Ambulatory Visit (INDEPENDENT_AMBULATORY_CARE_PROVIDER_SITE_OTHER): Payer: Medicare Other | Admitting: Family Medicine

## 2015-06-22 VITALS — BP 126/76 | HR 85 | Temp 98.0°F | Ht 63.0 in | Wt 221.2 lb

## 2015-06-22 DIAGNOSIS — N951 Menopausal and female climacteric states: Secondary | ICD-10-CM | POA: Diagnosis not present

## 2015-06-22 DIAGNOSIS — Z206 Contact with and (suspected) exposure to human immunodeficiency virus [HIV]: Secondary | ICD-10-CM

## 2015-06-22 DIAGNOSIS — H2513 Age-related nuclear cataract, bilateral: Secondary | ICD-10-CM | POA: Diagnosis not present

## 2015-06-22 DIAGNOSIS — Z119 Encounter for screening for infectious and parasitic diseases, unspecified: Secondary | ICD-10-CM

## 2015-06-22 DIAGNOSIS — G4700 Insomnia, unspecified: Secondary | ICD-10-CM | POA: Diagnosis not present

## 2015-06-22 DIAGNOSIS — Z1211 Encounter for screening for malignant neoplasm of colon: Secondary | ICD-10-CM | POA: Insufficient documentation

## 2015-06-22 DIAGNOSIS — Z20828 Contact with and (suspected) exposure to other viral communicable diseases: Secondary | ICD-10-CM | POA: Diagnosis not present

## 2015-06-22 DIAGNOSIS — K219 Gastro-esophageal reflux disease without esophagitis: Secondary | ICD-10-CM | POA: Diagnosis not present

## 2015-06-22 DIAGNOSIS — I10 Essential (primary) hypertension: Secondary | ICD-10-CM | POA: Diagnosis not present

## 2015-06-22 DIAGNOSIS — H04123 Dry eye syndrome of bilateral lacrimal glands: Secondary | ICD-10-CM | POA: Diagnosis not present

## 2015-06-22 DIAGNOSIS — E785 Hyperlipidemia, unspecified: Secondary | ICD-10-CM

## 2015-06-22 DIAGNOSIS — Z114 Encounter for screening for human immunodeficiency virus [HIV]: Secondary | ICD-10-CM | POA: Diagnosis present

## 2015-06-22 HISTORY — DX: Insomnia, unspecified: G47.00

## 2015-06-22 MED ORDER — SUMATRIPTAN SUCCINATE 25 MG PO TABS: ORAL_TABLET | ORAL | Status: DC

## 2015-06-22 MED ORDER — ESZOPICLONE 2 MG PO TABS: 2.0000 mg | ORAL_TABLET | Freq: Every evening | ORAL | Status: DC | PRN

## 2015-06-22 MED ORDER — HYDROCHLOROTHIAZIDE 25 MG PO TABS: 25.0000 mg | ORAL_TABLET | Freq: Every day | ORAL | Status: DC

## 2015-06-22 MED ORDER — VALACYCLOVIR HCL 500 MG PO TABS: 500.0000 mg | ORAL_TABLET | Freq: Every day | ORAL | Status: DC

## 2015-06-22 MED ORDER — PRAVASTATIN SODIUM 20 MG PO TABS: 20.0000 mg | ORAL_TABLET | Freq: Every day | ORAL | Status: DC

## 2015-06-22 MED ORDER — ESOMEPRAZOLE MAGNESIUM 40 MG PO CPDR
40.0000 mg | DELAYED_RELEASE_CAPSULE | Freq: Every day | ORAL | Status: DC
Start: 2015-06-22 — End: 2015-11-19

## 2015-06-22 MED ORDER — GABAPENTIN 300 MG PO CAPS: 600.0000 mg | ORAL_CAPSULE | Freq: Every day | ORAL | Status: DC

## 2015-06-22 NOTE — Assessment & Plan Note (Signed)
She is up to date with her PAP. She already scheduled mammogram for next month. I referred her to GI for colon cancer screening.

## 2015-06-22 NOTE — Assessment & Plan Note (Signed)
Non-compliant with Lipitor due to intolerance. I will have her try Pravachol.  Medication refilled.

## 2015-06-22 NOTE — Assessment & Plan Note (Signed)
Controlled on gabapentin. I refilled her med.

## 2015-06-22 NOTE — Assessment & Plan Note (Signed)
BP well controlled on meds.  I refilled her HCTZ.

## 2015-06-22 NOTE — Assessment & Plan Note (Signed)
Intermittent symptoms. Now worsened since off Nexium. I refilled her Nexium today.

## 2015-06-22 NOTE — Progress Notes (Signed)
Subjective:     Patient ID: Angie [name], female   DOB: [birth year], 69 y.o.   MRN: [id number]  HPI HTN/HLD: She is compliant with her HCTZ, need refill. She stopped taking her Lipitor due to muscle cramping. She will like to try another medication for her cholesterol. GERD: Off her Nexium since she is out of it, now she is having reflux symptoms. Denies any other GI symptoms. She need refill of her med. Hot Flashes: She had this problem since 20 yrs ago. She is somewhat controlled on Gabapentin. She is requesting refill of her med. HM: Due for colonoscopy and mammogram. She stated she has her mammogram scheduled for next month.  Current Outpatient Prescriptions on File Prior to Visit  Medication Sig Dispense Refill  . albuterol (PROVENTIL HFA;VENTOLIN HFA) 108 (90 BASE) MCG/ACT inhaler Inhale 2 puffs into the lungs every 4 (four) hours as needed for wheezing or shortness of breath. (Patient not taking: Reported on 09/08/2014) 18 g 1  . atorvastatin (LIPITOR) 20 MG tablet Take 1 tablet (20 mg total) by mouth daily. 90 tablet 2  . Clobetasol Prop Emollient Base (CLOBETASOL PROPIONATE E) 0.05 % emollient cream Apply twice a day to irritate area until improvement 30 g 0  . Elastic Bandages & Supports (FUTURO FIRM COMPRESSION HOSE) MISC Ted Hose, medium compression     . esomeprazole (NEXIUM) 40 MG capsule Take 1 capsule (40 mg total) by mouth daily at 12 noon. 90 capsule 1  . estradiol (ESTRACE) 0.1 MG/GM vaginal cream 1 applicatorful daily at bedtime for 2 weeks, then 2 times a week 42.5 g 0  . eszopiclone (LUNESTA) 2 MG TABS tablet Take 1 tablet (2 mg total) by mouth at bedtime as needed for sleep. Take immediately before bedtime 30 tablet 2  . fluconazole (DIFLUCAN) 150 MG tablet Take 1 tablet (150 mg total) by mouth once. 1 tablet 0  . gabapentin (NEURONTIN) 300 MG capsule Take 2 capsules (600 mg total) by mouth at bedtime. take two tablets before bed for hot flashes 180 capsule 2  .  hydrochlorothiazide (HYDRODIURIL) 25 MG tablet Take 1 tablet (25 mg total) by mouth daily. 90 tablet 2  . SUMAtriptan (IMITREX) 25 MG tablet Take 1 tablet (25 mg total) by mouth every 2 (two) hours as needed for migraine. take one tablet at start of headache.  May repeat in 2 hours x 1 if needed. (Patient not taking: Reported on 09/08/2014) 10 tablet 3  . traMADol (ULTRAM) 50 MG tablet Take by mouth every 6 (six) hours as needed.    . valACYclovir (VALTREX) 500 MG tablet Take 1 tablet (500 mg total) by mouth daily. 90 tablet 2   No current facility-administered medications on file prior to visit.   Past Medical History  Diagnosis Date  . Hypertension   . Diverticulosis   . Herpes zoster   . Migraine   . Chronic back pain   . Hyperlipidemia      Review of Systems  Respiratory: Negative.   Cardiovascular: Negative.   Gastrointestinal: Negative for nausea, vomiting, diarrhea, constipation and blood in stool.       Reflux symptoms.  Genitourinary: Negative.   All other systems reviewed and are negative.      Filed Vitals:   06/22/15 0900 06/22/15 0929  BP: 146/79 126/76  Pulse: 85   Temp: 98 F (36.7 C)   TempSrc: Oral   Height: 5\' 3"  (1.6 m)   Weight: 221 lb 3 oz (100.33 kg)  Objective:   Physical Exam  Constitutional: She is oriented to person, place, and time. She appears well-developed. No distress.  Cardiovascular: Normal rate, regular rhythm, normal heart sounds and intact distal pulses.   No murmur heard. Pulmonary/Chest: Effort normal and breath sounds normal. No respiratory distress. She has no wheezes.  Abdominal: Soft. Bowel sounds are normal. She exhibits no distension and no mass. There is no tenderness.  Musculoskeletal: Normal range of motion. She exhibits no edema.  Neurological: She is alert and oriented to person, place, and time. No cranial nerve deficit.  Nursing note and vitals reviewed.      Assessment:     HTN: HLD: GERD: Hot  Flashes: HM:    Plan:     Check problem list.

## 2015-06-22 NOTE — Patient Instructions (Signed)
Colonoscopy, Care After °Refer to this sheet in the next few weeks. These instructions provide you with information on caring for yourself after your procedure. Your health care provider may also give you more specific instructions. Your treatment has been planned according to current medical practices, but problems sometimes occur. Call your health care provider if you have any problems or questions after your procedure. °WHAT TO EXPECT AFTER THE PROCEDURE  °After your procedure, it is typical to have the following: °· A small amount of blood in your stool. °· Moderate amounts of gas and mild abdominal cramping or bloating. °HOME CARE INSTRUCTIONS °· Do not drive, operate machinery, or sign important documents for 24 hours. °· You may shower and resume your regular physical activities, but move at a slower pace for the first 24 hours. °· Take frequent rest periods for the first 24 hours. °· Walk around or put a warm pack on your abdomen to help reduce abdominal cramping and bloating. °· Drink enough fluids to keep your urine clear or pale yellow. °· You may resume your normal diet as instructed by your health care provider. Avoid heavy or fried foods that are hard to digest. °· Avoid drinking alcohol for 24 hours or as instructed by your health care provider. °· Only take over-the-counter or prescription medicines as directed by your health care provider. °· If a tissue sample (biopsy) was taken during your procedure: °¨ Do not take aspirin or blood thinners for 7 days, or as instructed by your health care provider. °¨ Do not drink alcohol for 7 days, or as instructed by your health care provider. °¨ Eat soft foods for the first 24 hours. °SEEK MEDICAL CARE IF: °You have persistent spotting of blood in your stool 2-3 days after the procedure. °SEEK IMMEDIATE MEDICAL CARE IF: °· You have more than a small spotting of blood in your stool. °· You pass large blood clots in your stool. °· Your abdomen is swollen  (distended). °· You have nausea or vomiting. °· You have a fever. °· You have increasing abdominal pain that is not relieved with medicine. °  °This information is not intended to replace advice given to you by your health care provider. Make sure you discuss any questions you have with your health care provider. °  °Document Released: 02/22/2004 Document Revised: 04/30/2013 Document Reviewed: 03/17/2013 °Elsevier Interactive Patient Education ©2016 [employer] Inc[employer] ° °

## 2015-06-23 ENCOUNTER — Telehealth: Payer: Self-pay | Admitting: Family Medicine

## 2015-06-23 LAB — HEPATITIS C ANTIBODY: HCV Ab: NEGATIVE

## 2015-06-23 LAB — HIV ANTIBODY (ROUTINE TESTING W REFLEX): HIV 1&2 Ab, 4th Generation: NONREACTIVE

## 2015-06-23 NOTE — Telephone Encounter (Signed)
Message left for her to call back about test result.    Result: Neg HIV and Hep C test.

## 2015-07-06 ENCOUNTER — Telehealth: Payer: Self-pay | Admitting: Family Medicine

## 2015-07-06 NOTE — Telephone Encounter (Signed)
Pt received letter from [place].  Imitrex is only provided in generic form. Please reorder this in the generic name. Clobetasol--please order this,  E scripts thru [place]

## 2015-07-22 ENCOUNTER — Encounter: Payer: Self-pay | Admitting: Family Medicine

## 2015-07-22 ENCOUNTER — Ambulatory Visit
Admission: RE | Admit: 2015-07-22 | Discharge: 2015-07-22 | Disposition: A | Discharge status: Home / Self Care | Payer: Medicare Other | Source: Ambulatory Visit

## 2015-07-22 DIAGNOSIS — Z1231 Encounter for screening mammogram for malignant neoplasm of breast: Secondary | ICD-10-CM | POA: Diagnosis not present

## 2015-08-01 ENCOUNTER — Emergency Department (INDEPENDENT_AMBULATORY_CARE_PROVIDER_SITE_OTHER)
Admission: EM | Admit: 2015-08-01 | Discharge: 2015-08-01 | Disposition: A | Discharge status: Home / Self Care | Payer: Medicare Other | Source: Home / Self Care | Attending: Emergency Medicine | Admitting: Emergency Medicine

## 2015-08-01 ENCOUNTER — Encounter (HOSPITAL_COMMUNITY): Payer: Self-pay | Admitting: Emergency Medicine

## 2015-08-01 DIAGNOSIS — J40 Bronchitis, not specified as acute or chronic: Secondary | ICD-10-CM | POA: Diagnosis not present

## 2015-08-01 MED ORDER — AMOXICILLIN 500 MG PO CAPS: 500.0000 mg | ORAL_CAPSULE | Freq: Three times a day (TID) | ORAL | Status: DC

## 2015-08-01 MED ORDER — ALBUTEROL SULFATE HFA 108 (90 BASE) MCG/ACT IN AERS: 1.0000 | INHALATION_SPRAY | Freq: Four times a day (QID) | RESPIRATORY_TRACT | Status: DC | PRN

## 2015-08-01 NOTE — Discharge Instructions (Signed)

## 2015-08-01 NOTE — ED Notes (Signed)
C/o cold sx onset Tuesday associated w/prod cough, wheezing and congestion A&O x4... No acute distress.

## 2015-08-01 NOTE — ED Provider Notes (Signed)
CSN: [id number]     Arrival date & time 08/01/15  1258 History   First MD Initiated Contact with Patient 08/01/15 1324     Chief Complaint  Patient presents with  . URI   (Consider location/radiation/quality/duration/timing/severity/associated sxs/prior Treatment) HPI History obtained from patient:   LOCATION: Upper respiratory SEVERITY: No pain DURATION: Almost 2 weeks CONTEXT: Exposed to grandchildren that were sick QUALITY: Scratchy throat, dry cough MODIFYING FACTORS: Over-the-counter medications without relief of symptoms ASSOCIATED SYMPTOMS: Low-grade temperature at home TIMING: Now constant OCCUPATION: Retired Marine scientist  Past Medical History  Diagnosis Date  . Hypertension   . Diverticulosis   . Herpes zoster   . Migraine   . Chronic back pain   . Hyperlipidemia    History reviewed. No pertinent past surgical history. No family history on file. Social History  Substance Use Topics  . Smoking status: Former Smoker    Quit date: 02/07/1987  . Smokeless tobacco: None  . Alcohol Use: No   OB History    No data available     Review of Systems ROS +'ve uri symptoms.  Denies: HEADACHE, NAUSEA, ABDOMINAL PAIN, CHEST PAIN, CONGESTION, DYSURIA, SHORTNESS OF BREATH  Allergies  Review of patient's allergies indicates no known allergies.  Home Medications   Prior to Admission medications   Medication Sig Start Date End Date Taking? Authorizing Provider  esomeprazole (NEXIUM) 40 MG capsule Take 1 capsule (40 mg total) by mouth daily at 12 noon. 06/22/15  Yes [name], MD  gabapentin (NEURONTIN) 300 MG capsule Take 2 capsules (600 mg total) by mouth at bedtime. take two tablets before bed for hot flashes 06/22/15  Yes [name], MD  hydrochlorothiazide (HYDRODIURIL) 25 MG tablet Take 1 tablet (25 mg total) by mouth daily. 06/22/15  Yes [name], MD  pravastatin (PRAVACHOL) 20 MG tablet Take 1 tablet (20 mg total) by mouth daily. 06/22/15  Yes  [name], MD  valACYclovir (VALTREX) 500 MG tablet Take 1 tablet (500 mg total) by mouth daily. 06/22/15  Yes [name], MD  albuterol (PROVENTIL HFA;VENTOLIN HFA) 108 (90 Base) MCG/ACT inhaler Inhale 1-2 puffs into the lungs every 6 (six) hours as needed for wheezing or shortness of breath. 08/01/15   Konrad Felix, PA  amoxicillin (AMOXIL) 500 MG capsule Take 1 capsule (500 mg total) by mouth 3 (three) times daily. 08/01/15   Konrad Felix, PA  Elastic Bandages & Supports (FUTURO FIRM COMPRESSION HOSE) [place], medium compression     Historical Provider, MD  eszopiclone (LUNESTA) 2 MG TABS tablet Take 1 tablet (2 mg total) by mouth at bedtime as needed for sleep. Take immediately before bedtime 06/22/15   [name], MD  SUMAtriptan (IMITREX) 25 MG tablet take one tablet at start of headache.  May repeat in 2 hours x 1 if needed. 06/22/15   [name], MD   Meds Ordered and Administered this Visit  Medications - No data to display  BP 133/82 mmHg  Pulse 85  Temp(Src) 98.1 F (36.7 C) (Oral)  Resp 18  SpO2 96% No data found.   Physical Exam  Constitutional: She is oriented to person, place, and time. She appears well-developed and well-nourished. No distress.  HENT:  Head: Normocephalic and atraumatic.  Right Ear: External ear normal.  Left Ear: External ear normal.  Mouth/Throat: Oropharynx is clear and moist.  Eyes: Conjunctivae are normal.  Neck: Normal range of motion. Neck supple.  Pulmonary/Chest: Effort  normal. She has wheezes.  There are diffuse wheezes throughout the lung fields without acute symptoms consolidation. There are no crackles.  Abdominal: Soft. There is tenderness.  Abdominal wall muscles. Mildly tender to palpation and there are 2 harsh coughing. Otherwise the abdominal exam is unremarkable.  Musculoskeletal: Normal range of motion.  Neurological: She is alert and oriented to person, place, and time.  Skin: Skin is warm and  dry.  Psychiatric: She has a normal mood and affect. Her behavior is normal. Judgment and thought content normal.    ED Course  Procedures (including critical care time)  Labs Review Labs Reviewed - No data to display  Imaging Review No results found.   Visual Acuity Review  Right Eye Distance:   Left Eye Distance:   Bilateral Distance:    Right Eye Near:   Left Eye Near:    Bilateral Near:         MDM   1. Bronchitis    Patient is advised to continue home symptomatic treatment. Prescription for amoxicillin and albuterol are sent to pharmacy patient has indicated. Patient is advised that if there are new or worsening symptoms or attend the emergency department, or contact primary care provider. Instructions of care provided discharged home in stable condition.  THIS NOTE WAS GENERATED USING A VOICE RECOGNITION SOFTWARE PROGRAM. ALL REASONABLE EFFORTS  WERE MADE TO PROOFREAD THIS DOCUMENT FOR ACCURACY.     Konrad Felix, PA 08/01/15 1523

## 2015-08-06 ENCOUNTER — Ambulatory Visit (INDEPENDENT_AMBULATORY_CARE_PROVIDER_SITE_OTHER): Payer: Medicare Other | Admitting: Obstetrics and Gynecology

## 2015-08-06 ENCOUNTER — Ambulatory Visit (HOSPITAL_COMMUNITY)
Admission: RE | Admit: 2015-08-06 | Discharge: 2015-08-06 | Disposition: A | Discharge status: Home / Self Care | Payer: Medicare Other | Source: Ambulatory Visit | Attending: Family Medicine | Admitting: Family Medicine

## 2015-08-06 VITALS — BP 129/68 | HR 82 | Temp 98.6°F | Ht 63.0 in | Wt 221.3 lb

## 2015-08-06 DIAGNOSIS — R918 Other nonspecific abnormal finding of lung field: Secondary | ICD-10-CM | POA: Insufficient documentation

## 2015-08-06 DIAGNOSIS — R05 Cough: Secondary | ICD-10-CM | POA: Insufficient documentation

## 2015-08-06 DIAGNOSIS — R0989 Other specified symptoms and signs involving the circulatory and respiratory systems: Secondary | ICD-10-CM | POA: Diagnosis not present

## 2015-08-06 DIAGNOSIS — I10 Essential (primary) hypertension: Secondary | ICD-10-CM | POA: Insufficient documentation

## 2015-08-06 DIAGNOSIS — R059 Cough, unspecified: Secondary | ICD-10-CM

## 2015-08-06 MED ORDER — GUAIFENESIN-CODEINE 200-10 MG/5ML PO LIQD: 10.0000 mL | ORAL | Status: DC | PRN

## 2015-08-06 NOTE — Progress Notes (Signed)
   Subjective:   Patient ID: Angie [name], female    DOB: May 24, 1946, 70 y.o.   MRN: [id number]  Patient presents for Same Day Appointment  Chief Complaint  Patient presents with  . Cough    x two weeks on antibiotics from vist to urgent care fro bronchitis    HPI: #COUGH -cough present for the last 2 weeks -she went to the ED on 1/8 and was diagnosed with bronchitis -received antibiotics (amoxicillin) in ED; done with Abx on Monday  -states that other symptoms such as congestion have gotten better but still with persistent dry cough -cough so much she gets headache and chest pain, also has incontinence (not new problem) -worse at night and she can't sleep Sputum production: no Medications tried: lozenges, mucinex, Nyquil, lemon/honey -of note, husband had CAP over Christmas No h.o COPD  Symptoms Runny nose:  no Mucous in back of throat: yes Throat burning or reflux:  no Wheezing or asthma: no Fever: no Chest Pain: yes, from coughing Shortness of breath: no Leg swelling: no Hemoptysis: no  Review of Systems   See HPI for ROS.  Smoking Status noted  Pertinent past medical history, surgical, family, and social history reviewed and updated in the EMR as appropriate.  Objective:  BP 129/68 mmHg  Pulse 82  Temp(Src) 98.6 F (37 C) (Oral)  Ht 5\' 3"  (1.6 m)  Wt 221 lb 4.8 oz (100.381 kg)  BMI 39.21 kg/m2 Vitals and nursing note reviewed  Physical Exam  Constitutional: She is well-developed, well-nourished, and in no distress.  HENT:  Mouth/Throat: Oropharynx is clear and moist.  Cardiovascular: Normal rate and regular rhythm.   Pulmonary/Chest: Effort normal. She has decreased breath sounds. She has no wheezes. She has rhonchi. She exhibits no tenderness.  Musculoskeletal: She exhibits no edema.  Lymphadenopathy:    She has no cervical adenopathy.  Skin: Skin is warm and dry.    Assessment & Plan:  1. Cough. Patient with dry cough for about 2 weeks.  Cough is now becoming bothersome for patient with headaches, chest pain, and incontinence. Recent diagnosis of bronchitis. She has been on antibiotics for about 5 days now. Rhonchi appreciated diffusely in lungs and decreased air movement at bases. Patient is well-appearing and nontoxic. Vitals are stable and afebrile. Most likely a post-infectious cough that will continue to improve. Will order a CXR since she did not receive one in ED. Rx given for cough suppressant.  Return precautions discussed. Patient to follow-up prn with PCP if not improved.    Meds ordered this encounter  Medications  . Guaifenesin-Codeine 200-10 MG/5ML LIQD    Sig: Take 10 mLs by mouth every 4 (four) hours as needed (cough).    Dispense:  473 mL    Refill:  Palmer, DO 08/06/2015, 2:37 PM PGY-2, [place]

## 2015-08-06 NOTE — Patient Instructions (Signed)
Please go and get your chest x-ray; will let you know results I am giving you a potent cough medicine please use as instructed.  If cough not better in 1 week RTC. Seek help if you have chest pain, shortness of breath, dizziness, fevers.   Acute Bronchitis Bronchitis is when the airways that extend from the windpipe into the lungs get red, puffy, and painful (inflamed). Bronchitis often causes thick spit (mucus) to develop. This leads to a cough. A cough is the most common symptom of bronchitis. In acute bronchitis, the condition usually begins suddenly and goes away over time (usually in 2 weeks). Smoking, allergies, and asthma can make bronchitis worse. Repeated episodes of bronchitis may cause more lung problems. HOME CARE  Rest.  Drink enough fluids to keep your pee (urine) clear or pale yellow (unless you need to limit fluids as told by your doctor).  Only take over-the-counter or prescription medicines as told by your doctor.  Avoid smoking and secondhand smoke. These can make bronchitis worse. If you are a smoker, think about using nicotine gum or skin patches. Quitting smoking will help your lungs heal faster.  Reduce the chance of getting bronchitis again by:  Washing your hands often.  Avoiding people with cold symptoms.  Trying not to touch your hands to your mouth, nose, or eyes.  Follow up with your doctor as told. GET HELP IF: Your symptoms do not improve after 1 week of treatment. Symptoms include:  Cough.  Fever.  Coughing up thick spit.  Body aches.  Chest congestion.  Chills.  Shortness of breath.  Sore throat. GET HELP RIGHT AWAY IF:   You have an increased fever.  You have chills.  You have severe shortness of breath.  You have bloody thick spit (sputum).  You throw up (vomit) often.  You lose too much body fluid (dehydration).  You have a severe headache.  You faint. MAKE SURE YOU:   Understand these instructions.  Will watch your  condition.  Will get help right away if you are not doing well or get worse.   This information is not intended to replace advice given to you by your health care provider. Make sure you discuss any questions you have with your health care provider.   Document Released: 12/27/2007 Document Revised: 03/12/2013 Document Reviewed: 12/31/2012 Elsevier Interactive Patient Education [employer].

## 2015-08-10 ENCOUNTER — Encounter: Payer: Self-pay | Admitting: Obstetrics and Gynecology

## 2015-08-10 ENCOUNTER — Telehealth: Payer: Self-pay | Admitting: Family Medicine

## 2015-08-10 ENCOUNTER — Other Ambulatory Visit: Payer: Self-pay | Admitting: Family Medicine

## 2015-08-10 MED ORDER — SUMATRIPTAN SUCCINATE 25 MG PO TABS: ORAL_TABLET | ORAL | Status: DC

## 2015-08-10 MED ORDER — CLOBETASOL PROPIONATE 0.05 % EX CREA: 1.0000 "application " | TOPICAL_CREAM | Freq: Two times a day (BID) | CUTANEOUS | Status: DC

## 2015-08-10 NOTE — Telephone Encounter (Signed)
Refill completed.

## 2015-08-10 NOTE — Telephone Encounter (Signed)
Pt received letter from [place]. Imitrex is only provided in generic form. Please reorder this in the generic name. Clobetasol--please order this,  E scripts thru [place]

## 2015-08-10 NOTE — Telephone Encounter (Signed)
Will forward message to PCP to reorder Imitrex in generic form. Pt is requesting PCP order Clobetasol - e script through [place].

## 2015-09-15 DIAGNOSIS — M7541 Impingement syndrome of right shoulder: Secondary | ICD-10-CM | POA: Diagnosis not present

## 2015-11-19 ENCOUNTER — Other Ambulatory Visit: Payer: Self-pay | Admitting: Family Medicine

## 2015-11-19 MED ORDER — VALACYCLOVIR HCL 500 MG PO TABS: 500.0000 mg | ORAL_TABLET | Freq: Every day | ORAL | Status: DC

## 2015-11-19 MED ORDER — ESOMEPRAZOLE MAGNESIUM 40 MG PO CPDR: 40.0000 mg | DELAYED_RELEASE_CAPSULE | Freq: Every day | ORAL | Status: DC | PRN

## 2015-11-19 NOTE — Telephone Encounter (Signed)
Refill request for Nexium and Valtrex. Pt request hard copies of these RXs. Please call pt for pickup.

## 2016-02-08 ENCOUNTER — Encounter: Payer: Self-pay | Admitting: Family Medicine

## 2016-02-08 ENCOUNTER — Telehealth: Payer: Self-pay | Admitting: *Deleted

## 2016-02-08 ENCOUNTER — Ambulatory Visit (INDEPENDENT_AMBULATORY_CARE_PROVIDER_SITE_OTHER): Payer: Medicare Other | Admitting: Family Medicine

## 2016-02-08 VITALS — BP 146/71 | HR 71 | Temp 98.4°F | Wt 217.0 lb

## 2016-02-08 DIAGNOSIS — R7309 Other abnormal glucose: Secondary | ICD-10-CM

## 2016-02-08 DIAGNOSIS — I10 Essential (primary) hypertension: Secondary | ICD-10-CM

## 2016-02-08 DIAGNOSIS — E2839 Other primary ovarian failure: Secondary | ICD-10-CM | POA: Diagnosis not present

## 2016-02-08 DIAGNOSIS — Z1211 Encounter for screening for malignant neoplasm of colon: Secondary | ICD-10-CM

## 2016-02-08 DIAGNOSIS — Z760 Encounter for issue of repeat prescription: Secondary | ICD-10-CM | POA: Insufficient documentation

## 2016-02-08 DIAGNOSIS — Z23 Encounter for immunization: Secondary | ICD-10-CM

## 2016-02-08 DIAGNOSIS — R202 Paresthesia of skin: Secondary | ICD-10-CM | POA: Diagnosis not present

## 2016-02-08 HISTORY — DX: Paresthesia of skin: R20.2

## 2016-02-08 LAB — VITAMIN B12: Vitamin B-12: 940 pg/mL (ref 200–1100)

## 2016-02-08 LAB — TSH: TSH: 1.77 mIU/L

## 2016-02-08 LAB — POCT GLYCOSYLATED HEMOGLOBIN (HGB A1C): Hemoglobin A1C: 6

## 2016-02-08 MED ORDER — PRAVASTATIN SODIUM 20 MG PO TABS: 20.0000 mg | ORAL_TABLET | Freq: Every day | ORAL | Status: DC

## 2016-02-08 MED ORDER — VALACYCLOVIR HCL 500 MG PO TABS: 500.0000 mg | ORAL_TABLET | Freq: Every day | ORAL | Status: DC

## 2016-02-08 MED ORDER — CLOBETASOL PROPIONATE 0.05 % EX CREA: 1.0000 "application " | TOPICAL_CREAM | Freq: Two times a day (BID) | CUTANEOUS | Status: DC

## 2016-02-08 MED ORDER — ESOMEPRAZOLE MAGNESIUM 40 MG PO CPDR: 40.0000 mg | DELAYED_RELEASE_CAPSULE | Freq: Every day | ORAL | Status: DC | PRN

## 2016-02-08 MED ORDER — PNEUMOCOCCAL VAC POLYVALENT 25 MCG/0.5ML IJ INJ
0.5000 mL | INJECTION | INTRAMUSCULAR | Status: AC
  Administered 2016-02-08: 0.5 mL via INTRAMUSCULAR

## 2016-02-08 MED ORDER — SUMATRIPTAN SUCCINATE 25 MG PO TABS: ORAL_TABLET | ORAL | Status: DC

## 2016-02-08 MED ORDER — HYDROCHLOROTHIAZIDE 25 MG PO TABS: 25.0000 mg | ORAL_TABLET | Freq: Every day | ORAL | Status: DC

## 2016-02-08 MED ORDER — ALBUTEROL SULFATE HFA 108 (90 BASE) MCG/ACT IN AERS: 1.0000 | INHALATION_SPRAY | Freq: Four times a day (QID) | RESPIRATORY_TRACT | Status: DC | PRN

## 2016-02-08 MED ORDER — GABAPENTIN 300 MG PO CAPS: 600.0000 mg | ORAL_CAPSULE | Freq: Every day | ORAL | Status: DC

## 2016-02-08 MED ORDER — ESZOPICLONE 2 MG PO TABS: 2.0000 mg | ORAL_TABLET | Freq: Every evening | ORAL | Status: DC | PRN

## 2016-02-08 NOTE — Telephone Encounter (Signed)
Discussed with patient. Thanks!

## 2016-02-08 NOTE — Telephone Encounter (Signed)
Spoke with patient regarding her GI referral to see who she saw prior.  Patient had her last colonoscopy with Dr. Lorie Apley office.  Will send office note and labs to them once office note is complete.  Patient was also trying to return Dr. Macario Golds call regarding her A1C and what she needs to do.  She can be reached on her home number. Jazmin Hartsell,CMA

## 2016-02-08 NOTE — Progress Notes (Signed)
Subjective:     Patient ID: Angie [name], female   DOB: October 11, 1945, 70 y.o.   MRN: [id number]  Toe Pain  Incident onset: C/O burning pain of her right middle toe for more than 1 month on and off. The incident occurred at home. There was no injury mechanism. Pain location: right middle toe. The quality of the pain is described as burning. The pain is at a severity of 1/10. The pain is mild. The pain has been intermittent (Currently symptomatic) since onset. Pertinent negatives include no inability to bear weight, loss of motion, loss of sensation, muscle weakness or numbness. Nothing aggravates the symptoms. She has tried nothing for the symptoms. The treatment provided no relief.  She is worried she might have diabetes given hx in her parents her two sisters and her niece. HTN: Doing well, need med refill. HM: Here for follow up and medication refill.   Current Outpatient Prescriptions on File Prior to Visit  Medication Sig Dispense Refill  . Elastic Bandages & Supports (FUTURO FIRM COMPRESSION HOSE) MISC Ted Hose, medium compression      No current facility-administered medications on file prior to visit.   Past Medical History  Diagnosis Date  . Hypertension   . Diverticulosis   . Herpes zoster   . Migraine   . Chronic back pain   . Hyperlipidemia   . Palpitations 09/08/2014  . 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  No signs of active disease and low risk for reactivation.  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  Patient declines referral to health department to discuss at this time.       Review of Systems  Respiratory: Negative.   Cardiovascular: Negative.   Gastrointestinal: Negative.   Musculoskeletal:       Burning toe pain  Neurological: Negative for tremors and numbness.  All other systems reviewed and are negative.  Filed Vitals:   02/08/16 0924  BP: 146/71  Pulse: 71  Temp: 98.4 F (36.9 C)  TempSrc: Oral   Weight: 217 lb (98.431 kg)  SpO2: 95%       Objective:   Physical Exam  Constitutional: She is oriented to person, place, and time. She appears well-developed. No distress.  Cardiovascular: Normal rate, regular rhythm and normal heart sounds.   No murmur heard. Pulmonary/Chest: Effort normal and breath sounds normal. No respiratory distress. She has no wheezes.  Abdominal: Soft. Bowel sounds are normal. She exhibits no distension and no mass. There is no tenderness. There is no rebound.  Musculoskeletal: Normal range of motion. She exhibits no edema.  Neurological: She is alert and oriented to person, place, and time. She has normal strength and normal reflexes. No cranial nerve deficit or sensory deficit.  No sensory loss over her right foot and toe.  Nursing note and vitals reviewed.      Assessment:     Paresthesia HTN Health maintenance     Plan:     Check problem list.  For Health maintenance, referral to GI done again. Dexa scan recommended and ordered. She will call for an appointment.

## 2016-02-08 NOTE — Patient Instructions (Signed)
It was nice seeing you today. We have done some blood test to check for diabetes and vitamin deficiency as a potential cause of your symptoms. I will call you with result. Please schedule colonoscopy and bone density appointment. See you back soon

## 2016-02-08 NOTE — Assessment & Plan Note (Signed)
Right middle toe. Concern for DM given high family hx. A1C today of 6.0 i.e prediabetes. Patient called and result discussed with her. Diet and exercise counseling done. Gabapentin refill. Wear soft and comfortable shoe. F/U as needed.

## 2016-02-08 NOTE — Assessment & Plan Note (Signed)
All meds reviewed and refilled today.

## 2016-02-08 NOTE — Assessment & Plan Note (Signed)
BP looks good. Medication refilled.

## 2016-02-28 ENCOUNTER — Other Ambulatory Visit: Payer: Self-pay | Admitting: Family Medicine

## 2016-02-28 DIAGNOSIS — E669 Obesity, unspecified: Secondary | ICD-10-CM

## 2016-02-28 DIAGNOSIS — R7303 Prediabetes: Secondary | ICD-10-CM

## 2016-03-02 ENCOUNTER — Ambulatory Visit
Admission: RE | Admit: 2016-03-02 | Discharge: 2016-03-02 | Disposition: A | Discharge status: Home / Self Care | Payer: Medicare Other | Source: Ambulatory Visit | Attending: Family Medicine | Admitting: Family Medicine

## 2016-03-02 ENCOUNTER — Telehealth: Payer: Self-pay | Admitting: Family Medicine

## 2016-03-02 DIAGNOSIS — Z13820 Encounter for screening for osteoporosis: Secondary | ICD-10-CM | POA: Diagnosis not present

## 2016-03-02 DIAGNOSIS — E2839 Other primary ovarian failure: Secondary | ICD-10-CM

## 2016-03-02 NOTE — Telephone Encounter (Signed)
Dexa scan result discussed with her. It was normal. I however still recommended O[place] D once daily. She will get it from the pharmacy.

## 2016-03-06 DIAGNOSIS — B308 Other viral conjunctivitis: Secondary | ICD-10-CM | POA: Diagnosis not present

## 2016-05-04 ENCOUNTER — Encounter: Payer: Self-pay | Admitting: Family Medicine

## 2016-05-04 ENCOUNTER — Ambulatory Visit (INDEPENDENT_AMBULATORY_CARE_PROVIDER_SITE_OTHER): Payer: Medicare Other | Admitting: Family Medicine

## 2016-05-04 DIAGNOSIS — E669 Obesity, unspecified: Secondary | ICD-10-CM | POA: Diagnosis not present

## 2016-05-04 DIAGNOSIS — R7309 Other abnormal glucose: Secondary | ICD-10-CM | POA: Diagnosis not present

## 2016-05-04 NOTE — Progress Notes (Signed)
Medical Nutrition Therapy:  Appt start time: 1430 end time:  [id number].  Assessment:  Primary concerns today: Weight management and Blood sugar control.   Learning Readiness: Ready  Usual eating pattern includes 2-3 meals and 1-2 snacks per day. Frequent foods and beverages include water, 1 c coffee w/ ~1/4 c 2% milk, 1/2 tsp honey, stevia, chx, potatoes, rice, bread, broc, grn beans, spinach, fruit (1/2-full portion), nuts, yogurt.  Avoided foods include onions, chocolate, and tomato sauces, spicy foods (GERD), butter, fried (cholecystectomy).   Usual physical activity includes MTW 1 hr ex class (Silver Sneakers or step aerobics), sporadic on other days.  Angie [name] has started a program at her church for diabetes program, which is taught by a retired from [place] and an Therapist, sports from [employer].  She doesn't know the name of it, but it sounds like the Diabetes Prevention Program, which is a year long.  She almost cancelled today's appt b/c of this.  I suggested she schedule in about 8 weeks to discuss evening snacking, which she identifies as a particular challenge to her in controlling carb intake.  She is receptive to this.    24-hr recall: (Up at 9:15 AM) B (10 AM)-   1 c coffee w/ ~1/4 c 2% milk, 1/2 tsp honey, stevia, 1 Nutrigrain waffle, 1 T pb, 1/2 banana Snk ( AM)-    L (1 PM)-  1 apple, water Snk (4 PM)-  <1/4 c walnuts, 6 oz Grk yogurt, water D (7:30 PM)-  [facility] with some added chx and veg's Snk (9 PM)-  2 c in-the-shell unsalted peanuts, unswt tea Typical day? Yes.  although snacks are often more carb-based.    Progress Towards Goal(s):  In progress.   Nutritional Diagnosis:  NI-5.8.2 Excessive carbohydrate intake As related to meal proportions and snacks.  As evidenced by self-report of being especially drawn to carb foods (and usual substitution of snack for lunch).    Intervention:  Education. Handouts given during visit  include:  AVS  Demonstrated degree of understanding via:  Teach Back  Barriers to learning/adherence to lifestyle change: Her husband Angie [name] eats a lot of sweets, which he brings into the house.    Monitoring/Evaluation:  Dietary intake, exercise, and body weight in 8 week(s).

## 2016-05-04 NOTE — Patient Instructions (Addendum)
  Diet Recommendations for Diabetes   Starchy (carb) foods: Bread, rice, pasta, potatoes, corn, cereal, grits, crackers, bagels, muffins, all baked goods.  (Fruits, milk, and yogurt also have carbohydrate, but most of these foods will not spike your blood sugar as the starchy foods will.)  A few fruits do cause high blood sugars; use small portions of bananas (limit to 1/2 at a time), grapes, watermelon, oranges, and most tropical fruits.    Protein foods: Meat, fish, poultry, eggs, dairy foods, and beans such as pinto and kidney beans (beans also provide carbohydrate).   1. Eat at least 3 REAL meals and 1-2 snacks per day. Never go more than 4-5 hours while awake without eating. Eat breakfast within the first hour of getting up.    A REAL meal includes a source of protein, some starch, and some vegetables and/or fruit. 2. Limit starchy foods to TWO per meal and ONE per snack. ONE portion of a starchy  food is equal to the following:   - ONE slice of bread (or its equivalent, such as half of a hamburger bun).   - 1/2 cup of a "scoopable" starchy food such as potatoes or rice.   - 15 grams of TOTAL carbohydrate as shown on food label.  3. Include at every meal: a protein food, a carb food, and vegetables and/or fruit.   - Obtain twice the volume of veg's as protein or carbohydrate foods for both lunch and dinner.   - Fresh or frozen veg's are best.   - Keep frozen veg's on hand for a quick vegetable serving.      - When you come back, we can talk about snacking and non-hunger eating (evening snacking).  - Exercise:  Keep in mind that the more days you exercise, the better blood sugar control you will have.  Also, the best exercise effect on your blood sugar is if you walk right after a meal.   - TASTE PREFERENCES ARE LEARNED.  This means that it will get easier to choose foods you know are good for you if you are exposed to them enough.   - Email [email or web address] to remind me to send  the recipe for Zucchini corn muffins.

## 2016-05-10 DIAGNOSIS — I87323 Chronic venous hypertension (idiopathic) with inflammation of bilateral lower extremity: Secondary | ICD-10-CM | POA: Diagnosis not present

## 2016-05-25 DIAGNOSIS — M1711 Unilateral primary osteoarthritis, right knee: Secondary | ICD-10-CM | POA: Diagnosis not present

## 2016-06-13 DIAGNOSIS — K5730 Diverticulosis of large intestine without perforation or abscess without bleeding: Secondary | ICD-10-CM | POA: Diagnosis not present

## 2016-06-13 DIAGNOSIS — Z1211 Encounter for screening for malignant neoplasm of colon: Secondary | ICD-10-CM | POA: Diagnosis not present

## 2016-06-13 DIAGNOSIS — K219 Gastro-esophageal reflux disease without esophagitis: Secondary | ICD-10-CM | POA: Diagnosis not present

## 2016-06-27 ENCOUNTER — Other Ambulatory Visit: Payer: Self-pay | Admitting: Family Medicine

## 2016-06-27 DIAGNOSIS — Z1231 Encounter for screening mammogram for malignant neoplasm of breast: Secondary | ICD-10-CM

## 2016-06-29 ENCOUNTER — Ambulatory Visit: Payer: Medicare Other | Admitting: Family Medicine

## 2016-07-12 DIAGNOSIS — K635 Polyp of colon: Secondary | ICD-10-CM | POA: Diagnosis not present

## 2016-07-12 DIAGNOSIS — Z1211 Encounter for screening for malignant neoplasm of colon: Secondary | ICD-10-CM | POA: Diagnosis not present

## 2016-07-12 DIAGNOSIS — D124 Benign neoplasm of descending colon: Secondary | ICD-10-CM | POA: Diagnosis not present

## 2016-07-12 DIAGNOSIS — K5730 Diverticulosis of large intestine without perforation or abscess without bleeding: Secondary | ICD-10-CM | POA: Diagnosis not present

## 2016-08-03 DIAGNOSIS — H04123 Dry eye syndrome of bilateral lacrimal glands: Secondary | ICD-10-CM | POA: Diagnosis not present

## 2016-08-03 DIAGNOSIS — H2513 Age-related nuclear cataract, bilateral: Secondary | ICD-10-CM | POA: Diagnosis not present

## 2016-08-07 ENCOUNTER — Ambulatory Visit
Admission: RE | Admit: 2016-08-07 | Discharge: 2016-08-07 | Disposition: A | Discharge status: Home / Self Care | Payer: Medicare Other | Source: Ambulatory Visit | Attending: Family Medicine | Admitting: Family Medicine

## 2016-08-07 DIAGNOSIS — Z1231 Encounter for screening mammogram for malignant neoplasm of breast: Secondary | ICD-10-CM

## 2016-08-08 ENCOUNTER — Telehealth: Payer: Self-pay | Admitting: Family Medicine

## 2016-08-08 NOTE — Telephone Encounter (Signed)
I called and confirmed with patient. She had colonoscopy done Dec 20,2017. 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 Per patient, Dr. Overton Mam recommended repeat colonoscopy in 5 yrs. I will update this on file.

## 2016-10-13 ENCOUNTER — Encounter: Payer: Self-pay | Admitting: Family Medicine

## 2016-10-13 ENCOUNTER — Ambulatory Visit (INDEPENDENT_AMBULATORY_CARE_PROVIDER_SITE_OTHER): Payer: Medicare Other | Admitting: Family Medicine

## 2016-10-13 VITALS — BP 120/64 | HR 73 | Temp 98.3°F | Ht 63.0 in | Wt 202.0 lb

## 2016-10-13 DIAGNOSIS — R7309 Other abnormal glucose: Secondary | ICD-10-CM | POA: Diagnosis not present

## 2016-10-13 DIAGNOSIS — E785 Hyperlipidemia, unspecified: Secondary | ICD-10-CM | POA: Diagnosis not present

## 2016-10-13 DIAGNOSIS — I10 Essential (primary) hypertension: Secondary | ICD-10-CM

## 2016-10-13 DIAGNOSIS — R7303 Prediabetes: Secondary | ICD-10-CM | POA: Diagnosis not present

## 2016-10-13 DIAGNOSIS — E669 Obesity, unspecified: Secondary | ICD-10-CM

## 2016-10-13 LAB — POCT GLYCOSYLATED HEMOGLOBIN (HGB A1C): Hemoglobin A1C: 5.9

## 2016-10-13 MED ORDER — SUMATRIPTAN SUCCINATE 25 MG PO TABS: ORAL_TABLET | ORAL | 2 refills | Status: DC

## 2016-10-13 MED ORDER — HYDROCHLOROTHIAZIDE 25 MG PO TABS: 25.0000 mg | ORAL_TABLET | Freq: Every day | ORAL | 2 refills | Status: DC

## 2016-10-13 MED ORDER — VALACYCLOVIR HCL 500 MG PO TABS: 500.0000 mg | ORAL_TABLET | Freq: Every day | ORAL | 2 refills | Status: DC

## 2016-10-13 MED ORDER — GABAPENTIN 300 MG PO CAPS: 600.0000 mg | ORAL_CAPSULE | Freq: Every evening | ORAL | 2 refills | Status: DC | PRN

## 2016-10-13 MED ORDER — ALBUTEROL SULFATE HFA 108 (90 BASE) MCG/ACT IN AERS: 1.0000 | INHALATION_SPRAY | Freq: Four times a day (QID) | RESPIRATORY_TRACT | 6 refills | Status: DC | PRN

## 2016-10-13 NOTE — Assessment & Plan Note (Addendum)
Stable on HCTZ. Bmet checked today. FLP checked for her HLD today. Med refilled.

## 2016-10-13 NOTE — Assessment & Plan Note (Signed)
She self D/C Pravachol due to side effects. She will continue to work on her diet and exercise instead. FLP checked today.

## 2016-10-13 NOTE — Patient Instructions (Signed)
  It was nice seeing you today. Your A1C improved a bit to 5.9, however it still indicates that you have Prediabetes. Please continue to work on your diet and exercise. I am impressed by how much weight you have lost already. I will see you in 6 months.   Prediabetes Eating Plan Prediabetes-also called impaired glucose tolerance or impaired fasting glucose-is a condition that causes blood sugar (blood glucose) levels to be higher than normal. Following a healthy diet can help to keep prediabetes under control. It can also help to lower the risk of type 2 diabetes and heart disease, which are increased in people who have prediabetes. Along with regular exercise, a healthy diet:  Promotes weight loss.  Helps to control blood sugar levels.  Helps to improve the way that the body uses insulin. What do I need to know about this eating plan?  Use the glycemic index (GI) to plan your meals. The index tells you how quickly a food will raise your blood sugar. Choose low-GI foods. These foods take a longer time to raise blood sugar.  Pay close attention to the amount of carbohydrates in the food that you eat. Carbohydrates increase blood sugar levels.  Keep track of how many calories you take in. Eating the right amount of calories will help you to achieve a healthy weight. Losing about 7 percent of your starting weight can help to prevent type 2 diabetes.  You may want to follow a Mediterranean diet. This diet includes a lot of vegetables, lean meats or fish, whole grains, fruits, and healthy oils and fats. What foods can I eat? Grains  Whole grains, such as whole-wheat or whole-grain breads, crackers, cereals, and pasta. Unsweetened oatmeal. Bulgur. Barley. Quinoa. Brown rice. Corn or whole-wheat flour tortillas or taco shells. Vegetables  Lettuce. Spinach. Peas. Beets. Cauliflower. Cabbage. Broccoli. Carrots. Tomatoes. Squash. Eggplant. Herbs. Peppers. Onions. Cucumbers. Brussels sprouts. Fruits   Berries. Bananas. Apples. Oranges. Grapes. Papaya. Mango. Pomegranate. Kiwi. Grapefruit. Cherries. Meats and Other Protein Sources  Seafood. Lean meats, such as chicken and [place] or lean cuts of pork and beef. Tofu. Eggs. Nuts. Beans. Dairy  Low-fat or fat-free dairy products, such as yogurt, cottage cheese, and cheese. Beverages  Water. Tea. Coffee. Sugar-free or diet soda. Seltzer water. Milk. Milk alternatives, such as soy or almond milk. Condiments  Mustard. Relish. Low-fat, low-sugar ketchup. Low-fat, low-sugar barbecue sauce. Low-fat or fat-free mayonnaise. Sweets and Desserts  Sugar-free or low-fat pudding. Sugar-free or low-fat ice cream and other frozen treats. Fats and Oils  Avocado. Walnuts. Olive oil. The items listed above may not be a complete list of recommended foods or beverages. Contact your dietitian for more options.  What foods are not recommended? Grains  Refined white flour and flour products, such as bread, pasta, snack foods, and cereals. Beverages  Sweetened drinks, such as sweet iced tea and soda. Sweets and Desserts  Baked goods, such as cake, cupcakes, pastries, cookies, and cheesecake. The items listed above may not be a complete list of foods and beverages to avoid. Contact your dietitian for more information.  This information is not intended to replace advice given to you by your health care provider. Make sure you discuss any questions you have with your health care provider. Document Released: 11/24/2014 Document Revised: 12/16/2015 Document Reviewed: 08/05/2014 Elsevier Interactive Patient Education  2017 [employer].

## 2016-10-13 NOTE — Assessment & Plan Note (Signed)
She lost 10 lbs since last visit. I commended her on losing this much weight. Continue diet and exercise plan. She already established nutritionist care at her church. Continue to monitor weight.

## 2016-10-13 NOTE — Progress Notes (Signed)
Subjective:     Patient ID: Angie [name], female   DOB: Sep 19, 1945, 71 y.o.   MRN: [id number]  HPI Pre-DM/Obesity: Here for follow up. She is working on diet and exercise for weight loss. She attends a nutrition class at her church every Tuesday which she felt is helpful. Denies any concern today. HTN:She is compliant with her meds. Need refill. HLD: She self D/C Pravachol due to muscle aches. Med refill: Need refill of all meds she is on. HM: Routine health call follow up.  Current Outpatient Prescriptions on File Prior to Visit  Medication Sig Dispense Refill  . albuterol (PROVENTIL HFA;VENTOLIN HFA) 108 (90 Base) MCG/ACT inhaler Inhale 1-2 puffs into the lungs every 6 (six) hours as needed for wheezing or shortness of breath. 18 g 2  . clobetasol cream (TEMOVATE) 0.09 % Apply 1 application topically 2 (two) times daily. 60 g 2  . esomeprazole (NEXIUM) 40 MG capsule Take 1 capsule (40 mg total) by mouth daily as needed. 90 capsule 2  . eszopiclone (LUNESTA) 2 MG TABS tablet Take 1 tablet (2 mg total) by mouth at bedtime as needed for sleep. Take immediately before bedtime 90 tablet 2  . gabapentin (NEURONTIN) 300 MG capsule Take 2 capsules (600 mg total) by mouth at bedtime. take two tablets before bed for hot flashes 180 capsule 2  . hydrochlorothiazide (HYDRODIURIL) 25 MG tablet Take 1 tablet (25 mg total) by mouth daily. 90 tablet 2  . Multiple Vitamin (MULTIVITAMIN) capsule Take 1 capsule by mouth daily.    . SUMAtriptan (IMITREX) 25 MG tablet take one tablet at start of headache. May repeat in 2 hours x 1 if needed 30 tablet 2  . valACYclovir (VALTREX) 500 MG tablet Take 1 tablet (500 mg total) by mouth daily. 90 tablet 2  . Elastic Bandages & Supports (FUTURO FIRM COMPRESSION HOSE) MISC Ted Hose, medium compression     . pravastatin (PRAVACHOL) 20 MG tablet Take 1 tablet (20 mg total) by mouth daily. (Patient not taking: Reported on 10/13/2016) 90 tablet 2   No current  facility-administered medications on file prior to visit.    Past Medical History:  Diagnosis Date  . Chronic back pain   . Diverticulosis   . Herpes zoster   . Hyperlipidemia   . Hypertension   . Migraine   . Palpitations 09/08/2014  . 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  Never received treatment.  No signs of active disease and low risk for reactivation.  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  Patient declines referral to health department to discuss at this time.      Review of Systems  Respiratory: Negative.   Cardiovascular: Negative.   Gastrointestinal: Negative.   Genitourinary: Negative.   All other systems reviewed and are negative.  Vitals:   10/13/16 0906  BP: 120/64  Pulse: 73  Temp: 98.3 F (36.8 C)  TempSrc: Oral  SpO2: 98%  Weight: 202 lb (91.6 kg)  Height: 5\' 3"  (1.6 m)       Objective:   Physical Exam  Constitutional: She is oriented to person, place, and time. She appears well-developed. No distress.  Cardiovascular: Normal rate, regular rhythm and normal heart sounds.   No murmur heard. Pulmonary/Chest: Effort normal and breath sounds normal. No respiratory distress. She has no wheezes. She exhibits no tenderness.  Abdominal: Soft. Bowel sounds are normal. She exhibits no distension and no mass. There is no tenderness. There is no  rebound and no guarding.  Musculoskeletal: Normal range of motion. She exhibits no edema.  Neurological: She is alert and oriented to person, place, and time. No cranial nerve deficit.  Psychiatric: She has a normal mood and affect. Her behavior is normal.  Nursing note and vitals reviewed.      Assessment:     Pre-DM Obesity   HTN HLD Med refill Health maintenance Plan:     Check problem list.  For her health maintenance: Flu shot offered today but she declined. I refilled most of her meds today.

## 2016-10-13 NOTE — Assessment & Plan Note (Signed)
Here for follow up. A1C today = 5.9 which is an improvement from the previous. I commended her on doing a great job with her diet and exercise. We will continue same management for now.

## 2016-10-14 LAB — BASIC METABOLIC PANEL
BUN/Creatinine Ratio: 16 (ref 12–28)
BUN: 12 mg/dL (ref 8–27)
CO2: 29 mmol/L (ref 18–29)
Calcium: 10.2 mg/dL (ref 8.7–10.3)
Chloride: 100 mmol/L (ref 96–106)
Creatinine, Ser: 0.74 mg/dL (ref 0.57–1.00)
GFR calc Af Amer: 96 mL/min/{1.73_m2} (ref >59)
GFR calc non Af Amer: 83 mL/min/{1.73_m2} (ref >59)
Glucose: 112 mg/dL — ABNORMAL HIGH (ref 65–99)
Potassium: 4 mmol/L (ref 3.5–5.2)
Sodium: 143 mmol/L (ref 134–144)

## 2016-10-14 LAB — LIPID PANEL
Chol/HDL Ratio: 3.7 ratio units (ref 0.0–4.4)
Cholesterol, Total: 216 mg/dL — ABNORMAL HIGH (ref 100–199)
HDL: 58 mg/dL (ref >39)
LDL Calculated: 140 mg/dL — ABNORMAL HIGH (ref 0–99)
Triglycerides: 92 mg/dL (ref 0–149)
VLDL Cholesterol Cal: 18 mg/dL (ref 5–40)

## 2016-10-16 ENCOUNTER — Telehealth: Payer: Self-pay | Admitting: Family Medicine

## 2016-10-16 NOTE — Telephone Encounter (Signed)
Result discussed with patient.  FLP with ASCD risk of 10.7% for a 71 Y/O woman ( Note she is 31). I recommended getting on a different Cholesterol regimen since she had myalgia with all the Statin she had tried. Plan was to try Zetia. She declined at this time. She will prefer to continue working on her diet and exercise. I recommended that she start ASA 81 mg qd. She agreed with plan and verbalized understanding.

## 2016-12-14 ENCOUNTER — Other Ambulatory Visit: Payer: Self-pay | Admitting: Family Medicine

## 2016-12-14 MED ORDER — CLOBETASOL PROPIONATE 0.05 % EX CREA: 1.0000 "application " | TOPICAL_CREAM | Freq: Two times a day (BID) | CUTANEOUS | 1 refills | Status: DC

## 2016-12-14 NOTE — Telephone Encounter (Signed)
Pt needs a refill on clobetasol cream. [facility] ep

## 2016-12-26 DIAGNOSIS — M47812 Spondylosis without myelopathy or radiculopathy, cervical region: Secondary | ICD-10-CM | POA: Diagnosis not present

## 2017-01-11 ENCOUNTER — Other Ambulatory Visit: Payer: Self-pay | Admitting: Family Medicine

## 2017-01-11 MED ORDER — ESOMEPRAZOLE MAGNESIUM 20 MG PO CPDR: 20.0000 mg | DELAYED_RELEASE_CAPSULE | Freq: Every day | ORAL | 2 refills | Status: DC | PRN

## 2017-01-11 NOTE — Telephone Encounter (Signed)
Pt called and needs a refill on her Nexium called in. jw

## 2017-01-11 NOTE — Telephone Encounter (Signed)
patient calling to request refill of:  Name of Medication(s):  Nexium Last date of OV:  10/13/2016 Pharmacy:  [facility]  Will route refill request to PCP. Jazmin Hartsell,CMA

## 2017-01-15 ENCOUNTER — Other Ambulatory Visit: Payer: Self-pay | Admitting: Family Medicine

## 2017-01-15 MED ORDER — ESOMEPRAZOLE MAGNESIUM 20 MG PO CPDR: 20.0000 mg | DELAYED_RELEASE_CAPSULE | Freq: Every day | ORAL | 2 refills | Status: DC | PRN

## 2017-01-15 NOTE — Telephone Encounter (Signed)
Pt states Nexium was called into the wrong pharmacy. Pt needs it to go to the [place]. ep

## 2017-03-30 ENCOUNTER — Encounter: Payer: Self-pay | Admitting: Family Medicine

## 2017-03-30 ENCOUNTER — Ambulatory Visit (INDEPENDENT_AMBULATORY_CARE_PROVIDER_SITE_OTHER): Payer: Medicare Other | Admitting: Family Medicine

## 2017-03-30 VITALS — BP 130/66 | HR 75 | Temp 98.2°F | Wt 196.0 lb

## 2017-03-30 DIAGNOSIS — G4700 Insomnia, unspecified: Secondary | ICD-10-CM | POA: Diagnosis not present

## 2017-03-30 DIAGNOSIS — R7309 Other abnormal glucose: Secondary | ICD-10-CM

## 2017-03-30 DIAGNOSIS — R7303 Prediabetes: Secondary | ICD-10-CM | POA: Diagnosis not present

## 2017-03-30 DIAGNOSIS — J330 Polyp of nasal cavity: Secondary | ICD-10-CM | POA: Diagnosis not present

## 2017-03-30 DIAGNOSIS — R202 Paresthesia of skin: Secondary | ICD-10-CM

## 2017-03-30 DIAGNOSIS — E785 Hyperlipidemia, unspecified: Secondary | ICD-10-CM

## 2017-03-30 DIAGNOSIS — I10 Essential (primary) hypertension: Secondary | ICD-10-CM

## 2017-03-30 LAB — POCT GLYCOSYLATED HEMOGLOBIN (HGB A1C): Hemoglobin A1C: 5.6

## 2017-03-30 MED ORDER — OLOPATADINE HCL 0.6 % NA SOLN: 2.0000 [drp] | Freq: Two times a day (BID) | NASAL | 1 refills | Status: DC

## 2017-03-30 MED ORDER — TRAZODONE HCL 50 MG PO TABS: 25.0000 mg | ORAL_TABLET | Freq: Every day | ORAL | 0 refills | Status: DC

## 2017-03-30 NOTE — Assessment & Plan Note (Signed)
Doing well.  A1C improved. Continue diet and exercise.

## 2017-03-30 NOTE — Assessment & Plan Note (Signed)
??   Related to pre-dm vs nutritional Recent B12 checked was normal. May use Gabapentin for this. Take MVI. Monitor for now.

## 2017-03-30 NOTE — Progress Notes (Signed)
Subjective:     Patient ID: Angie [name], female   DOB: 28-Mar-1946, 71 y.o.   MRN: [id number]  HPI Pre-DM: Her home CBG check ranges from 99 to max 138. She has been doing well on diet and exercise. Burning: C/O burning feet and hands on and off for few months especially whenever she eats bad. Currently asymptomatic. HTN:She is compliant with her meds, here for follow-up. HLD:She stopped her statin due to cramping.  Insomnia: Don't take Lunesta as often. She takes half of her Angie [name] most of the time.  Nasal discomfort. Also c/o nasal dryness and feels like swelling in her nostrils closing up her nostrils. This has been on going for months. Current Outpatient Prescriptions on File Prior to Visit  Medication Sig Dispense Refill  . albuterol (PROVENTIL HFA;VENTOLIN HFA) 108 (90 Base) MCG/ACT inhaler Inhale 1-2 puffs into the lungs every 6 (six) hours as needed for wheezing or shortness of breath. 18 g 6  . clobetasol cream (TEMOVATE) 0.62 % Apply 1 application topically 2 (two) times daily. 60 g 1  . Elastic Bandages & Supports (FUTURO FIRM COMPRESSION HOSE) MISC Ted Hose, medium compression     . esomeprazole (NEXIUM) 20 MG capsule Take 1 capsule (20 mg total) by mouth daily as needed. 90 capsule 2  . eszopiclone (LUNESTA) 2 MG TABS tablet Take 1 tablet (2 mg total) by mouth at bedtime as needed for sleep. Take immediately before bedtime 90 tablet 2  . gabapentin (NEURONTIN) 300 MG capsule Take 2 capsules (600 mg total) by mouth at bedtime as needed (Hot flash). 180 capsule 2  . hydrochlorothiazide (HYDRODIURIL) 25 MG tablet Take 1 tablet (25 mg total) by mouth daily. 90 tablet 2  . Multiple Vitamin (MULTIVITAMIN) capsule Take 1 capsule by mouth daily.    . pravastatin (PRAVACHOL) 20 MG tablet Take 1 tablet (20 mg total) by mouth daily. (Patient not taking: Reported on 10/13/2016) 90 tablet 2  . SUMAtriptan (IMITREX) 25 MG tablet take one tablet at start of headache. May repeat in 2 hours x  1 if needed 15 tablet 2  . valACYclovir (VALTREX) 500 MG tablet Take 1 tablet (500 mg total) by mouth daily. 90 tablet 2   No current facility-administered medications on file prior to visit.    Past Medical History:  Diagnosis Date  . Chronic back pain   . Diverticulosis   . Herpes zoster   . Hyperlipidemia   . Hypertension   . Migraine   . Palpitations 09/08/2014  . 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  Never received treatment.  No signs of active disease and low risk for reactivation.  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  Patient declines referral to health department to discuss at this time.    Vitals:   03/30/17 1013  BP: 130/66  Pulse: 75  Temp: 98.2 F (36.8 C)  TempSrc: Oral  SpO2: 94%  Weight: 196 lb (88.9 kg)     Review of Systems  Respiratory: Negative.   Cardiovascular: Negative.   Gastrointestinal: Negative.   Genitourinary: Negative.   Neurological:       Tingling  Psychiatric/Behavioral: Positive for sleep disturbance. The patient is not nervous/anxious.   All other systems reviewed and are negative.      Objective:   Physical Exam  Constitutional: She is oriented to person, place, and time. She appears well-developed. No distress.  HENT:  Right Ear: Tympanic membrane normal.  Left Ear: Tympanic membrane normal.  Mouth/Throat: Uvula is midline, oropharynx is clear and moist and mucous membranes are normal.  Inflamed nasal polyps.  Cardiovascular: Normal rate, regular rhythm and normal heart sounds.   No murmur heard. Pulmonary/Chest: Effort normal and breath sounds normal. No respiratory distress. She has no wheezes.  Abdominal: Soft. Bowel sounds are normal. She exhibits no distension and no mass. There is no tenderness.  Musculoskeletal: Normal range of motion. She exhibits no edema.  Neurological: She is alert and oriented to person, place, and time. She has normal reflexes. No cranial nerve deficit.  Nursing note and  vitals reviewed.      Assessment:     Pre-DM: Paresthesia HTN: HLD: Insomnia: Nasal discomfort and dryness    Plan:     Check problem list.

## 2017-03-30 NOTE — Assessment & Plan Note (Signed)
She will try to get back on her Pravachol. If still having cramping we will consider Zetia.

## 2017-03-30 NOTE — Assessment & Plan Note (Signed)
Some element of sinusitis. Start Patanase. Monitor for improvement.

## 2017-03-30 NOTE — Assessment & Plan Note (Addendum)
I counseled her against Lunesta as it is part of the beer list meds. She will try Trazodone for now. She prefers starting a lower dose with gradual taper up. Start 25 mg qhs for now. Call if no improvement.

## 2017-03-30 NOTE — Patient Instructions (Signed)

## 2017-03-30 NOTE — Assessment & Plan Note (Signed)
BP looks good. No change to her meds today.

## 2017-04-25 ENCOUNTER — Encounter: Payer: Self-pay | Admitting: *Deleted

## 2017-04-25 ENCOUNTER — Ambulatory Visit (INDEPENDENT_AMBULATORY_CARE_PROVIDER_SITE_OTHER): Payer: Medicare Other | Admitting: *Deleted

## 2017-04-25 VITALS — BP 138/70 | HR 70 | Temp 97.8°F | Ht 63.0 in | Wt 198.2 lb

## 2017-04-25 DIAGNOSIS — Z0000 Encounter for general adult medical examination without abnormal findings: Secondary | ICD-10-CM

## 2017-04-25 DIAGNOSIS — Z23 Encounter for immunization: Secondary | ICD-10-CM

## 2017-04-25 NOTE — Progress Notes (Signed)
Subjective:   Angie [name] is a 71 y.o. female who presents for Medicare Annual (Subsequent) preventive examination.  Cardiac Risk Factors include: advanced age (>12men, >31 women);dyslipidemia;obesity (BMI >30kg/m2);smoking/ tobacco exposure     Objective:     Vitals: BP 138/70 (BP Location: Right Arm, Patient Position: Sitting, Cuff Size: Large)   Pulse 70   Temp 97.8 F (36.6 C) (Oral)   Ht 5\' 3"  (1.6 m)   Wt 198 lb 3.2 oz (89.9 kg)   SpO2 96%   BMI 35.11 kg/m   Body mass index is 35.11 kg/m.   Tobacco History  Smoking Status  . Former Smoker  . Packs/day: 0.50  . Years: 30.00  . Types: Cigarettes  . Quit date: 07/24/1993  Smokeless Tobacco  . Never Used     Patient is former smoker with no plans to restart   Past Medical History:  Diagnosis Date  . Chronic back pain   . Diverticulosis   . Herpes zoster   . Hyperlipidemia   . Hypertension   . Migraine   . Palpitations 09/08/2014  . 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  Never received treatment.  No signs of active disease and low risk for reactivation.  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  Patient declines referral to health department to discuss at this time.    Past Surgical History:  Procedure Laterality Date  . CARPAL TUNNEL RELEASE Bilateral 2012  . CHOLECYSTECTOMY  1982  . ROTATOR CUFF REPAIR Bilateral 2009  . TUBAL LIGATION     Family History  Problem Relation Age of Onset  . Diabetes Mother   . Stroke Mother   . Hypertension Mother   . Diabetes Father   . Kidney disease Father   . Heart attack Father   . Diabetes Sister   . Heart disease Sister   . Heart attack Sister   . Alcohol abuse Brother   . Diabetes Sister   . Gout Sister    History  Sexual Activity  . Sexual activity: Not Currently    Outpatient Encounter Prescriptions as of 04/25/2017  Medication Sig  . aspirin EC 81 MG tablet Take 81 mg by mouth daily.  . clobetasol cream (TEMOVATE) 4.69  % Apply 1 application topically 2 (two) times daily.  Marland Kitchen esomeprazole (NEXIUM) 20 MG capsule Take 1 capsule (20 mg total) by mouth daily as needed.  . gabapentin (NEURONTIN) 300 MG capsule Take 2 capsules (600 mg total) by mouth at bedtime as needed (Hot flash).  . hydrochlorothiazide (HYDRODIURIL) 25 MG tablet Take 1 tablet (25 mg total) by mouth daily.  . Multiple Vitamin (MULTIVITAMIN) capsule Take 1 capsule by mouth daily.  . Olopatadine HCl 0.6 % SOLN Place 2 drops into the nose 2 (two) times daily.  . SUMAtriptan (IMITREX) 25 MG tablet take one tablet at start of headache. May repeat in 2 hours x 1 if needed  . traZODone (DESYREL) 50 MG tablet Take 0.5 tablets (25 mg total) by mouth at bedtime.  . valACYclovir (VALTREX) 500 MG tablet Take 1 tablet (500 mg total) by mouth daily.  Water engineer Bandages & Supports (FUTURO FIRM COMPRESSION HOSE) MISC Ted Hose, medium compression   . [DISCONTINUED] eszopiclone (LUNESTA) 2 MG TABS tablet Take 1 tablet (2 mg total) by mouth at bedtime as needed for sleep. Take immediately before bedtime (Patient not taking: Reported on 04/25/2017)  . [DISCONTINUED] pravastatin (PRAVACHOL) 20 MG tablet Take 1 tablet (20 mg  total) by mouth daily. (Patient not taking: Reported on 03/30/2017)   No facility-administered encounter medications on file as of 04/25/2017.     Activities of Daily Living In your present state of health, do you have any difficulty performing the following activities: 04/25/2017  Hearing? N  Vision? Y  Difficulty concentrating or making decisions? Y  Walking or climbing stairs? N  Dressing or bathing? N  Doing errands, shopping? N  Preparing Food and eating ? N  Using the Toilet? N  In the past six months, have you accidently leaked urine? N  Do you have problems with loss of bowel control? N  Managing your Medications? N  Managing your Finances? N  Housekeeping or managing your Housekeeping? N  Some recent data might be hidden   Home  Safety:  My home has a working smoke alarm:  Yes X 3            My home throw rugs have been fastened down to the floor or removed:  No, discussed removing or taking down I have a non-slip surface or non-slip mats in the bathtub and shower:  Non-slip surface        All my home's stairs have handrails, including any outdoor stairs  Two level home with handrails inside and 6 outside steps with handrails          My home's floors, stairs and hallways are free from clutter, wires and cords:  Yes     I have animals in my home  No I wear seatbelts consistently:  Yes    Patient Care Team: [name], MD as PCP - General (Family Medicine) Juanita Craver, MD as Consulting Physician (Gastroenterology) Keene Breath., MD (Ophthalmology) Elza Rafter, MD as Consulting Physician (Family Medicine) Almedia Balls, MD as Referring Physician (Orthopedic Surgery)    Assessment:     Exercise Activities and Dietary recommendations Current Exercise Habits: Structured exercise class, Type of exercise: strength training/weights (step aerobics), Time (Minutes): 60, Frequency (Times/Week): 3, Weekly Exercise (Minutes/Week): 180, Intensity: Moderate, Exercise limited by: None identified  Goals    . Sleep at least 8 hours per night (pt-stated)    . Weight (lb) < 188 lb (85.3 kg) (pt-stated)          5 % weight loss     Patient uses My Fitness Pal app on phone  Fall Risk Fall Risk  04/25/2017 03/30/2017 10/13/2016 02/08/2016 06/22/2015  Falls in the past year? No No No No No  Risk for fall due to : Impaired balance/gait - - - -  Risk for fall due to: Comment 2/2 vertigo - - - -   Depression Screen PHQ 2/9 Scores 04/25/2017 03/30/2017 10/13/2016 02/08/2016  PHQ - 2 Score 0 0 0 0    TUG Test:  Done in 11 seconds. Patient used one hand to sit back down only. Falls prevention discussed in detail and literature given.  Cognitive Function: Mini-Cog  Passed with score 5/5    Immunization History    Administered Date(s) Administered  . Pneumococcal Conjugate-13 09/08/2014  . Pneumococcal Polysaccharide-23 02/08/2016  . Td 05/21/2009   Screening Tests Health Maintenance  Topic Date Due  . INFLUENZA VACCINE  02/21/2017  . MAMMOGRAM  08/07/2018  . TETANUS/TDAP  05/22/2019  . COLONOSCOPY  07/12/2021  . DEXA SCAN  Completed  . Hepatitis C Screening  Completed  . PNA vac Low Risk Adult  Completed      Flu vaccine administered today  Plan:  I have personally reviewed and noted the following in the patient's chart:   . Medical and social history . Use of alcohol, tobacco or illicit drugs  . Current medications and supplements . Functional ability and status . Nutritional status . Physical activity . Advanced directives . List of other physicians . Hospitalizations, surgeries, and ER visits in previous 12 months . Vitals . Screenings to include cognitive, depression, and falls . Referrals and appointments  In addition, I have reviewed and discussed with patient certain preventive protocols, quality metrics, and best practice recommendations. A written personalized care plan for preventive services as well as general preventive health recommendations were provided to patient.     Velora Heckler, RN  04/25/2017

## 2017-04-25 NOTE — Progress Notes (Signed)
[facility] ATTENDING NOTE Angie Sylla,MD I  have reviewed their chart. I have discussed this patient with the RN. I agree with the RN's findings, assessment and care plan.

## 2017-04-25 NOTE — Patient Instructions (Addendum)
Angie [name],  Thank you for taking time to come for yourMedicare Wellness Visit. I appreciate your ongoing commitment to your health goals. Please review the following plan we discussed and let me know if I can assist you in the future.   These are the goals we discussed:  Goals    . Sleep at least 8 hours per night (pt-stated)    . Weight (lb) < 188 lb (85.3 kg) (pt-stated)          5 % weight loss       Fat and Cholesterol Restricted Diet Getting too much fat and cholesterol in your diet may cause health problems. Following this diet helps keep your fat and cholesterol at normal levels. This can keep you from getting sick. What types of fat should I choose?  Choose monosaturated and polyunsaturated fats. These are found in foods such as olive oil, canola oil, flaxseeds, walnuts, almonds, and seeds.  Eat more omega-3 fats. Good choices include salmon, mackerel, sardines, tuna, flaxseed oil, and ground flaxseeds.  Limit saturated fats. These are in animal products such as meats, butter, and cream. They can also be in plant products such as palm oil, palm kernel oil, and coconut oil.  Avoid foods with partially hydrogenated oils in them. These contain trans fats. Examples of foods that have trans fats are stick margarine, some tub margarines, cookies, crackers, and other baked goods. What general guidelines do I need to follow?  Check food labels. Look for the words "trans fat" and "saturated fat."  When preparing a meal: ? Fill half of your plate with vegetables and green salads. ? Fill one fourth of your plate with whole grains. Look for the word "whole" as the first word in the ingredient list. ? Fill one fourth of your plate with lean protein foods.  Eat more foods that have fiber, like apples, carrots, beans, peas, and barley.  Eat more home-cooked foods. Eat less at restaurants and buffets.  Limit or avoid alcohol.  Limit foods high in starch and sugar.  Limit  fried foods.  Cook foods without frying them. Baking, boiling, grilling, and broiling are all great options.  Lose weight if you are overweight. Losing even a small amount of weight can help your overall health. It can also help prevent diseases such as diabetes and heart disease. What foods can I eat? Grains Whole grains, such as whole wheat or whole grain breads, crackers, cereals, and pasta. Unsweetened oatmeal, bulgur, barley, quinoa, or brown rice. Corn or whole wheat flour tortillas. Vegetables Fresh or frozen vegetables (raw, steamed, roasted, or grilled). Green salads. Fruits All fresh, canned (in natural juice), or frozen fruits. Meat and Other Protein Products Ground beef (85% or leaner), grass-fed beef, or beef trimmed of fat. Skinless chicken or [place]. Ground chicken or [place]. Pork trimmed of fat. All fish and seafood. Eggs. Dried beans, peas, or lentils. Unsalted nuts or seeds. Unsalted canned or dry beans. Dairy Low-fat dairy products, such as skim or 1% milk, 2% or reduced-fat cheeses, low-fat ricotta or cottage cheese, or plain low-fat yogurt. Fats and Oils Tub margarines without trans fats. Light or reduced-fat mayonnaise and salad dressings. Avocado. Olive, canola, sesame, or safflower oils. Natural peanut or almond butter (choose ones without added sugar and oil). The items listed above may not be a complete list of recommended foods or beverages. Contact your dietitian for more options. What foods are not recommended? Grains White bread. White pasta. White rice. Cornbread. Bagels, pastries, and  croissants. Crackers that contain trans fat. Vegetables White potatoes. Corn. Creamed or fried vegetables. Vegetables in a cheese sauce. Fruits Dried fruits. Canned fruit in light or heavy syrup. Fruit juice. Meat and Other Protein Products Fatty cuts of meat. Ribs, chicken wings, bacon, sausage, bologna, salami, chitterlings, fatback, hot dogs, bratwurst, and packaged luncheon  meats. Liver and organ meats. Dairy Whole or 2% milk, cream, half-and-half, and cream cheese. Whole milk cheeses. Whole-fat or sweetened yogurt. Full-fat cheeses. Nondairy creamers and whipped toppings. Processed cheese, cheese spreads, or cheese curds. Sweets and Desserts Corn syrup, sugars, honey, and molasses. Candy. Jam and jelly. Syrup. Sweetened cereals. Cookies, pies, cakes, donuts, muffins, and ice cream. Fats and Oils Butter, stick margarine, lard, shortening, ghee, or bacon fat. Coconut, palm kernel, or palm oils. Beverages Alcohol. Sweetened drinks (such as sodas, lemonade, and fruit drinks or punches). The items listed above may not be a complete list of foods and beverages to avoid. Contact your dietitian for more information. This information is not intended to replace advice given to you by your health care provider. Make sure you discuss any questions you have with your health care provider. Document Released: 01/09/2012 Document Revised: 03/16/2016 Document Reviewed: 10/09/2013 Elsevier Interactive Patient Education  2018 [place] in the Home Falls can cause injuries. They can happen to people of all ages. There are many things you can do to make your home safe and to help prevent falls. What can I do on the outside of my home?  Regularly fix the edges of walkways and driveways and fix any cracks.  Remove anything that might make you trip as you walk through a door, such as a raised step or threshold.  Trim any bushes or trees on the path to your home.  Use bright outdoor lighting.  Clear any walking paths of anything that might make someone trip, such as rocks or tools.  Regularly check to see if handrails are loose or broken. Make sure that both sides of any steps have handrails.  Any raised decks and porches should have guardrails on the edges.  Have any leaves, snow, or ice cleared regularly.  Use sand or salt on walking paths during  winter.  Clean up any spills in your garage right away. This includes oil or grease spills. What can I do in the bathroom?  Use night lights.  Install grab bars by the toilet and in the tub and shower. Do not use towel bars as grab bars.  Use non-skid mats or decals in the tub or shower.  If you need to sit down in the shower, use a plastic, non-slip stool.  Keep the floor dry. Clean up any water that spills on the floor as soon as it happens.  Remove soap buildup in the tub or shower regularly.  Attach bath mats securely with double-sided non-slip rug tape.  Do not have throw rugs and other things on the floor that can make you trip. What can I do in the bedroom?  Use night lights.  Make sure that you have a light by your bed that is easy to reach.  Do not use any sheets or blankets that are too big for your bed. They should not hang down onto the floor.  Have a firm chair that has side arms. You can use this for support while you get dressed.  Do not have throw rugs and other things on the floor that can make you trip. What can  I do in the kitchen?  Clean up any spills right away.  Avoid walking on wet floors.  Keep items that you use a lot in easy-to-reach places.  If you need to reach something above you, use a strong step stool that has a grab bar.  Keep electrical cords out of the way.  Do not use floor polish or wax that makes floors slippery. If you must use wax, use non-skid floor wax.  Do not have throw rugs and other things on the floor that can make you trip. What can I do with my stairs?  Do not leave any items on the stairs.  Make sure that there are handrails on both sides of the stairs and use them. Fix handrails that are broken or loose. Make sure that handrails are as long as the stairways.  Check any carpeting to make sure that it is firmly attached to the stairs. Fix any carpet that is loose or worn.  Avoid having throw rugs at the top or  bottom of the stairs. If you do have throw rugs, attach them to the floor with carpet tape.  Make sure that you have a light switch at the top of the stairs and the bottom of the stairs. If you do not have them, ask someone to add them for you. What else can I do to help prevent falls?  Wear shoes that: ? Do not have high heels. ? Have rubber bottoms. ? Are comfortable and fit you well. ? Are closed at the toe. Do not wear sandals.  If you use a stepladder: ? Make sure that it is fully opened. Do not climb a closed stepladder. ? Make sure that both sides of the stepladder are locked into place. ? Ask someone to hold it for you, if possible.  Clearly mark and make sure that you can see: ? Any grab bars or handrails. ? First and last steps. ? Where the edge of each step is.  Use tools that help you move around (mobility aids) if they are needed. These include: ? Canes. ? Walkers. ? Scooters. ? Crutches.  Turn on the lights when you go into a dark area. Replace any light bulbs as soon as they burn out.  Set up your furniture so you have a clear path. Avoid moving your furniture around.  If any of your floors are uneven, fix them.  If there are any pets around you, be aware of where they are.  Review your medicines with your doctor. Some medicines can make you feel dizzy. This can increase your chance of falling. Ask your doctor what other things that you can do to help prevent falls. This information is not intended to replace advice given to you by your health care provider. Make sure you discuss any questions you have with your health care provider. Document Released: 05/06/2009 Document Revised: 12/16/2015 Document Reviewed: 08/14/2014 Elsevier Interactive Patient Education  2018 [place] Maintenance, Female Adopting a healthy lifestyle and getting preventive care can go a long way to promote health and wellness. Talk with your health care provider about what  schedule of regular examinations is right for you. This is a good chance for you to check in with your provider about disease prevention and staying healthy. In between checkups, there are plenty of things you can do on your own. Experts have done a lot of research about which lifestyle changes and preventive measures are most likely to keep you healthy.  Ask your health care provider for more information. Weight and diet Eat a healthy diet  Be sure to include plenty of vegetables, fruits, low-fat dairy products, and lean protein.  Do not eat a lot of foods high in solid fats, added sugars, or salt.  Get regular exercise. This is one of the most important things you can do for your health. ? Most adults should exercise for at least 150 minutes each week. The exercise should increase your heart rate and make you sweat (moderate-intensity exercise). ? Most adults should also do strengthening exercises at least twice a week. This is in addition to the moderate-intensity exercise.  Maintain a healthy weight  Body mass index (BMI) is a measurement that can be used to identify possible weight problems. It estimates body fat based on height and weight. Your health care provider can help determine your BMI and help you achieve or maintain a healthy weight.  For females 70 years of age and older: ? A BMI below 18.5 is considered underweight. ? A BMI of 18.5 to 24.9 is normal. ? A BMI of 25 to 29.9 is considered overweight. ? A BMI of 30 and above is considered obese.  Watch levels of cholesterol and blood lipids  You should start having your blood tested for lipids and cholesterol at 70 years of age, then have this test every 5 years.  You may need to have your cholesterol levels checked more often if: ? Your lipid or cholesterol levels are high. ? You are older than 70 years of age. ? You are at high risk for heart disease.  Cancer screening Lung Cancer  Lung cancer screening is recommended  for adults 23-1 years old who are at high risk for lung cancer because of a history of smoking.  A yearly low-dose CT scan of the lungs is recommended for people who: ? Currently smoke. ? Have quit within the past 15 years. ? Have at least a 30-pack-year history of smoking. A pack year is smoking an average of one pack of cigarettes a day for 1 year.  Yearly screening should continue until it has been 15 years since you quit.  Yearly screening should stop if you develop a health problem that would prevent you from having lung cancer treatment.  Breast Cancer  Practice breast self-awareness. This means understanding how your breasts normally appear and feel.  It also means doing regular breast self-exams. Let your health care provider know about any changes, no matter how small.  If you are in your 20s or 30s, you should have a clinical breast exam (CBE) by a health care provider every 1-3 years as part of a regular health exam.  If you are 54 or older, have a CBE every year. Also consider having a breast X-ray (mammogram) every year.  If you have a family history of breast cancer, talk to your health care provider about genetic screening.  If you are at high risk for breast cancer, talk to your health care provider about having an MRI and a mammogram every year.  Breast cancer gene (BRCA) assessment is recommended for women who have family members with BRCA-related cancers. BRCA-related cancers include: ? Breast. ? Ovarian. ? Tubal. ? Peritoneal cancers.  Results of the assessment will determine the need for genetic counseling and BRCA1 and BRCA2 testing.  Cervical Cancer Your health care provider may recommend that you be screened regularly for cancer of the pelvic organs (ovaries, uterus, and vagina). This screening involves  a pelvic examination, including checking for microscopic changes to the surface of your cervix (Pap test). You may be encouraged to have this screening done  every 3 years, beginning at age 59.  For women ages 13-65, health care providers may recommend pelvic exams and Pap testing every 3 years, or they may recommend the Pap and pelvic exam, combined with testing for human papilloma virus (HPV), every 5 years. Some types of HPV increase your risk of cervical cancer. Testing for HPV may also be done on women of any age with unclear Pap test results.  Other health care providers may not recommend any screening for nonpregnant women who are considered low risk for pelvic cancer and who do not have symptoms. Ask your health care provider if a screening pelvic exam is right for you.  If you have had past treatment for cervical cancer or a condition that could lead to cancer, you need Pap tests and screening for cancer for at least 20 years after your treatment. If Pap tests have been discontinued, your risk factors (such as having a new sexual partner) need to be reassessed to determine if screening should resume. Some women have medical problems that increase the chance of getting cervical cancer. In these cases, your health care provider may recommend more frequent screening and Pap tests.  Colorectal Cancer  This type of cancer can be detected and often prevented.  Routine colorectal cancer screening usually begins at 70 years of age and continues through 70 years of age.  Your health care provider may recommend screening at an earlier age if you have risk factors for colon cancer.  Your health care provider may also recommend using home test kits to check for hidden blood in the stool.  A small camera at the end of a tube can be used to examine your colon directly (sigmoidoscopy or colonoscopy). This is done to check for the earliest forms of colorectal cancer.  Routine screening usually begins at age 26.  Direct examination of the colon should be repeated every 5-10 years through 70 years of age. However, you may need to be screened more often if  early forms of precancerous polyps or small growths are found.  Skin Cancer  Check your skin from head to toe regularly.  Tell your health care provider about any new moles or changes in moles, especially if there is a change in a mole's shape or color.  Also tell your health care provider if you have a mole that is larger than the size of a pencil eraser.  Always use sunscreen. Apply sunscreen liberally and repeatedly throughout the day.  Protect yourself by wearing long sleeves, pants, a wide-brimmed hat, and sunglasses whenever you are outside.  Heart disease, diabetes, and high blood pressure  High blood pressure causes heart disease and increases the risk of stroke. High blood pressure is more likely to develop in: ? People who have blood pressure in the high end of the normal range (130-139/85-89 mm Hg). ? People who are overweight or obese. ? People who are African American.  If you are 52-8 years of age, have your blood pressure checked every 3-5 years. If you are 71 years of age or older, have your blood pressure checked every year. You should have your blood pressure measured twice-once when you are at a hospital or clinic, and once when you are not at a hospital or clinic. Record the average of the two measurements. To check your blood pressure when  you are not at a hospital or clinic, you can use: ? An automated blood pressure machine at a pharmacy. ? A home blood pressure monitor.  If you are between 56 years and 96 years old, ask your health care provider if you should take aspirin to prevent strokes.  Have regular diabetes screenings. This involves taking a blood sample to check your fasting blood sugar level. ? If you are at a normal weight and have a low risk for diabetes, have this test once every three years after 70 years of age. ? If you are overweight and have a high risk for diabetes, consider being tested at a younger age or more often. Preventing  infection Hepatitis B  If you have a higher risk for hepatitis B, you should be screened for this virus. You are considered at high risk for hepatitis B if: ? You were born in a country where hepatitis B is common. Ask your health care provider which countries are considered high risk. ? Your parents were born in a high-risk country, and you have not been immunized against hepatitis B (hepatitis B vaccine). ? You have HIV or AIDS. ? You use needles to inject street drugs. ? You live with someone who has hepatitis B. ? You have had sex with someone who has hepatitis B. ? You get hemodialysis treatment. ? You take certain medicines for conditions, including cancer, organ transplantation, and autoimmune conditions.  Hepatitis C  Blood testing is recommended for: ? Everyone born from [birth year] through 1964. ? Anyone with known risk factors for hepatitis C.  Sexually transmitted infections (STIs)  You should be screened for sexually transmitted infections (STIs) including gonorrhea and chlamydia if: ? You are sexually active and are younger than 70 years of age. ? You are older than 70 years of age and your health care provider tells you that you are at risk for this type of infection. ? Your sexual activity has changed since you were last screened and you are at an increased risk for chlamydia or gonorrhea. Ask your health care provider if you are at risk.  If you do not have HIV, but are at risk, it may be recommended that you take a prescription medicine daily to prevent HIV infection. This is called pre-exposure prophylaxis (PrEP). You are considered at risk if: ? You are sexually active and do not regularly use condoms or know the HIV status of your partner(s). ? You take drugs by injection. ? You are sexually active with a partner who has HIV.  Talk with your health care provider about whether you are at high risk of being infected with HIV. If you choose to begin PrEP, you should first be  tested for HIV. You should then be tested every 3 months for as long as you are taking PrEP. Pregnancy  If you are premenopausal and you may become pregnant, ask your health care provider about preconception counseling.  If you may become pregnant, take 400 to 800 micrograms (mcg) of folic acid every day.  If you want to prevent pregnancy, talk to your health care provider about birth control (contraception). Osteoporosis and menopause  Osteoporosis is a disease in which the bones lose minerals and strength with aging. This can result in serious bone fractures. Your risk for osteoporosis can be identified using a bone density scan.  If you are 75 years of age or older, or if you are at risk for osteoporosis and fractures, ask your health care provider  if you should be screened.  Ask your health care provider whether you should take a calcium or vitamin D supplement to lower your risk for osteoporosis.  Menopause may have certain physical symptoms and risks.  Hormone replacement therapy may reduce some of these symptoms and risks. Talk to your health care provider about whether hormone replacement therapy is right for you. Follow these instructions at home:  Schedule regular health, dental, and eye exams.  Stay current with your immunizations.  Do not use any tobacco products including cigarettes, chewing tobacco, or electronic cigarettes.  If you are pregnant, do not drink alcohol.  If you are breastfeeding, limit how much and how often you drink alcohol.  Limit alcohol intake to no more than 1 drink per day for nonpregnant women. One drink equals 12 ounces of beer, 5 ounces of wine, or 1 ounces of hard liquor.  Do not use street drugs.  Do not share needles.  Ask your health care provider for help if you need support or information about quitting drugs.  Tell your health care provider if you often feel depressed.  Tell your health care provider if you have ever been abused  or do not feel safe at home. This information is not intended to replace advice given to you by your health care provider. Make sure you discuss any questions you have with your health care provider. Document Released: 01/23/2011 Document Revised: 12/16/2015 Document Reviewed: 04/13/2015 Elsevier Interactive Patient Education  [employer].

## 2017-05-22 ENCOUNTER — Encounter: Payer: Self-pay | Admitting: Family Medicine

## 2017-05-22 ENCOUNTER — Ambulatory Visit (INDEPENDENT_AMBULATORY_CARE_PROVIDER_SITE_OTHER): Payer: Medicare Other | Admitting: Family Medicine

## 2017-05-22 VITALS — BP 128/82 | HR 75 | Temp 98.2°F | Ht 63.0 in | Wt 202.0 lb

## 2017-05-22 DIAGNOSIS — R21 Rash and other nonspecific skin eruption: Secondary | ICD-10-CM | POA: Diagnosis present

## 2017-05-22 NOTE — Progress Notes (Signed)
Subjective:     Patient ID: Angie [name], female   DOB: 26-Jul-1945, 71 y.o.   MRN: [id number]  Rash   Here to follow-up for facial rash right underneath her lower eyelid. This is associated with itching. She stated it feels rough on her face. She had tried multiple topical steroid cream without an improvement.  Current Outpatient Prescriptions on File Prior to Visit  Medication Sig Dispense Refill  . aspirin EC 81 MG tablet Take 81 mg by mouth daily.    . clobetasol cream (TEMOVATE) 3.38 % Apply 1 application topically 2 (two) times daily. 60 g 1  . Elastic Bandages & Supports (FUTURO FIRM COMPRESSION HOSE) MISC Ted Hose, medium compression     . esomeprazole (NEXIUM) 20 MG capsule Take 1 capsule (20 mg total) by mouth daily as needed. 90 capsule 2  . gabapentin (NEURONTIN) 300 MG capsule Take 2 capsules (600 mg total) by mouth at bedtime as needed (Hot flash). 180 capsule 2  . hydrochlorothiazide (HYDRODIURIL) 25 MG tablet Take 1 tablet (25 mg total) by mouth daily. 90 tablet 2  . Multiple Vitamin (MULTIVITAMIN) capsule Take 1 capsule by mouth daily.    . Olopatadine HCl 0.6 % SOLN Place 2 drops into the nose 2 (two) times daily. 3 Bottle 1  . SUMAtriptan (IMITREX) 25 MG tablet take one tablet at start of headache. May repeat in 2 hours x 1 if needed 15 tablet 2  . traZODone (DESYREL) 50 MG tablet Take 0.5 tablets (25 mg total) by mouth at bedtime. 45 tablet 0  . valACYclovir (VALTREX) 500 MG tablet Take 1 tablet (500 mg total) by mouth daily. 90 tablet 2   No current facility-administered medications on file prior to visit.    Past Medical History:  Diagnosis Date  . Chronic back pain   . Diverticulosis   . Herpes zoster   . Hyperlipidemia   . Hypertension   . Migraine   . Palpitations 09/08/2014  . 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  Never received treatment.  No signs of active disease and low risk for reactivation.  Does not require tx for  latent TB.  Patient declines referral to health department to discuss at this time.    Vitals:   05/22/17 1128  BP: 128/82  Pulse: 75  Temp: 98.2 F (36.8 C)  TempSrc: Oral  SpO2: 95%  Weight: 202 lb (91.6 kg)  Height: 5\' 3"  (1.6 m)     Review of Systems  Respiratory: Negative.   Cardiovascular: Negative.   Skin: Positive for rash.  All other systems reviewed and are negative.      Objective:   Physical Exam  Constitutional: She appears well-developed. No distress.  Cardiovascular: Normal rate, regular rhythm and normal heart sounds.   No murmur heard. Pulmonary/Chest: Effort normal and breath sounds normal. No respiratory distress. She has no wheezes.  Skin:     Nursing note and vitals reviewed.      Assessment:     Facial rash    Plan:     Patient feels distressed although I was unable to appreciate any rash. I recommended Aveeno hydrocortisone with Eucerin cream to help smoothen her face and relieve symptoms. If no improvement in 1-2 weeks, she can give me a call and I will refer her to her dermatologist. She agreed with plan.

## 2017-05-22 NOTE — Patient Instructions (Signed)
Please mix Eucerin and Aveeno hydrocortisone cream together and use them on the area with rash and itching. If there is no improvement in 1-2 weeks, please give me a call and I will refer you to a dermatologist.

## 2017-05-30 ENCOUNTER — Other Ambulatory Visit: Payer: Self-pay | Admitting: *Deleted

## 2017-05-30 MED ORDER — OLOPATADINE HCL 0.6 % NA SOLN: 2.0000 [drp] | Freq: Two times a day (BID) | NASAL | 1 refills | Status: DC

## 2017-05-30 NOTE — Telephone Encounter (Signed)
Patient called requesting refill of Tramadol and Olapatadine.  Will route refill request to PCP.  Burna Forts, BSN, RN-BC

## 2017-06-04 ENCOUNTER — Telehealth: Payer: Self-pay | Admitting: *Deleted

## 2017-06-04 ENCOUNTER — Other Ambulatory Visit: Payer: Self-pay | Admitting: Family Medicine

## 2017-06-04 MED ORDER — OLOPATADINE HCL 0.6 % NA SOLN: 2.0000 [drp] | Freq: Two times a day (BID) | NASAL | 1 refills | Status: DC

## 2017-06-04 NOTE — Telephone Encounter (Signed)
Refill completed.

## 2017-06-04 NOTE — Telephone Encounter (Signed)
Message left on clinic nurse voice mail - needs nasal spray Rx sent to [facility] instead of local pharmacy because it is free.  Will route request to PCP.  Burna Forts, BSN, RN-BC

## 2017-07-30 ENCOUNTER — Other Ambulatory Visit: Payer: Self-pay | Admitting: Family Medicine

## 2017-07-30 DIAGNOSIS — Z1231 Encounter for screening mammogram for malignant neoplasm of breast: Secondary | ICD-10-CM

## 2017-08-07 ENCOUNTER — Other Ambulatory Visit: Payer: Self-pay | Admitting: *Deleted

## 2017-08-07 MED ORDER — VALACYCLOVIR HCL 500 MG PO TABS: 500.0000 mg | ORAL_TABLET | Freq: Every day | ORAL | 2 refills | Status: DC

## 2017-08-07 MED ORDER — GABAPENTIN 300 MG PO CAPS: 600.0000 mg | ORAL_CAPSULE | Freq: Every evening | ORAL | 2 refills | Status: DC | PRN

## 2017-08-07 MED ORDER — HYDROCHLOROTHIAZIDE 25 MG PO TABS: 25.0000 mg | ORAL_TABLET | Freq: Every day | ORAL | 2 refills | Status: DC

## 2017-08-07 MED ORDER — ESOMEPRAZOLE MAGNESIUM 20 MG PO CPDR: 20.0000 mg | DELAYED_RELEASE_CAPSULE | Freq: Every day | ORAL | 2 refills | Status: DC | PRN

## 2017-08-07 NOTE — Telephone Encounter (Signed)
Message left on clinic nurse voice mail - Patient needs refills on Nexium, gabapentin, pravastatin, HCTZ, and valcyclovir.   Pravastatin not on med list to order refill.  Will route refill request to PCP. Burna Forts, BSN, RN-BC

## 2017-08-08 DIAGNOSIS — I8312 Varicose veins of left lower extremity with inflammation: Secondary | ICD-10-CM | POA: Diagnosis not present

## 2017-08-08 DIAGNOSIS — I8311 Varicose veins of right lower extremity with inflammation: Secondary | ICD-10-CM | POA: Diagnosis not present

## 2017-08-09 MED ORDER — VALACYCLOVIR HCL 500 MG PO TABS: 500.0000 mg | ORAL_TABLET | Freq: Every day | ORAL | 2 refills | Status: DC

## 2017-08-09 MED ORDER — HYDROCHLOROTHIAZIDE 25 MG PO TABS: 25.0000 mg | ORAL_TABLET | Freq: Every day | ORAL | 2 refills | Status: DC

## 2017-08-09 MED ORDER — ESOMEPRAZOLE MAGNESIUM 20 MG PO CPDR: 20.0000 mg | DELAYED_RELEASE_CAPSULE | Freq: Every day | ORAL | 2 refills | Status: DC | PRN

## 2017-08-09 MED ORDER — GABAPENTIN 300 MG PO CAPS: 600.0000 mg | ORAL_CAPSULE | Freq: Every evening | ORAL | 2 refills | Status: DC | PRN

## 2017-08-09 NOTE — Telephone Encounter (Signed)
Patient left message on nurse line requesting all meds recently [facility] be sent to [facility]. States only uses [facility] for one time meds like antibiotic. All maintenance meds need to go to [place]. Hubbard Hartshorn, RN, BSN

## 2017-08-09 NOTE — Addendum Note (Signed)
Addended by: Andrena Mews T on: 08/09/2017 02:48 PM   Modules accepted: Orders

## 2017-08-21 ENCOUNTER — Ambulatory Visit
Admission: RE | Admit: 2017-08-21 | Discharge: 2017-08-21 | Disposition: A | Discharge status: Home / Self Care | Payer: Medicare Other | Source: Ambulatory Visit | Attending: Family Medicine | Admitting: Family Medicine

## 2017-08-21 DIAGNOSIS — Z1231 Encounter for screening mammogram for malignant neoplasm of breast: Secondary | ICD-10-CM

## 2017-09-04 ENCOUNTER — Other Ambulatory Visit: Payer: Self-pay

## 2017-09-04 NOTE — Telephone Encounter (Signed)
Patient left message on nurse line [facility] Trazadone, Nexium, Valcyclovir, Pravastatin Danley Danker, RN [facility] RN)

## 2017-09-05 ENCOUNTER — Telehealth: Payer: Self-pay | Admitting: Family Medicine

## 2017-09-05 ENCOUNTER — Other Ambulatory Visit: Payer: Self-pay | Admitting: Family Medicine

## 2017-09-05 MED ORDER — PRAVASTATIN SODIUM 20 MG PO TABS
20.0000 mg | ORAL_TABLET | ORAL | 2 refills | Status: DC
Start: 2017-09-05 — End: 2018-01-01

## 2017-09-05 MED ORDER — VALACYCLOVIR HCL 500 MG PO TABS: 500.0000 mg | ORAL_TABLET | Freq: Every day | ORAL | 2 refills | Status: DC

## 2017-09-05 MED ORDER — ESOMEPRAZOLE MAGNESIUM 20 MG PO CPDR: 20.0000 mg | DELAYED_RELEASE_CAPSULE | Freq: Every day | ORAL | 2 refills | Status: DC | PRN

## 2017-09-05 MED ORDER — TRAZODONE HCL 50 MG PO TABS: 25.0000 mg | ORAL_TABLET | Freq: Every day | ORAL | 2 refills | Status: DC

## 2017-09-05 NOTE — Telephone Encounter (Signed)
Unable to reach patient.  I need to clarify if she is still on Pravachol or not.  The last discussion I had with her was that she will try meds again and see if she is able to tolerate it. If not we will switch to Zetia.  If she calls back, please help clarify. In the meantime, I will not refill her Pravachol.

## 2017-09-05 NOTE — Telephone Encounter (Signed)
Patient returned call. States she takes the Pravachol every other day instead of daily and is tolerating this well without any muscle cramping. Please send refill to [facility] mail order. Danley Danker, RN [facility] Clinic RN)

## 2017-09-05 NOTE — Telephone Encounter (Signed)
Done

## 2017-10-09 DIAGNOSIS — K5730 Diverticulosis of large intestine without perforation or abscess without bleeding: Secondary | ICD-10-CM | POA: Diagnosis not present

## 2017-10-09 DIAGNOSIS — K219 Gastro-esophageal reflux disease without esophagitis: Secondary | ICD-10-CM | POA: Diagnosis not present

## 2017-10-22 ENCOUNTER — Other Ambulatory Visit: Payer: Self-pay | Admitting: Orthopedic Surgery

## 2017-10-22 DIAGNOSIS — M545 Low back pain: Secondary | ICD-10-CM | POA: Diagnosis not present

## 2017-10-22 DIAGNOSIS — M25551 Pain in right hip: Secondary | ICD-10-CM | POA: Diagnosis not present

## 2017-10-25 ENCOUNTER — Other Ambulatory Visit: Payer: Self-pay

## 2017-10-25 NOTE — Telephone Encounter (Signed)
Pt requesting refill of clobetasol sent to [facility]  Her call back [phone] Wallace Cullens, RN

## 2017-10-26 MED ORDER — CLOBETASOL PROPIONATE 0.05 % EX CREA: 1.0000 "application " | TOPICAL_CREAM | Freq: Two times a day (BID) | CUTANEOUS | 1 refills | Status: DC

## 2017-11-05 ENCOUNTER — Ambulatory Visit
Admission: RE | Admit: 2017-11-05 | Discharge: 2017-11-05 | Disposition: A | Discharge status: Home / Self Care | Payer: Medicare Other | Source: Ambulatory Visit | Attending: Orthopedic Surgery | Admitting: Orthopedic Surgery

## 2017-11-05 DIAGNOSIS — M545 Low back pain: Secondary | ICD-10-CM

## 2017-11-05 DIAGNOSIS — M48061 Spinal stenosis, lumbar region without neurogenic claudication: Secondary | ICD-10-CM | POA: Diagnosis not present

## 2017-12-03 DIAGNOSIS — M48061 Spinal stenosis, lumbar region without neurogenic claudication: Secondary | ICD-10-CM | POA: Diagnosis not present

## 2017-12-03 DIAGNOSIS — M4316 Spondylolisthesis, lumbar region: Secondary | ICD-10-CM | POA: Diagnosis not present

## 2017-12-03 DIAGNOSIS — Z6836 Body mass index (BMI) 36.0-36.9, adult: Secondary | ICD-10-CM | POA: Diagnosis not present

## 2017-12-03 DIAGNOSIS — M545 Low back pain: Secondary | ICD-10-CM | POA: Diagnosis not present

## 2017-12-03 DIAGNOSIS — M5416 Radiculopathy, lumbar region: Secondary | ICD-10-CM | POA: Diagnosis not present

## 2017-12-11 DIAGNOSIS — K1230 Oral mucositis (ulcerative), unspecified: Secondary | ICD-10-CM | POA: Diagnosis not present

## 2017-12-12 DIAGNOSIS — R2689 Other abnormalities of gait and mobility: Secondary | ICD-10-CM | POA: Diagnosis not present

## 2017-12-12 DIAGNOSIS — M79604 Pain in right leg: Secondary | ICD-10-CM | POA: Diagnosis not present

## 2017-12-12 DIAGNOSIS — M545 Low back pain: Secondary | ICD-10-CM | POA: Diagnosis not present

## 2017-12-18 DIAGNOSIS — M79604 Pain in right leg: Secondary | ICD-10-CM | POA: Diagnosis not present

## 2017-12-18 DIAGNOSIS — R2689 Other abnormalities of gait and mobility: Secondary | ICD-10-CM | POA: Diagnosis not present

## 2017-12-18 DIAGNOSIS — M545 Low back pain: Secondary | ICD-10-CM | POA: Diagnosis not present

## 2017-12-20 DIAGNOSIS — M79604 Pain in right leg: Secondary | ICD-10-CM | POA: Diagnosis not present

## 2017-12-20 DIAGNOSIS — M545 Low back pain: Secondary | ICD-10-CM | POA: Diagnosis not present

## 2017-12-20 DIAGNOSIS — R2689 Other abnormalities of gait and mobility: Secondary | ICD-10-CM | POA: Diagnosis not present

## 2017-12-25 DIAGNOSIS — R2689 Other abnormalities of gait and mobility: Secondary | ICD-10-CM | POA: Diagnosis not present

## 2017-12-25 DIAGNOSIS — M79604 Pain in right leg: Secondary | ICD-10-CM | POA: Diagnosis not present

## 2017-12-25 DIAGNOSIS — M545 Low back pain: Secondary | ICD-10-CM | POA: Diagnosis not present

## 2017-12-26 DIAGNOSIS — M79604 Pain in right leg: Secondary | ICD-10-CM | POA: Diagnosis not present

## 2017-12-26 DIAGNOSIS — R2689 Other abnormalities of gait and mobility: Secondary | ICD-10-CM | POA: Diagnosis not present

## 2017-12-26 DIAGNOSIS — M545 Low back pain: Secondary | ICD-10-CM | POA: Diagnosis not present

## 2018-01-01 ENCOUNTER — Other Ambulatory Visit: Payer: Self-pay

## 2018-01-01 MED ORDER — GABAPENTIN 300 MG PO CAPS: 600.0000 mg | ORAL_CAPSULE | Freq: Every evening | ORAL | 2 refills | Status: DC | PRN

## 2018-01-01 MED ORDER — SUMATRIPTAN SUCCINATE 25 MG PO TABS: ORAL_TABLET | ORAL | 2 refills | Status: DC

## 2018-01-01 MED ORDER — PRAVASTATIN SODIUM 20 MG PO TABS: 20.0000 mg | ORAL_TABLET | ORAL | 2 refills | Status: DC

## 2018-01-01 MED ORDER — ESOMEPRAZOLE MAGNESIUM 20 MG PO CPDR: 20.0000 mg | DELAYED_RELEASE_CAPSULE | Freq: Every day | ORAL | 2 refills | Status: DC | PRN

## 2018-01-03 DIAGNOSIS — R2689 Other abnormalities of gait and mobility: Secondary | ICD-10-CM | POA: Diagnosis not present

## 2018-01-03 DIAGNOSIS — M545 Low back pain: Secondary | ICD-10-CM | POA: Diagnosis not present

## 2018-01-03 DIAGNOSIS — M79604 Pain in right leg: Secondary | ICD-10-CM | POA: Diagnosis not present

## 2018-01-04 DIAGNOSIS — M545 Low back pain: Secondary | ICD-10-CM | POA: Diagnosis not present

## 2018-01-04 DIAGNOSIS — M79604 Pain in right leg: Secondary | ICD-10-CM | POA: Diagnosis not present

## 2018-01-04 DIAGNOSIS — R2689 Other abnormalities of gait and mobility: Secondary | ICD-10-CM | POA: Diagnosis not present

## 2018-01-09 DIAGNOSIS — M79604 Pain in right leg: Secondary | ICD-10-CM | POA: Diagnosis not present

## 2018-01-09 DIAGNOSIS — M545 Low back pain: Secondary | ICD-10-CM | POA: Diagnosis not present

## 2018-01-09 DIAGNOSIS — R2689 Other abnormalities of gait and mobility: Secondary | ICD-10-CM | POA: Diagnosis not present

## 2018-01-15 DIAGNOSIS — M545 Low back pain: Secondary | ICD-10-CM | POA: Diagnosis not present

## 2018-01-15 DIAGNOSIS — R2689 Other abnormalities of gait and mobility: Secondary | ICD-10-CM | POA: Diagnosis not present

## 2018-01-15 DIAGNOSIS — M79604 Pain in right leg: Secondary | ICD-10-CM | POA: Diagnosis not present

## 2018-01-17 DIAGNOSIS — R2689 Other abnormalities of gait and mobility: Secondary | ICD-10-CM | POA: Diagnosis not present

## 2018-01-17 DIAGNOSIS — M545 Low back pain: Secondary | ICD-10-CM | POA: Diagnosis not present

## 2018-01-17 DIAGNOSIS — M79604 Pain in right leg: Secondary | ICD-10-CM | POA: Diagnosis not present

## 2018-02-18 DIAGNOSIS — I10 Essential (primary) hypertension: Secondary | ICD-10-CM | POA: Diagnosis not present

## 2018-02-18 DIAGNOSIS — M48061 Spinal stenosis, lumbar region without neurogenic claudication: Secondary | ICD-10-CM | POA: Diagnosis not present

## 2018-02-18 DIAGNOSIS — M545 Low back pain: Secondary | ICD-10-CM | POA: Diagnosis not present

## 2018-02-18 DIAGNOSIS — M4316 Spondylolisthesis, lumbar region: Secondary | ICD-10-CM | POA: Diagnosis not present

## 2018-02-18 DIAGNOSIS — Z6836 Body mass index (BMI) 36.0-36.9, adult: Secondary | ICD-10-CM | POA: Diagnosis not present

## 2018-02-18 DIAGNOSIS — M5416 Radiculopathy, lumbar region: Secondary | ICD-10-CM | POA: Diagnosis not present

## 2018-05-01 ENCOUNTER — Telehealth: Payer: Self-pay | Admitting: Family Medicine

## 2018-05-01 NOTE — Telephone Encounter (Signed)
Left general message for pt in regards to wellness check up.

## 2018-05-20 DIAGNOSIS — M545 Low back pain: Secondary | ICD-10-CM | POA: Diagnosis not present

## 2018-05-20 DIAGNOSIS — M5416 Radiculopathy, lumbar region: Secondary | ICD-10-CM | POA: Diagnosis not present

## 2018-05-20 DIAGNOSIS — M4316 Spondylolisthesis, lumbar region: Secondary | ICD-10-CM | POA: Diagnosis not present

## 2018-05-20 DIAGNOSIS — M48061 Spinal stenosis, lumbar region without neurogenic claudication: Secondary | ICD-10-CM | POA: Diagnosis not present

## 2018-05-20 DIAGNOSIS — Z6836 Body mass index (BMI) 36.0-36.9, adult: Secondary | ICD-10-CM | POA: Diagnosis not present

## 2018-05-21 ENCOUNTER — Other Ambulatory Visit: Payer: Self-pay | Admitting: Neurosurgery

## 2018-05-22 ENCOUNTER — Encounter: Payer: Self-pay | Admitting: Family Medicine

## 2018-05-22 ENCOUNTER — Other Ambulatory Visit: Payer: Self-pay

## 2018-05-22 ENCOUNTER — Ambulatory Visit (INDEPENDENT_AMBULATORY_CARE_PROVIDER_SITE_OTHER): Payer: Medicare Other | Admitting: Family Medicine

## 2018-05-22 VITALS — BP 119/80 | HR 87 | Temp 97.8°F | Wt 204.0 lb

## 2018-05-22 DIAGNOSIS — R5383 Other fatigue: Secondary | ICD-10-CM

## 2018-05-22 DIAGNOSIS — R7309 Other abnormal glucose: Secondary | ICD-10-CM

## 2018-05-22 DIAGNOSIS — J302 Other seasonal allergic rhinitis: Secondary | ICD-10-CM | POA: Diagnosis not present

## 2018-05-22 DIAGNOSIS — Z23 Encounter for immunization: Secondary | ICD-10-CM | POA: Diagnosis not present

## 2018-05-22 DIAGNOSIS — R7303 Prediabetes: Secondary | ICD-10-CM | POA: Diagnosis not present

## 2018-05-22 DIAGNOSIS — I10 Essential (primary) hypertension: Secondary | ICD-10-CM

## 2018-05-22 DIAGNOSIS — E785 Hyperlipidemia, unspecified: Secondary | ICD-10-CM

## 2018-05-22 DIAGNOSIS — N951 Menopausal and female climacteric states: Secondary | ICD-10-CM

## 2018-05-22 LAB — POCT GLYCOSYLATED HEMOGLOBIN (HGB A1C): HbA1c, POC (controlled diabetic range): 6 % (ref 0.0–7.0)

## 2018-05-22 MED ORDER — GABAPENTIN 300 MG PO CAPS: 300.0000 mg | ORAL_CAPSULE | Freq: Three times a day (TID) | ORAL | 1 refills | Status: DC

## 2018-05-22 MED ORDER — PRAVASTATIN SODIUM 20 MG PO TABS: 20.0000 mg | ORAL_TABLET | ORAL | 1 refills | Status: DC

## 2018-05-22 MED ORDER — HYDROCHLOROTHIAZIDE 25 MG PO TABS: 25.0000 mg | ORAL_TABLET | Freq: Every day | ORAL | 1 refills | Status: DC

## 2018-05-22 MED ORDER — ESOMEPRAZOLE MAGNESIUM 20 MG PO CPDR: 20.0000 mg | DELAYED_RELEASE_CAPSULE | Freq: Every day | ORAL | 1 refills | Status: DC | PRN

## 2018-05-22 MED ORDER — SUMATRIPTAN SUCCINATE 25 MG PO TABS: ORAL_TABLET | ORAL | 2 refills | Status: DC

## 2018-05-22 MED ORDER — VALACYCLOVIR HCL 500 MG PO TABS: 500.0000 mg | ORAL_TABLET | Freq: Every day | ORAL | 1 refills | Status: DC

## 2018-05-22 MED ORDER — MONTELUKAST SODIUM 10 MG PO TABS: 10.0000 mg | ORAL_TABLET | Freq: Every day | ORAL | 3 refills | Status: DC

## 2018-05-22 NOTE — Assessment & Plan Note (Signed)
A1C increased but still within appropriate range. Continue diet and exercise.

## 2018-05-22 NOTE — Assessment & Plan Note (Signed)
She had tried Claritin in the past without improvement and it gave her dry mouth. Trial of Singulair recommended.

## 2018-05-22 NOTE — Patient Instructions (Signed)

## 2018-05-22 NOTE — Progress Notes (Addendum)
  Subjective:     Patient ID: Angie [name], female   DOB: 12/24/1945, 72 y.o.   MRN: [id number]  HPI HTN/HLD:Compliant with meds. Here for f/u. PreDM: Here for f/u. On diet management. Hot flashes: Gabapentin 600 mg qhs is not helping a lot. Fatigue:Feels tired in the last few weeks. She will like to check for anemia Allergy symptoms: with early morning cough and congestion.   Current Outpatient Medications on File Prior to Visit  Medication Sig Dispense Refill  . aspirin EC 81 MG tablet Take 81 mg by mouth daily.    . clobetasol cream (TEMOVATE) 3.35 % Apply 1 application topically 2 (two) times daily. 60 g 1  . Elastic Bandages & Supports (FUTURO FIRM COMPRESSION HOSE) MISC Ted Hose, medium compression     . esomeprazole (NEXIUM) 20 MG capsule Take 1 capsule (20 mg total) by mouth daily as needed. 90 capsule 2  . gabapentin (NEURONTIN) 300 MG capsule Take 2 capsules (600 mg total) by mouth at bedtime as needed (Hot flash). 180 capsule 2  . hydrochlorothiazide (HYDRODIURIL) 25 MG tablet Take 1 tablet (25 mg total) by mouth daily. 90 tablet 2  . Multiple Vitamin (MULTIVITAMIN) capsule Take 1 capsule by mouth daily.    . Olopatadine HCl 0.6 % SOLN Place 2 drops 2 (two) times daily into the nose. 3 Bottle 1  . pravastatin (PRAVACHOL) 20 MG tablet Take 1 tablet (20 mg total) by mouth every other day. 45 tablet 2  . SUMAtriptan (IMITREX) 25 MG tablet take one tablet at start of headache. May repeat in 2 hours x 1 if needed 15 tablet 2  . traZODone (DESYREL) 50 MG tablet Take 0.5 tablets (25 mg total) by mouth at bedtime. 45 tablet 2  . valACYclovir (VALTREX) 500 MG tablet Take 1 tablet (500 mg total) by mouth daily. 90 tablet 2   No current facility-administered medications on file prior to visit.    Past Medical History:  Diagnosis Date  . Chronic back pain   . Diverticulosis   . Herpes zoster   . Hyperlipidemia   . Hypertension   . Migraine   . Palpitations 09/08/2014  . 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  Never received treatment.  No signs of active disease and low risk for reactivation.  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  Patient declines referral to health department to discuss at this time.      Review of Systems  Constitutional: Positive for fatigue.  Respiratory: Negative.   Cardiovascular: Negative.   Gastrointestinal: Negative.   Genitourinary: Negative.   Neurological: Negative for dizziness and light-headedness.  All other systems reviewed and are negative.      Objective:   Physical Exam  Constitutional: She appears well-developed. No distress.  Cardiovascular: Normal rate, regular rhythm and normal heart sounds.  Grade 1 systolic murmur  Pulmonary/Chest: Effort normal. No stridor. No respiratory distress. She has no wheezes.  Abdominal: Soft. Bowel sounds are normal. She exhibits no distension and no mass. There is no tenderness. There is no guarding.  Neurological: She is alert. She displays normal reflexes. No cranial nerve deficit. Coordination normal.  Nursing note and vitals reviewed.      Assessment:     HTN HLD PreDM Hot Flashes Fatigue Seasonal allergy    Plan:     Check problem list

## 2018-05-22 NOTE — Assessment & Plan Note (Addendum)
BP looks good.  Continue current regimen. Bmet checked.

## 2018-05-22 NOTE — Assessment & Plan Note (Signed)
No depression. TSH and CBC checked. Graded exercise recommended.

## 2018-05-22 NOTE — Assessment & Plan Note (Signed)
Compliant with meds. Checked FLP today.

## 2018-05-22 NOTE — Assessment & Plan Note (Signed)
Switch to Gabapentin 300 mg TID from 600mg  QHS.

## 2018-05-23 ENCOUNTER — Telehealth: Payer: Self-pay | Admitting: Family Medicine

## 2018-05-23 LAB — BASIC METABOLIC PANEL
BUN/Creatinine Ratio: 23 (ref 12–28)
BUN: 18 mg/dL (ref 8–27)
CO2: 27 mmol/L (ref 20–29)
Calcium: 10.3 mg/dL (ref 8.7–10.3)
Chloride: 98 mmol/L (ref 96–106)
Creatinine, Ser: 0.79 mg/dL (ref 0.57–1.00)
GFR calc Af Amer: 87 mL/min/{1.73_m2} (ref >59)
GFR calc non Af Amer: 76 mL/min/{1.73_m2} (ref >59)
Glucose: 100 mg/dL — ABNORMAL HIGH (ref 65–99)
Potassium: 4.1 mmol/L (ref 3.5–5.2)
Sodium: 140 mmol/L (ref 134–144)

## 2018-05-23 LAB — CBC WITH DIFFERENTIAL/PLATELET
Basophils Absolute: 0 10*3/uL (ref 0.0–0.2)
Basos: 1 %
EOS (ABSOLUTE): 0.1 10*3/uL (ref 0.0–0.4)
Eos: 1 %
Hematocrit: 42.5 % (ref 34.0–46.6)
Hemoglobin: 14 g/dL (ref 11.1–15.9)
Immature Grans (Abs): 0 10*3/uL (ref 0.0–0.1)
Immature Granulocytes: 0 %
Lymphocytes Absolute: 2.3 10*3/uL (ref 0.7–3.1)
Lymphs: 31 %
MCH: 29.5 pg (ref 26.6–33.0)
MCHC: 32.9 g/dL (ref 31.5–35.7)
MCV: 90 fL (ref 79–97)
Monocytes Absolute: 0.6 10*3/uL (ref 0.1–0.9)
Monocytes: 8 %
Neutrophils Absolute: 4.5 10*3/uL (ref 1.4–7.0)
Neutrophils: 59 %
Platelets: 242 10*3/uL (ref 150–450)
RBC: 4.75 x10E6/uL (ref 3.77–5.28)
RDW: 13.5 % (ref 12.3–15.4)
WBC: 7.5 10*3/uL (ref 3.4–10.8)

## 2018-05-23 LAB — LIPID PANEL
Chol/HDL Ratio: 3.3 ratio (ref 0.0–4.4)
Cholesterol, Total: 211 mg/dL — ABNORMAL HIGH (ref 100–199)
HDL: 63 mg/dL (ref >39)
LDL Calculated: 128 mg/dL — ABNORMAL HIGH (ref 0–99)
Triglycerides: 99 mg/dL (ref 0–149)
VLDL Cholesterol Cal: 20 mg/dL (ref 5–40)

## 2018-05-23 LAB — TSH: TSH: 1.71 u[IU]/mL (ref 0.450–4.500)

## 2018-05-23 NOTE — Telephone Encounter (Signed)
Test result discussed. No change in medication. Graded exercise recommended for fatigue. F/U soon.

## 2018-05-29 ENCOUNTER — Encounter: Payer: Self-pay | Admitting: Family Medicine

## 2018-06-19 ENCOUNTER — Telehealth: Payer: Self-pay

## 2018-06-19 NOTE — Telephone Encounter (Signed)
Patient left message asking for refill on Tramadol to [facility]. Not on current med list.  Danley Danker, RN [facility] Clinic RN)

## 2018-06-19 NOTE — Telephone Encounter (Signed)
Please advise her that we have not been prescribing Tramadol to her. Can she tell who recently prescribe medication to her and for what reason. This is a controlled medication that requires appropriate evaluation and documentation prior to prescribing it.

## 2018-06-19 NOTE — Telephone Encounter (Signed)
Called pt. Informed her of message from Dr. Gwendlyn Deutscher. Pt stated that Dr. Almedia Balls Rx her Tramadol for her back pain. She said that it would be fine, she would just go back to her for the Tramadol. Salvatore Marvel, CMA

## 2018-07-30 DIAGNOSIS — M1611 Unilateral primary osteoarthritis, right hip: Secondary | ICD-10-CM | POA: Diagnosis not present

## 2018-07-30 NOTE — H&P (Signed)
Patient ID:   [id number] Patient: Angie [name]  Date of Birth: 11-18-1945 Visit Type: Office Visit   Date: 05/20/2018 11:00 AM Provider: Marchia Meiers. Vertell Limber MD   This 73 year old female presents for back pain and Bilateral pain legs.  HISTORY OF PRESENT ILLNESS: 1.  back pain  2.  Bilateral pain legs  Patient returns as scheduled.  She notes some pain with bending or lifting her leg to tie shoes.  She notes heaviness in her low back some mornings.  Most pain symptoms follow activity. She endorses that in general she is in less pain. However, she stills notes some frustration with her pain. Patient is unhappy with her current weight but has made some progress in reducing her weight since her last visit. Tramadol is taken only as needed, not daily      Medical/Surgical/Interim History Reviewed, no change.  Last detailed document date:12/03/2017.     Family History: Reviewed, no changes.  Last detailed document date:12/03/2017.   Social History: Reviewed, no changes. Last detailed document date: 12/03/2017.    MEDICATIONS: (added, continued or stopped this visit) Started Medication Directions Instruction Stopped  Aspirin Low Dose 81 mg tablet,delayed release take 1 tablet by oral route  every day    hydrochlorothiazide 25 mg tablet take 1 tablet by oral route  every day    Imitrex 25 mg tablet take 1 tablet by oral route after onset of migraine; may repeat after 2 hours if headache returns,not to exceed 200mg  in 24hrs    multivitamin tablet take 1 tablet by oral route  every day    Nexium 20 mg capsule,delayed release take 1 capsule by oral route  every day at least 1 hour before a meal swallowing whole. Do not crush or chew granules.    pravastatin 20 mg tablet take 1 tablet by oral route  every day    tramadol 50 mg tablet take 1 tablet by oral route  every 6 hours as needed    trazodone 300 mg tablet take 2 tablet by oral route  every  day with food    valacyclovir 500 mg tablet take 1 tablet by oral route  every day      ALLERGIES: Ingredient Reaction Medication Name Comment NO KNOWN ALLERGIES    No known allergies.    PHYSICAL EXAM:  Vitals Date Temp F BP Pulse Ht In Wt Lb BMI BSA Pain Score 05/20/2018  117/73 75 63 206 36.49  5/10     IMPRESSION:  L-spine MRI without contrast reveals spondylolisthesis and facet arthropathy of L4-5, spondylolisthesis at L5-S1. Discussed what surgical intervention would entail - discussed recovery. Patient feels that she is somewhat better but she is not good enough. Recommended surgical intervention - MAS PLIF L4-5, L5-S1.  Upon examination, 4+/5 left EHL, 4 to 4-/5 left hip abductor, full strength right lower extremity, negative SLR bilaterally.  PLAN: 1. MAS PLIF L4-5, L5-S1 scheduled 2. Nurse education given 3. LSO brace fitted 4. Follow-up after surgery  Orders: Instruction(s)/Education: Assessment Instruction [phone] Lifestyle education regarding diet  Completed Orders (this encounter) Order Details Reason Side Interpretation Result Initial Treatment Date Region Lifestyle education regarding diet Patient encourage to eat a well balance diet        Assessment/Plan  # Detail Type Description  1. Assessment Body mass index (BMI) 36.0-36.9, adult (Z68.36).  Plan Orders Today's instructions / counseling include(s) Lifestyle education regarding diet. Clinical information/comments: Patient encourage to eat a well balance diet.  Pain Management Plan Pain Scale: 5/10. Method: Numeric Pain Intensity Scale. Location: back. Onset: 09/05/2017. Duration: varies. Quality: discomforting. Pain management follow-up plan of care: Patient taken medication as prescribed.  Fall Risk Plan The patient has not fallen in the last year.              Provider:  Marchia Meiers. Vertell Limber MD  05/20/2018 12:25 PM Dictation  edited by: Mirian Mo    CC Providers: PCP  None   [facility] [address] [place], [place] [address]               Electronically signed by Marchia Meiers. Vertell Limber MD on 05/25/2018 01:09 PM      Patient ID:   [id number] Patient: Angie [name]  Date of Birth: 1945/11/19 Visit Type: Office Visit   Date: 02/18/2018 11:00 AM Provider: Marchia Meiers. Vertell Limber MD   This 73 year old female presents for back pain.  HISTORY OF PRESENT ILLNESS: 1.  back pain  Patient returns as scheduled.  She participated in both land based and water therapies.  She reports significant benefit from each.  Water therapy continues.  Tramadol 50 mg taken only as needed  The patient says that she is 50% better with therapy.  She is pleased with her exercise program and is going to work hard on losing weight.  She is going to continue her exercises.      Medical/Surgical/Interim History Reviewed, no change.  Last detailed document date:12/03/2017.     PAST MEDICAL HISTORY, SURGICAL HISTORY, FAMILY HISTORY, SOCIAL HISTORY AND REVIEW OF SYSTEMS I have reviewed the patient's past medical, surgical, family and social history as well as the comprehensive review of systems as included on the [facility] history form dated 12/03/2017, which I have signed.  Family History: Reviewed, no changes.  Last detailed document date:12/03/2017.   Social History: Reviewed, no changes. Last detailed document date: 12/03/2017.    MEDICATIONS: (added, continued or stopped this visit) Started Medication Directions Instruction Stopped  Aspirin Low Dose 81 mg tablet,delayed release take 1 tablet by oral route  every day    hydrochlorothiazide 25 mg tablet take 1 tablet by oral route  every day    Imitrex 25 mg tablet take 1 tablet by oral route after onset of migraine; may repeat after 2 hours if headache  returns,not to exceed 200mg  in 24hrs    multivitamin tablet take 1 tablet by oral route  every day    Nexium 20 mg capsule,delayed release take 1 capsule by oral route  every day at least 1 hour before a meal swallowing whole. Do not crush or chew granules.    pravastatin 20 mg tablet take 1 tablet by oral route  every day    tramadol 50 mg tablet take 1 tablet by oral route  every 6 hours as needed    trazodone 300 mg tablet take 2 tablet by oral route  every day with food    valacyclovir 500 mg tablet take 1 tablet by oral route  every day      ALLERGIES: Ingredient Reaction Medication Name Comment NO KNOWN ALLERGIES    No known allergies.    PHYSICAL EXAM:  Vitals Date Temp F BP Pulse Ht In Wt Lb BMI BSA Pain Score 02/18/2018  130/76 73 63 207 36.67  2/10     IMPRESSION:  The patient is making good progress.  I have encouraged her  to continue conservative management at home.  She is going to work hard on weight loss.  PLAN: Patient will follow-up with me in 3-4 months for recheck.  If she is doing well that point I will likely release her and she will follow-up with me after that on an as-needed basis.  Orders: Instruction(s)/Education: Assessment Instruction I10 Hypertension education [phone] Lifestyle education regarding diet  Completed Orders (this encounter) Order Details Reason Side Interpretation Result Initial Treatment Date Region Lifestyle education regarding diet Patient encouraged to eat a well balanced diet.       Hypertension education Patient to follow up with primary care provider.        Assessment/Plan  # Detail Type Description  1. Assessment Low back pain, unspecified back pain laterality, with sciatica presence unspecified (M54.5).     2. Assessment Degenerative lumbar spinal stenosis (M48.061).     3. Assessment Lumbar radiculopathy (M54.16).      4. Assessment Spondylolisthesis, lumbar region (M43.16).     5. Assessment Body mass index (BMI) 36.0-36.9, adult (Z68.36).  Plan Orders Today's instructions / counseling include(s) Lifestyle education regarding diet. Clinical information/comments: Patient encouraged to eat a well balanced diet.     6. Assessment Essential (primary) hypertension (I10).       Pain Management Plan Pain Scale: 2/10. Method: Numeric Pain Intensity Scale. Location: back. Onset: 09/05/2017. Duration: varies. Quality: discomforting. Pain management follow-up plan of care: Patient taking medication as prescribed..              Provider:  Marchia Meiers. Vertell Limber MD  02/24/2018 01:31 PM Dictation edited by: Marchia Meiers. Vertell Limber    CC Providers: PCP  None   [facility] [address] [place], [place] [address]               Electronically signed by Marchia Meiers Vertell Limber MD on 02/24/2018 01:31 PM     Patient ID:   [id number] Patient: Angie [name]  Date of Birth: 06-27-1946 Visit Type: Office Visit   Date: 12/03/2017 08:30 AM Provider: Marchia Meiers. Vertell Limber MD   This 73 year old female presents for back pain.  HISTORY OF PRESENT ILLNESS: 1.  back pain  Dierdre Searles, 73 year old retired female, visits for evaluation.  She reports low back and left greater than right leg pain increasing over the past year.  She notes some weakness in her right leg on stairs.  She recalls no injury.  Tramadol 50 mg taken rarely  ESI s years ago offered no lasting relief Physical therapy in the past offered no lasting relief  History:  HTN, GERD, vertigo, migraines, herpes Surgical history:  Tubal ligation 1982, cholecystectomy 1983, bilateral rotator cuff repair 2009, bilateral carpal tunnel release 2012  MRI and x-rays on [facility]  (currently MRI listed under hyphenated name of Tashawna Reid-White)  Pt reports worsening pain in lower  back, used to get relief upon sitting but is no longer helping. Pain is 5/10. Pain is worse in back than legs, with left worse than right leg pain, with only one incidence of weakness. Pt is advised to loose weight, noting a constant struggle with weight management. Pt notes exercising doing step aerobics, occasional water aerobics. MRI shows spondylolisthesis of 30mm at L4-5, and 57mm at L5-S1 with associated disk degeneration. Lumbar x-rays shows severe arthritis at L4-5 and L5-S1, exaggerated lumbar curvature, and dynamic spondylolisthesis measuring: 11.5 mm neutral L4-5, 88mm neutral L5-S1, 13 mm flexion L4-5, 7.5 mm flexion  L5-S1, 8.5 mm extension L4-5, 64mm extension L5-S1. Pt can reach her toes. Decreased L5 pin sensation on the left. Negative SLR, negative Patrick's. Pt reports some relief from ESI s from 6 years ago, but notes she dislikes repetitive treatment also notes relief from drinking cherry juice. Pt is recommended physical therapy, aquatic therapy, possible ESIs.        PAST MEDICAL/SURGICAL HISTORY   (Detailed)  Disease/disorder Onset Date Management Date Comments   bilateral rotator cuff repair 2009    tubal ligation 1982  Vertigo     herpes     Hypertension       Carpal tunnel release     Cholecystectomy   Arthritis     Elevated lipids     Gerd     Headache, migraine        PAST MEDICAL HISTORY, SURGICAL HISTORY, FAMILY HISTORY, SOCIAL HISTORY AND REVIEW OF SYSTEMS I have reviewed the patient's past medical, surgical, family and social history as well as the comprehensive review of systems as included on the [facility] history form dated 11/30/2017, which I have signed.  Family History:  (Detailed) Relationship Family Member Name Deceased Age at Death Condition Onset Age Cause of Death     Family history of Myocardial infarction  N     Family history of Diabetes  mellitus  N     Family history of Stroke  N    Social History:  (Detailed) Tobacco use reviewed. Preferred language is [place].   Tobacco use status: Current non-smoker. Smoking status: Former smoker.  SMOKING STATUS Type Smoking Status Usage Per Day Years Used Total Pack Years  Former smoker         MEDICATIONS: (added, continued or stopped this visit) Started Medication Directions Instruction Stopped  Aspirin Low Dose 81 mg tablet,delayed release take 1 tablet by oral route  every day    hydrochlorothiazide 25 mg tablet take 1 tablet by oral route  every day    Imitrex 25 mg tablet take 1 tablet by oral route after onset of migraine; may repeat after 2 hours if headache returns,not to exceed 200mg  in 24hrs    multivitamin tablet take 1 tablet by oral route  every day    Nexium 20 mg capsule,delayed release take 1 capsule by oral route  every day at least 1 hour before a meal swallowing whole. Do not crush or chew granules.    pravastatin 20 mg tablet take 1 tablet by oral route  every day    tramadol 50 mg tablet take 1 tablet by oral route  every 6 hours as needed    trazodone 300 mg tablet take 2 tablet by oral route  every day with food    valacyclovir 500 mg tablet take 1 tablet by oral route  every day      ALLERGIES: Ingredient Reaction Medication Name Comment NO KNOWN ALLERGIES    No known allergies. Reviewed, no changes.   REVIEW OF SYSTEMS  See scanned patient registration form, dated 11/30/2017, signed and dated on 12/03/2017  Review of Systems Details System Neg/Pos Details Constitutional Negative Chills, Fatigue, Fever, Malaise, Night sweats, Weight gain and Weight loss. ENMT Negative Ear drainage, Hearing loss, Nasal drainage, Otalgia, Sinus pressure and Sore throat. Eyes Negative Eye discharge, Eye pain and Vision changes. Respiratory Negative Chronic cough, Cough, Dyspnea, Known  TB exposure and Wheezing. Cardio Negative Chest pain, Claudication, Edema and Irregular heartbeat/palpitations. GI Negative Abdominal pain, Blood in stool, Change in  stool pattern, Constipation, Decreased appetite, Diarrhea, Heartburn, Nausea and Vomiting. GU Negative Dysuria, Hematuria, Polyuria (Genitourinary), Urinary frequency, Urinary incontinence and Urinary retention. Endocrine Negative Cold intolerance, Heat intolerance, Polydipsia and Polyphagia. Neuro Negative Dizziness, Extremity weakness, Gait disturbance, Headache, Memory impairment, Numbness in extremity, Seizures and Tremors. Psych Negative Anxiety, Depression and Insomnia. Integumentary Negative Brittle hair, Brittle nails, Change in shape/size of mole(s), Hair loss, Hirsutism, Hives, Pruritus, Rash and Skin lesion. MS Positive Back pain. Hema/Lymph Negative Easy bleeding, Easy bruising and Lymphadenopathy. Allergic/Immuno Negative Contact allergy, Environmental allergies, Food allergies and Seasonal allergies. Reproductive Negative Breast discharge, Breast lumps, Dysmenorrhea, Dyspareunia, History of abnormal PAP smear, Hot flashes, Irregular menses and Vaginal discharge.  PHYSICAL EXAM:  Vitals Date Temp F BP Pulse Ht In Wt Lb BMI BSA Pain Score 12/03/2017  115/75 76 63 206 36.49  0/10   PHYSICAL EXAM Details General Level of Distress: no acute distress Overall Appearance: normal  Head and Face  Right Left  Fundoscopic Exam:  normal normal    Cardiovascular Cardiac: regular rate and rhythm without murmur  Right Left  Carotid Pulses: normal normal  Respiratory Lungs: clear to auscultation  Neurological Orientation: normal Recent and Remote Memory: normal Attention Span and Concentration:   normal Language: normal Fund of Knowledge: normal  Right Left Sensation: normal normal Upper Extremity Coordination: normal normal  Lower Extremity  Coordination: normal normal  Musculoskeletal Gait and Station: normal  Right Left Upper Extremity Muscle Strength: normal normal Lower Extremity Muscle Strength: normal normal Upper Extremity Muscle Tone:  normal normal Lower Extremity Muscle Tone: normal normal   Motor Strength Upper and lower extremity motor strength was tested in the clinically pertinent muscles. Any abnormal findings will be noted below.   Right Left EHL:  4+/5   Deep Tendon Reflexes  Right Left Biceps: normal normal Triceps: normal normal Brachioradialis: normal normal Patellar: normal normal Achilles: normal normal  Cranial Nerves II. Optic Nerve/Visual Fields: normal III. Oculomotor: normal IV. Trochlear: normal V. Trigeminal: normal VI. Abducens: normal VII. Facial: normal VIII. Acoustic/Vestibular: normal IX. Glossopharyngeal: normal X. Vagus: normal XI. Spinal Accessory: normal XII. Hypoglossal: normal  Motor and other Tests Lhermittes: negative Rhomberg: negative Pronator drift: absent     Right Left Hoffman's: normal normal Clonus: normal normal Babinski: normal normal SLR: negative negative Patrick's Corky Sox): negative negative   Additional Findings:  4-/5 hip abductor    IMPRESSION:  Pt reports worsening pain in lower back, used to get relief upon sitting but is no longer helping. Pain is 5/10. Pain is worse in back than legs, with left worse than right leg pain, with only one incidence of weakness. Pt is advised to loose weight, noting a constant struggle with weight management. Pt notes exercising doing step aerobics, occasional water aerobics. MRI shows spondylolisthesis of 68mm at L4-5, and 27mm at L5-S1 with associated disk degeneration. Lumbar x-rays shows severe arthritis at L4-5 and L5-S1, exaggerated lumbar curvature, and dynamic spondylolisthesis measuring: 11.5 mm neutral L4-5, 41mm neutral L5-S1, 13 mm flexion L4-5, 7.5 mm flexion L5-S1, 8.5 mm extension L4-5, 26mm  extension L5-S1. Pt can reach her toes. Decreased L5 pin sensation on the left. Negative SLR, negative Patrick's. Pt reports some relief from ESI s from 6 years ago, but notes she dislikes repetitive treatment also notes relief from drinking cherry juice. Pt is recommended physical therapy, aquatic therapy, possible ESI (left L 45).  PLAN: Pt is given rx for physical therapy and aquatic therapy. Return in 2 months.  Orders: Diagnostic Procedures:  Assessment Procedure M43.16 Lumbar Spine- AP/Lat/Flex/Ex Instruction(s)/Education: Assessment Instruction Z68.36 Dietary management education, guidance, and counseling  Completed Orders (this encounter) Order Details Reason Side Interpretation Result Initial Treatment Date Region Lumbar Spine- AP/Lat/Flex/Ex      12/03/2017 All Levels to All Levels Dietary management education, guidance, and counseling patient encouraged to eat a well balanced diet        Assessment/Plan  # Detail Type Description  1. Assessment Low back pain, unspecified back pain laterality, with sciatica presence unspecified (M54.5).     2. Assessment Degenerative lumbar spinal stenosis (M48.061).     3. Assessment Spondylolisthesis, lumbar region (M43.16).     4. Assessment Lumbar radiculopathy (M54.16).     5. Assessment Body mass index (BMI) 36.0-36.9, adult (Z68.36).  Plan Orders Today's instructions / counseling include(s) Dietary management education, guidance, and counseling.       Pain Management Plan Pain Scale: 0/10. Method: Numeric Pain Intensity Scale. Onset: 09/05/2017.              Provider:  Vertell Limber MD, Marchia Meiers 12/04/2017 3:08 PM Dictation edited by: Marchia Meiers. Vertell Limber    CC Providers: [facility] [address] [place], [place] [address]              Electronically signed by Marchia Meiers. Vertell Limber MD on 12/04/2017 03:09 PM

## 2018-08-01 NOTE — Pre-Procedure Instructions (Signed)
Angie [name]  08/01/2018      [id number] - [place] [address] [place] [address] Phone: [phone] Fax: [phone]  [facility] [place] [place] [address] Phone: [phone] Fax: [phone]    Your procedure is scheduled on Jan. 16  Report to [facility] Admitting at 5:30 A.M.  Call this number if you have problems the morning of surgery:  [phone]   Remember:  Do not eat or drink after midnight.      Take these medicines the morning of surgery with A SIP OF WATER :              Esomeprazole (nexium) if needed             Gabapentin (neurontin)             Sumatriptan (imitrex) if needed             Valacyclovir (valtrex)             7 days prior to surgery STOP taking any Aspirin (unless otherwise instructed by your surgeon), Aleve, Naproxen, Ibuprofen, Motrin, Advil, Goody's, BC's, all herbal medications, fish oil, and all vitamins.                Follow your surgeon's instructions on when to stop Asprin.  If no instructions were given by your surgeon then you will need to call the office to get those instructions.        Do not wear jewelry, make-up or nail polish.  Do not wear lotions, powders, or perfumes, or deodorant.  Do not shave 48 hours prior to surgery.  Men may shave face and neck.  Do not bring valuables to the hospital.  [facility] is not responsible for any belongings or valuables.  Contacts, dentures or bridgework may not be worn into surgery.  Leave your suitcase in the car.  After surgery it may be brought to your room.  For patients admitted to the hospital, discharge time will be determined by your treatment team.  Patients discharged the day of surgery will not be allowed to drive home.    Special instructions:   [facility]- Preparing For Surgery  Before surgery,  you can play an important role. Because skin is not sterile, your skin needs to be as free of germs as possible. You can reduce the number of germs on your skin by washing with CHG (chlorahexidine gluconate) Soap before surgery.  CHG is an antiseptic cleaner which kills germs and bonds with the skin to continue killing germs even after washing.    Oral Hygiene is also important to reduce your risk of infection.  Remember - BRUSH YOUR TEETH THE MORNING OF SURGERY WITH YOUR REGULAR TOOTHPASTE  Please do not use if you have an allergy to CHG or antibacterial soaps. If your skin becomes reddened/irritated stop using the CHG.  Do not shave (including legs and underarms) for at least 48 hours prior to first CHG shower. It is OK to shave your face.  Please follow these instructions carefully.   1. Shower the NIGHT BEFORE SURGERY and the MORNING OF SURGERY with CHG.   2. If you chose to wash your hair, wash your hair first as usual with your normal shampoo.  3. After you shampoo, rinse your hair and body thoroughly to remove the  shampoo.  4. Use CHG as you would any other liquid soap. You can apply CHG directly to the skin and wash gently with a scrungie or a clean washcloth.   5. Apply the CHG Soap to your body ONLY FROM THE NECK DOWN.  Do not use on open wounds or open sores. Avoid contact with your eyes, ears, mouth and genitals (private parts). Wash Face and genitals (private parts)  with your normal soap.  6. Wash thoroughly, paying special attention to the area where your surgery will be performed.  7. Thoroughly rinse your body with warm water from the neck down.  8. DO NOT shower/wash with your normal soap after using and rinsing off the CHG Soap.  9. Pat yourself dry with a CLEAN TOWEL.  10. Wear CLEAN PAJAMAS to bed the night before surgery, wear comfortable clothes the morning of surgery  11. Place CLEAN SHEETS on your bed the night of your first shower and DO NOT SLEEP WITH  PETS.    Day of Surgery:  Do not apply any deodorants/lotions.  Please wear clean clothes to the hospital/surgery center.   Remember to brush your teeth WITH YOUR REGULAR TOOTHPASTE.    Please read over the following fact sheets that you were given. Coughing and Deep Breathing, MRSA Information and Surgical Site Infection Prevention

## 2018-08-02 ENCOUNTER — Encounter (HOSPITAL_COMMUNITY)
Admission: RE | Admit: 2018-08-02 | Discharge: 2018-08-02 | Disposition: A | Discharge status: Home / Self Care | Payer: Medicare Other | Source: Ambulatory Visit | Attending: Neurosurgery | Admitting: Neurosurgery

## 2018-08-02 ENCOUNTER — Other Ambulatory Visit: Payer: Self-pay

## 2018-08-02 ENCOUNTER — Encounter (HOSPITAL_COMMUNITY): Payer: Self-pay

## 2018-08-02 DIAGNOSIS — Z01818 Encounter for other preprocedural examination: Secondary | ICD-10-CM | POA: Diagnosis not present

## 2018-08-02 DIAGNOSIS — R9431 Abnormal electrocardiogram [ECG] [EKG]: Secondary | ICD-10-CM | POA: Diagnosis not present

## 2018-08-02 DIAGNOSIS — I10 Essential (primary) hypertension: Secondary | ICD-10-CM | POA: Insufficient documentation

## 2018-08-02 HISTORY — DX: Pneumonia, unspecified organism: J18.9

## 2018-08-02 HISTORY — DX: Unspecified osteoarthritis, unspecified site: M19.90

## 2018-08-02 HISTORY — DX: Gastro-esophageal reflux disease without esophagitis: K21.9

## 2018-08-02 LAB — CBC
HCT: 41.5 % (ref 36.0–46.0)
Hemoglobin: 13.5 g/dL (ref 12.0–15.0)
MCH: 29.5 pg (ref 26.0–34.0)
MCHC: 32.5 g/dL (ref 30.0–36.0)
MCV: 90.8 fL (ref 80.0–100.0)
Platelets: 241 10*3/uL (ref 150–400)
RBC: 4.57 MIL/uL (ref 3.87–5.11)
RDW: 13.5 % (ref 11.5–15.5)
WBC: 10.4 10*3/uL (ref 4.0–10.5)
nRBC: 0 % (ref 0.0–0.2)

## 2018-08-02 LAB — TYPE AND SCREEN
ABO/RH(D): O POS
Antibody Screen: NEGATIVE

## 2018-08-02 LAB — ABO/RH: ABO/RH(D): O POS

## 2018-08-02 LAB — SURGICAL PCR SCREEN
MRSA, PCR: NEGATIVE
Staphylococcus aureus: NEGATIVE

## 2018-08-02 LAB — BASIC METABOLIC PANEL
Anion gap: 9 (ref 5–15)
BUN: 20 mg/dL (ref 8–23)
CO2: 29 mmol/L (ref 22–32)
Calcium: 10.1 mg/dL (ref 8.9–10.3)
Chloride: 102 mmol/L (ref 98–111)
Creatinine, Ser: 0.69 mg/dL (ref 0.44–1.00)
GFR calc Af Amer: >60 mL/min (ref >60)
GFR calc non Af Amer: >60 mL/min (ref >60)
Glucose, Bld: 130 mg/dL — ABNORMAL HIGH (ref 70–99)
Potassium: 3.7 mmol/L (ref 3.5–5.1)
Sodium: 140 mmol/L (ref 135–145)

## 2018-08-02 NOTE — Progress Notes (Signed)
PCP: Odetta Pink  DM: denies  SA: denies  Pt denies SOB, cough, fever, chest pain  Pt stated understanding of instructions given for DOS.

## 2018-08-07 NOTE — Anesthesia Preprocedure Evaluation (Addendum)
Anesthesia Evaluation  Patient identified by MRN, date of birth, ID band Patient awake    Reviewed: Allergy & Precautions, NPO status , Patient's Chart, lab work & pertinent test results  History of Anesthesia Complications Negative for: history of anesthetic complications  Airway Mallampati: III  TM Distance: >3 FB Neck ROM: Full    Dental no notable dental hx. (+) Teeth Intact, Dental Advisory Given   Pulmonary neg pulmonary ROS, former smoker,    Pulmonary exam normal        Cardiovascular hypertension, Normal cardiovascular exam     Neuro/Psych negative neurological ROS  negative psych ROS   GI/Hepatic Neg liver ROS, GERD  ,  Endo/Other  negative endocrine ROS  Renal/GU negative Renal ROS  negative genitourinary   Musculoskeletal  (+) Arthritis ,   Abdominal   Peds  Hematology negative hematology ROS (+)   Anesthesia Other Findings 73 yo F for L4-S1 PLIF - HTN, GERD, BMI 38 - Hgb 13.5 - TTE 2015: EF 60-65%, mild LVH, grade 1 dd, no significant valve abnormality  Reproductive/Obstetrics                            Anesthesia Physical Anesthesia Plan  ASA: II  Anesthesia Plan: General   Post-op Pain Management:    Induction: Intravenous  PONV Risk Score and Plan: 3 and Ondansetron, Dexamethasone and Treatment may vary due to age or medical condition  Airway Management Planned: Oral ETT  Additional Equipment: None  Intra-op Plan:   Post-operative Plan: Extubation in OR  Informed Consent: I have reviewed the patients History and Physical, chart, labs and discussed the procedure including the risks, benefits and alternatives for the proposed anesthesia with the patient or authorized representative who has indicated his/her understanding and acceptance.     Dental advisory given  Plan Discussed with:   Anesthesia Plan Comments:        Anesthesia Quick  Evaluation

## 2018-08-08 ENCOUNTER — Encounter (HOSPITAL_COMMUNITY)
Admission: RE | Disposition: A | Discharge status: Home / Self Care | Payer: Self-pay | Source: Home / Self Care | Attending: Neurosurgery

## 2018-08-08 ENCOUNTER — Encounter (HOSPITAL_COMMUNITY): Payer: Self-pay

## 2018-08-08 ENCOUNTER — Inpatient Hospital Stay (HOSPITAL_COMMUNITY): Payer: Medicare Other | Admitting: Physician Assistant

## 2018-08-08 ENCOUNTER — Other Ambulatory Visit: Payer: Self-pay

## 2018-08-08 ENCOUNTER — Inpatient Hospital Stay (HOSPITAL_COMMUNITY): Payer: Medicare Other | Admitting: Anesthesiology

## 2018-08-08 ENCOUNTER — Inpatient Hospital Stay (HOSPITAL_COMMUNITY)
Admission: RE | Admit: 2018-08-08 | Discharge: 2018-08-10 | DRG: 455 | Disposition: A | Discharge status: Home / Self Care | Payer: Medicare Other | Attending: Neurological Surgery | Admitting: Neurological Surgery

## 2018-08-08 ENCOUNTER — Inpatient Hospital Stay (HOSPITAL_COMMUNITY): Payer: Medicare Other

## 2018-08-08 DIAGNOSIS — Z79899 Other long term (current) drug therapy: Secondary | ICD-10-CM

## 2018-08-08 DIAGNOSIS — M4316 Spondylolisthesis, lumbar region: Secondary | ICD-10-CM | POA: Diagnosis not present

## 2018-08-08 DIAGNOSIS — Z419 Encounter for procedure for purposes other than remedying health state, unspecified: Secondary | ICD-10-CM

## 2018-08-08 DIAGNOSIS — Z7982 Long term (current) use of aspirin: Secondary | ICD-10-CM | POA: Diagnosis not present

## 2018-08-08 DIAGNOSIS — Z9049 Acquired absence of other specified parts of digestive tract: Secondary | ICD-10-CM

## 2018-08-08 DIAGNOSIS — I10 Essential (primary) hypertension: Secondary | ICD-10-CM | POA: Diagnosis present

## 2018-08-08 DIAGNOSIS — M4317 Spondylolisthesis, lumbosacral region: Secondary | ICD-10-CM | POA: Diagnosis not present

## 2018-08-08 DIAGNOSIS — M5416 Radiculopathy, lumbar region: Secondary | ICD-10-CM | POA: Diagnosis not present

## 2018-08-08 DIAGNOSIS — M1990 Unspecified osteoarthritis, unspecified site: Secondary | ICD-10-CM | POA: Diagnosis not present

## 2018-08-08 DIAGNOSIS — M48061 Spinal stenosis, lumbar region without neurogenic claudication: Principal | ICD-10-CM | POA: Diagnosis present

## 2018-08-08 DIAGNOSIS — M545 Low back pain: Secondary | ICD-10-CM | POA: Diagnosis not present

## 2018-08-08 DIAGNOSIS — Z981 Arthrodesis status: Secondary | ICD-10-CM | POA: Diagnosis not present

## 2018-08-08 DIAGNOSIS — M4726 Other spondylosis with radiculopathy, lumbar region: Secondary | ICD-10-CM | POA: Diagnosis present

## 2018-08-08 DIAGNOSIS — K219 Gastro-esophageal reflux disease without esophagitis: Secondary | ICD-10-CM | POA: Diagnosis not present

## 2018-08-08 DIAGNOSIS — Z87891 Personal history of nicotine dependence: Secondary | ICD-10-CM | POA: Diagnosis not present

## 2018-08-08 HISTORY — PX: MAXIMUM ACCESS (MAS)POSTERIOR LUMBAR INTERBODY FUSION (PLIF) 2 LEVEL: SHX6369

## 2018-08-08 SURGERY — FOR MAXIMUM ACCESS (MAS) POSTERIOR LUMBAR INTERBODY FUSION (PLIF) 2 LEVEL
Anesthesia: General | Site: Back

## 2018-08-08 MED ORDER — CYCLOSPORINE 0.05 % OP EMUL
1.0000 [drp] | Freq: Two times a day (BID) | OPHTHALMIC | Status: DC
  Administered 2018-08-08 – 2018-08-09 (×3): 1 [drp] via OPHTHALMIC
  Filled 2018-08-08 (×5): qty 1

## 2018-08-08 MED ORDER — ONDANSETRON HCL 4 MG PO TABS
4.0000 mg | ORAL_TABLET | Freq: Four times a day (QID) | ORAL | Status: DC | PRN
  Administered 2018-08-09: 4 mg via ORAL
  Filled 2018-08-08: qty 1

## 2018-08-08 MED ORDER — ONDANSETRON HCL 4 MG/2ML IJ SOLN: 4.0000 mg | Freq: Four times a day (QID) | INTRAMUSCULAR | Status: DC | PRN

## 2018-08-08 MED ORDER — MIDAZOLAM HCL 5 MG/5ML IJ SOLN
INTRAMUSCULAR | Status: DC | PRN
  Administered 2018-08-08 (×2): 1 mg via INTRAVENOUS

## 2018-08-08 MED ORDER — LIDOCAINE 2% (20 MG/ML) 5 ML SYRINGE
INTRAMUSCULAR | Status: AC
  Filled 2018-08-08: qty 5

## 2018-08-08 MED ORDER — CHLORHEXIDINE GLUCONATE CLOTH 2 % EX PADS: 6.0000 | MEDICATED_PAD | Freq: Once | CUTANEOUS | Status: DC

## 2018-08-08 MED ORDER — METHOCARBAMOL 500 MG PO TABS
ORAL_TABLET | ORAL | Status: AC
  Filled 2018-08-08: qty 1

## 2018-08-08 MED ORDER — CEFAZOLIN SODIUM 1 G IJ SOLR
INTRAMUSCULAR | Status: AC
  Filled 2018-08-08: qty 20

## 2018-08-08 MED ORDER — ONDANSETRON HCL 4 MG/2ML IJ SOLN: 4.0000 mg | Freq: Once | INTRAMUSCULAR | Status: DC | PRN

## 2018-08-08 MED ORDER — MIDAZOLAM HCL 2 MG/2ML IJ SOLN
INTRAMUSCULAR | Status: AC
  Filled 2018-08-08: qty 2

## 2018-08-08 MED ORDER — PRAVASTATIN SODIUM 10 MG PO TABS
20.0000 mg | ORAL_TABLET | ORAL | Status: DC
  Administered 2018-08-08: 20 mg via ORAL
  Filled 2018-08-08: qty 2

## 2018-08-08 MED ORDER — FENTANYL CITRATE (PF) 250 MCG/5ML IJ SOLN
INTRAMUSCULAR | Status: AC
  Filled 2018-08-08: qty 5

## 2018-08-08 MED ORDER — METHOCARBAMOL 1000 MG/10ML IJ SOLN
500.0000 mg | Freq: Four times a day (QID) | INTRAVENOUS | Status: DC | PRN
  Filled 2018-08-08: qty 5

## 2018-08-08 MED ORDER — FLEET ENEMA 7-19 GM/118ML RE ENEM: 1.0000 | ENEMA | Freq: Once | RECTAL | Status: DC | PRN

## 2018-08-08 MED ORDER — DEXAMETHASONE SODIUM PHOSPHATE 10 MG/ML IJ SOLN
INTRAMUSCULAR | Status: AC
  Filled 2018-08-08: qty 1

## 2018-08-08 MED ORDER — BUPIVACAINE LIPOSOME 1.3 % IJ SUSP
20.0000 mL | INTRAMUSCULAR | Status: AC
  Administered 2018-08-08: 20 mL
  Filled 2018-08-08: qty 20

## 2018-08-08 MED ORDER — LIDOCAINE 2% (20 MG/ML) 5 ML SYRINGE
INTRAMUSCULAR | Status: DC | PRN
  Administered 2018-08-08: 100 mg via INTRAVENOUS

## 2018-08-08 MED ORDER — BISACODYL 10 MG RE SUPP: 10.0000 mg | Freq: Every day | RECTAL | Status: DC | PRN

## 2018-08-08 MED ORDER — HYDROCHLOROTHIAZIDE 25 MG PO TABS
25.0000 mg | ORAL_TABLET | Freq: Every day | ORAL | Status: DC
  Administered 2018-08-08 – 2018-08-09 (×2): 25 mg via ORAL
  Filled 2018-08-08 (×2): qty 1

## 2018-08-08 MED ORDER — DEXAMETHASONE SODIUM PHOSPHATE 10 MG/ML IJ SOLN
INTRAMUSCULAR | Status: DC | PRN
  Administered 2018-08-08: 10 mg via INTRAVENOUS

## 2018-08-08 MED ORDER — SODIUM CHLORIDE 0.9 % IV SOLN: 250.0000 mL | INTRAVENOUS | Status: DC

## 2018-08-08 MED ORDER — ACETAMINOPHEN 650 MG RE SUPP: 650.0000 mg | RECTAL | Status: DC | PRN

## 2018-08-08 MED ORDER — METHOCARBAMOL 500 MG PO TABS
500.0000 mg | ORAL_TABLET | Freq: Four times a day (QID) | ORAL | Status: DC | PRN
  Administered 2018-08-08 – 2018-08-09 (×5): 500 mg via ORAL
  Filled 2018-08-08 (×6): qty 1

## 2018-08-08 MED ORDER — LIDOCAINE-EPINEPHRINE 1 %-1:100000 IJ SOLN
INTRAMUSCULAR | Status: DC | PRN
  Administered 2018-08-08: 5 mL

## 2018-08-08 MED ORDER — ZOLPIDEM TARTRATE 5 MG PO TABS: 5.0000 mg | ORAL_TABLET | Freq: Every evening | ORAL | Status: DC | PRN

## 2018-08-08 MED ORDER — HYDROMORPHONE HCL 1 MG/ML IJ SOLN
INTRAMUSCULAR | Status: AC
Start: 2018-08-08 — End: 2018-08-09
  Filled 2018-08-08: qty 1

## 2018-08-08 MED ORDER — PHENOL 1.4 % MT LIQD: 1.0000 | OROMUCOSAL | Status: DC | PRN

## 2018-08-08 MED ORDER — DOCUSATE SODIUM 100 MG PO CAPS
100.0000 mg | ORAL_CAPSULE | Freq: Two times a day (BID) | ORAL | Status: DC
  Administered 2018-08-08 – 2018-08-09 (×4): 100 mg via ORAL
  Filled 2018-08-08 (×4): qty 1

## 2018-08-08 MED ORDER — THROMBIN 5000 UNITS EX SOLR
CUTANEOUS | Status: AC
  Filled 2018-08-08: qty 5000

## 2018-08-08 MED ORDER — ARTIFICIAL TEARS OPHTHALMIC OINT
TOPICAL_OINTMENT | OPHTHALMIC | Status: DC | PRN
  Administered 2018-08-08: 1 via OPHTHALMIC

## 2018-08-08 MED ORDER — VALACYCLOVIR HCL 500 MG PO TABS
500.0000 mg | ORAL_TABLET | Freq: Every day | ORAL | Status: DC
  Administered 2018-08-08 – 2018-08-09 (×2): 500 mg via ORAL
  Filled 2018-08-08 (×3): qty 1

## 2018-08-08 MED ORDER — CEFAZOLIN SODIUM-DEXTROSE 2-4 GM/100ML-% IV SOLN
2.0000 g | INTRAVENOUS | Status: AC
  Administered 2018-08-08 (×2): 2 g via INTRAVENOUS

## 2018-08-08 MED ORDER — HYDROMORPHONE HCL 1 MG/ML IJ SOLN
INTRAMUSCULAR | Status: AC
  Filled 2018-08-08: qty 0.5

## 2018-08-08 MED ORDER — OCUVITE-LUTEIN PO CAPS: 1.0000 | ORAL_CAPSULE | Freq: Every day | ORAL | Status: DC

## 2018-08-08 MED ORDER — ALBUMIN HUMAN 5 % IV SOLN
INTRAVENOUS | Status: DC | PRN
  Administered 2018-08-08: 10:00:00 via INTRAVENOUS

## 2018-08-08 MED ORDER — TRAMADOL HCL 50 MG PO TABS: 50.0000 mg | ORAL_TABLET | Freq: Four times a day (QID) | ORAL | Status: DC | PRN

## 2018-08-08 MED ORDER — HYDROMORPHONE HCL 1 MG/ML IJ SOLN
INTRAMUSCULAR | Status: DC | PRN
  Administered 2018-08-08 (×2): .25 mg via INTRAVENOUS

## 2018-08-08 MED ORDER — ASPIRIN EC 81 MG PO TBEC
81.0000 mg | DELAYED_RELEASE_TABLET | Freq: Every day | ORAL | Status: DC
  Administered 2018-08-08 – 2018-08-09 (×2): 81 mg via ORAL
  Filled 2018-08-08 (×2): qty 1

## 2018-08-08 MED ORDER — BUPIVACAINE HCL (PF) 0.5 % IJ SOLN
INTRAMUSCULAR | Status: AC
  Filled 2018-08-08: qty 30

## 2018-08-08 MED ORDER — PROPOFOL 10 MG/ML IV BOLUS
INTRAVENOUS | Status: DC | PRN
  Administered 2018-08-08: 120 mg via INTRAVENOUS

## 2018-08-08 MED ORDER — POLYETHYLENE GLYCOL 3350 17 G PO PACK: 17.0000 g | PACK | Freq: Every day | ORAL | Status: DC | PRN

## 2018-08-08 MED ORDER — HYDROCODONE-ACETAMINOPHEN 5-325 MG PO TABS
2.0000 | ORAL_TABLET | ORAL | Status: DC | PRN
  Administered 2018-08-08 – 2018-08-10 (×7): 2 via ORAL
  Filled 2018-08-08 (×7): qty 2

## 2018-08-08 MED ORDER — PHENYLEPHRINE HCL 10 MG PO TABS: 10.0000 mg | ORAL_TABLET | Freq: Four times a day (QID) | ORAL | Status: DC | PRN

## 2018-08-08 MED ORDER — SODIUM CHLORIDE 0.9% FLUSH
3.0000 mL | Freq: Two times a day (BID) | INTRAVENOUS | Status: DC
  Administered 2018-08-09: 3 mL via INTRAVENOUS

## 2018-08-08 MED ORDER — THROMBIN 5000 UNITS EX SOLR
OROMUCOSAL | Status: DC | PRN
  Administered 2018-08-08: 09:00:00 via TOPICAL

## 2018-08-08 MED ORDER — CEFAZOLIN SODIUM-DEXTROSE 2-4 GM/100ML-% IV SOLN
2.0000 g | Freq: Three times a day (TID) | INTRAVENOUS | Status: AC
  Administered 2018-08-08 (×2): 2 g via INTRAVENOUS
  Filled 2018-08-08 (×2): qty 100

## 2018-08-08 MED ORDER — MENTHOL 3 MG MT LOZG
1.0000 | LOZENGE | OROMUCOSAL | Status: DC | PRN
  Administered 2018-08-09: 3 mg via ORAL
  Filled 2018-08-08: qty 9

## 2018-08-08 MED ORDER — SUMATRIPTAN SUCCINATE 25 MG PO TABS
25.0000 mg | ORAL_TABLET | ORAL | Status: DC | PRN
  Administered 2018-08-09: 25 mg via ORAL
  Filled 2018-08-08 (×2): qty 1

## 2018-08-08 MED ORDER — PROSIGHT PO TABS
1.0000 | ORAL_TABLET | Freq: Every day | ORAL | Status: DC
  Administered 2018-08-08 – 2018-08-09 (×2): 1 via ORAL
  Filled 2018-08-08 (×3): qty 1

## 2018-08-08 MED ORDER — LACTATED RINGERS IV SOLN
INTRAVENOUS | Status: DC | PRN
  Administered 2018-08-08 (×3): via INTRAVENOUS

## 2018-08-08 MED ORDER — ONDANSETRON HCL 4 MG/2ML IJ SOLN
INTRAMUSCULAR | Status: AC
  Filled 2018-08-08: qty 2

## 2018-08-08 MED ORDER — MULTIVITAMINS PO CAPS: 1.0000 | ORAL_CAPSULE | Freq: Every day | ORAL | Status: DC

## 2018-08-08 MED ORDER — LIDOCAINE-EPINEPHRINE 1 %-1:100000 IJ SOLN
INTRAMUSCULAR | Status: AC
  Filled 2018-08-08: qty 1

## 2018-08-08 MED ORDER — BUPIVACAINE HCL (PF) 0.5 % IJ SOLN
INTRAMUSCULAR | Status: DC | PRN
  Administered 2018-08-08: 5 mL

## 2018-08-08 MED ORDER — HYDROXYZINE HCL 50 MG/ML IM SOLN
50.0000 mg | Freq: Four times a day (QID) | INTRAMUSCULAR | Status: DC | PRN
  Administered 2018-08-08: 50 mg via INTRAMUSCULAR
  Filled 2018-08-08: qty 1

## 2018-08-08 MED ORDER — OXYCODONE HCL 5 MG PO TABS: 5.0000 mg | ORAL_TABLET | Freq: Once | ORAL | Status: DC | PRN

## 2018-08-08 MED ORDER — PROPOFOL 10 MG/ML IV BOLUS
INTRAVENOUS | Status: AC
  Filled 2018-08-08: qty 40

## 2018-08-08 MED ORDER — ROCURONIUM BROMIDE 10 MG/ML (PF) SYRINGE
PREFILLED_SYRINGE | INTRAVENOUS | Status: DC | PRN
  Administered 2018-08-08: 50 mg via INTRAVENOUS

## 2018-08-08 MED ORDER — CEFAZOLIN SODIUM-DEXTROSE 2-4 GM/100ML-% IV SOLN
INTRAVENOUS | Status: AC
  Filled 2018-08-08: qty 100

## 2018-08-08 MED ORDER — SUGAMMADEX SODIUM 200 MG/2ML IV SOLN
INTRAVENOUS | Status: DC | PRN
  Administered 2018-08-08: 50 mg via INTRAVENOUS

## 2018-08-08 MED ORDER — OXYCODONE HCL 5 MG PO TABS: 5.0000 mg | ORAL_TABLET | ORAL | Status: DC | PRN

## 2018-08-08 MED ORDER — ONDANSETRON HCL 4 MG/2ML IJ SOLN
INTRAMUSCULAR | Status: DC | PRN
  Administered 2018-08-08: 4 mg via INTRAVENOUS

## 2018-08-08 MED ORDER — ACETAMINOPHEN 325 MG PO TABS: 650.0000 mg | ORAL_TABLET | ORAL | Status: DC | PRN

## 2018-08-08 MED ORDER — ARTIFICIAL TEARS OPHTHALMIC OINT
TOPICAL_OINTMENT | OPHTHALMIC | Status: AC
  Filled 2018-08-08: qty 3.5

## 2018-08-08 MED ORDER — SODIUM CHLORIDE 0.9 % IV SOLN
INTRAVENOUS | Status: DC | PRN
  Administered 2018-08-08: 25 ug/min via INTRAVENOUS

## 2018-08-08 MED ORDER — TRAZODONE 25 MG HALF TABLET
25.0000 mg | ORAL_TABLET | Freq: Every day | ORAL | Status: DC
  Filled 2018-08-08 (×2): qty 1

## 2018-08-08 MED ORDER — 0.9 % SODIUM CHLORIDE (POUR BTL) OPTIME
TOPICAL | Status: DC | PRN
  Administered 2018-08-08: 1000 mL

## 2018-08-08 MED ORDER — ROCURONIUM BROMIDE 50 MG/5ML IV SOSY
PREFILLED_SYRINGE | INTRAVENOUS | Status: AC
  Filled 2018-08-08: qty 5

## 2018-08-08 MED ORDER — BIOTIN 10000 MCG PO TABS: 1.0000 | ORAL_TABLET | Freq: Every day | ORAL | Status: DC

## 2018-08-08 MED ORDER — KCL IN DEXTROSE-NACL 20-5-0.45 MEQ/L-%-% IV SOLN: INTRAVENOUS | Status: DC

## 2018-08-08 MED ORDER — GABAPENTIN 300 MG PO CAPS
300.0000 mg | ORAL_CAPSULE | Freq: Three times a day (TID) | ORAL | Status: DC
  Administered 2018-08-08 – 2018-08-09 (×5): 300 mg via ORAL
  Filled 2018-08-08 (×5): qty 1

## 2018-08-08 MED ORDER — FENTANYL CITRATE (PF) 100 MCG/2ML IJ SOLN
INTRAMUSCULAR | Status: DC | PRN
  Administered 2018-08-08: 100 ug via INTRAVENOUS
  Administered 2018-08-08: 50 ug via INTRAVENOUS
  Administered 2018-08-08: 100 ug via INTRAVENOUS
  Administered 2018-08-08: 50 ug via INTRAVENOUS

## 2018-08-08 MED ORDER — OXYCODONE HCL 5 MG/5ML PO SOLN: 5.0000 mg | Freq: Once | ORAL | Status: DC | PRN

## 2018-08-08 MED ORDER — ALUM & MAG HYDROXIDE-SIMETH 200-200-20 MG/5ML PO SUSP: 30.0000 mL | Freq: Four times a day (QID) | ORAL | Status: DC | PRN

## 2018-08-08 MED ORDER — SODIUM CHLORIDE 0.9% FLUSH: 3.0000 mL | INTRAVENOUS | Status: DC | PRN

## 2018-08-08 MED ORDER — MORPHINE SULFATE (PF) 2 MG/ML IV SOLN: 2.0000 mg | INTRAVENOUS | Status: DC | PRN

## 2018-08-08 MED ORDER — PANTOPRAZOLE SODIUM 40 MG PO TBEC
80.0000 mg | DELAYED_RELEASE_TABLET | Freq: Every day | ORAL | Status: DC
  Administered 2018-08-08 – 2018-08-09 (×2): 80 mg via ORAL
  Filled 2018-08-08 (×2): qty 2

## 2018-08-08 MED ORDER — TIZANIDINE HCL 4 MG PO TABS
4.0000 mg | ORAL_TABLET | Freq: Every evening | ORAL | Status: DC | PRN
  Administered 2018-08-08 – 2018-08-10 (×2): 4 mg via ORAL
  Filled 2018-08-08 (×2): qty 1

## 2018-08-08 MED ORDER — ADULT MULTIVITAMIN W/MINERALS CH
1.0000 | ORAL_TABLET | Freq: Every day | ORAL | Status: DC
  Administered 2018-08-08 – 2018-08-09 (×2): 1 via ORAL
  Filled 2018-08-08: qty 1

## 2018-08-08 MED ORDER — HYDROMORPHONE HCL 1 MG/ML IJ SOLN
0.2500 mg | INTRAMUSCULAR | Status: DC | PRN
  Administered 2018-08-08 (×2): 0.5 mg via INTRAVENOUS

## 2018-08-08 SURGICAL SUPPLY — 89 items
ADH SKN CLS APL DERMABOND .7 (GAUZE/BANDAGES/DRESSINGS) ×1
BASKET BONE COLLECTION (BASKET) IMPLANT
BLADE CLIPPER SURG (BLADE) IMPLANT
BUR MATCHSTICK NEURO 3.0 LAGG (BURR) ×2 IMPLANT
BUR PRECISION FLUTE 5.0 (BURR) ×1 IMPLANT
BUR ROUND FLUTED 5 RND (BURR) ×2 IMPLANT
CAGE MAS PLIF 9X9X23-8 LUMBAR (Cage) ×2 IMPLANT
CANISTER SUCT 3000ML PPV (MISCELLANEOUS) ×2 IMPLANT
CAP RELINE MOD TULIP RMM (Cap) ×2 IMPLANT
CARTRIDGE OIL MAESTRO DRILL (MISCELLANEOUS) ×1 IMPLANT
CLIP NEUROVISION LG (CLIP) ×1 IMPLANT
CONT SPEC 4OZ CLIKSEAL STRL BL (MISCELLANEOUS) ×2 IMPLANT
COVER BACK TABLE 60X90IN (DRAPES) ×2 IMPLANT
COVER WAND RF STERILE (DRAPES) ×1 IMPLANT
DECANTER SPIKE VIAL GLASS SM (MISCELLANEOUS) ×2 IMPLANT
DERMABOND ADVANCED (GAUZE/BANDAGES/DRESSINGS) ×1
DERMABOND ADVANCED .7 DNX12 (GAUZE/BANDAGES/DRESSINGS) ×1 IMPLANT
DIFFUSER DRILL AIR PNEUMATIC (MISCELLANEOUS) ×2 IMPLANT
DRAPE C-ARM 42X72 X-RAY (DRAPES) ×2 IMPLANT
DRAPE C-ARMOR (DRAPES) ×2 IMPLANT
DRAPE LAPAROTOMY 100X72X124 (DRAPES) ×2 IMPLANT
DRAPE SURG 17X23 STRL (DRAPES) ×2 IMPLANT
DRSG OPSITE POSTOP 4X6 (GAUZE/BANDAGES/DRESSINGS) ×1 IMPLANT
DURAPREP 26ML APPLICATOR (WOUND CARE) ×2 IMPLANT
ELECT BLADE 4.0 EZ CLEAN MEGAD (MISCELLANEOUS) ×2
ELECT REM PT RETURN 9FT ADLT (ELECTROSURGICAL) ×2
ELECTRODE BLDE 4.0 EZ CLN MEGD (MISCELLANEOUS) IMPLANT
ELECTRODE REM PT RTRN 9FT ADLT (ELECTROSURGICAL) ×1 IMPLANT
EVACUATOR 1/8 PVC DRAIN (DRAIN) IMPLANT
GAUZE 4X4 16PLY RFD (DISPOSABLE) IMPLANT
GAUZE SPONGE 4X4 12PLY STRL (GAUZE/BANDAGES/DRESSINGS) ×1 IMPLANT
GLOVE BIO SURGEON STRL SZ8 (GLOVE) ×4 IMPLANT
GLOVE BIOGEL PI IND STRL 6.5 (GLOVE) IMPLANT
GLOVE BIOGEL PI IND STRL 7.5 (GLOVE) IMPLANT
GLOVE BIOGEL PI IND STRL 8 (GLOVE) ×2 IMPLANT
GLOVE BIOGEL PI IND STRL 8.5 (GLOVE) ×2 IMPLANT
GLOVE BIOGEL PI INDICATOR 6.5 (GLOVE) ×4
GLOVE BIOGEL PI INDICATOR 7.5 (GLOVE) ×1
GLOVE BIOGEL PI INDICATOR 8 (GLOVE) ×2
GLOVE BIOGEL PI INDICATOR 8.5 (GLOVE) ×2
GLOVE ECLIPSE 7.0 STRL STRAW (GLOVE) ×1 IMPLANT
GLOVE ECLIPSE 8.0 STRL XLNG CF (GLOVE) ×4 IMPLANT
GLOVE EXAM NITRILE XL STR (GLOVE) IMPLANT
GLOVE SURG SS PI 6.5 STRL IVOR (GLOVE) ×4 IMPLANT
GOWN STRL REUS W/ TWL LRG LVL3 (GOWN DISPOSABLE) IMPLANT
GOWN STRL REUS W/ TWL XL LVL3 (GOWN DISPOSABLE) ×3 IMPLANT
GOWN STRL REUS W/TWL 2XL LVL3 (GOWN DISPOSABLE) ×4 IMPLANT
GOWN STRL REUS W/TWL LRG LVL3 (GOWN DISPOSABLE)
GOWN STRL REUS W/TWL XL LVL3 (GOWN DISPOSABLE) ×6
HEMOSTAT POWDER KIT SURGIFOAM (HEMOSTASIS) ×2 IMPLANT
KIT BASIN OR (CUSTOM PROCEDURE TRAY) ×2 IMPLANT
KIT POSITION SURG JACKSON T1 (MISCELLANEOUS) ×2 IMPLANT
KIT TURNOVER KIT B (KITS) ×2 IMPLANT
MARKER SKIN DUAL TIP RULER LAB (MISCELLANEOUS) ×2 IMPLANT
MILL MEDIUM DISP (BLADE) ×1 IMPLANT
MODULE NVM5 NEXT GEN EMG (NEEDLE) ×1 IMPLANT
NDL HYPO 21X1.5 SAFETY (NEEDLE) ×1 IMPLANT
NDL HYPO 25X1 1.5 SAFETY (NEEDLE) ×1 IMPLANT
NDL SPNL 18GX3.5 QUINCKE PK (NEEDLE) IMPLANT
NEEDLE HYPO 21X1.5 SAFETY (NEEDLE) ×2 IMPLANT
NEEDLE HYPO 25X1 1.5 SAFETY (NEEDLE) ×2 IMPLANT
NEEDLE SPNL 18GX3.5 QUINCKE PK (NEEDLE) ×2 IMPLANT
NS IRRIG 1000ML POUR BTL (IV SOLUTION) ×2 IMPLANT
OIL CARTRIDGE MAESTRO DRILL (MISCELLANEOUS) ×2
PACK LAMINECTOMY NEURO (CUSTOM PROCEDURE TRAY) ×2 IMPLANT
PAD ARMBOARD 7.5X6 YLW CONV (MISCELLANEOUS) ×6 IMPLANT
PATTIES SURGICAL .5 X.5 (GAUZE/BANDAGES/DRESSINGS) IMPLANT
PATTIES SURGICAL .5 X1 (DISPOSABLE) ×1 IMPLANT
PATTIES SURGICAL 1X1 (DISPOSABLE) IMPLANT
ROD RELINE-O COCR 5.0X55MM (Rod) ×2 IMPLANT
SCREW LOCK RSS 4.5/5.0MM (Screw) ×7 IMPLANT
SCREW POLY RMM 5.5X40 4S (Screw) ×2 IMPLANT
SCREW RELINE RMM 6.5X40 4S (Screw) ×2 IMPLANT
SCREW SHANK RELINE MOD 5.5X35 (Screw) ×1 IMPLANT
SHANK RELINE MOD 5.5X40 (Screw) ×1 IMPLANT
SPONGE LAP 4X18 RFD (DISPOSABLE) IMPLANT
SPONGE SURGIFOAM ABS GEL 100 (HEMOSTASIS) IMPLANT
STAPLER SKIN PROX WIDE 3.9 (STAPLE) ×1 IMPLANT
SUT VIC AB 1 CT1 18XBRD ANBCTR (SUTURE) ×2 IMPLANT
SUT VIC AB 1 CT1 8-18 (SUTURE) ×4
SUT VIC AB 2-0 CT1 18 (SUTURE) ×4 IMPLANT
SUT VIC AB 3-0 SH 8-18 (SUTURE) ×4 IMPLANT
SYR 20CC LL (SYRINGE) ×2 IMPLANT
SYR 3ML LL SCALE MARK (SYRINGE) ×8 IMPLANT
SYR 5ML LL (SYRINGE) ×1 IMPLANT
TOWEL GREEN STERILE (TOWEL DISPOSABLE) ×2 IMPLANT
TOWEL GREEN STERILE FF (TOWEL DISPOSABLE) ×2 IMPLANT
TRAY FOLEY MTR SLVR 16FR STAT (SET/KITS/TRAYS/PACK) ×2 IMPLANT
WATER STERILE IRR 1000ML POUR (IV SOLUTION) ×2 IMPLANT

## 2018-08-08 NOTE — Anesthesia Postprocedure Evaluation (Signed)
Anesthesia Post Note  Patient: Angie [name]  Procedure(s) Performed: Decompression and  Fusion of Lumbar Four-Five Lumbar Five-Sacral One, Pedicle Screws of Lumbar Four- Lumbar Five, Lumbar Five- Sacral One, Lumbar Four-Lumbar Five Interbodies (N/A Back)     Patient location during evaluation: PACU Anesthesia Type: General Level of consciousness: awake and alert Pain management: pain level controlled Vital Signs Assessment: post-procedure vital signs reviewed and stable Respiratory status: spontaneous breathing, nonlabored ventilation and respiratory function stable Cardiovascular status: blood pressure returned to baseline and stable Postop Assessment: no apparent nausea or vomiting Anesthetic complications: no    Last Vitals:  Vitals:   08/08/18 1426 08/08/18 1604  BP: (!) 152/71 (!) 145/62  Pulse: 85 81  Resp: 16 16  Temp: (!) 36.3 C   SpO2: 98% 100%    Last Pain:  Vitals:   08/08/18 1426  TempSrc: Oral  PainSc:                  Lidia Collum

## 2018-08-08 NOTE — Brief Op Note (Signed)
08/08/2018  12:17 PM  PATIENT:  Angie [name]  73 y.o. female  PRE-OPERATIVE DIAGNOSIS:  Degenerative lumbar spinal stenosis, lumbar spondylolisthesis, lumbar radiculopathy, lumbago  POST-OPERATIVE DIAGNOSIS:  Degenerative lumbar spinal stenosis, lumbar spondylolisthesis, lumbar radiculopathy, lumbago  PROCEDURE:  Procedure(s) with comments: Decompression and  Fusion of Lumbar Four-Five Lumbar Five-Sacral One, Pedicle Screws of Lumbar Four- Lumbar Five, Lumbar Five- Sacral One, Lumbar Four-Lumbar Five Interbodies (N/A) - Decompression and  Fusion of Lumbar Four-Five Lumbar Five-Sacral One, Pedicle Screws of Lumbar Four- Lumbar Five, Lumbar Five- Sacral One, Lumbar Four-Lumbar Five Interbodies, pedicle screw fixation L 4, L 5 S 1 levels with posterolateral arthrodesis  SURGEON:  Surgeon(s) and Role:    Erline Levine, MD - Primary    Consuella Lose, MD - Assisting  PHYSICIAN ASSISTANT:   ASSISTANTS: Poteat, RN   ANESTHESIA:   general  EBL:  200 mL   BLOOD ADMINISTERED:none  DRAINS: none   LOCAL MEDICATIONS USED:  MARCAINE    and LIDOCAINE   SPECIMEN:  No Specimen  DISPOSITION OF SPECIMEN:  N/A  COUNTS:  YES  TOURNIQUET:  * No tourniquets in log *  DICTATION: DICTATION: Patient is a  73 year old with spondylosis , stenosis, spondylolisthesis and severe back and bilateral lower extremity pain, right greater than left at L4/5 and L 5 S 1 levels of the lumbar spine.  It was elected to take her to surgery for MASPLIF at  L 45 and L 5 S 1  levels with posterolateral arthrodesis.  Procedure:   Following uncomplicated induction of GETA, and placement of electrodes for neural monitoring, patient was turned into a prone position on the [place] tableand using AP  fluoroscopy the area of planned incision was marked, prepped with betadine scrub and Duraprep, then draped. Exposure was performed of facet joint complex at L 45 and L 5 S 1 levels and the MAS retractor was  placed. 5.5 x 40 mm cortical Nuvasive screws were placed at L 4 bilaterally according to standard landmarks using neural monitoring.  A total laminectomy of L 4 and L 5 was then performed with disarticulation of facets.  Decompression was greater than for standard PLIF procedure and thorough decompression of the thecal sac, bilateral L 4, L 5, S1 nerve roots was performed. Along with foraminal and extraforaminal portions of these nerve roots.  This bone was saved for grafting, after being run through bone mill and was placed in bone packing device.  The facets at the L 5 S 1 level had appeared to autofused and on inspection of the annulus, this also appeared to be in the process of fusion.  As a result, I did not place interbody prostheses at the L 5 S 1 level.     Thorough discectomy was performed bilaterally at L 45  and the endplates were prepared for grafting.  23 x 9 x 8 degree cages were placed in the interspace and positioning was confirmed with AP and lateral fluoroscopy.  10 cc of autograft was packed in the interspace medial to the second cage.   Remaining screws were placed at L 5 (40 x 5.5 ) and and S 1 (40 x 6.5)  and 55 mm rods were placed. And the screws were locked and torqued.Final Xrays showed well positioned implants and screw fixation. The posterolateral region on the left was packed with 30 cc of autograft and a similar amount was packed on the right. The wounds were irrigated and then closed with 1, 2-0 and  3-0 Vicryl stitches. 20 cc long-acting Marcaine was injected into the musculature.  Sterile occlusive dressing was placed with Dermabond and an occlusive dressing. The patient was then extubated in the operating room and taken to recovery in stable and satisfactory condition having tolerated her operation well. Counts were correct at the end of the case.  PLAN OF CARE: Admit to inpatient   PATIENT DISPOSITION:  PACU - hemodynamically stable.   Delay start of Pharmacological VTE agent  (>24hrs) due to surgical blood loss or risk of bleeding: yes

## 2018-08-08 NOTE — Op Note (Signed)
08/08/2018  12:17 PM  PATIENT:  Angie [name]  73 y.o. female  PRE-OPERATIVE DIAGNOSIS:  Degenerative lumbar spinal stenosis, lumbar spondylolisthesis, lumbar radiculopathy, lumbago  POST-OPERATIVE DIAGNOSIS:  Degenerative lumbar spinal stenosis, lumbar spondylolisthesis, lumbar radiculopathy, lumbago  PROCEDURE:  Procedure(s) with comments: Decompression and  Fusion of Lumbar Four-Five Lumbar Five-Sacral One, Pedicle Screws of Lumbar Four- Lumbar Five, Lumbar Five- Sacral One, Lumbar Four-Lumbar Five Interbodies (N/A) - Decompression and  Fusion of Lumbar Four-Five Lumbar Five-Sacral One, Pedicle Screws of Lumbar Four- Lumbar Five, Lumbar Five- Sacral One, Lumbar Four-Lumbar Five Interbodies, pedicle screw fixation L 4, L 5 S 1 levels with posterolateral arthrodesis  SURGEON:  Surgeon(s) and Role:    Erline Levine, MD - Primary    Consuella Lose, MD - Assisting  PHYSICIAN ASSISTANT:   ASSISTANTS: Poteat, RN   ANESTHESIA:   general  EBL:  200 mL   BLOOD ADMINISTERED:none  DRAINS: none   LOCAL MEDICATIONS USED:  MARCAINE    and LIDOCAINE   SPECIMEN:  No Specimen  DISPOSITION OF SPECIMEN:  N/A  COUNTS:  YES  TOURNIQUET:  * No tourniquets in log *  DICTATION: DICTATION: Patient is a  73 year old with spondylosis , stenosis, spondylolisthesis and severe back and bilateral lower extremity pain, right greater than left at L4/5 and L 5 S 1 levels of the lumbar spine.  It was elected to take her to surgery for MASPLIF at  L 45 and L 5 S 1  levels with posterolateral arthrodesis.  Procedure:   Following uncomplicated induction of GETA, and placement of electrodes for neural monitoring, patient was turned into a prone position on the [place] tableand using AP  fluoroscopy the area of planned incision was marked, prepped with betadine scrub and Duraprep, then draped. Exposure was performed of facet joint complex at L 45 and L 5 S 1 levels and the MAS retractor was  placed. 5.5 x 40 mm cortical Nuvasive screws were placed at L 4 bilaterally according to standard landmarks using neural monitoring.  A total laminectomy of L 4 and L 5 was then performed with disarticulation of facets.  Decompression was greater than for standard PLIF procedure and thorough decompression of the thecal sac, bilateral L 4, L 5, S1 nerve roots was performed. Along with foraminal and extraforaminal portions of these nerve roots.  This bone was saved for grafting, after being run through bone mill and was placed in bone packing device.  The facets at the L 5 S 1 level had appeared to autofused and on inspection of the annulus, this also appeared to be in the process of fusion.  As a result, I did not place interbody prostheses at the L 5 S 1 level.     Thorough discectomy was performed bilaterally at L 45  and the endplates were prepared for grafting.  23 x 9 x 8 degree cages were placed in the interspace and positioning was confirmed with AP and lateral fluoroscopy.  10 cc of autograft was packed in the interspace medial to the second cage.   Remaining screws were placed at L 5 (40 x 5.5 ) and and S 1 (40 x 6.5)  and 55 mm rods were placed. And the screws were locked and torqued.Final Xrays showed well positioned implants and screw fixation. The posterolateral region on the left was packed with 30 cc of autograft and a similar amount was packed on the right. The wounds were irrigated and then closed with 1, 2-0 and  3-0 Vicryl stitches. 20 cc long-acting Marcaine was injected into the musculature.  Sterile occlusive dressing was placed with Dermabond and an occlusive dressing. The patient was then extubated in the operating room and taken to recovery in stable and satisfactory condition having tolerated her operation well. Counts were correct at the end of the case.  PLAN OF CARE: Admit to inpatient   PATIENT DISPOSITION:  PACU - hemodynamically stable.   Delay start of Pharmacological VTE agent  (>24hrs) due to surgical blood loss or risk of bleeding: yes

## 2018-08-08 NOTE — Anesthesia Procedure Notes (Signed)
Procedure Name: Intubation Date/Time: 08/08/2018 7:41 AM Performed by: Verdie Drown, CRNA Pre-anesthesia Checklist: Patient identified, Emergency Drugs available, Suction available and Patient being monitored Patient Re-evaluated:Patient Re-evaluated prior to induction Oxygen Delivery Method: Circle System Utilized Preoxygenation: Pre-oxygenation with 100% oxygen Induction Type: IV induction Ventilation: Mask ventilation without difficulty Laryngoscope Size: Mac and 3 Grade View: Grade II Tube type: Oral Number of attempts: 1 Airway Equipment and Method: Stylet and Oral airway Placement Confirmation: ETT inserted through vocal cords under direct vision,  positive ETCO2 and breath sounds checked- equal and bilateral Secured at: 22 cm Tube secured with: Tape Dental Injury: Teeth and Oropharynx as per pre-operative assessment

## 2018-08-08 NOTE — Interval H&P Note (Signed)
History and Physical Interval Note:  08/08/2018 7:31 AM  Angie [name]  has presented today for surgery, with the diagnosis of Degenerative lumbar spinal stenosis  The various methods of treatment have been discussed with the patient and family. After consideration of risks, benefits and other options for treatment, the patient has consented to  Procedure(s) with comments: Lumbar 4-5 Lumbar 5-Sacral 1 Maximum access posterior lumbar interbody fusion (N/A) - Lumbar 4-5 Lumbar 5-Sacral 1 Maximum access posterior lumbar interbody fusion as a surgical intervention .  The patient's history has been reviewed, patient examined, no change in status, stable for surgery.  I have reviewed the patient's chart and labs.  Questions were answered to the patient's satisfaction.     [name]

## 2018-08-08 NOTE — Progress Notes (Signed)
Awake, alert, conversant.  Sleepy, but arouses.  MAEW with good strength.  Doing well.

## 2018-08-08 NOTE — Progress Notes (Signed)
PHARMACIST - PHYSICIAN ORDER COMMUNICATION  CONCERNING: P&T Medication Policy on Herbal Medications  DESCRIPTION:  This patient's order for:  biotin  has been noted.  This product(s) is classified as an "herbal" or natural product. Due to a lack of definitive safety studies or FDA approval, nonstandard manufacturing practices, plus the potential risk of unknown drug-drug interactions while on inpatient medications, the Pharmacy and Therapeutics Committee does not permit the use of "herbal" or natural products of this type within [facility].   ACTION TAKEN: The pharmacy department is unable to verify this order at this time and your patient has been informed of this safety policy. Please reevaluate patient's clinical condition at discharge and address if the herbal or natural product(s) should be resumed at that time.  Eliceo Gladu A. Levada Dy, PharmD, [place] Pager: [phone] Please utilize Amion for appropriate phone number to reach the unit pharmacist ([place])

## 2018-08-08 NOTE — Transfer of Care (Signed)
Immediate Anesthesia Transfer of Care Note  Patient: Angie [name]  Procedure(s) Performed: Decompression and  Fusion of Lumbar Four-Five Lumbar Five-Sacral One, Pedicle Screws of Lumbar Four- Lumbar Five, Lumbar Five- Sacral One, Lumbar Four-Lumbar Five Interbodies (N/A Back)  Patient Location: PACU  Anesthesia Type:General  Level of Consciousness: patient cooperative and responds to stimulation  Airway & Oxygen Therapy: Patient Spontanous Breathing and Patient connected to nasal cannula oxygen, OPA  Post-op Assessment: Report given to RN and Post -op Vital signs reviewed and stable  Post vital signs: Reviewed and stable  Last Vitals:  Vitals Value Taken Time  BP 137/72 08/08/2018 12:15 PM  Temp 36.5 C 08/08/2018 12:14 PM  Pulse 88 08/08/2018 12:16 PM  Resp 11 08/08/2018 12:16 PM  SpO2 99 % 08/08/2018 12:16 PM  Vitals shown include unvalidated device data.  Last Pain:  Vitals:   08/08/18 1214  TempSrc:   PainSc: 0-No pain      Patients Stated Pain Goal: 3 (42/10/31 2811)  Complications: No apparent anesthesia complications

## 2018-08-08 NOTE — Evaluation (Signed)
Physical Therapy Evaluation Patient Details Name: Angie [name] MRN: [id number] DOB: 1946-01-07 Today's Date: 08/08/2018   History of Present Illness  pt is a 73 y/o female with pmh significant for HTN, chronic BP admitted with back pain from degenerative spinal stenosis, spondylolisthesis and radiculopathy admitted for decompression and fusion at L4 to S1.  Clinical Impression  Pt admitted with/for elective lumbar fusion surgery.  Pt is needing min assist at this time for most mobility.  Pt currently limited functionally due to the problems listed below.  (see problems list.)  Pt will benefit from PT to maximize function and safety to be able to get home safely with available assist.     Follow Up Recommendations No PT follow up;Supervision/Assistance - 24 hour    Equipment Recommendations  Rolling walker with 5" wheels;3in1 (PT)    Recommendations for Other Services       Precautions / Restrictions Precautions Precautions: Back Restrictions Weight Bearing Restrictions: No      Mobility  Bed Mobility Overal bed mobility: Needs Assistance Bed Mobility: Rolling;Sidelying to Sit;Sit to Sidelying Rolling: Min assist Sidelying to sit: Min assist     Sit to sidelying: Mod assist    Transfers Overall transfer level: Needs assistance   Transfers: Sit to/from Stand Sit to Stand: Min assist         General transfer comment: cuing for hand placement/safety  Ambulation/Gait Ambulation/Gait assistance: Min guard Gait Distance (Feet): 170 Feet Assistive device: Rolling walker (2 wheeled) Gait Pattern/deviations: Step-through pattern   Gait velocity interpretation: <1.31 ft/sec, indicative of household ambulator General Gait Details: tentative and stiff with moderate use of the RW  Stairs            Wheelchair Mobility    Modified Rankin (Stroke Patients Only)       Balance Overall balance assessment: Needs assistance Sitting-balance support: No  upper extremity supported Sitting balance-Leahy Scale: Fair     Standing balance support: Bilateral upper extremity supported Standing balance-Leahy Scale: Poor Standing balance comment: reliant on the RW                             Pertinent Vitals/Pain Pain Assessment: 0-10 Pain Score: 8  Pain Location: incisional Pain Descriptors / Indicators: Sore Pain Intervention(s): Monitored during session    Home Living Family/patient expects to be discharged to:: Private residence Living Arrangements: Spouse/significant other Available Help at Discharge: Family;Available 24 hours/day Type of Home: House Home Access: Stairs to enter Entrance Stairs-Rails: Psychiatric nurse of Steps: 7 Home Layout: Two level;Able to live on main level with bedroom/bathroom        Prior Function Level of Independence: Independent               Hand Dominance        Extremity/Trunk Assessment   Upper Extremity Assessment Upper Extremity Assessment: Overall WFL for tasks assessed    Lower Extremity Assessment Lower Extremity Assessment: Overall WFL for tasks assessed(general weakness due to pain)    Cervical / Trunk Assessment Cervical / Trunk Assessment: Other exceptions Cervical / Trunk Exceptions: lumbar surgery  Communication   Communication: No difficulties  Cognition Arousal/Alertness: Awake/alert Behavior During Therapy: WFL for tasks assessed/performed Overall Cognitive Status: Within Functional Limits for tasks assessed  General Comments General comments (skin integrity, edema, etc.): pt and husband instructed in back care/pre, log roll, transition to/from side, donning the brace, liting restrictions and progression of activity    Exercises     Assessment/Plan    PT Assessment Patient needs continued PT services  PT Problem List Decreased activity tolerance;Decreased mobility;Decreased  knowledge of use of DME;Decreased knowledge of precautions;Pain       PT Treatment Interventions Gait training;Functional mobility training;Therapeutic activities;Patient/family education;Stair training;DME instruction    PT Goals (Current goals can be found in the Care Plan section)  Acute Rehab PT Goals Patient Stated Goal: independent PT Goal Formulation: With patient Time For Goal Achievement: 08/15/18 Potential to Achieve Goals: Good    Frequency Min 5X/week   Barriers to discharge        Co-evaluation               AM-PAC PT "6 Clicks" Mobility  Outcome Measure Help needed turning from your back to your side while in a flat bed without using bedrails?: A Little Help needed moving from lying on your back to sitting on the side of a flat bed without using bedrails?: A Lot Help needed moving to and from a bed to a chair (including a wheelchair)?: A Little Help needed standing up from a chair using your arms (e.g., wheelchair or bedside chair)?: A Lot Help needed to walk in hospital room?: A Little Help needed climbing 3-5 steps with a railing? : A Lot 6 Click Score: 15    End of Session   Activity Tolerance: Patient tolerated treatment well Patient left: in bed;with call bell/phone within reach Nurse Communication: Mobility status PT Visit Diagnosis: Other abnormalities of gait and mobility (R26.89);Pain Pain - part of body: (back)    Time: 3159-4585 PT Time Calculation (min) (ACUTE ONLY): 50 min   Charges:   PT Evaluation $PT Eval Low Complexity: 1 Low PT Treatments $Gait Training: 8-22 mins $Self Care/Home Management: 8-22        08/08/2018  Donnella Sham, PT Acute Rehabilitation Services [phone]  (pager) [phone]  (office)  Tessie Fass Senta Kantor 08/08/2018, 6:07 PM

## 2018-08-09 ENCOUNTER — Encounter (HOSPITAL_COMMUNITY): Payer: Self-pay | Admitting: Neurosurgery

## 2018-08-09 NOTE — Evaluation (Signed)
Occupational Therapy Evaluation Patient Details Name: Angie [name] MRN: [id number] DOB: [birth year] Today's Date: 08/09/2018    History of Present Illness pt is a 73 y/o female with pmh significant for HTN, chronic BP admitted with back pain from degenerative spinal stenosis, spondylolisthesis and radiculopathy admitted for decompression and fusion at L4 to S1.   Clinical Impression   This 73 y/o female presents with the above. At baseline pt reports she was independent with ADL and functional mobility. Pt performing functional mobility using RW this session with overall minguard assist. She currently requires setup/minguard assist for seated UB ADL, modA for LB ADL secondary to increased difficulty accessing LEs. Educated pt re: back precautions, use of AE for LB ADL, safety and compensatory strategies for performing ADL and functional transfers with pt verbalizing understanding throughout. Pt will return home with significant other who she reports is able to assist with ADL PRN. Will continue to follow while she remains in acute setting to maximize her overall safety and independence with ADL and mobility prior to return home.     Follow Up Recommendations  No OT follow up;Supervision/Assistance - 24 hour    Equipment Recommendations  3 in 1 bedside commode           Precautions / Restrictions Precautions Precautions: Back Precaution Booklet Issued: No Precaution Comments: Reviewed precautions verbally; pt able to recall start of session Required Braces or Orthoses: Spinal Brace Spinal Brace: Lumbar corset;Applied in sitting position Restrictions Weight Bearing Restrictions: No      Mobility Bed Mobility               General bed mobility comments: pt received sitting up in recliner  Transfers Overall transfer level: Needs assistance Equipment used: Rolling walker (2 wheeled) Transfers: Sit to/from Stand Sit to Stand: Min guard         General transfer  comment: cuing for hand placement/safety. No assist required to power-up to full stand.     Balance Overall balance assessment: Needs assistance Sitting-balance support: No upper extremity supported Sitting balance-Leahy Scale: Fair     Standing balance support: Bilateral upper extremity supported Standing balance-Leahy Scale: Poor Standing balance comment: reliant on the RW                           ADL either performed or assessed with clinical judgement   ADL Overall ADL's : Needs assistance/impaired Eating/Feeding: Modified independent;Sitting   Grooming: Standing;Minimal assistance   Upper Body Bathing: Min guard;Sitting   Lower Body Bathing: Minimal assistance;Sit to/from stand Lower Body Bathing Details (indicate cue type and reason): educated on use of LH sponge to increase independence with activity Upper Body Dressing : Set up;Min guard;Sitting Upper Body Dressing Details (indicate cue type and reason): reviewed brace management  Lower Body Dressing: Moderate assistance;Sit to/from stand Lower Body Dressing Details (indicate cue type and reason): pt reports she is able to perform figure 4 technique but with increased difficulty and sometimes with back pain, educated to defer use of this method due to current back precautions, pt reports has assist from spouse and also educated on use of reacher for LB ADL with pt return demonstrating Toilet Transfer: Minimal assistance;Min guard;Ambulation;BSC;RW Toilet Transfer Details (indicate cue type and reason): BSC over toilet; simulated in transfer to/from [place] and Hygiene: Minimal assistance;Sit to/from stand   Tub/ Shower Transfer: Walk-in shower;Minimal assistance;Min guard;Ambulation;Rolling walker Tub/Shower Transfer Details (indicate cue type and reason): simulated in room  with pt return demonstrating, min cues for technique; educated on use of 3:1 as shower chair if needed as  well  Functional mobility during ADLs: Min guard;Minimal assistance;Rolling walker General ADL Comments: educated pt/pt's spouse on back precautions, safety and compensatory strategies for performing ADL and functional transfers     Vision         Perception     Praxis      Pertinent Vitals/Pain Pain Assessment: Faces Faces Pain Scale: Hurts little more Pain Location: incisional Pain Descriptors / Indicators: Sore Pain Intervention(s): Limited activity within patient's tolerance;Monitored during session;Repositioned     Hand Dominance     Extremity/Trunk Assessment Upper Extremity Assessment Upper Extremity Assessment: Overall WFL for tasks assessed   Lower Extremity Assessment Lower Extremity Assessment: Defer to PT evaluation   Cervical / Trunk Assessment Cervical / Trunk Assessment: Other exceptions Cervical / Trunk Exceptions: lumbar surgery   Communication Communication Communication: No difficulties   Cognition Arousal/Alertness: Awake/alert Behavior During Therapy: WFL for tasks assessed/performed Overall Cognitive Status: Within Functional Limits for tasks assessed                                     General Comments       Exercises     Shoulder Instructions      Home Living Family/patient expects to be discharged to:: Private residence Living Arrangements: Spouse/significant other Available Help at Discharge: Family;Available 24 hours/day Type of Home: House Home Access: Stairs to enter [employer] of Steps: 7 Entrance Stairs-Rails: Right;Left Home Layout: Two level;Able to live on main level with bedroom/bathroom Alternate Level Stairs-Number of Steps: flight Alternate Level Stairs-Rails: Right;Left Bathroom Shower/Tub: Walk-in shower;Tub only   Bathroom Toilet: Handicapped height     Home Equipment: Shower seat - built Hotel manager: Reacher        Prior Functioning/Environment  Level of Independence: Independent                 OT Problem List: Decreased strength;Decreased range of motion;Decreased activity tolerance;Impaired balance (sitting and/or standing);Decreased safety awareness;Decreased knowledge of use of DME or AE;Decreased knowledge of precautions;Pain      OT Treatment/Interventions: Self-care/ADL training;Therapeutic exercise;Neuromuscular education;DME and/or AE instruction;Therapeutic activities;Patient/family education;Balance training    OT Goals(Current goals can be found in the care plan section) Acute Rehab OT Goals Patient Stated Goal: independent OT Goal Formulation: With patient Time For Goal Achievement: 08/23/18 Potential to Achieve Goals: Good  OT Frequency: Min 2X/week   Barriers to D/C:            Co-evaluation              AM-PAC OT "6 Clicks" Daily Activity     Outcome Measure Help from another person eating meals?: None Help from another person taking care of personal grooming?: A Little Help from another person toileting, which includes using toliet, bedpan, or urinal?: A Little Help from another person bathing (including washing, rinsing, drying)?: A Little Help from another person to put on and taking off regular upper body clothing?: None Help from another person to put on and taking off regular lower body clothing?: A Little 6 Click Score: 20   End of Session Equipment Utilized During Treatment: Gait belt;Rolling walker;Back brace Nurse Communication: Mobility status  Activity Tolerance: Patient tolerated treatment well Patient left: in chair;with call bell/phone within reach;with family/visitor present  OT Visit Diagnosis: Other abnormalities of gait  and mobility (R26.89);Unsteadiness on feet (R26.81);Pain Pain - part of body: (back)                Time: 3570-1779 OT Time Calculation (min): 25 min Charges:  OT General Charges $OT Visit: 1 Visit OT Evaluation $OT Eval Low Complexity: 1 Low OT  Treatments $Self Care/Home Management : 8-22 mins  Lou Cal, OT Supplemental Rehabilitation Services Pager [phone] Office [phone]   Raymondo Band 08/09/2018, 11:55 AM

## 2018-08-09 NOTE — Progress Notes (Signed)
Physical Therapy Treatment Patient Details Name: Angie [name] MRN: [id number] DOB: 01/07/1946 Today's Date: 08/09/2018    History of Present Illness pt is a 73 y/o female with pmh significant for HTN, chronic BP admitted with back pain from degenerative spinal stenosis, spondylolisthesis and radiculopathy admitted for decompression and fusion at L4 to S1.    PT Comments    Pt progressing towards physical therapy goals. Slow but generally steady with all OOB mobility. Pt and husband were educated on safe stair negotiation, and they were also educated on car transfer, positioning recommendations, and general safety with maintenance of precautions. Will continue to follow.    Follow Up Recommendations  No PT follow up;Supervision/Assistance - 24 hour     Equipment Recommendations  Rolling walker with 5" wheels;3in1 (PT)    Recommendations for Other Services       Precautions / Restrictions Precautions Precautions: Back Precaution Booklet Issued: No Precaution Comments: Reviewed precautions verbally Required Braces or Orthoses: Spinal Brace Spinal Brace: Lumbar corset;Applied in sitting position Restrictions Weight Bearing Restrictions: No    Mobility  Bed Mobility               General bed mobility comments: Pt sitting up in recliner upon PT arrival.   Transfers Overall transfer level: Needs assistance Equipment used: Rolling walker (2 wheeled) Transfers: Sit to/from Stand Sit to Stand: Supervision         General transfer comment: cuing for hand placement/safety. No assist required to power-up to full stand.   Ambulation/Gait Ambulation/Gait assistance: Min guard;Supervision Gait Distance (Feet): 200 Feet Assistive device: Rolling walker (2 wheeled) Gait Pattern/deviations: Step-through pattern;Decreased stride length Gait velocity: Decreased Gait velocity interpretation: 1.31 - 2.62 ft/sec, indicative of limited community ambulator General Gait  Details: Min guard assist progressing to supervision for safety. No assist required for ambulation and pt demonstrated good self-monitoring of endurance.    Stairs Stairs: Yes Stairs assistance: Min guard Stair Management: One rail Right;Step to pattern;Forwards Number of Stairs: 7 General stair comments: VC's for sequencing and general safety. No assist required.    Wheelchair Mobility    Modified Rankin (Stroke Patients Only)       Balance Overall balance assessment: Needs assistance Sitting-balance support: No upper extremity supported Sitting balance-Leahy Scale: Fair     Standing balance support: Bilateral upper extremity supported Standing balance-Leahy Scale: Poor Standing balance comment: reliant on the RW                            Cognition Arousal/Alertness: Awake/alert Behavior During Therapy: WFL for tasks assessed/performed Overall Cognitive Status: Within Functional Limits for tasks assessed                                        Exercises      General Comments        Pertinent Vitals/Pain Pain Assessment: Faces Faces Pain Scale: Hurts even more Pain Location: incisional Pain Descriptors / Indicators: Sore Pain Intervention(s): Monitored during session    Home Living                      Prior Function            PT Goals (current goals can now be found in the care plan section) Acute Rehab PT Goals Patient Stated Goal: independent PT Goal Formulation: With patient  Time For Goal Achievement: 08/15/18 Potential to Achieve Goals: Good Progress towards PT goals: Progressing toward goals    Frequency    Min 5X/week      PT Plan Current plan remains appropriate    Co-evaluation              AM-PAC PT "6 Clicks" Mobility   Outcome Measure  Help needed turning from your back to your side while in a flat bed without using bedrails?: A Little Help needed moving from lying on your back to  sitting on the side of a flat bed without using bedrails?: A Lot Help needed moving to and from a bed to a chair (including a wheelchair)?: A Little Help needed standing up from a chair using your arms (e.g., wheelchair or bedside chair)?: A Lot Help needed to walk in hospital room?: A Little Help needed climbing 3-5 steps with a railing? : A Lot 6 Click Score: 15    End of Session Equipment Utilized During Treatment: Gait belt;Back brace Activity Tolerance: Patient tolerated treatment well Patient left: in bed;with call bell/phone within reach Nurse Communication: Mobility status PT Visit Diagnosis: Other abnormalities of gait and mobility (R26.89);Pain Pain - part of body: (back)     Time: 5248-1859 PT Time Calculation (min) (ACUTE ONLY): 22 min  Charges:  $Gait Training: 8-22 mins                     Angie [name], PT, DPT Acute Rehabilitation Services Pager: [phone] Office: [phone]    Angie [name] 08/09/2018, 10:22 AM

## 2018-08-09 NOTE — Progress Notes (Addendum)
Subjective: Patient reports "I have a little patch of numbness in this (right) thigh every now and then. No pain"  Objective: Vital signs in last 24 hours: Temp:  [97.4 F (36.3 C)-98.3 F (36.8 C)] 98.1 F (36.7 C) (01/17 0444) Pulse Rate:  [80-95] 83 (01/17 0444) Resp:  [11-20] 16 (01/17 0444) BP: (99-161)/(56-95) 161/66 (01/17 0444) SpO2:  [90 %-100 %] 100 % (01/17 0444)  Intake/Output from previous day: 01/16 0701 - 01/17 0700 In: 3024.1 [I.V.:2674.1; IV Piggyback:350] Out: 430 [Urine:230; Blood:200] Intake/Output this shift: No intake/output data recorded.  Alert, conversant. Lumbar pain mild with activity, but no pain at present. Good strength BLE. Incision flat without erythema or new drainage beneath honeycomb and Dermabond.  Lab Results: No results for input(s): WBC, HGB, HCT, PLT in the last 72 hours. BMET No results for input(s): NA, K, CL, CO2, GLUCOSE, BUN, CREATININE, CALCIUM in the last 72 hours.  Studies/Results: Dg Lumbar Spine 2-3 Views  Result Date: 08/08/2018 CLINICAL DATA:  Lumbar surgery. EXAM: DG C-ARM 61-120 MIN; LUMBAR SPINE - 2-3 VIEW COMPARISON:  Lumbar spine 12/03/2017.  MRI lumbar spine 11/05/2017. FINDINGS: Lumbar spine numbered as per prior MRI. Pedicle screws noted in L4, L5, and S1. These appear to be intact. Interbody fusion L4-L5. Three images obtained. 1 minutes 37 seconds fluoroscopy time. IMPRESSION: Postsurgical changes lumbosacral spine. Electronically Signed   By: Marcello Moores  Register   On: 08/08/2018 11:33   Dg C-arm 1-60 Min  Result Date: 08/08/2018 CLINICAL DATA:  Lumbar surgery. EXAM: DG C-ARM 61-120 MIN; LUMBAR SPINE - 2-3 VIEW COMPARISON:  Lumbar spine 12/03/2017.  MRI lumbar spine 11/05/2017. FINDINGS: Lumbar spine numbered as per prior MRI. Pedicle screws noted in L4, L5, and S1. These appear to be intact. Interbody fusion L4-L5. Three images obtained. 1 minutes 37 seconds fluoroscopy time. IMPRESSION: Postsurgical changes lumbosacral  spine. Electronically Signed   By: Marcello Moores  Register   On: 08/08/2018 11:33    Assessment/Plan: Improving  LOS: 1 day  Continue to mobilize in brace.   Verdis Prime 08/09/2018, 7:24 AM   Patient is doing very well.  Mobilize today.  Anticipate discharge in AM.

## 2018-08-10 MED ORDER — TRAMADOL HCL 50 MG PO TABS: 50.0000 mg | ORAL_TABLET | Freq: Four times a day (QID) | ORAL | 0 refills | Status: DC | PRN

## 2018-08-10 MED ORDER — METHOCARBAMOL 500 MG PO TABS: 500.0000 mg | ORAL_TABLET | Freq: Four times a day (QID) | ORAL | 2 refills | Status: DC | PRN

## 2018-08-10 MED ORDER — DOCUSATE SODIUM 100 MG PO CAPS: 100.0000 mg | ORAL_CAPSULE | Freq: Two times a day (BID) | ORAL | 0 refills | Status: DC

## 2018-08-10 NOTE — Discharge Summary (Signed)
Discharge Summary  Date of Admission: 08/08/2018  Date of Discharge: 08/10/18  Attending Physician: Erline Levine, MD  Hospital Course: Patient was admitted following an uncomplicated C3-8/3-7 decompression and isntrumented fusion. She was recovered in PACU and transferred to the spine recovery unit. Her hospital course was uncomplicated and the patient was discharged home on 08/10/2018. She will follow up in clinic with Dr. Vertell Limber in 2 weeks.  Judith Part, MD 08/10/18 8:20 AM

## 2018-08-10 NOTE — Progress Notes (Signed)
Neurosurgery Service Progress Note  Subjective: No acute events overnight, back pain improving   Objective: Vitals:   08/09/18 2130 08/09/18 2338 08/10/18 0348 08/10/18 0741  BP: (!) 165/75 (!) 144/66 (!) 156/74 95/63  Pulse: 84 78 79 78  Resp: 18 18 18 16   Temp: 99.7 F (37.6 C) 99.7 F (37.6 C) 100.1 F (37.8 C) 97.7 F (36.5 C)  TempSrc: Oral Oral Oral Oral  SpO2: 96% 95% 95% 95%  Weight:      Height:       Temp (24hrs), Avg:99.1 F (37.3 C), Min:97.7 F (36.5 C), Max:100.1 F (37.8 C)  CBC Latest Ref Rng & Units 08/02/2018 05/22/2018 08/16/2012  WBC 4.0 - 10.5 K/uL 10.4 7.5 6.2  Hemoglobin 12.0 - 15.0 g/dL 13.5 14.0 13.1  Hematocrit 36.0 - 46.0 % 41.5 42.5 38.7  Platelets 150 - 400 K/uL 241 242 226   BMP Latest Ref Rng & Units 08/02/2018 05/22/2018 10/13/2016  Glucose 70 - 99 mg/dL 130(H) 100(H) 112(H)  BUN 8 - 23 mg/dL 20 18 12   Creatinine 0.44 - 1.00 mg/dL 0.69 0.79 0.74  BUN/Creat Ratio 12 - 28 - 23 16  Sodium 135 - 145 mmol/L 140 140 143  Potassium 3.5 - 5.1 mmol/L 3.7 4.1 4.0  Chloride 98 - 111 mmol/L 102 98 100  CO2 22 - 32 mmol/L 29 27 29   Calcium 8.9 - 10.3 mg/dL 10.1 10.3 10.2    Intake/Output Summary (Last 24 hours) at 08/10/2018 0165 Last data filed at 08/10/2018 0300 Gross per 24 hour  Intake 1046.75 ml  Output -  Net 1046.75 ml    Current Facility-Administered Medications:  .  0.9 %  sodium chloride infusion, 250 mL, Intravenous, Continuous, Erline Levine, MD .  acetaminophen (TYLENOL) tablet 650 mg, 650 mg, Oral, Q4H PRN **OR** acetaminophen (TYLENOL) suppository 650 mg, 650 mg, Rectal, Q4H PRN, Erline Levine, MD .  alum & mag hydroxide-simeth (MAALOX/MYLANTA) 200-200-20 MG/5ML suspension 30 mL, 30 mL, Oral, Q6H PRN, Erline Levine, MD .  aspirin EC tablet 81 mg, 81 mg, Oral, Daily, Erline Levine, MD, 81 mg at 08/09/18 0946 .  bisacodyl (DULCOLAX) suppository 10 mg, 10 mg, Rectal, Daily PRN, Erline Levine, MD .  cycloSPORINE (RESTASIS) 0.05 %  ophthalmic emulsion 1 drop, 1 drop, Both Eyes, BID, Erline Levine, MD, 1 drop at 08/09/18 2138 .  dextrose 5 % and 0.45 % NaCl with KCl 20 mEq/L infusion, , Intravenous, Continuous, Erline Levine, MD, Stopped at 08/10/18 0002 .  docusate sodium (COLACE) capsule 100 mg, 100 mg, Oral, BID, Erline Levine, MD, 100 mg at 08/09/18 2137 .  gabapentin (NEURONTIN) capsule 300 mg, 300 mg, Oral, TID, Erline Levine, MD, 300 mg at 08/09/18 2137 .  hydrochlorothiazide (HYDRODIURIL) tablet 25 mg, 25 mg, Oral, Daily, Erline Levine, MD, 25 mg at 08/09/18 0947 .  HYDROcodone-acetaminophen (NORCO/VICODIN) 5-325 MG per tablet 2 tablet, 2 tablet, Oral, Q4H PRN, Erline Levine, MD, 2 tablet at 08/10/18 0357 .  hydrOXYzine (VISTARIL) injection 50 mg, 50 mg, Intramuscular, Q6H PRN, Erline Levine, MD, 50 mg at 08/08/18 1829 .  menthol-cetylpyridinium (CEPACOL) lozenge 3 mg, 1 lozenge, Oral, PRN, 3 mg at 08/09/18 1834 **OR** phenol (CHLORASEPTIC) mouth spray 1 spray, 1 spray, Mouth/Throat, PRN, Erline Levine, MD .  methocarbamol (ROBAXIN) tablet 500 mg, 500 mg, Oral, Q6H PRN, 500 mg at 08/09/18 2347 **OR** methocarbamol (ROBAXIN) 500 mg in dextrose 5 % 50 mL IVPB, 500 mg, Intravenous, Q6H PRN, Erline Levine, MD .  morphine 2 MG/ML injection  2 mg, 2 mg, Intravenous, Q2H PRN, Erline Levine, MD .  multivitamin (PROSIGHT) tablet 1 tablet, 1 tablet, Oral, Daily, Erline Levine, MD, 1 tablet at 08/09/18 0946 .  multivitamin with minerals tablet 1 tablet, 1 tablet, Oral, Daily, Erline Levine, MD, 1 tablet at 08/09/18 [phone] .  ondansetron (ZOFRAN) tablet 4 mg, 4 mg, Oral, Q6H PRN, 4 mg at 08/09/18 2023 **OR** ondansetron (ZOFRAN) injection 4 mg, 4 mg, Intravenous, Q6H PRN, Erline Levine, MD .  oxyCODONE (Oxy IR/ROXICODONE) immediate release tablet 5 mg, 5 mg, Oral, Q3H PRN, Erline Levine, MD .  pantoprazole (PROTONIX) EC tablet 80 mg, 80 mg, Oral, Q1200, Erline Levine, MD, 80 mg at 08/09/18 1239 .  polyethylene glycol (MIRALAX /  GLYCOLAX) packet 17 g, 17 g, Oral, Daily PRN, Erline Levine, MD .  pravastatin (PRAVACHOL) tablet 20 mg, 20 mg, Oral, Ninfa Meeker, MD, 20 mg at 08/08/18 1617 .  sodium chloride flush (NS) 0.9 % injection 3 mL, 3 mL, Intravenous, Q12H, Erline Levine, MD, 3 mL at 08/09/18 2138 .  sodium chloride flush (NS) 0.9 % injection 3 mL, 3 mL, Intravenous, PRN, Erline Levine, MD .  sodium phosphate (FLEET) 7-19 GM/118ML enema 1 enema, 1 enema, Rectal, Once PRN, Erline Levine, MD .  SUMAtriptan [facility]) tablet 25 mg, 25 mg, Oral, Q2H PRN, Erline Levine, MD, 25 mg at 08/09/18 0947 .  tiZANidine (ZANAFLEX) tablet 4 mg, 4 mg, Oral, QHS PRN, Erline Levine, MD, 4 mg at 08/10/18 0357 .  traMADol (ULTRAM) tablet 50-100 mg, 50-100 mg, Oral, Q6H PRN, Erline Levine, MD .  traZODone (DESYREL) tablet 25 mg, 25 mg, Oral, QHS, Erline Levine, MD .  valACYclovir Estell Harpin) tablet 500 mg, 500 mg, Oral, Daily, Erline Levine, MD, 500 mg at 08/09/18 0946 .  zolpidem (AMBIEN) tablet 5 mg, 5 mg, Oral, QHS PRN, Erline Levine, MD   Physical Exam: Standing up when I entered the room, ambulating well, brace in place  Assessment & Plan: 74 y.o. woman s/p L4-5/5-1 decompression and instrumented fusion, recovering well. -ambulating, tolerating a regular diet, okay for discharge home this morning  Judith Part  08/10/18 8:21 AM

## 2018-08-10 NOTE — Progress Notes (Signed)
Patient is discharged from room 3C02 at this time. Alert and in stable condition. IV site d/c'd and instructions read to patient and spouse with understanding verbalized. Left unit via wheelchair with all belongings at side 

## 2018-08-10 NOTE — Discharge Instructions (Addendum)
Discharge Instructions Wound Care Leave incision open to air. You may shower. Do not scrub directly on incision.  Do not put any creams, lotions, or ointments on incision. Activity Walk each and every day, increasing distance each day. No lifting greater than 5 lbs.  Avoid bending, arching, and twisting. No driving for 2 weeks; may ride as a passenger locally. If provided with back brace, wear when out of bed.  It is not necessary to wear in bed. Diet Resume your normal diet.  Return to Work Will be discussed at you follow up appointment. Call Your Doctor If Any of These Occur Redness, drainage, or swelling at the wound.  Temperature greater than 101 degrees. Severe pain not relieved by pain medication. Incision starts to come apart. Follow Up Appt Call today for appointment in 3-4 weeks ([phone]) or for problems.  If you have any hardware placed in your spine, you will need an x-ray before your appointment.

## 2018-08-10 NOTE — Progress Notes (Signed)
Occupational Therapy Treatment Patient Details Name: Diksha Reid-White MRN: [id number] DOB: 31-Aug-1945 Today's Date: 08/10/2018    History of present illness Pt is a 73 y/o female with pmh significant for HTN, chronic BP admitted with back pain from degenerative spinal stenosis, spondylolisthesis and radiculopathy admitted for decompression and fusion at L4 to S1.   OT comments  Patient is s/p  For above surgery resulting in the deficits listed below (see OT Problem List). The patient plans to return home with her husband and have him assist with dressing, showering, bed mobility and other needs she may need. This therapist reviewed precautions with the patient and how to don/doff brace with family present. The patient and husband was educated on sequencing for LE dressing to decrease need for assistance.    Follow Up Recommendations  No OT follow up;Supervision/Assistance - 24 hour    Equipment Recommendations       Recommendations for Other Services      Precautions / Restrictions Precautions Precautions: Back Precaution Booklet Issued: Yes (comment) Precaution Comments: able to report 2/3 Required Braces or Orthoses: Spinal Brace Spinal Brace: Lumbar corset;Applied in sitting position Restrictions Weight Bearing Restrictions: No       Mobility Bed Mobility Overal bed mobility: (OOB upon arrival)             General bed mobility comments: pt OOB on arrival  Transfers Overall transfer level: Needs assistance Equipment used: Rolling walker (2 wheeled) Transfers: Sit to/from Stand Sit to Stand: Supervision         General transfer comment: good technique, supervision for safety    Balance Overall balance assessment: Needs assistance Sitting-balance support: No upper extremity supported Sitting balance-Leahy Scale: Good     Standing balance support: No upper extremity supported Standing balance-Leahy Scale: Fair Standing balance comment: reliant on the  RW                           ADL either performed or assessed with clinical judgement   ADL Overall ADL's : Needs assistance/impaired Eating/Feeding: Independent;Sitting   Grooming: Wash/dry hands;Supervision/safety;Standing   Upper Body Bathing: Supervision/ safety;Sitting   Lower Body Bathing: Minimal assistance;Sit to/from stand   Upper Body Dressing : Set up;Sitting Upper Body Dressing Details (indicate cue type and reason): reviewed brace management  Lower Body Dressing: Minimal assistance;Sit to/from stand Lower Body Dressing Details (indicate cue type and reason): pt reports they are going to have husband complete if they can not complete  Toilet Transfer: Supervision/safety;Ambulation;RW Toilet Transfer Details (indicate cue type and reason): needs 3 in 1 commode Toileting- Clothing Manipulation and Hygiene: Supervision/safety;Sit to/from stand   Tub/ Banker: Walk-in shower;Minimal assistance   Functional mobility during ADLs: Higher education careers adviser      Cognition Arousal/Alertness: Awake/alert Behavior During Therapy: WFL for tasks assessed/performed Overall Cognitive Status: Within Functional Limits for tasks assessed                                          Exercises     Shoulder Instructions       General Comments pt reporting husband is going to assist as needed    Pertinent Vitals/ Pain       Pain Assessment: Faces Pain Score: 3  Faces  Pain Scale: Hurts a little bit Pain Location: incisional Pain Descriptors / Indicators: Aching Pain Intervention(s): Limited activity within patient's tolerance  Home Living                                          Prior Functioning/Environment              Frequency           Progress Toward Goals  OT Goals(current goals can now be found in the care plan section)     Acute Rehab OT  Goals Patient Stated Goal: go home OT Goal Formulation: With patient Time For Goal Achievement: 08/23/18 ADL Goals Pt Will Perform Grooming: with modified independence;sitting;standing Pt Will Perform Upper Body Dressing: with modified independence;sitting Pt Will Perform Lower Body Dressing: with min assist;sit to/from stand Pt Will Perform Toileting - Clothing Manipulation and hygiene: with modified independence  Plan Discharge plan remains appropriate    Co-evaluation                 AM-PAC OT "6 Clicks" Daily Activity     Outcome Measure   Help from another person eating meals?: None Help from another person taking care of personal grooming?: None Help from another person toileting, which includes using toliet, bedpan, or urinal?: A Little Help from another person bathing (including washing, rinsing, drying)?: A Little Help from another person to put on and taking off regular upper body clothing?: None Help from another person to put on and taking off regular lower body clothing?: A Little 6 Click Score: 21    End of Session Equipment Utilized During Treatment: Gait belt;Rolling walker;Back brace  OT Visit Diagnosis: Other abnormalities of gait and mobility (R26.89);Unsteadiness on feet (R26.81);Pain Pain - part of body: (back)   Activity Tolerance Patient tolerated treatment well   Patient Left in bed;with family/visitor present   Nurse Communication Mobility status        Time: 7048-8891 OT Time Calculation (min): 18 min  Charges: OT General Charges $OT Visit: 1 Visit OT Treatments $Self Care/Home Management : 8-22 mins  Joeseph Amor OTR/L  Acute Rehab Services  [phone] office number [phone] pager number    Joeseph Amor 08/10/2018, 10:51 AM

## 2018-08-10 NOTE — Progress Notes (Signed)
Physical Therapy Treatment Patient Details Name: Angie [name] MRN: [id number] DOB: [birth year] Today's Date: 08/10/2018    History of Present Illness Pt is a 73 y/o female with pmh significant for HTN, chronic BP admitted with back pain from degenerative spinal stenosis, spondylolisthesis and radiculopathy admitted for decompression and fusion at L4 to S1.    PT Comments    Pt making steady progress with functional mobility. Plan is to d/c home today with family support. PT discussed generalized walking program with pt to initiate upon d/c home. PT answered all questions at end of session.    Follow Up Recommendations  No PT follow up;Supervision/Assistance - 24 hour     Equipment Recommendations  Rolling walker with 5" wheels;3in1 (PT)    Recommendations for Other Services       Precautions / Restrictions Precautions Precautions: Back Precaution Comments: Reviewed precautions verbally; pt able to recall start of session Required Braces or Orthoses: Spinal Brace Spinal Brace: Lumbar corset;Applied in sitting position Restrictions Weight Bearing Restrictions: No    Mobility  Bed Mobility               General bed mobility comments: pt received in bathroom  Transfers Overall transfer level: Needs assistance Equipment used: Rolling walker (2 wheeled) Transfers: Sit to/from Stand Sit to Stand: Supervision         General transfer comment: good technique, supervision for safety  Ambulation/Gait Ambulation/Gait assistance: Supervision Gait Distance (Feet): 20 Feet Assistive device: Rolling walker (2 wheeled) Gait Pattern/deviations: Step-through pattern;Decreased stride length Gait velocity: Decreased   General Gait Details: supervision for safety to ambulate in room (pt stating she would ambulate in hall after she ate breakfast)   Stairs             Wheelchair Mobility    Modified Rankin (Stroke Patients Only)       Balance Overall  balance assessment: Needs assistance Sitting-balance support: No upper extremity supported Sitting balance-Leahy Scale: Good     Standing balance support: No upper extremity supported Standing balance-Leahy Scale: Fair                              Cognition Arousal/Alertness: Awake/alert Behavior During Therapy: WFL for tasks assessed/performed Overall Cognitive Status: Within Functional Limits for tasks assessed                                        Exercises      General Comments        Pertinent Vitals/Pain Pain Assessment: 0-10 Pain Score: 4  Pain Location: incisional Pain Descriptors / Indicators: Sore Pain Intervention(s): Monitored during session;Repositioned    Home Living                      Prior Function            PT Goals (current goals can now be found in the care plan section) Acute Rehab PT Goals PT Goal Formulation: With patient Time For Goal Achievement: 08/15/18 Potential to Achieve Goals: Good Progress towards PT goals: Progressing toward goals    Frequency    Min 5X/week      PT Plan Current plan remains appropriate    Co-evaluation              AM-PAC PT "6 Clicks" Mobility   Outcome Measure  Help needed turning from your back to your side while in a flat bed without using bedrails?: None Help needed moving from lying on your back to sitting on the side of a flat bed without using bedrails?: None Help needed moving to and from a bed to a chair (including a wheelchair)?: None Help needed standing up from a chair using your arms (e.g., wheelchair or bedside chair)?: None Help needed to walk in hospital room?: None Help needed climbing 3-5 steps with a railing? : A Little 6 Click Score: 23    End of Session Equipment Utilized During Treatment: Back brace Activity Tolerance: Patient tolerated treatment well Patient left: in bed;with call bell/phone within reach;Other (comment)(seated  EOB to eat breakfast) Nurse Communication: Mobility status PT Visit Diagnosis: Other abnormalities of gait and mobility (R26.89);Pain Pain - part of body: (back)     Time: 7893-8101 PT Time Calculation (min) (ACUTE ONLY): 17 min  Charges:  $Therapeutic Activity: 8-22 mins                     Sherie Don, PT, DPT  Acute Rehabilitation Services Pager [phone] Office [place] 08/10/2018, 9:19 AM

## 2018-08-13 MED FILL — Heparin Sodium (Porcine) Inj 1000 Unit/ML: INTRAMUSCULAR | Qty: 10 | Status: AC

## 2018-08-13 MED FILL — Sodium Chloride IV Soln 0.9%: INTRAVENOUS | Qty: 1000 | Status: AC

## 2018-09-04 DIAGNOSIS — M545 Low back pain: Secondary | ICD-10-CM | POA: Diagnosis not present

## 2018-09-04 DIAGNOSIS — M48061 Spinal stenosis, lumbar region without neurogenic claudication: Secondary | ICD-10-CM | POA: Diagnosis not present

## 2018-09-04 DIAGNOSIS — Z5189 Encounter for other specified aftercare: Secondary | ICD-10-CM | POA: Diagnosis not present

## 2018-09-04 DIAGNOSIS — M5416 Radiculopathy, lumbar region: Secondary | ICD-10-CM | POA: Diagnosis not present

## 2018-09-04 DIAGNOSIS — M4316 Spondylolisthesis, lumbar region: Secondary | ICD-10-CM | POA: Diagnosis not present

## 2018-09-24 DIAGNOSIS — M1611 Unilateral primary osteoarthritis, right hip: Secondary | ICD-10-CM | POA: Diagnosis not present

## 2018-09-25 DIAGNOSIS — M1611 Unilateral primary osteoarthritis, right hip: Secondary | ICD-10-CM | POA: Diagnosis not present

## 2018-09-25 DIAGNOSIS — M25551 Pain in right hip: Secondary | ICD-10-CM | POA: Diagnosis not present

## 2018-11-15 ENCOUNTER — Other Ambulatory Visit: Payer: Self-pay

## 2018-11-15 ENCOUNTER — Encounter: Payer: Self-pay | Admitting: Family Medicine

## 2018-11-15 ENCOUNTER — Telehealth (INDEPENDENT_AMBULATORY_CARE_PROVIDER_SITE_OTHER): Payer: Medicare Other | Admitting: Family Medicine

## 2018-11-15 ENCOUNTER — Other Ambulatory Visit: Payer: Self-pay | Admitting: Family Medicine

## 2018-11-15 DIAGNOSIS — R7303 Prediabetes: Secondary | ICD-10-CM

## 2018-11-15 DIAGNOSIS — R21 Rash and other nonspecific skin eruption: Secondary | ICD-10-CM

## 2018-11-15 DIAGNOSIS — I10 Essential (primary) hypertension: Secondary | ICD-10-CM

## 2018-11-15 DIAGNOSIS — R7309 Other abnormal glucose: Secondary | ICD-10-CM

## 2018-11-15 MED ORDER — NYSTATIN 100000 UNIT/GM EX POWD: Freq: Three times a day (TID) | CUTANEOUS | 0 refills | Status: DC

## 2018-11-15 NOTE — Addendum Note (Signed)
Addended by: Andrena Mews T on: 11/15/2018 10:58 AM   Modules accepted: Orders

## 2018-11-15 NOTE — Assessment & Plan Note (Signed)
Per patient, looks similar to previous yeast infection. I will treat empirically for candida intertrigo. As discussed with her, if there is no improvement with topical antifungal cream, she will need to come in for an evaluation. She agreed with the plan.

## 2018-11-15 NOTE — Assessment & Plan Note (Signed)
Appointment scheduled for A1C check. Continue diet control.

## 2018-11-15 NOTE — Progress Notes (Signed)
[place] Telemedicine Visit  Patient consented to have virtual visit. Method of visit: Telephone. Technical difficulty with video visit.  Encounter participants: Patient: Angie [name] - located at Home Provider: Andrena Mews - located at Office Others (if applicable): NA  Chief Complaint: Rash/ F/U  HPI:  Rash  This is a new problem. Episode onset: 1 month. The problem has been gradually worsening since onset. The affected locations include the right axilla (Under her right arm, it is red, itchy and at times it burns). The rash is characterized by burning, itchiness and redness. She was exposed to nothing. Pertinent negatives include no fever. Treatments tried: Keep clean and dry. The treatment provided mild relief.  HTN/Pre-DM2:Compliant with HCTZ 25 mg qd and diet. Home BP has been good. She has not checked her BP today.   ROS: per HPI  Pertinent PMHx: Problem list reviewed.  Exam:  Respiratory: No respiratory distress  Assessment/Plan:  Rash and nonspecific skin eruption Per patient, looks similar to previous yeast infection. I will treat empirically for candida intertrigo. As discussed with her, if there is no improvement with topical antifungal cream, she will need to come in for an evaluation. She agreed with the plan.  HYPERTENSION, BENIGN SYSTEMIC Advised to document BP readings for the next few days and get them across to me. She agreed with the plan.  Pre-diabetes Appointment scheduled for A1C check. Continue diet control.    Time spent during visit with patient: 12 minutes

## 2018-11-15 NOTE — Progress Notes (Signed)
a1c

## 2018-11-15 NOTE — Assessment & Plan Note (Signed)
Advised to document BP readings for the next few days and get them across to me. She agreed with the plan.

## 2018-11-19 ENCOUNTER — Other Ambulatory Visit: Payer: Medicare Other

## 2018-11-19 ENCOUNTER — Other Ambulatory Visit: Payer: Self-pay

## 2018-11-19 DIAGNOSIS — R7303 Prediabetes: Secondary | ICD-10-CM

## 2018-11-20 ENCOUNTER — Other Ambulatory Visit: Payer: Self-pay | Admitting: *Deleted

## 2018-11-20 LAB — HEMOGLOBIN A1C
Est. average glucose Bld gHb Est-mCnc: 126 mg/dL
Hgb A1c MFr Bld: 6 % — ABNORMAL HIGH (ref 4.8–5.6)

## 2018-11-20 MED ORDER — CLOBETASOL PROPIONATE 0.05 % EX CREA: 1.0000 "application " | TOPICAL_CREAM | Freq: Two times a day (BID) | CUTANEOUS | 1 refills | Status: DC

## 2018-11-20 NOTE — Telephone Encounter (Signed)
-----   Message from [name], MD sent at 11/20/2018  9:29 AM EDT ----- Please inform patient that her A1C increased from 5.6 to 6.0. She is still in the prediabetic range, I.e she does not carry the diagnosis of diabetes with that result. Continue diet adjustment, f/u in 3-6 months.

## 2018-11-20 NOTE — Telephone Encounter (Signed)
Patient is aware of results and will continue to work on diet and exercise.  She did state that she needs a refill of her clobetasol cream.  She didn't remember discussing this when spoke with provider.  Refill request sent to provider. Lajuana Patchell,CMA

## 2018-11-22 ENCOUNTER — Other Ambulatory Visit: Payer: Self-pay | Admitting: Family Medicine

## 2018-11-22 ENCOUNTER — Telehealth: Payer: Self-pay

## 2018-11-22 MED ORDER — CLOBETASOL PROPIONATE 0.05 % EX CREA: 1.0000 "application " | TOPICAL_CREAM | Freq: Two times a day (BID) | CUTANEOUS | 1 refills | Status: DC

## 2018-11-22 NOTE — Telephone Encounter (Signed)
Pt informed. Sharon T Saunders, CMA  

## 2018-11-22 NOTE — Telephone Encounter (Signed)
Pt calling to have Dr. Gwendlyn Deutscher resend Cl[facility]ol cream to [facility] of [facility]. The Rx will be free with the [place]. Ottis Stain, CMA

## 2018-11-22 NOTE — Telephone Encounter (Signed)
Done

## 2018-12-10 IMAGING — MR MR LUMBAR SPINE W/O CM
4 of 5 series · 26 of 48 positions shown · non-contrast
Comparison: None.

CLINICAL DATA: Increasing back pain for 15 years. BILATERAL leg
pain.

EXAM:
MRI LUMBAR SPINE WITHOUT CONTRAST
TECHNIQUE: Multiplanar, multisequence MR imaging of the lumbar spine was
performed. No intravenous contrast was administered.

[Series 5: T1 · sagittal · 4.0mm · 0.55mm/px · 6 of 13 slices shown (1 of 2)]
[im 1/13]
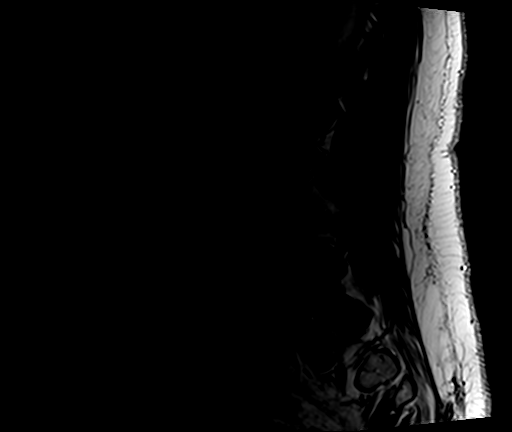
[im 3/13]
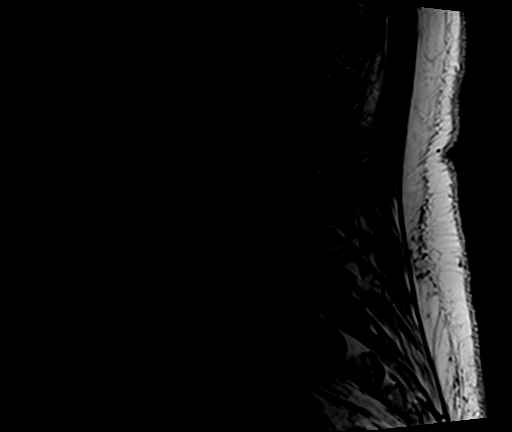
[im 5/13]
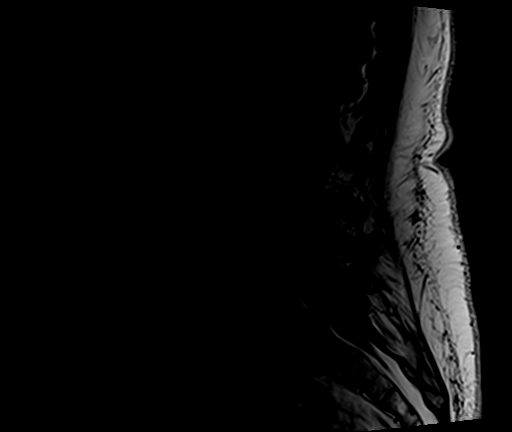
[im 8/13]
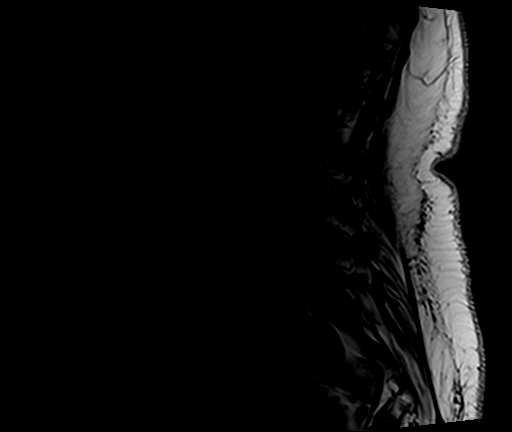
[im 10/13]
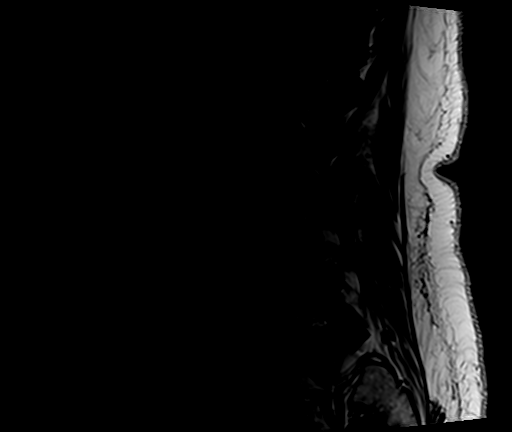
[im 13/13]
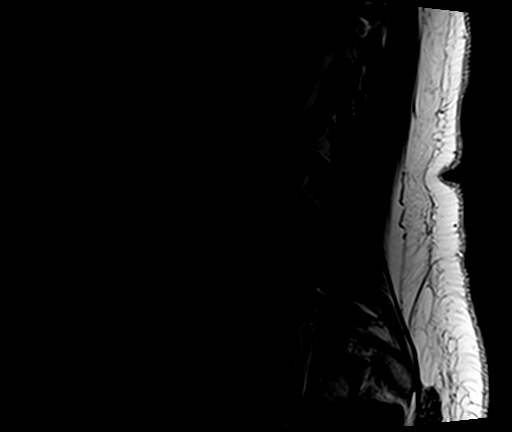

[Series 6: T1 · axial · 4.0mm · 0.35mm/px · z∈[-30,+147]mm · 5 of 35 slices shown (2 of 2)]
[im 1/35]
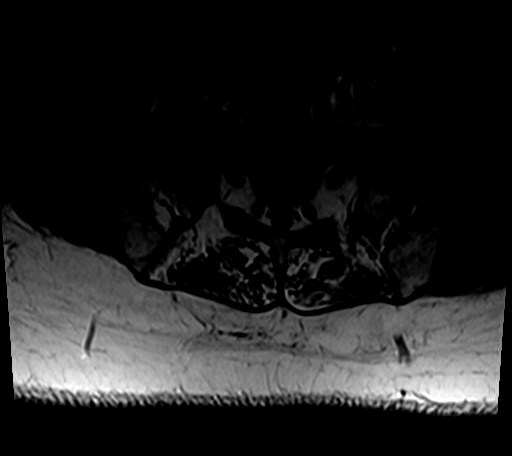
[im 5/35]
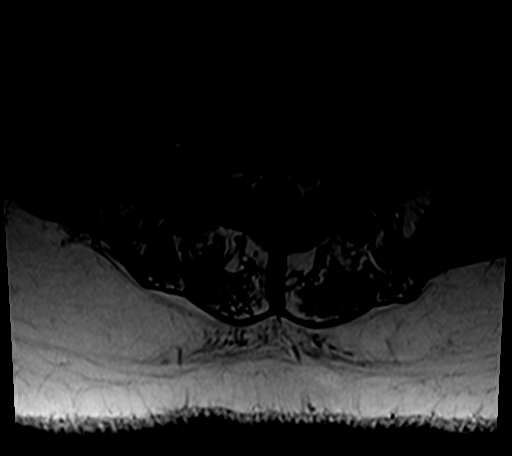
[im 10/35]
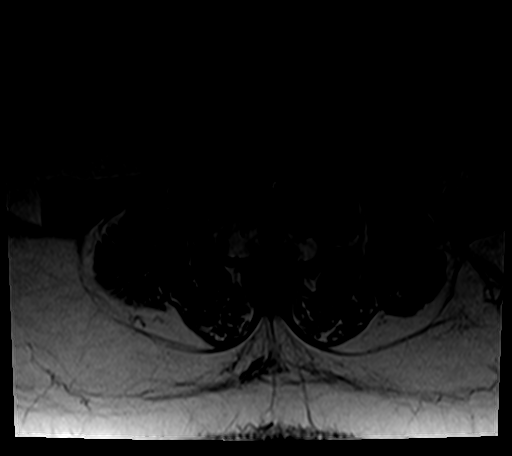
[im 18/35]
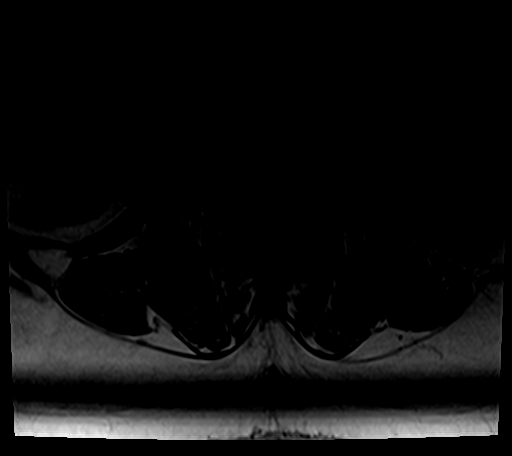
[im 30/35]
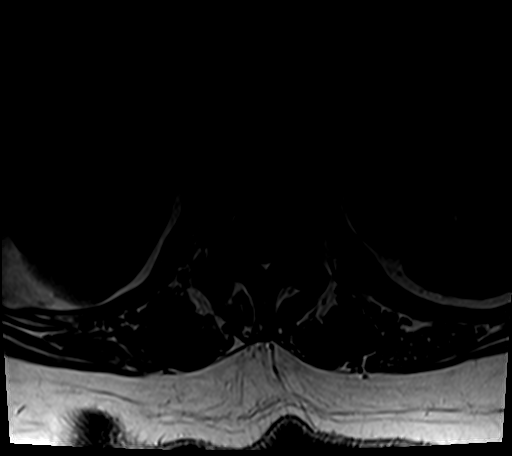

[Series 8: T2 · sagittal · 4.0mm · 0.45mm/px · 6 of 13 slices shown (1 of 2)]
[im 1/13]
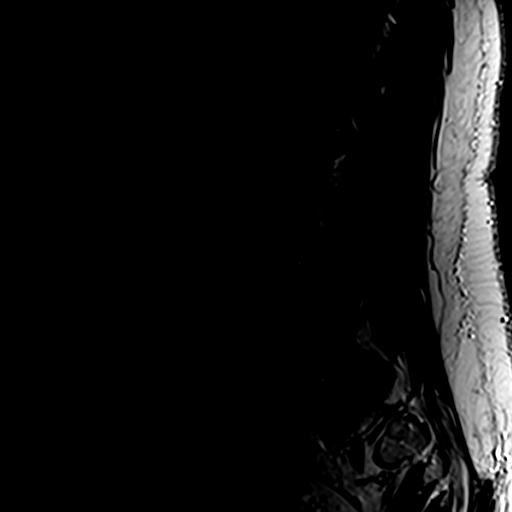
[im 3/13]
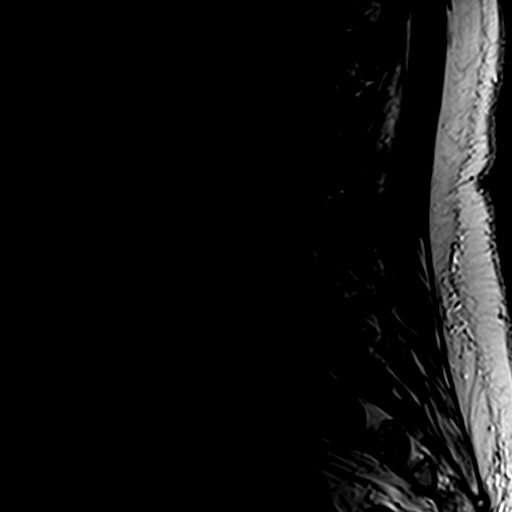
[im 5/13]
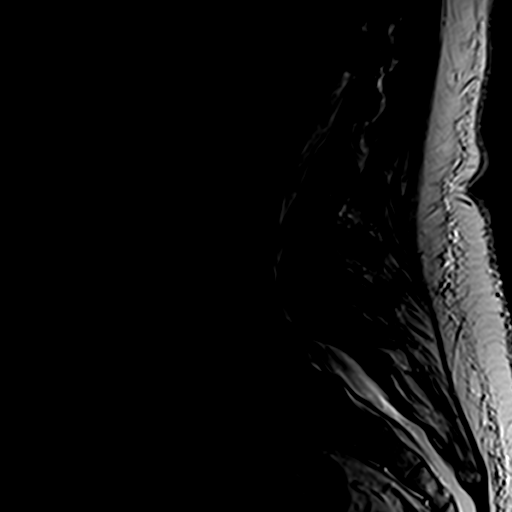
[im 8/13]
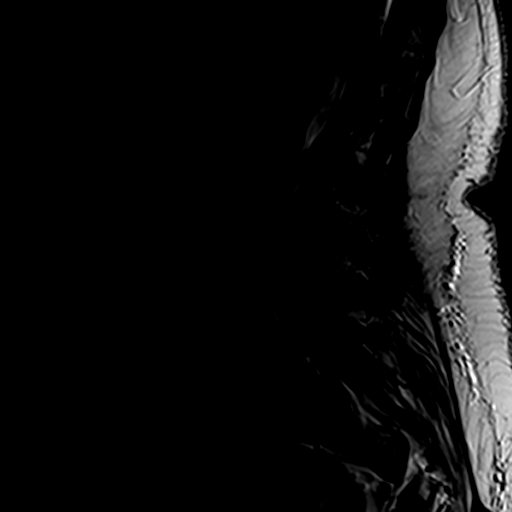
[im 10/13]
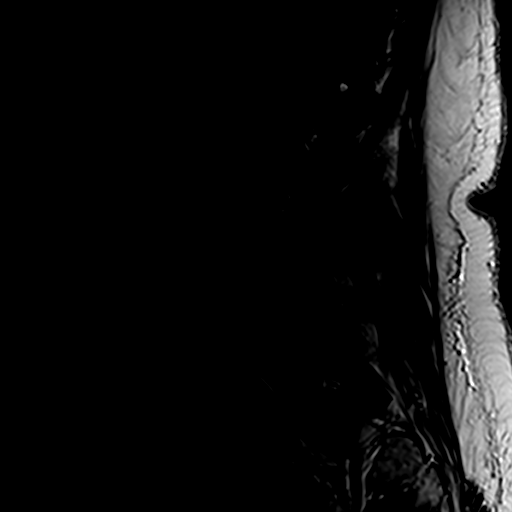
[im 13/13]
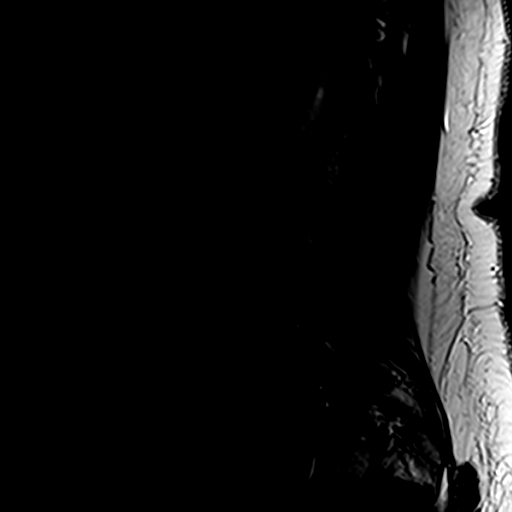

[Series 9: T2 · axial · 4.0mm · 0.70mm/px · z∈[-30,+173]mm · 9 of 35 slices shown (2 of 2)]
[im 1/35]
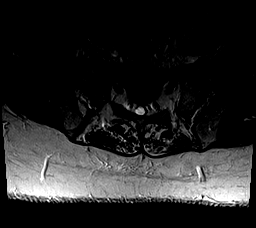
[im 5/35]
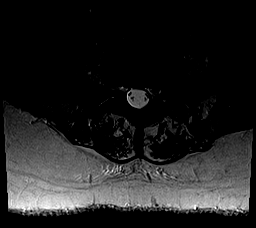
[im 10/35]
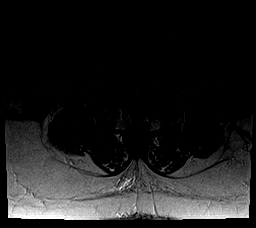
[im 15/35]
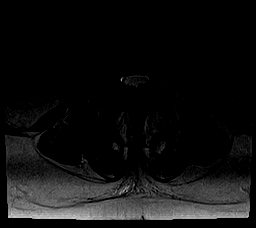
[im 18/35]
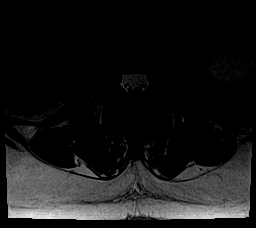
[im 20/35]
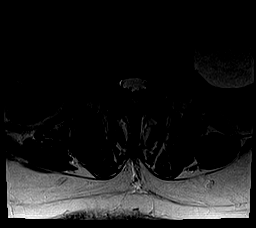
[im 25/35]
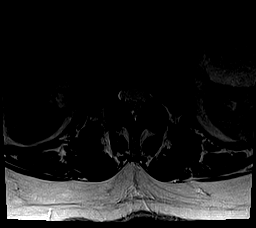
[im 30/35]
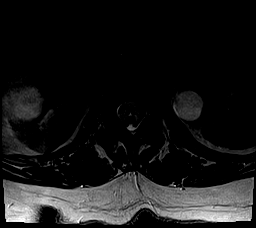
[im 35/35]
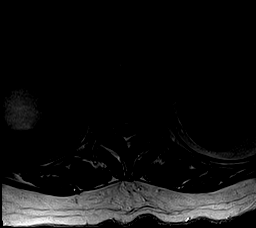

[26 of 48 positions shown; findings below may reference images not displayed]

FINDINGS: Segmentation:  Standard.

Alignment:  6 mm anterolisthesis L4-5.  2 mm anterolisthesis L5-S1.

Vertebrae:  No worrisome osseous lesion.

Conus medullaris and cauda equina: Conus extends to the L1 level.
Conus and cauda equina appear normal.

Paraspinal and other soft tissues: Unremarkable. Renal cystic
disease, incompletely evaluated.

Disc levels:

T12-L1: Slight retrolisthesis. Annular bulge. Facet arthropathy. No
impingement.

L1-L2:  Normal disc space.  Mild facet arthropathy.  No impingement.

L2-L3:  Normal disc space.  Facet arthropathy.  No impingement.

L3-L4: Good disc height and hydration. Slight annular bulge.
Posterior element hypertrophy. No impingement.

L4-L5: 6 mm anterolisthesis. Disc space narrowing with uncovering of
the disc. Annular bulge is superimposed. Marked posterior element
hypertrophy. Severe stenosis. Significant subarticular zone and
foraminal zone narrowing. BILATERAL L4 and L5 nerve root impingement
are observed.

L5-S1: 2 mm anterolisthesis. Disc height is preserved. Annular
bulge/uncovering of the disc. Facet arthropathy and ligamentum
flavum hypertrophy. Borderline subarticular zone and foraminal zone
narrowing does not clearly affect the L5 and S1 nerve roots.
IMPRESSION: The dominant abnormality is at L4-5 where 6 mm facet mediated
anterolisthesis, marked posterior element hypertrophy, in
conjunction with disc space narrowing, annular bulge, and uncovering
of the disc results in severe stenosis with BILATERAL L4 and L5
nerve root impingement. Consider upright flexion extension views for
further evaluation, to evaluate for dynamic instability.

2 mm slip L5-S1 with annular bulge, and posterior element
hypertrophy without definite compressive lesion.

## 2018-12-17 ENCOUNTER — Other Ambulatory Visit: Payer: Self-pay | Admitting: *Deleted

## 2018-12-17 MED ORDER — VALACYCLOVIR HCL 500 MG PO TABS: 500.0000 mg | ORAL_TABLET | Freq: Every day | ORAL | 1 refills | Status: DC

## 2018-12-17 MED ORDER — HYDROCHLOROTHIAZIDE 25 MG PO TABS: 25.0000 mg | ORAL_TABLET | Freq: Every day | ORAL | 1 refills | Status: DC

## 2018-12-17 MED ORDER — MONTELUKAST SODIUM 10 MG PO TABS: 10.0000 mg | ORAL_TABLET | Freq: Every day | ORAL | 1 refills | Status: DC

## 2018-12-17 MED ORDER — ESOMEPRAZOLE MAGNESIUM 20 MG PO CPDR: 20.0000 mg | DELAYED_RELEASE_CAPSULE | Freq: Every day | ORAL | 1 refills | Status: DC | PRN

## 2018-12-17 MED ORDER — GABAPENTIN 300 MG PO CAPS: 300.0000 mg | ORAL_CAPSULE | Freq: Three times a day (TID) | ORAL | 1 refills | Status: DC

## 2018-12-26 DIAGNOSIS — M1611 Unilateral primary osteoarthritis, right hip: Secondary | ICD-10-CM | POA: Diagnosis not present

## 2018-12-26 DIAGNOSIS — M25551 Pain in right hip: Secondary | ICD-10-CM | POA: Diagnosis not present

## 2019-02-11 ENCOUNTER — Other Ambulatory Visit: Payer: Self-pay | Admitting: *Deleted

## 2019-02-11 DIAGNOSIS — Z20822 Contact with and (suspected) exposure to covid-19: Secondary | ICD-10-CM

## 2019-02-12 DIAGNOSIS — M545 Low back pain: Secondary | ICD-10-CM | POA: Diagnosis not present

## 2019-02-12 DIAGNOSIS — M5416 Radiculopathy, lumbar region: Secondary | ICD-10-CM | POA: Diagnosis not present

## 2019-02-12 DIAGNOSIS — M4316 Spondylolisthesis, lumbar region: Secondary | ICD-10-CM | POA: Diagnosis not present

## 2019-02-12 DIAGNOSIS — Z6837 Body mass index (BMI) 37.0-37.9, adult: Secondary | ICD-10-CM | POA: Diagnosis not present

## 2019-02-12 DIAGNOSIS — M48061 Spinal stenosis, lumbar region without neurogenic claudication: Secondary | ICD-10-CM | POA: Diagnosis not present

## 2019-02-12 DIAGNOSIS — I10 Essential (primary) hypertension: Secondary | ICD-10-CM | POA: Diagnosis not present

## 2019-02-15 LAB — NOVEL CORONAVIRUS, NAA: SARS-CoV-2, NAA: NOT DETECTED

## 2019-02-24 ENCOUNTER — Other Ambulatory Visit: Payer: Self-pay | Admitting: Family Medicine

## 2019-02-24 DIAGNOSIS — Z1231 Encounter for screening mammogram for malignant neoplasm of breast: Secondary | ICD-10-CM

## 2019-02-25 ENCOUNTER — Other Ambulatory Visit: Payer: Self-pay

## 2019-02-25 ENCOUNTER — Ambulatory Visit
Admission: RE | Admit: 2019-02-25 | Discharge: 2019-02-25 | Disposition: A | Discharge status: Home / Self Care | Source: Ambulatory Visit | Attending: Family Medicine | Admitting: Family Medicine

## 2019-02-25 DIAGNOSIS — Z1231 Encounter for screening mammogram for malignant neoplasm of breast: Secondary | ICD-10-CM

## 2019-03-21 ENCOUNTER — Other Ambulatory Visit: Payer: Self-pay | Admitting: Family Medicine

## 2019-03-21 ENCOUNTER — Telehealth: Payer: Self-pay | Admitting: *Deleted

## 2019-03-21 MED ORDER — MECLIZINE HCL 12.5 MG PO TABS: 12.5000 mg | ORAL_TABLET | Freq: Three times a day (TID) | ORAL | 0 refills | Status: DC | PRN

## 2019-03-21 NOTE — Telephone Encounter (Signed)
Pt is requesting a refill of meclizine ( not on current med list). Christen Bame, CMA

## 2019-03-21 NOTE — Telephone Encounter (Signed)
I will escribe few tablets

## 2019-03-21 NOTE — Telephone Encounter (Signed)
Patient states that her vertigo came back this morning and became worse as she moved around.  She did start to experience some nausea but has since gone away due to resting.  Patient is agreeable to an appt and would like to know if she can get something to get her through the weekend.  Jazmin Hartsell,CMA

## 2019-03-21 NOTE — Telephone Encounter (Signed)
Patient informed.  Karine Garn,CMA  

## 2019-03-21 NOTE — Telephone Encounter (Signed)
It will be nice to know her symptoms and be able to examine her to determine what her diagnosis and treatment are.  I know she has hx of Vertigo. However, I will recommend an appointment for an assessment. Encourage ED visit if symptoms worsens.

## 2019-03-25 ENCOUNTER — Other Ambulatory Visit: Payer: Self-pay

## 2019-03-25 ENCOUNTER — Ambulatory Visit (INDEPENDENT_AMBULATORY_CARE_PROVIDER_SITE_OTHER): Payer: Medicare Other | Admitting: Family Medicine

## 2019-03-25 ENCOUNTER — Encounter: Payer: Self-pay | Admitting: Family Medicine

## 2019-03-25 VITALS — BP 124/76 | HR 74 | Wt 209.0 lb

## 2019-03-25 DIAGNOSIS — R7309 Other abnormal glucose: Secondary | ICD-10-CM

## 2019-03-25 DIAGNOSIS — H8110 Benign paroxysmal vertigo, unspecified ear: Secondary | ICD-10-CM

## 2019-03-25 DIAGNOSIS — N951 Menopausal and female climacteric states: Secondary | ICD-10-CM

## 2019-03-25 HISTORY — DX: Benign paroxysmal vertigo, unspecified ear: H81.10

## 2019-03-25 LAB — POCT GLYCOSYLATED HEMOGLOBIN (HGB A1C): Hemoglobin A1C: 6 % — AB (ref 4.0–5.6)

## 2019-03-25 MED ORDER — PREGABALIN 75 MG PO CAPS: 75.0000 mg | ORAL_CAPSULE | Freq: Two times a day (BID) | ORAL | 1 refills | Status: DC

## 2019-03-25 NOTE — Assessment & Plan Note (Signed)
No neuro deficit. Gait fine. Improved with Meclizine. Will refill prn. S/E of meds discussed with her.

## 2019-03-25 NOTE — Progress Notes (Signed)
Subjective:     Patient ID: Angie [name], female   DOB: 1946/01/09, 73 y.o.   MRN: [id number]  Dizziness This is a recurrent problem. Episode onset: She has had this for years. the most recent one was few days ago, and before that it was 10 years ago. The problem occurs intermittently. The problem has been rapidly improving (She took three doses of Meclizine and it resolved). Associated symptoms include vertigo. Pertinent negatives include no chest pain, fever, headaches, nausea, numbness, visual change, vomiting or weakness. Exacerbated by: She was in the kitchen and she turned from one counter to the next and her head started spinning and she felt nauseous. Treatments tried: Meclizine. The treatment provided significant relief.   Hot flashes: still sweaty and hot, worsening since May 2020. She is compliant with Gabapentin 300 mg BID.  Current Outpatient Medications on File Prior to Visit  Medication Sig Dispense Refill  . Biotin 10000 MCG TABS Take 1 tablet by mouth daily.    . clobetasol cream (TEMOVATE) [id number] % Apply 1 application topically 2 (two) times daily. 60 g 1  . cycloSPORINE (RESTASIS) 0.05 % ophthalmic emulsion Place 1 drop into both eyes 2 (two) times daily.    Marland Kitchen esomeprazole (NEXIUM) 20 MG capsule Take 1 capsule (20 mg total) by mouth daily as needed. 90 capsule 1  . gabapentin (NEURONTIN) 300 MG capsule Take 1 capsule (300 mg total) by mouth 3 (three) times daily. 270 capsule 1  . hydrochlorothiazide (HYDRODIURIL) 25 MG tablet Take 1 tablet (25 mg total) by mouth daily. 90 tablet 1  . meclizine (ANTIVERT) 12.5 MG tablet Take 1 tablet (12.5 mg total) by mouth 3 (three) times daily as needed for dizziness. 15 tablet 0  . montelukast (SINGULAIR) 10 MG tablet Take 1 tablet (10 mg total) by mouth at bedtime. 90 tablet 1  . Multiple Vitamin (MULTIVITAMIN) capsule Take 1 capsule by mouth daily.    . multivitamin-lutein (OCUVITE-LUTEIN) CAPS capsule Take 1 capsule by mouth daily.     . Olopatadine HCl 0.6 % SOLN Place 2 drops 2 (two) times daily into the nose. 3 Bottle 1  . OVER THE COUNTER MEDICATION Take 1 tablet by mouth daily. Calcium 500mg , Magnesium 500mg , Potassium 99mg     . phenylephrine (SUDAFED PE) 10 MG TABS tablet Take 10 mg by mouth every 6 (six) hours as needed (congestion).    . pravastatin (PRAVACHOL) 20 MG tablet Take 1 tablet (20 mg total) by mouth every other day. 45 tablet 1  . traMADol (ULTRAM) 50 MG tablet Take 1-2 tablets (50-100 mg total) by mouth every 6 (six) hours as needed (pain). 30 tablet 0  . traZODone (DESYREL) 25 mg TABS tablet Take 25 mg by mouth at bedtime.    . valACYclovir (VALTREX) 500 MG tablet Take 1 tablet (500 mg total) by mouth daily. 90 tablet 1  . docusate sodium (COLACE) 100 MG capsule Take 1 capsule (100 mg total) by mouth 2 (two) times daily. 10 capsule 0  . nystatin (MYCOSTATIN/NYSTOP) powder Apply topically 3 (three) times daily. 15 g 0  . OVER THE COUNTER MEDICATION Take 60 mLs by mouth as needed (hip pain).    . SUMAtriptan (IMITREX) 25 MG tablet take one tablet at start of headache. May repeat in 2 hours x 1 if needed (Patient not taking: Reported on 11/15/2018) 15 tablet 2  . tiZANidine (ZANAFLEX) 4 MG tablet Take 4 mg by mouth at bedtime as needed for muscle spasms.     No  current facility-administered medications on file prior to visit.    Past Medical History:  Diagnosis Date  . Arthritis   . Chronic back pain   . Diverticulosis   . GERD (gastroesophageal reflux disease)   . Herpes zoster   . Hyperlipidemia   . Hypertension   . Migraine   . Palpitations 09/08/2014  . Pneumonia   . 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  Never received treatment.  No signs of active disease and low risk for reactivation.  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  Patient declines referral to health department to discuss at this time.    Vitals:   03/25/19 1053  BP: 124/76  Pulse: 74  SpO2: 96%  Weight:  209 lb (94.8 kg)    BP: 126/74, HR: 78 (laying down) BP: 124/76, HR: 74 ( Sitting) BP: 124/82, HR: 88 (standing)  Review of Systems  Constitutional: Negative for fever.  Cardiovascular: Negative for chest pain.  Gastrointestinal: Negative for nausea and vomiting.  Neurological: Positive for dizziness and vertigo. Negative for weakness, numbness and headaches.  All other systems reviewed and are negative.      Objective:   Physical Exam Vitals signs and nursing note reviewed.  Cardiovascular:     Rate and Rhythm: Normal rate and regular rhythm.     Pulses: Normal pulses.     Heart sounds: Normal heart sounds. No murmur.  Pulmonary:     Effort: Pulmonary effort is normal. No respiratory distress.     Breath sounds: Normal breath sounds. No wheezing or rhonchi.  Abdominal:     General: Abdomen is flat. Bowel sounds are normal. There is no distension.     Palpations: Abdomen is soft. There is no mass.     Tenderness: There is no abdominal tenderness.  Neurological:     Mental Status: She is alert.        Assessment:     Vertigo Hot flashes     Plan:     Check problem list

## 2019-03-25 NOTE — Assessment & Plan Note (Signed)
Poor response to Gabapentin. I discussed initiating estrogen plus progesterone ( cancer risk still there but low with addition of progesterone) Increase Gabapentin dose or switch to Lyrica. She prefers trial of Lyrica instead. D/C Gabapentin.

## 2019-03-25 NOTE — Patient Instructions (Signed)
Menopause Menopause is the normal time of life when menstrual periods stop completely. It is usually confirmed by 12 months without a menstrual period. The transition to menopause (perimenopause) most often happens between the ages of 45 and 55. During perimenopause, hormone levels change in your body, which can cause symptoms and affect your health. Menopause may increase your risk for:  Loss of bone (osteoporosis), which causes bone breaks (fractures).  Depression.  Hardening and narrowing of the arteries (atherosclerosis), which can cause heart attacks and strokes. What are the causes? This condition is usually caused by a natural change in hormone levels that happens as you get older. The condition may also be caused by surgery to remove both ovaries (bilateral oophorectomy). What increases the risk? This condition is more likely to start at an earlier age if you have certain medical conditions or treatments, including:  A tumor of the pituitary gland in the brain.  A disease that affects the ovaries and hormone production.  Radiation treatment for cancer.  Certain cancer treatments, such as chemotherapy or hormone (anti-estrogen) therapy.  Heavy smoking and excessive alcohol use.  Family history of early menopause. This condition is also more likely to develop earlier in women who are very thin. What are the signs or symptoms? Symptoms of this condition include:  Hot flashes.  Irregular menstrual periods.  Night sweats.  Changes in feelings about sex. This could be a decrease in sex drive or an increased comfort around your sexuality.  Vaginal dryness and thinning of the vaginal walls. This may cause painful intercourse.  Dryness of the skin and development of wrinkles.  Headaches.  Problems sleeping (insomnia).  Mood swings or irritability.  Memory problems.  Weight gain.  Hair growth on the face and chest.  Bladder infections or problems with urinating. How  is this diagnosed? This condition is diagnosed based on your medical history, a physical exam, your age, your menstrual history, and your symptoms. Hormone tests may also be done. How is this treated? In some cases, no treatment is needed. You and your health care provider should make a decision together about whether treatment is necessary. Treatment will be based on your individual condition and preferences. Treatment for this condition focuses on managing symptoms. Treatment may include:  Menopausal hormone therapy (MHT).  Medicines to treat specific symptoms or complications.  Acupuncture.  Vitamin or herbal supplements. Before starting treatment, make sure to let your health care provider know if you have a personal or family history of:  Heart disease.  Breast cancer.  Blood clots.  Diabetes.  Osteoporosis. Follow these instructions at home: Lifestyle  Do not use any products that contain nicotine or tobacco, such as cigarettes and e-cigarettes. If you need help quitting, ask your health care provider.  Get at least 30 minutes of physical activity on 5 or more days each week.  Avoid alcoholic and caffeinated beverages, as well as spicy foods. This may help prevent hot flashes.  Get 7-8 hours of sleep each night.  If you have hot flashes, try: ? Dressing in layers. ? Avoiding things that may trigger hot flashes, such as spicy food, warm places, or stress. ? Taking slow, deep breaths when a hot flash starts. ? Keeping a fan in your home and office.  Find ways to manage stress, such as deep breathing, meditation, or journaling.  Consider going to group therapy with other women who are having menopause symptoms. Ask your health care provider about recommended group therapy meetings. Eating and   drinking  Eat a healthy, balanced diet that contains whole grains, lean protein, low-fat dairy, and plenty of fruits and vegetables.  Your health care provider may recommend  adding more soy to your diet. Foods that contain soy include tofu, tempeh, and soy milk.  Eat plenty of foods that contain calcium and vitamin D for bone health. Items that are rich in calcium include low-fat milk, yogurt, beans, almonds, sardines, broccoli, and kale. Medicines  Take over-the-counter and prescription medicines only as told by your health care provider.  Talk with your health care provider before starting any herbal supplements. If prescribed, take vitamins and supplements as told by your health care provider. These may include: ? Calcium. Women age 51 and older should get 1,200 mg (milligrams) of calcium every day. ? Vitamin D. Women need 600-800 International Units of vitamin D each day. ? Vitamins B12 and B6. Aim for 50 micrograms of B12 and 1.5 mg of B6 each day. General instructions  Keep track of your menstrual periods, including: ? When they occur. ? How heavy they are and how long they last. ? How much time passes between periods.  Keep track of your symptoms, noting when they start, how often you have them, and how long they last.  Use vaginal lubricants or moisturizers to help with vaginal dryness and improve comfort during sex.  Keep all follow-up visits as told by your health care provider. This is important. This includes any group therapy or counseling. Contact a health care provider if:  You are still having menstrual periods after age 55.  You have pain during sex.  You have not had a period for 12 months and you develop vaginal bleeding. Get help right away if:  You have: ? Severe depression. ? Excessive vaginal bleeding. ? Pain when you urinate. ? A fast or irregular heart beat (palpitations). ? Severe headaches. ? Abdomen (abdominal) pain or severe indigestion.  You fell and you think you have a broken bone.  You develop leg or chest pain.  You develop vision problems.  You feel a lump in your breast. Summary  Menopause is the normal  time of life when menstrual periods stop completely. It is usually confirmed by 12 months without a menstrual period.  The transition to menopause (perimenopause) most often happens between the ages of 45 and 55.  Symptoms can be managed through medicines, lifestyle changes, and complementary therapies such as acupuncture.  Eat a balanced diet that is rich in nutrients to promote bone health and heart health and to manage symptoms during menopause. This information is not intended to replace advice given to you by your health care provider. Make sure you discuss any questions you have with your health care provider. Document Released: 09/30/2003 Document Revised: 06/22/2017 Document Reviewed: 08/12/2016 Elsevier Patient Education  2020 [employer]  

## 2019-04-16 ENCOUNTER — Other Ambulatory Visit: Payer: Self-pay | Admitting: *Deleted

## 2019-04-16 MED ORDER — VALACYCLOVIR HCL 500 MG PO TABS: 500.0000 mg | ORAL_TABLET | Freq: Every day | ORAL | 3 refills | Status: DC

## 2019-04-16 MED ORDER — ESOMEPRAZOLE MAGNESIUM 20 MG PO CPDR: 20.0000 mg | DELAYED_RELEASE_CAPSULE | Freq: Every day | ORAL | 3 refills | Status: DC | PRN

## 2019-04-16 MED ORDER — HYDROCHLOROTHIAZIDE 25 MG PO TABS: 25.0000 mg | ORAL_TABLET | Freq: Every day | ORAL | 3 refills | Status: DC

## 2019-04-18 MED ORDER — VALACYCLOVIR HCL 500 MG PO TABS: 500.0000 mg | ORAL_TABLET | Freq: Every day | ORAL | 3 refills | Status: DC

## 2019-04-18 MED ORDER — ESOMEPRAZOLE MAGNESIUM 20 MG PO CPDR: 20.0000 mg | DELAYED_RELEASE_CAPSULE | Freq: Every day | ORAL | 3 refills | Status: DC | PRN

## 2019-04-18 MED ORDER — HYDROCHLOROTHIAZIDE 25 MG PO TABS: 25.0000 mg | ORAL_TABLET | Freq: Every day | ORAL | 3 refills | Status: DC

## 2019-04-18 NOTE — Telephone Encounter (Signed)
Patient called nurse line stating her medications were sent to the wrong pharmacy. Patient is using the [facility] now. I called [facility] and cancelled prescriptions and resent to the [place]. Updated preferred pharmacy.

## 2019-04-18 NOTE — Addendum Note (Signed)
Addended by: Dorna Bloom on: 04/18/2019 11:09 AM   Modules accepted: Orders

## 2019-06-03 ENCOUNTER — Other Ambulatory Visit: Payer: Self-pay | Admitting: Family Medicine

## 2019-06-03 DIAGNOSIS — E669 Obesity, unspecified: Secondary | ICD-10-CM

## 2019-06-03 DIAGNOSIS — R7303 Prediabetes: Secondary | ICD-10-CM

## 2019-06-03 DIAGNOSIS — I10 Essential (primary) hypertension: Secondary | ICD-10-CM

## 2019-06-06 ENCOUNTER — Ambulatory Visit: Payer: Self-pay

## 2019-06-09 NOTE — Chronic Care Management (AMB) (Signed)
Care Management   Initial Visit Note  06/09/2019 Name: Angie [name] MRN: [id number] DOB: Oct 13, 1945   Assessment: Angie [name] is a 73 y.o. year old female who sees [name], MD for primary care. The care management team was consulted for assistance with care management and care coordination needs related to Disease Management Educational Needs.   Review of patient status, including review of consultants reports, relevant laboratory and other test results, and collaboration with appropriate care team members and the patient's provider was performed as part of comprehensive patient evaluation and provision of care management services.    SDOH (Social Determinants of Health) screening performed today: None. See Care Plan for related entries.    Outpatient Encounter Medications as of 06/06/2019  Medication Sig Note  . Biotin 10000 MCG TABS Take 1 tablet by mouth daily.   . clobetasol cream (TEMOVATE) [id number] % Apply 1 application topically 2 (two) times daily. 06/06/2019: Just as prn   . cycloSPORINE (RESTASIS) 0.05 % ophthalmic emulsion Place 1 drop into both eyes 2 (two) times daily. 06/06/2019: Bid daily   . esomeprazole (NEXIUM) 20 MG capsule Take 1 capsule (20 mg total) by mouth daily as needed.   . hydrochlorothiazide (HYDRODIURIL) 25 MG tablet Take 1 tablet (25 mg total) by mouth daily.   . meclizine (ANTIVERT) 12.5 MG tablet Take 1 tablet (12.5 mg total) by mouth 3 (three) times daily as needed for dizziness.   . montelukast (SINGULAIR) 10 MG tablet Take 1 tablet (10 mg total) by mouth at bedtime.   . Multiple Vitamin (MULTIVITAMIN) capsule Take 1 capsule by mouth daily.   . multivitamin-lutein (OCUVITE-LUTEIN) CAPS capsule Take 1 capsule by mouth daily.   . Olopatadine HCl 0.6 % SOLN Place 2 drops 2 (two) times daily into the nose.   Marland Kitchen OVER THE COUNTER MEDICATION Take 1 tablet by mouth daily. Calcium 500mg , Magnesium 500mg , Potassium 99mg    . OVER THE COUNTER  MEDICATION Take 60 mLs by mouth as needed (hip pain). 06/06/2019: Organic tart cherry juice quarter of a cup a day  . pravastatin (PRAVACHOL) 20 MG tablet Take 1 tablet (20 mg total) by mouth every other day.   . pregabalin (LYRICA) 75 MG capsule Take 1 capsule (75 mg total) by mouth 2 (two) times daily.   Marland Kitchen tiZANidine (ZANAFLEX) 4 MG tablet Take 4 mg by mouth at bedtime as needed for muscle spasms.   . traMADol (ULTRAM) 50 MG tablet Take 1-2 tablets (50-100 mg total) by mouth every 6 (six) hours as needed (pain).   . traZODone (DESYREL) 25 mg TABS tablet Take 25 mg by mouth at bedtime. 06/06/2019: Take as needed   . valACYclovir (VALTREX) 500 MG tablet Take 1 tablet (500 mg total) by mouth daily.   Marland Kitchen docusate sodium (COLACE) 100 MG capsule Take 1 capsule (100 mg total) by mouth 2 (two) times daily.   Marland Kitchen nystatin (MYCOSTATIN/NYSTOP) powder Apply topically 3 (three) times daily.   . phenylephrine (SUDAFED PE) 10 MG TABS tablet Take 10 mg by mouth every 6 (six) hours as needed (congestion).   . SUMAtriptan (IMITREX) 25 MG tablet take one tablet at start of headache. May repeat in 2 hours x 1 if needed (Patient not taking: Reported on 11/15/2018)    No facility-administered encounter medications on file as of 06/06/2019.     Goals Addressed            This Visit's Progress   . "I am trying to stay healthy" (pt-stated)  Current Barriers:  . Chronic Disease Management support, education, and care coordination needs related to HLD and Obesity  Clinical Goal(s) related to HLD and Obesity :  Over the next 90 days, patient will:  . Work with the care management team to address educational, disease management, and care coordination needs  . Begin self health monitoring activities as directed today . Call provider office for new or worsened signs and symptoms . Call care management team with questions or concerns . Verbalize basic understanding of patient centered plan of care established  today  Interventions related to HLD and Obesity :  . Evaluation of current treatment plans and patient's adherence to plan as established by provider . Assessed patient understanding of disease states . Assessed patient's education and care coordination needs . Provided basic disease specific education to patient  . Collaborated with appropriate clinical care team members regarding patient needs  Patient Self Care Activities related to HLD and Obesity :  . Patient is unable to independently self-manage chronic health conditions  Initial goal documentation     . "I want to lose weight becasue I don't want to be a diabetic" (pt-stated)       Current Barriers:  Marland Kitchen Knowledge Deficits of weight loss as evidenced by verbalization of problem with with weight reduction  Nurse Case Manager Clinical Goal(s):  Marland Kitchen Over the next 30 days, patient will verbalize understanding of plan for of need for a lifestyle change to main/control weight. . Over the next 90 days, patient will work with Consulting civil engineer to address needs related to weight loss and work on a plan to attain her goal of weight loss.  Interventions:  . Evaluation of current treatment plan related to weight loss and patient's adherence to plan as established by provider. . Advised patient to drink more water, eat smaller meals . Provided education to patient re: will send handout to the patient about eating 5 small meals a day. . Reviewed medications with patient  Patient Self Care Activities:  . Self administers medications as prescribed . Performs ADL's independently . Performs IADL's independently . Unable to independently manage weight loss  Initial goal documentation        Wt Readings from Last 3 Encounters:  03/25/19 209 lb (94.8 kg)  08/08/18 211 lb (95.7 kg)  08/02/18 211 lb (95.7 kg)    Follow up plan:  The care management team will reach out to the patient again over the next 14 days.  The patient has been  provided with contact information for the care management team and has been advised to call with any health related questions or concerns.   [name] was given information about Care Management services today including:  1. Care Management services include personalized support from designated clinical staff supervised by a physician, including individualized plan of care and coordination with other care providers 2. 24/7 contact phone numbers for assistance for urgent and routine care needs. 3. The patient may stop Care Management services at any time (effective at the end of the month) by phone call to the office staff.  Patient agreed to services and verbal consent obtained.  Lazaro Arms RN, BSN, [facility] Care Management Coordinator [place] Phone: [phone] I Office: [phone] Fax: [phone]

## 2019-06-09 NOTE — Patient Instructions (Signed)
Visit Information  Goals Addressed            This Visit's Progress   . "I am trying to stay healthy" (pt-stated)       Current Barriers:  . Chronic Disease Management support, education, and care coordination needs related to HLD and Obesity  Clinical Goal(s) related to HLD and Obesity :  Over the next 90 days, patient will:  . Work with the care management team to address educational, disease management, and care coordination needs  . Begin self health monitoring activities as directed today . Call provider office for new or worsened signs and symptoms . Call care management team with questions or concerns . Verbalize basic understanding of patient centered plan of care established today  Interventions related to HLD and Obesity :  . Evaluation of current treatment plans and patient's adherence to plan as established by provider . Assessed patient understanding of disease states . Assessed patient's education and care coordination needs . Provided basic disease specific education to patient  . Collaborated with appropriate clinical care team members regarding patient needs  Patient Self Care Activities related to HLD and Obesity :  . Patient is unable to independently self-manage chronic health conditions  Initial goal documentation     . "I want to lose weight becasue I don't want to be a diabetic" (pt-stated)       Current Barriers:  Marland Kitchen Knowledge Deficits of weight loss as evidenced by verbalization of problem with with weight reduction  Nurse Case Manager Clinical Goal(s):  Marland Kitchen Over the next 30 days, patient will verbalize understanding of plan for of need for a lifestyle change to main/control weight. . Over the next 90 days, patient will work with Consulting civil engineer to address needs related to weight loss and work on a plan to attain her goal of weight loss.  Interventions:  . Evaluation of current treatment plan related to weight loss and patient's adherence to plan as  established by provider. . Advised patient to drink more water, eat smaller meals . Provided education to patient re: will send handout to the patient about eating 5 small meals a day. . Reviewed medications with patient  Patient Self Care Activities:  . Self administers medications as prescribed . Performs ADL's independently . Performs IADL's independently . Unable to independently manage weight loss  Initial goal documentation        [name] was given information about Care Management services today including:  1. Care Management services include personalized support from designated clinical staff supervised by her physician, including individualized plan of care and coordination with other care providers 2. 24/7 contact phone numbers for assistance for urgent and routine care needs. 3. The patient may stop CCM services at any time (effective at the end of the month) by phone call to the office staff.  Today the patient will be mailed a handout 5 Meals A Day  Lazaro Arms RN, BSN, [facility] Care Management Coordinator [place] Phone: [phone]  Office: [phone] Fax: [phone]      High Cholesterol  High cholesterol is a condition in which the blood has high levels of a white, waxy, fat-like substance (cholesterol). The human body needs small amounts of cholesterol. The liver makes all the cholesterol that the body needs. Extra (excess) cholesterol comes from the food that we eat. Cholesterol is carried from the liver by the blood through the blood vessels. If you have high cholesterol, deposits (plaques) may build  up on the walls of your blood vessels (arteries). Plaques make the arteries narrower and stiffer. Cholesterol plaques increase your risk for heart attack and stroke. Work with your health care provider to keep your cholesterol levels in a healthy range. What increases the risk? This condition is more likely to develop in people  who:  Eat foods that are high in animal fat (saturated fat) or cholesterol.  Are overweight.  Are not getting enough exercise.  Have a family history of high cholesterol. What are the signs or symptoms? There are no symptoms of this condition. How is this diagnosed? This condition may be diagnosed from the results of a blood test.  If you are older than age 70, your health care provider may check your cholesterol every 4-6 years.  You may be checked more often if you already have high cholesterol or other risk factors for heart disease. The blood test for cholesterol measures:  "Bad" cholesterol (LDL cholesterol). This is the main type of cholesterol that causes heart disease. The desired level for LDL is less than 100.  "Good" cholesterol (HDL cholesterol). This type helps to protect against heart disease by cleaning the arteries and carrying the LDL away. The desired level for HDL is 60 or higher.  Triglycerides. These are fats that the body can store or burn for energy. The desired number for triglycerides is lower than 150.  Total cholesterol. This is a measure of the total amount of cholesterol in your blood, including LDL cholesterol, HDL cholesterol, and triglycerides. A healthy number is less than 200. How is this treated? This condition is treated with diet changes, lifestyle changes, and medicines. Diet changes  This may include eating more whole grains, fruits, vegetables, nuts, and fish.  This may also include cutting back on red meat and foods that have a lot of added sugar. Lifestyle changes  Changes may include getting at least 40 minutes of aerobic exercise 3 times a week. Aerobic exercises include walking, biking, and swimming. Aerobic exercise along with a healthy diet can help you maintain a healthy weight.  Changes may also include quitting smoking. Medicines  Medicines are usually given if diet and lifestyle changes have failed to reduce your cholesterol  to healthy levels.  Your health care provider may prescribe a statin medicine. Statin medicines have been shown to reduce cholesterol, which can reduce the risk of heart disease. Follow these instructions at home: Eating and drinking If told by your health care provider:  Eat chicken (without skin), fish, veal, shellfish, ground [place] breast, and round or loin cuts of red meat.  Do not eat fried foods or fatty meats, such as hot dogs and salami.  Eat plenty of fruits, such as apples.  Eat plenty of vegetables, such as broccoli, potatoes, and carrots.  Eat beans, peas, and lentils.  Eat grains such as barley, rice, couscous, and bulgur wheat.  Eat pasta without cream sauces.  Use skim or nonfat milk, and eat low-fat or nonfat yogurt and cheeses.  Do not eat or drink whole milk, cream, ice cream, egg yolks, or hard cheeses.  Do not eat stick margarine or tub margarines that contain trans fats (also called partially hydrogenated oils).  Do not eat saturated tropical oils, such as coconut oil and palm oil.  Do not eat cakes, cookies, crackers, or other baked goods that contain trans fats.  General instructions  Exercise as directed by your health care provider. Increase your activity level with activities such as gardening,  walking, and taking the stairs.  Take over-the-counter and prescription medicines only as told by your health care provider.  Do not use any products that contain nicotine or tobacco, such as cigarettes and e-cigarettes. If you need help quitting, ask your health care provider.  Keep all follow-up visits as told by your health care provider. This is important. Contact a health care provider if:  You are struggling to maintain a healthy diet or weight.  You need help to start on an exercise program.  You need help to stop smoking. Get help right away if:  You have chest pain.  You have trouble breathing. This information is not intended to replace  advice given to you by your health care provider. Make sure you discuss any questions you have with your health care provider. Document Released: 07/10/2005 Document Revised: 07/13/2017 Document Reviewed: 01/08/2016 Elsevier Patient Education  2020 [employer].  Patient agreed to services and verbal consent obtained.   The patient verbalized understanding of instructions provided today and declined a print copy of patient instruction materials.   The care management team will reach out to the patient again over the next 14 days.  The patient has been provided with contact information for the care management team and has been advised to call with any health related questions or concerns.   Lazaro Arms RN, BSN, [facility] Care Management Coordinator [place] Phone: [phone] I Office: [phone] Fax: [phone]

## 2019-06-18 ENCOUNTER — Other Ambulatory Visit: Payer: Self-pay

## 2019-06-18 ENCOUNTER — Ambulatory Visit

## 2019-06-20 NOTE — Patient Instructions (Addendum)
Visit Information  Goals Addressed            This Visit's Progress   . "I am trying to stay healthy" (pt-stated)       Current Barriers:  . Chronic Disease Management support, education, and care coordination needs related to HLD and Obesity  Clinical Goal(s) related to HLD and Obesity :  Over the next 90 days, patient will:  . Work with the care management team to address educational, disease management, and care coordination needs  . Begin self health monitoring activities as directed today . Call provider office for new or worsened signs and symptoms . Call care management team with questions or concerns . Verbalize basic understanding of patient centered plan of care established today  Interventions related to HLD and Obesity :  . Evaluation of current treatment plans and patient's adherence to plan as established by provider . Assessed patient understanding of disease states Patient states that she understand what she needs to do but has good and bad days accomplishing her goals with diet  Encouraged patient to continue to monitor her diet and not let her down moments deter her progress . Assessed patient's education and care coordination needs Patient received education material . Patient understands that as she works on her caloric intake, reduction of weight, medication adherence and exercise it will help to improve her  HLD. Marland Kitchen Provided basic disease specific education to patient  . Collaborated with appropriate clinical care team members regarding patient needs  Patient Self Care Activities related to HLD and Obesity :  . Patient is unable to independently self-manage chronic health conditions  Initial goal documentation     . "I want to lose weight becasue I don't want to be a diabetic" (pt-stated)       Current Barriers:  Marland Kitchen Knowledge Deficits of weight loss as evidenced by verbalization of problem with with weight reduction  Nurse Case Manager Clinical Goal(s):   Marland Kitchen Over the next 30 days, patient will verbalize understanding of plan for of need for a lifestyle change to main/control weight. . Over the next 90 days, patient will work with Consulting civil engineer to address needs related to weight loss and work on a plan to attain her goal of weight loss.  Interventions:  . Evaluation of current treatment plan related to weight loss and patient's adherence to plan as established by provider. . Advised patient to drink more water, eat smaller meals  . Provided education to patient re: will send handout to the patient about eating 5 small meals a day. Patient states that she received educational material and will use it but make substitutions with foods that she likes and works well with her system. . Reviewed medications with patient she continues to take her medications as prescribed. . Patient has agreed to weigh monthly  as of 3 week ago her weight was 211.4 . Patient will try to exercise  Patient Self Care Activities:  . Self administers medications as prescribed . Performs ADL's independently . Performs IADL's independently . Unable to independently manage weight loss  Initial goal documentation        Angie [name] was given information about Care Management services today including:  1. Care Management services include personalized support from designated clinical staff supervised by her physician, including individualized plan of care and coordination with other care providers 2. 24/7 contact phone numbers for assistance for urgent and routine care needs. 3. The patient may stop CCM services at any  time (effective at the end of the month) by phone call to the office staff.  Patient agreed to services and verbal consent obtained.   The patient verbalized understanding of instructions provided today and declined a print copy of patient instruction materials.   The care management team will reach out to the patient again over the next 14 days.   The patient has been provided with contact information for the care management team and has been advised to call with any health related questions or concerns.   Lazaro Arms RN, BSN, [facility] Care Management Coordinator [place] Phone: [phone] I Office: [phone] Fax: [phone]

## 2019-06-20 NOTE — Chronic Care Management (AMB) (Signed)
Care Management   Follow Up Note   06/20/2019 Name: Angie [name] MRN: [id number] DOB: 08/14/1945  Referred by: [name], MD Reason for referral : Care Coordination (Care Management F/U)   Angie [name] is a 73 y.o. year old female who is a primary care patient of [name], MD. The care management team was consulted for assistance with care management and care coordination needs.    Review of patient status, including review of consultants reports, relevant laboratory and other test results, and collaboration with appropriate care team members and the patient's provider was performed as part of comprehensive patient evaluation and provision of chronic care management services.    SDOH (Social Determinants of Health) screening performed today: None. See Care Plan for related entries.   Advanced Directives: See Care Plan and Vynca application for related entries.   Goals    . "I am trying to stay healthy" (pt-stated)     Current Barriers:  . Chronic Disease Management support, education, and care coordination needs related to HLD and Obesity  Clinical Goal(s) related to HLD and Obesity :  Over the next 90 days, patient will:  . Work with the care management team to address educational, disease management, and care coordination needs  . Begin self health monitoring activities as directed today . Call provider office for new or worsened signs and symptoms . Call care management team with questions or concerns . Verbalize basic understanding of patient centered plan of care established today  Interventions related to HLD and Obesity :  . Evaluation of current treatment plans and patient's adherence to plan as established by provider . Assessed patient understanding of disease states Patient states that she understand what she needs to do but has good and bad days accomplishing her goals with diet  Encouraged patient to continue to monitor her diet and not  let her down moments deter her progress . Assessed patient's education and care coordination needs Patient received education material . Patient understands that as she works on her caloric intake, reduction of weight, medication adherence and exercise it will help to improve her  HLD. Marland Kitchen Provided basic disease specific education to patient  . Collaborated with appropriate clinical care team members regarding patient needs  Patient Self Care Activities related to HLD and Obesity :  . Patient is unable to independently self-manage chronic health conditions  Initial goal documentation     . "I want to lose weight becasue I don't want to be a diabetic" (pt-stated)     Current Barriers:  Marland Kitchen Knowledge Deficits of weight loss as evidenced by verbalization of problem with with weight reduction  Nurse Case Manager Clinical Goal(s):  Marland Kitchen Over the next 30 days, patient will verbalize understanding of plan for of need for a lifestyle change to main/control weight. . Over the next 90 days, patient will work with Consulting civil engineer to address needs related to weight loss and work on a plan to attain her goal of weight loss.  Interventions:  . Evaluation of current treatment plan related to weight loss and patient's adherence to plan as established by provider. . Advised patient to drink more water, eat smaller meals  . Provided education to patient re: will send handout to the patient about eating 5 small meals a day. Patient states that she received educational material and will use it but make substitutions with foods that she likes and works well with her system. . Reviewed medications with patient she continues to  take her medications as prescribed. . Patient has agreed to weigh monthly  as of 3 week ago her weight was 211.4 . Patient will try to exercise  Patient Self Care Activities:  . Self administers medications as prescribed . Performs ADL's independently . Performs IADL's independently .  Unable to independently manage weight loss  Initial goal documentation     . Sleep at least 8 hours per night (pt-stated)    . Weight (lb) < 188 lb (85.3 kg) (pt-stated)     5 % weight loss        The care management team will reach out to the patient again over the next 14 days.  The patient has been provided with contact information for the care management team and has been advised to call with any health related questions or concerns.   Lazaro Arms RN, BSN, [facility] Care Management Coordinator [place] Phone: [phone] I Office: [phone] Fax: [phone]

## 2019-06-24 DIAGNOSIS — M7661 Achilles tendinitis, right leg: Secondary | ICD-10-CM | POA: Diagnosis not present

## 2019-07-04 ENCOUNTER — Ambulatory Visit: Payer: Medicare Other

## 2019-07-04 NOTE — Chronic Care Management (AMB) (Signed)
  Care Management   Follow Up Note   07/04/2019 Name: Angie [name] MRN: [id number] DOB: 05/25/1946  Referred by: [name], MD Reason for referral : Care Coordination (Care Management)   Angie [name] is a 73 y.o. year old female who is a primary care patient of [name], MD. The care management team was consulted for assistance with care management and care coordination needs.    Review of patient status, including review of consultants reports, relevant laboratory and other test results, and collaboration with appropriate care team members and the patient's provider was performed as part of comprehensive patient evaluation and provision of chronic care management services.    SDOH (Social Determinants of Health) screening performed today: None. See Care Plan for related entries.   Advanced Directives: See Care Plan and Vynca application for related entries.   Goals Addressed            This Visit's Progress   . "I want to lose weight becasue I don't want to be a diabetic" (pt-stated)   On track    Current Barriers:  Marland Kitchen Knowledge Deficits of weight loss as evidenced by verbalization of problem with with weight reduction  Nurse Case Manager Clinical Goal(s):  Marland Kitchen Over the next 30 days, patient will verbalize understanding of plan for of need for a lifestyle change to main/control weight. . Over the next 90 days, patient will work with Consulting civil engineer to address needs related to weight loss and work on a plan to attain her goal of weight loss.  Interventions:  . Evaluation of current treatment plan related to weight loss and patient's adherence to plan as established by provider. . Patient is eating popcorn, pretzels, and pecans for her nightly cravings . Patient has maintained weight of 211 through the Thanksgiving Holiday . Patient is exercising three times a week . Patient has started to meal prep through the week for mornig meals and evening  meals . She is continuing to drink water   Patient Self Care Activities:  . Self administers medications as prescribed . Performs ADL's independently . Performs IADL's independently . Unable to independently manage weight loss  Please see past updates related to this goal by clicking on the "Past Updates" button in the selected goal          The care management team will reach out to the patient again over the next 14 days.  The patient has been provided with contact information for the care management team and has been advised to call with any health related questions or concerns.   Lazaro Arms RN, BSN, [facility] Care Management Coordinator [place] Phone: [phone] I Office: [phone] Fax: [phone]

## 2019-07-04 NOTE — Patient Instructions (Signed)
Visit Information  Goals Addressed            This Visit's Progress   . "I want to lose weight becasue I don't want to be a diabetic" (pt-stated)   On track    Current Barriers:  Marland Kitchen Knowledge Deficits of weight loss as evidenced by verbalization of problem with with weight reduction  Nurse Case Manager Clinical Goal(s):  Marland Kitchen Over the next 30 days, patient will verbalize understanding of plan for of need for a lifestyle change to main/control weight. . Over the next 90 days, patient will work with Consulting civil engineer to address needs related to weight loss and work on a plan to attain her goal of weight loss.  Interventions:  . Evaluation of current treatment plan related to weight loss and patient's adherence to plan as established by provider. . Patient is eating popcorn, pretzels, and pecans for her nightly cravings . Patient has maintained weight of 211 through the Thanksgiving Holiday . Patient is exercising three times a week . Patient has started to meal prep through the week for mornig meals and evening meals . She is continuing to drink water   Patient Self Care Activities:  . Self administers medications as prescribed . Performs ADL's independently . Performs IADL's independently . Unable to independently manage weight loss  Please see past updates related to this goal by clicking on the "Past Updates" button in the selected goal         [name] was given information about Care Management services today including:  1. Care Management services include personalized support from designated clinical staff supervised by her physician, including individualized plan of care and coordination with other care providers 2. 24/7 contact phone numbers for assistance for urgent and routine care needs. 3. The patient may stop CCM services at any time (effective at the end of the month) by phone call to the office staff.  Patient agreed to services and verbal consent obtained.    The patient verbalized understanding of instructions provided today and declined a print copy of patient instruction materials.   The care management team will reach out to the patient again over the next 14 days.  The patient has been provided with contact information for the care management team and has been advised to call with any health related questions or concerns.    Lazaro Arms RN, BSN, [facility] Care Management Coordinator [place] Phone: [phone] I Office: [phone] Fax: [phone]

## 2019-07-31 ENCOUNTER — Other Ambulatory Visit: Payer: Self-pay

## 2019-07-31 ENCOUNTER — Ambulatory Visit: Payer: Medicare Other

## 2019-07-31 NOTE — Chronic Care Management (AMB) (Signed)
  Care Management   Outreach Note  07/31/2019 Name: Angie [name] MRN: [id number] DOB: [birth year]  Referred by: [name], MD Reason for referral : Care Coordination (Care Management RNCM Obesity/HLD)   An unsuccessful telephone outreach was attempted today. The patient was referred to the case management team by for assistance with care management and care coordination.   Follow Up Plan: A HIPPA compliant phone message was left for the patient providing contact information and requesting a return call.  The care management team will reach out to the patient again over the next 7 days.   Lazaro Arms RN, BSN, [facility] Care Management Coordinator [place] Phone: [phone] Fax: [phone]

## 2019-08-04 ENCOUNTER — Other Ambulatory Visit: Payer: Self-pay

## 2019-08-04 ENCOUNTER — Ambulatory Visit: Payer: Medicare Other

## 2019-08-04 NOTE — Chronic Care Management (AMB) (Signed)
  Care Management   Outreach Note  08/04/2019 Name: Angie [name] MRN: [id number] DOB: 12-Mar-1946  Referred by: [name], MD Reason for referral : Care Coordination (Care Management  RNCM  Obesity /HDL)   A second unsuccessful telephone outreach was attempted today. The patient was referred to the case management team for assistance with care management and care coordination.   Follow Up Plan: A HIPPA compliant phone message was left for the patient providing contact information and requesting a return call.  The care management team will reach out to the patient again over the next 7 days.   Lazaro Arms RN, BSN, [facility] Care Management Coordinator [place] Phone: [phone] Fax: [phone]

## 2019-08-05 ENCOUNTER — Ambulatory Visit: Payer: Self-pay

## 2019-08-07 NOTE — Patient Instructions (Signed)
Visit Information  Goals Addressed            This Visit's Progress   . "I am trying to stay healthy" (pt-stated)       Current Barriers:  . Chronic Disease Management support, education, and care coordination needs related to HLD and Obesity  Clinical Goal(s) related to HLD and Obesity :  Over the next 90 days, patient will:  . Work with the care management team to address educational, disease management, and care coordination needs  . Begin self health monitoring activities as directed today . Call provider office for new or worsened signs and symptoms . Call care management team with questions or concerns . Verbalize basic understanding of patient centered plan of care established today  Interventions related to HLD and Obesity :  . Evaluation of current treatment plans and patient's adherence to plan as established by provider . Assessed patient understanding of disease states Patient states that she understand what she needs to do but has good and bad days accomplishing her goals with diet  Encouraged patient to continue to monitor her diet and not let her down moments deter her progress . Assessed patient's education and care coordination needs Patient received education material . Patient understands that as she works on her caloric intake, reduction of weight, medication adherence and exercise it will help to improve her  HLD. Marland Kitchen Provided basic disease specific education to patient  . Collaborated with appropriate clinical care team members regarding patient needs  . 08/05/19 . Patient states that she is still exercising,  She is debating if she is going to put a hold on her member ship and exercise at home . She states that she weighed and she has gained 2 lbs  . She has been researching a 1500 calorie diet and taking some idea of food choices from it. . Late night snacking is still a issue,  She has a craving for salt,  discussed the problems with eating sodium in the  diet. . Patient states that she had been having headaches for the last few days but today no HA. Marland Kitchen She had cut back on water but she is increasing back to what she was drinking before.  Patient Self Care Activities related to HLD and Obesity :  . Patient is unable to independently self-manage chronic health conditions  Initial goal documentation        Angie [name] was given information about Care Management services today including:  1. Care Management services include personalized support from designated clinical staff supervised by her physician, including individualized plan of care and coordination with other care providers 2. 24/7 contact phone numbers for assistance for urgent and routine care needs. 3. The patient may stop CCM services at any time (effective at the end of the month) by phone call to the office staff.  Patient agreed to services and verbal consent obtained.   The patient verbalized understanding of instructions provided today and declined a print copy of patient instruction materials.   The care management team will reach out to the patient again over the next 14 days.  The patient has been provided with contact information for the care management team and has been advised to call with any health related questions or concerns.   Lazaro Arms RN, BSN, [facility] Care Management Coordinator [place] Phone: [phone] Fax: [phone]

## 2019-08-07 NOTE — Chronic Care Management (AMB) (Signed)
Care Management   Follow Up Note   08/07/2019 Name: Angie [name] MRN: [id number] DOB: June 03, 1946  Referred by: [name], MD Reason for referral : Care Coordination (Care Management Obesity Hdl)   Angie [name] is a 74 y.o. year old female who is a primary care patient of [name], MD. The care management team was consulted for assistance with care management and care coordination needs.    Review of patient status, including review of consultants reports, relevant laboratory and other test results, and collaboration with appropriate care team members and the patient's provider was performed as part of comprehensive patient evaluation and provision of chronic care management services.    SDOH (Social Determinants of Health) screening performed today: Physical Activity. See Care Plan for related entries.   Advanced Directives: See Care Plan and Vynca application for related entries.   Goals Addressed            This Visit's Progress   . "I am trying to stay healthy" (pt-stated)       Current Barriers:  . Chronic Disease Management support, education, and care coordination needs related to HLD and Obesity  Clinical Goal(s) related to HLD and Obesity :  Over the next 90 days, patient will:  . Work with the care management team to address educational, disease management, and care coordination needs  . Begin self health monitoring activities as directed today . Call provider office for new or worsened signs and symptoms . Call care management team with questions or concerns . Verbalize basic understanding of patient centered plan of care established today  Interventions related to HLD and Obesity :  . Evaluation of current treatment plans and patient's adherence to plan as established by provider . Assessed patient understanding of disease states Patient states that she understand what she needs to do but has good and bad days accomplishing her goals  with diet  Encouraged patient to continue to monitor her diet and not let her down moments deter her progress . Assessed patient's education and care coordination needs Patient received education material . Patient understands that as she works on her caloric intake, reduction of weight, medication adherence and exercise it will help to improve her  HLD. Marland Kitchen Provided basic disease specific education to patient  . Collaborated with appropriate clinical care team members regarding patient needs  . 08/05/19 . Patient states that she is still exercising,  She is debating if she is going to put a hold on her member ship and exercise at home . She states that she weighed and she has gained 2 lbs  . She has been researching a 1500 calorie diet and taking some idea of food choices from it. . Late night snacking is still a issue,  She has a craving for salt,  discussed the problems with eating sodium in the diet. . Patient states that she had been having headaches for the last few days but today no HA. Marland Kitchen She had cut back on water but she is increasing back to what she was drinking before.  Patient Self Care Activities related to HLD and Obesity :  . Patient is unable to independently self-manage chronic health conditions  Initial goal documentation         The care management team will reach out to the patient again over the next 14 days.  The patient has been provided with contact information for the care management team and has been advised to call with any health  related questions or concerns.   Lazaro Arms RN, BSN, [facility] Care Management Coordinator [place] Phone: [phone] Fax: [phone]

## 2019-08-13 ENCOUNTER — Other Ambulatory Visit: Payer: Self-pay

## 2019-08-13 ENCOUNTER — Telehealth: Payer: Medicare Other

## 2019-08-20 ENCOUNTER — Other Ambulatory Visit: Payer: Self-pay

## 2019-08-20 ENCOUNTER — Ambulatory Visit: Payer: Medicare Other

## 2019-08-20 NOTE — Chronic Care Management (AMB) (Signed)
  Care Management   Outreach Note  08/20/2019 Name: Angie [name] MRN: [id number] DOB: 07-07-1946  Referred by: [name], MD Reason for referral : Care Coordination (Care Management RNCM Obesity/ HDL)   An unsuccessful telephone outreach was attempted today. The patient was referred to the case management team by for assistance with care management and care coordination.   Follow Up Plan: A HIPPA compliant phone message was left for the patient providing contact information and requesting a return call.  The care management team will reach out to the patient again over the next 14 days.   Lazaro Arms RN, BSN, [facility] Care Management Coordinator [place] Phone: [phone] Fax: [phone]

## 2019-08-29 ENCOUNTER — Other Ambulatory Visit: Payer: Self-pay

## 2019-08-29 MED ORDER — PREGABALIN 75 MG PO CAPS: 75.0000 mg | ORAL_CAPSULE | Freq: Two times a day (BID) | ORAL | 1 refills | Status: DC

## 2019-09-03 ENCOUNTER — Ambulatory Visit: Payer: Medicare Other

## 2019-09-03 ENCOUNTER — Other Ambulatory Visit: Payer: Self-pay

## 2019-09-07 ENCOUNTER — Ambulatory Visit: Payer: Medicare Other | Attending: Internal Medicine

## 2019-09-07 DIAGNOSIS — Z23 Encounter for immunization: Secondary | ICD-10-CM | POA: Insufficient documentation

## 2019-09-07 NOTE — Progress Notes (Signed)
   Covid-19 Vaccination Clinic  Name:  Angie [name]    MRN: [id number] DOB: [birth year]  09/07/2019  [name] was observed post Covid-19 immunization for 15 minutes without incidence. She was provided with Vaccine Information Sheet and instruction to access the V-Safe system.   [name] was instructed to call 911 with any severe reactions post vaccine: Marland Kitchen Difficulty breathing  . Swelling of your face and throat  . A fast heartbeat  . A bad rash all over your body  . Dizziness and weakness    Immunizations Administered    Name Date Dose VIS Date Route   Pfizer COVID-19 Vaccine 09/07/2019  1:14 PM 0.3 mL 07/04/2019 Intramuscular   Manufacturer: [place]   Lot: [id number]   [place]: [id number]

## 2019-09-09 ENCOUNTER — Telehealth: Payer: Medicare Other

## 2019-09-10 NOTE — Patient Instructions (Signed)
Visit Information  Goals Addressed            This Visit's Progress   . "I am trying to stay healthy" (pt-stated)       Current Barriers:  . Chronic Disease Management support, education, and care coordination needs related to HLD and Obesity  Clinical Goal(s) related to HLD and Obesity :  Over the next 90 days, patient will:  . Work with the care management team to address educational, disease management, and care coordination needs  . Begin self health monitoring activities as directed today . Call provider office for new or worsened signs and symptoms . Call care management team with questions or concerns . Verbalize basic understanding of patient centered plan of care established today  Interventions related to HLD and Obesity :  . Evaluation of current treatment plans and patient's adherence to plan as established by provider . Assessed patient understanding of disease states Patient states that she understand what she needs to do but has good and bad days accomplishing her goals with diet  Encouraged patient to continue to monitor her diet and not let her down moments deter her progress . Assessed patient's education and care coordination needs Patient received education material . Patient understands that as she works on her caloric intake, reduction of weight, medication adherence and exercise it will help to improve her  HLD. Marland Kitchen Provided basic disease specific education to patient  . Collaborated with appropriate clinical care team members regarding patient needs  . 08/05/19 . Patient states that she is still exercising,  She is debating if she is going to put a hold on her member ship and exercise at home . She states that she weighed and she has gained 2 lbs  . She has been researching a 1500 calorie diet and taking some idea of food choices from it. . Late night snacking is still a issue,  She has a craving for salt,  discussed the problems with eating sodium in the  diet. . Patient states that she had been having headaches for the last few days but today no HA. Marland Kitchen She had cut back on water but she is increasing back to what she was drinking before.  . 09/03/19 . Patient states that she is still having some issues with eating.  She find that it is becoming hard to prep for meals and when it is busy and she is out she can use the "easy button" and stop by a fast food place.  We discussed how common it is.  We also discussed about how to restart on her journey to lose pounds slowly. . She is still craving salt and eating chips at night.  We discuss other alternatives that she can try. . She continues to drink water. . She continues to exercise  30 minuets  and will walk 2 miles with her grandchild she had to look into other alternative because at the [facility] [employer] [facility] some of her classes had been canceled . She checks her blood sugars once monthly and has been noticing a slight increase from 116, 117, 118 so that has made her more determined to do better because she does not want to be on medication . I help sign her up on the [place] waiting list for the covid -19 shot she was very appreciative. . Call CCM team with any questions or concerns   Patient Self Care Activities related to HLD and Obesity :  . Patient is unable to independently  self-manage chronic health conditions  Initial goal documentation        [name] was given information about Care Management services today including:  1. Care Management services include personalized support from designated clinical staff supervised by her physician, including individualized plan of care and coordination with other care providers 2. 24/7 contact phone numbers for assistance for urgent and routine care needs. 3. The patient may stop CCM services at any time (effective at the end of the month) by phone call to the office staff.  Patient agreed to services and verbal consent obtained.   The patient  verbalized understanding of instructions provided today and declined a print copy of patient instruction materials.   The care management team will reach out to the patient again over the next 14 days.  The patient has been provided with contact information for the care management team and has been advised to call with any health related questions or concerns.   Lazaro Arms RN, BSN, [facility] Care Management Coordinator [place] Phone: [phone] Fax: [phone]

## 2019-09-10 NOTE — Chronic Care Management (AMB) (Signed)
Care Management   Follow Up Note   09/10/2019 Name: Angie [name] MRN: [id number] DOB: May 14, 1946  Referred by: [name], MD Reason for referral : Care Coordination (Care Management RNCM Obesity/HDL)   Angie [name] is a 74 y.o. year old female who is a primary care patient of [name], MD. The care management team was consulted for assistance with care management and care coordination needs.    Review of patient status, including review of consultants reports, relevant laboratory and other test results, and collaboration with appropriate care team members and the patient's provider was performed as part of comprehensive patient evaluation and provision of chronic care management services.    SDOH (Social Determinants of Health) screening performed today: None. See Care Plan for related entries.   Advanced Directives: See Care Plan and Vynca application for related entries.   Goals Addressed            This Visit's Progress   . "I am trying to stay healthy" (pt-stated)       Current Barriers:  . Chronic Disease Management support, education, and care coordination needs related to HLD and Obesity  Clinical Goal(s) related to HLD and Obesity :  Over the next 90 days, patient will:  . Work with the care management team to address educational, disease management, and care coordination needs  . Begin self health monitoring activities as directed today . Call provider office for new or worsened signs and symptoms . Call care management team with questions or concerns . Verbalize basic understanding of patient centered plan of care established today  Interventions related to HLD and Obesity :  . Evaluation of current treatment plans and patient's adherence to plan as established by provider . Assessed patient understanding of disease states Patient states that she understand what she needs to do but has good and bad days accomplishing her goals with  diet  Encouraged patient to continue to monitor her diet and not let her down moments deter her progress . Assessed patient's education and care coordination needs Patient received education material . Patient understands that as she works on her caloric intake, reduction of weight, medication adherence and exercise it will help to improve her  HLD. Marland Kitchen Provided basic disease specific education to patient  . Collaborated with appropriate clinical care team members regarding patient needs  . 08/05/19 . Patient states that she is still exercising,  She is debating if she is going to put a hold on her member ship and exercise at home . She states that she weighed and she has gained 2 lbs  . She has been researching a 1500 calorie diet and taking some idea of food choices from it. . Late night snacking is still a issue,  She has a craving for salt,  discussed the problems with eating sodium in the diet. . Patient states that she had been having headaches for the last few days but today no HA. Marland Kitchen She had cut back on water but she is increasing back to what she was drinking before.  . 09/03/19 . Patient states that she is still having some issues with eating.  She find that it is becoming hard to prep for meals and when it is busy and she is out she can use the "easy button" and stop by a fast food place.  We discussed how common it is.  We also discussed about how to restart on her journey to lose pounds slowly. . She is  still craving salt and eating chips at night.  We discuss other alternatives that she can try. . She continues to drink water. . She continues to exercise  30 minuets  and will walk 2 miles with her grandchild she had to look into other alternative because at the [employer] some of her classes had been canceled . She checks her blood sugars once monthly and has been noticing a slight increase from 116, 117, 118 so that has made her more determined to do better because she does not want to be on  medication . I help sign her up on the [place] waiting list for the covid -19 shot she was very appreciative. . Call CCM team with any questions or concerns   Patient Self Care Activities related to HLD and Obesity :  . Patient is unable to independently self-manage chronic health conditions  Initial goal documentation         The care management team will reach out to the patient again over the next 14 days.  The patient has been provided with contact information for the care management team and has been advised to call with any health related questions or concerns.   Lazaro Arms RN, BSN, [facility] Care Management Coordinator [place] Phone: [phone] Fax: [phone]

## 2019-09-17 ENCOUNTER — Other Ambulatory Visit: Payer: Self-pay

## 2019-09-17 ENCOUNTER — Ambulatory Visit: Payer: Medicare Other

## 2019-09-19 DIAGNOSIS — M170 Bilateral primary osteoarthritis of knee: Secondary | ICD-10-CM | POA: Diagnosis not present

## 2019-09-24 ENCOUNTER — Other Ambulatory Visit: Payer: Self-pay

## 2019-09-24 DIAGNOSIS — K045 Chronic apical periodontitis: Secondary | ICD-10-CM | POA: Diagnosis not present

## 2019-09-24 MED ORDER — ESOMEPRAZOLE MAGNESIUM 20 MG PO CPDR: 20.0000 mg | DELAYED_RELEASE_CAPSULE | Freq: Every day | ORAL | 3 refills | Status: DC | PRN

## 2019-09-24 MED ORDER — PREGABALIN 75 MG PO CAPS: 75.0000 mg | ORAL_CAPSULE | Freq: Two times a day (BID) | ORAL | 1 refills | Status: DC

## 2019-09-24 MED ORDER — CLOBETASOL PROPIONATE 0.05 % EX CREA: 1.0000 "application " | TOPICAL_CREAM | Freq: Two times a day (BID) | CUTANEOUS | 1 refills | Status: DC

## 2019-09-24 MED ORDER — MONTELUKAST SODIUM 10 MG PO TABS: 10.0000 mg | ORAL_TABLET | Freq: Every day | ORAL | 1 refills | Status: DC

## 2019-09-24 MED ORDER — VALACYCLOVIR HCL 500 MG PO TABS: 500.0000 mg | ORAL_TABLET | Freq: Every day | ORAL | 3 refills | Status: DC

## 2019-09-24 MED ORDER — PRAVASTATIN SODIUM 20 MG PO TABS: 20.0000 mg | ORAL_TABLET | ORAL | 1 refills | Status: DC

## 2019-09-24 MED ORDER — HYDROCHLOROTHIAZIDE 25 MG PO TABS: 25.0000 mg | ORAL_TABLET | Freq: Every day | ORAL | 3 refills | Status: DC

## 2019-09-24 MED ORDER — SUMATRIPTAN SUCCINATE 25 MG PO TABS: ORAL_TABLET | ORAL | 2 refills | Status: DC

## 2019-09-30 ENCOUNTER — Ambulatory Visit: Payer: Medicare Other | Attending: Internal Medicine

## 2019-09-30 DIAGNOSIS — Z23 Encounter for immunization: Secondary | ICD-10-CM | POA: Insufficient documentation

## 2019-09-30 NOTE — Chronic Care Management (AMB) (Signed)
Care Management   Follow Up Note   09/30/2019 Name: Angie [name] MRN: [id number] DOB: 1946/03/22  Referred by: [name], MD Reason for referral : Care Coordination (Care management  RNCM Obesity/HDL)   Angie [name] is a 74 y.o. year old female who is a primary care patient of [name], MD. The care management team was consulted for assistance with care management and care coordination needs.    Review of patient status, including review of consultants reports, relevant laboratory and other test results, and collaboration with appropriate care team members and the patient's provider was performed as part of comprehensive patient evaluation and provision of chronic care management services.    SDOH (Social Determinants of Health) assessments performed: No See Care Plan activities for detailed interventions related to [facility])     Advanced Directives: See Care Plan and Vynca application for related entries.   Goals Addressed            This Visit's Progress   . "I am trying to stay healthy" (pt-stated)       Current Barriers:  . Chronic Disease Management support, education, and care coordination needs related to HLD and Obesity  Clinical Goal(s) related to HLD and Obesity :  Over the next 90 days, patient will:  . Work with the care management team to address educational, disease management, and care coordination needs  . Begin self health monitoring activities as directed today . Call provider office for new or worsened signs and symptoms . Call care management team with questions or concerns . Verbalize basic understanding of patient centered plan of care established today  Interventions related to HLD and Obesity :  . Evaluation of current treatment plans and patient's adherence to plan as established by provider . Assessed patient understanding of disease states Patient states that she understand what she needs to do but has good and bad days  accomplishing her goals with diet  Encouraged patient to continue to monitor her diet and not let her down moments deter her progress . Assessed patient's education and care coordination needs Patient received education material . Patient understands that as she works on her caloric intake, reduction of weight, medication adherence and exercise it will help to improve her  HLD. Marland Kitchen Provided basic disease specific education to patient  . Collaborated with appropriate clinical care team members regarding patient needs  . 08/05/19 . Patient states that she is still exercising,  She is debating if she is going to put a hold on her member ship and exercise at home . She states that she weighed and she has gained 2 lbs  . She has been researching a 1500 calorie diet and taking some idea of food choices from it. . Late night snacking is still a issue,  She has a craving for salt,  discussed the problems with eating sodium in the diet. . Patient states that she had been having headaches for the last few days but today no HA. Marland Kitchen She had cut back on water but she is increasing back to what she was drinking before.  . 09/03/19 . Patient states that she is still having some issues with eating.  She find that it is becoming hard to prep for meals and when it is busy and she is out she can use the "easy button" and stop by a fast food place.  We discussed how common it is.  We also discussed about how to restart on her journey to  lose pounds slowly. . She is still craving salt and eating chips at night.  We discuss other alternatives that she can try. . She continues to drink water. . She continues to exercise  30 minuets  and will walk 2 miles with her grandchild she had to look into other alternative because at the [employer] [facility] some of her classes had been canceled . She checks her blood sugars once monthly and has been noticing a slight increase from 116, 117, 118 so that has made her more determined to do better because  she does not want to be on medication . I help sign her up on the [place] waiting list for the covid -19 shot she was very appreciative. . Call CCM team with any questions or concerns  09/17/19 o Patient states that she has gained 1 pound  o She continues to have cravings at night for salty foods o She has had problems with her knee and has not exercised in 2 weeks, she is going to ortho to get an injection o She states that she has been having problems with her bowels as well she is using Miralax. Colace and drinking h2o o Patient states that she has had her first injection for Covid and will have the 2nd one in March o Patient is interested in talking with the nutritionist in the office.  I have reached out to her and will give her the information at our next call.   Patient Self Care Activities related to HLD and Obesity :  . Patient is unable to independently self-manage chronic health conditions  Initial goal documentation         The care management team will reach out to the patient again over the next 14 days.  The patient has been provided with contact information for the care management team and has been advised to call with any health related questions or concerns.   Lazaro Arms RN, BSN, [facility] Care Management Coordinator [place] Phone: [phone] Fax: [phone]

## 2019-09-30 NOTE — Progress Notes (Signed)
   Covid-19 Vaccination Clinic  Name:  [name]    MRN: [id number] DOB: 1945/08/26  09/30/2019  [name] was observed post Covid-19 immunization for 15 minutes without incident. She was provided with Vaccine Information Sheet and instruction to access the V-Safe system.   [name] was instructed to call 911 with any severe reactions post vaccine: Marland Kitchen Difficulty breathing  . Swelling of face and throat  . A fast heartbeat  . A bad rash all over body  . Dizziness and weakness   Immunizations Administered    Name Date Dose VIS Date Route   Pfizer COVID-19 Vaccine 09/30/2019  9:52 AM 0.3 mL 07/04/2019 Intramuscular   Manufacturer: [place]   Lot: [id number]   [place]: [id number]

## 2019-09-30 NOTE — Patient Instructions (Signed)
Visit Information  Goals Addressed            This Visit's Progress   . "I am trying to stay healthy" (pt-stated)       Current Barriers:  . Chronic Disease Management support, education, and care coordination needs related to HLD and Obesity  Clinical Goal(s) related to HLD and Obesity :  Over the next 90 days, patient will:  . Work with the care management team to address educational, disease management, and care coordination needs  . Begin self health monitoring activities as directed today . Call provider office for new or worsened signs and symptoms . Call care management team with questions or concerns . Verbalize basic understanding of patient centered plan of care established today  Interventions related to HLD and Obesity :  . Evaluation of current treatment plans and patient's adherence to plan as established by provider . Assessed patient understanding of disease states Patient states that she understand what she needs to do but has good and bad days accomplishing her goals with diet  Encouraged patient to continue to monitor her diet and not let her down moments deter her progress . Assessed patient's education and care coordination needs Patient received education material . Patient understands that as she works on her caloric intake, reduction of weight, medication adherence and exercise it will help to improve her  HLD. Marland Kitchen Provided basic disease specific education to patient  . Collaborated with appropriate clinical care team members regarding patient needs  . 08/05/19 . Patient states that she is still exercising,  She is debating if she is going to put a hold on her member ship and exercise at home . She states that she weighed and she has gained 2 lbs  . She has been researching a 1500 calorie diet and taking some idea of food choices from it. . Late night snacking is still a issue,  She has a craving for salt,  discussed the problems with eating sodium in the  diet. . Patient states that she had been having headaches for the last few days but today no HA. Marland Kitchen She had cut back on water but she is increasing back to what she was drinking before.  . 09/03/19 . Patient states that she is still having some issues with eating.  She find that it is becoming hard to prep for meals and when it is busy and she is out she can use the "easy button" and stop by a fast food place.  We discussed how common it is.  We also discussed about how to restart on her journey to lose pounds slowly. . She is still craving salt and eating chips at night.  We discuss other alternatives that she can try. . She continues to drink water. . She continues to exercise  30 minuets  and will walk 2 miles with her grandchild she had to look into other alternative because at the [facility] some of her classes had been canceled . She checks her blood sugars once monthly and has been noticing a slight increase from 116, 117, 118 so that has made her more determined to do better because she does not want to be on medication . I help sign her up on the [place] waiting list for the covid -19 shot she was very appreciative. . Call CCM team with any questions or concerns  09/17/19 o Patient states that she has gained 1 pound  o She continues to have cravings at  night for salty foods o She has had problems with her knee and has not exercised in 2 weeks, she is going to ortho to get an injection o She states that she has been having problems with her bowels as well she is using Miralax. Colace and drinking h2o o Patient states that she has had her first injection for Covid and will have the 2nd one in March o Patient is interested in talking with the nutritionist in the office.  I have reached out to her and will give her the information at our next call.   Patient Self Care Activities related to HLD and Obesity :  . Patient is unable to independently self-manage chronic health conditions  Initial  goal documentation        [name] was given information about Care Management services today including:  1. Care Management services include personalized support from designated clinical staff supervised by her physician, including individualized plan of care and coordination with other care providers 2. 24/7 contact phone numbers for assistance for urgent and routine care needs. 3. The patient may stop CCM services at any time (effective at the end of the month) by phone call to the office staff.  Patient agreed to services and verbal consent obtained.   The patient verbalized understanding of instructions provided today and declined a print copy of patient instruction materials.   The care management team will reach out to the patient again over the next 14 days.  The patient has been provided with contact information for the care management team and has been advised to call with any health related questions or concerns.   Lazaro Arms RN, BSN, [facility] Care Management Coordinator [place] Phone: [phone] Fax: [phone]

## 2019-10-01 ENCOUNTER — Ambulatory Visit: Payer: Self-pay

## 2019-10-01 ENCOUNTER — Ambulatory Visit: Payer: Medicare Other

## 2019-10-01 ENCOUNTER — Other Ambulatory Visit: Payer: Self-pay

## 2019-10-01 NOTE — Patient Instructions (Signed)
Visit Information  Goals Addressed            This Visit's Progress   . "I am trying to stay healthy" (pt-stated)       Current Barriers:  . Chronic Disease Management support, education, and care coordination needs related to HLD and Obesity  Clinical Goal(s) related to HLD and Obesity :  Over the next 90 days, patient will:  . Work with the care management team to address educational, disease management, and care coordination needs  . Begin self health monitoring activities as directed today . Call provider office for new or worsened signs and symptoms . Call care management team with questions or concerns . Verbalize basic understanding of patient centered plan of care established today  Interventions related to HLD and Obesity :  . Evaluation of current treatment plans and patient's adherence to plan as established by provider . Assessed patient understanding of disease states Patient states that she understand what she needs to do but has good and bad days accomplishing her goals with diet  Encouraged patient to continue to monitor her diet and not let her down moments deter her progress . Assessed patient's education and care coordination needs Patient received education material . Patient understands that as she works on her caloric intake, reduction of weight, medication adherence and exercise it will help to improve her  HLD. Marland Kitchen Provided basic disease specific education to patient  . Collaborated with appropriate clinical care team members regarding patient needs  . 08/05/19 . Patient states that she is still exercising,  She is debating if she is going to put a hold on her member ship and exercise at home . She states that she weighed and she has gained 2 lbs  . She has been researching a 1500 calorie diet and taking some idea of food choices from it. . Late night snacking is still a issue,  She has a craving for salt,  discussed the problems with eating sodium in the  diet. . Patient states that she had been having headaches for the last few days but today no HA. Marland Kitchen She had cut back on water but she is increasing back to what she was drinking before.  . 09/03/19 . Patient states that she is still having some issues with eating.  She find that it is becoming hard to prep for meals and when it is busy and she is out she can use the "easy button" and stop by a fast food place.  We discussed how common it is.  We also discussed about how to restart on her journey to lose pounds slowly. . She is still craving salt and eating chips at night.  We discuss other alternatives that she can try. . She continues to drink water. . She continues to exercise  30 minuets  and will walk 2 miles with her grandchild she had to look into other alternative because at the [facility] some of her classes had been canceled . She checks her blood sugars once monthly and has been noticing a slight increase from 116, 117, 118 so that has made her more determined to do better because she does not want to be on medication . I help sign her up on the [place] waiting list for the covid -19 shot she was very appreciative. . Call CCM team with any questions or concerns  09/17/19 o Patient states that she has gained 1 pound  o She continues to have cravings at  night for salty foods o She has had problems with her knee and has not exercised in 2 weeks, she is going to ortho to get an injection o She states that she has been having problems with her bowels as well she is using Miralax. Colace and drinking h2o o Patient states that she has had her first injection for Covid and will have the 2nd one in March o Patient is interested in talking with the nutritionist in the office.  I have reached out to her and will give her the information at our next call. 10/01/19  o Spoke with the patient and she states that she has lost 3 lbs. o She had a tooth worked on and had to have stitches removed today.  She  has only been able to eat soft foods.  She states she has been eating spinach and eggs.  Tonight, she plans on eating a baked potato. o She continues to drink water o She states that her knees are feeling better.  She went to see the orthopedist and was given injections in both of her knees, he told her that she will need to have knee replacements.  She states that she has been able to exercise two times since then with aerobic step at the center and some dancing with you tube. o Gave information to the patient about the nutritionist at the clinic [facility]: [phone] [facility] schedules her own appointments and will give her all the information when she speaks with her.   Patient Self Care Activities related to HLD and Obesity :  . Patient is unable to independently self-manage chronic health conditions  Please see past updates related to this goal by clicking on the "Past Updates" button in the selected goal         Angie [name] was given information about Care Management services today including:  1. Care Management services include personalized support from designated clinical staff supervised by her physician, including individualized plan of care and coordination with other care providers 2. 24/7 contact phone numbers for assistance for urgent and routine care needs. 3. The patient may stop CCM services at any time (effective at the end of the month) by phone call to the office staff.  Patient agreed to services and verbal consent obtained.   The patient verbalized understanding of instructions provided today and declined a print copy of patient instruction materials.   The care management team will reach out to the patient again over the next 14 days.  The patient has been provided with contact information for the care management team and has been advised to call with any health related questions or concerns.   Lazaro Arms RN, BSN, [facility] Care Management Coordinator [place] Phone: [phone] Fax: [phone]

## 2019-10-01 NOTE — Chronic Care Management (AMB) (Signed)
Care Management   Follow Up Note   10/01/2019 Name: Angie [name] MRN: [id number] DOB: [birth year]  Referred by: [name], MD Reason for referral : Care Coordination ([place] Obesity / HDl)   Angie [name] is a 74 y.o. year old female who is a primary care patient of [name], MD. The care management team was consulted for assistance with care management and care coordination needs.    Review of patient status, including review of consultants reports, relevant laboratory and other test results, and collaboration with appropriate care team members and the patient's provider was performed as part of comprehensive patient evaluation and provision of chronic care management services.    SDOH (Social Determinants of Health) assessments performed: No See Care Plan activities for detailed interventions related to Angie [name])     Advanced Directives: See Care Plan and Vynca application for related entries.   Goals Addressed            This Visit's Progress   . "I am trying to stay healthy" (pt-stated)       Current Barriers:  . Chronic Disease Management support, education, and care coordination needs related to HLD and Obesity  Clinical Goal(s) related to HLD and Obesity :  Over the next 90 days, patient will:  . Work with the care management team to address educational, disease management, and care coordination needs  . Begin self health monitoring activities as directed today . Call provider office for new or worsened signs and symptoms . Call care management team with questions or concerns . Verbalize basic understanding of patient centered plan of care established today  Interventions related to HLD and Obesity :  . Evaluation of current treatment plans and patient's adherence to plan as established by provider . Assessed patient understanding of disease states Patient states that she understand what she needs to do but has good and bad days  accomplishing her goals with diet  Encouraged patient to continue to monitor her diet and not let her down moments deter her progress . Assessed patient's education and care coordination needs Patient received education material . Patient understands that as she works on her caloric intake, reduction of weight, medication adherence and exercise it will help to improve her  HLD. Marland Kitchen Provided basic disease specific education to patient  . Collaborated with appropriate clinical care team members regarding patient needs  . 08/05/19 . Patient states that she is still exercising,  She is debating if she is going to put a hold on her member ship and exercise at home . She states that she weighed and she has gained 2 lbs  . She has been researching a 1500 calorie diet and taking some idea of food choices from it. . Late night snacking is still a issue,  She has a craving for salt,  discussed the problems with eating sodium in the diet. . Patient states that she had been having headaches for the last few days but today no HA. Marland Kitchen She had cut back on water but she is increasing back to what she was drinking before.  . 09/03/19 . Patient states that she is still having some issues with eating.  She find that it is becoming hard to prep for meals and when it is busy and she is out she can use the "easy button" and stop by a fast food place.  We discussed how common it is.  We also discussed about how to restart on her journey  to lose pounds slowly. . She is still craving salt and eating chips at night.  We discuss other alternatives that she can try. . She continues to drink water. . She continues to exercise  30 minuets  and will walk 2 miles with her grandchild she had to look into other alternative because at the [employer] [name] some of her classes had been canceled . She checks her blood sugars once monthly and has been noticing a slight increase from 116, 117, 118 so that has made her more determined to do better because  she does not want to be on medication . I help sign her up on the [place] waiting list for the covid -19 shot she was very appreciative. . Call CCM team with any questions or concerns  09/17/19 o Patient states that she has gained 1 pound  o She continues to have cravings at night for salty foods o She has had problems with her knee and has not exercised in 2 weeks, she is going to ortho to get an injection o She states that she has been having problems with her bowels as well she is using Miralax. Colace and drinking h2o o Patient states that she has had her first injection for Covid and will have the 2nd one in March o Patient is interested in talking with the nutritionist in the office.  I have reached out to her and will give her the information at our next call. 10/01/19  o Spoke with the patient and she states that she has lost 3 lbs. o She had a tooth worked on and had to have stitches removed today.  She has only been able to eat soft foods.  She states she has been eating spinach and eggs.  Tonight, she plans on eating a baked potato. o She continues to drink water o She states that her knees are feeling better.  She went to see the orthopedist and was given injections in both of her knees, he told her that she will need to have knee replacements.  She states that she has been able to exercise two times since then with aerobic step at the center and some dancing with you tube. o Gave information to the patient about the nutritionist at the clinic [facility] Nestle: [phone] [name] schedules her own appointments and will give her all the information when she speaks with her.   Patient Self Care Activities related to HLD and Obesity :  . Patient is unable to independently self-manage chronic health conditions  Please see past updates related to this goal by clicking on the "Past Updates" button in the selected goal          The care management team will reach out to the patient  again over the next 14 days.  The patient has been provided with contact information for the care management team and has been advised to call with any health related questions or concerns.   Lazaro Arms RN, BSN, [name] Care Management Coordinator [place] Phone: [phone] Fax: [phone]

## 2019-10-01 NOTE — Chronic Care Management (AMB) (Signed)
  Care Management   Outreach Note  10/01/2019 Name: Angie [name] MRN: [id number] DOB: October 25, 1945  Referred by: [name], MD Reason for referral : Care Coordination (Care Management RNCM Obesity /HDL)   An unsuccessful telephone outreach was attempted today. The patient was referred to the case management team for assistance with care management and care coordination.   Follow Up Plan: A HIPPA compliant phone message was left for the patient providing contact information and requesting a return call.  The care management team will reach out to the patient again over the next 5-7 days.   Lazaro Arms RN, BSN, [facility] Care Management Coordinator [place] Phone: [phone] Fax: [phone]

## 2019-10-08 ENCOUNTER — Telehealth: Payer: Medicare Other

## 2019-10-15 ENCOUNTER — Ambulatory Visit: Payer: Medicare Other

## 2019-10-15 ENCOUNTER — Other Ambulatory Visit: Payer: Self-pay

## 2019-10-25 NOTE — Patient Instructions (Signed)
Visit Information  Goals Addressed            This Visit's Progress   . "I am trying to stay healthy" (pt-stated)       Current Barriers:  . Chronic Disease Management support, education, and care coordination needs related to HLD and Obesity  Clinical Goal(s) related to HLD and Obesity :  Over the next 90 days, patient will:  . Work with the care management team to address educational, disease management, and care coordination needs  . Begin self health monitoring activities as directed today . Call provider office for new or worsened signs and symptoms . Call care management team with questions or concerns . Verbalize basic understanding of patient centered plan of care established today  Interventions related to HLD and Obesity :  . Evaluation of current treatment plans and patient's adherence to plan as established by provider . Assessed patient understanding of disease states Patient states that she understand what she needs to do but has good and bad days accomplishing her goals with diet  Encouraged patient to continue to monitor her diet and not let her down moments deter her progress . Assessed patient's education and care coordination needs Patient received education material . Patient understands that as she works on her caloric intake, reduction of weight, medication adherence and exercise it will help to improve her  HLD. Marland Kitchen Provided basic disease specific education to patient  . Collaborated with appropriate clinical care team members regarding patient needs  . 08/05/19 . Patient states that she is still exercising,  She is debating if she is going to put a hold on her member ship and exercise at home . She states that she weighed and she has gained 2 lbs  . She has been researching a 1500 calorie diet and taking some idea of food choices from it. . Late night snacking is still a issue,  She has a craving for salt,  discussed the problems with eating sodium in the  diet. . Patient states that she had been having headaches for the last few days but today no HA. Marland Kitchen She had cut back on water but she is increasing back to what she was drinking before.  . 09/03/19 . Patient states that she is still having some issues with eating.  She find that it is becoming hard to prep for meals and when it is busy and she is out she can use the "easy button" and stop by a fast food place.  We discussed how common it is.  We also discussed about how to restart on her journey to lose pounds slowly. . She is still craving salt and eating chips at night.  We discuss other alternatives that she can try. . She continues to drink water. . She continues to exercise  30 minuets  and will walk 2 miles with her grandchild she had to look into other alternative because at the [facility] [employer] some of her classes had been canceled . She checks her blood sugars once monthly and has been noticing a slight increase from 116, 117, 118 so that has made her more determined to do better because she does not want to be on medication . I help sign her up on the [place] waiting list for the covid -19 shot she was very appreciative. . Call CCM team with any questions or concerns  09/17/19 o Patient states that she has gained 1 pound  o She continues to have cravings at  night for salty foods o She has had problems with her knee and has not exercised in 2 weeks, she is going to ortho to get an injection o She states that she has been having problems with her bowels as well she is using Miralax. Colace and drinking h2o o Patient states that she has had her first injection for Covid and will have the 2nd one in March o Patient is interested in talking with the nutritionist in the office.  I have reached out to her and will give her the information at our next call. 10/01/19  o Spoke with the patient and she states that she has lost 3 lbs. o She had a tooth worked on and had to have stitches removed today.  She  has only been able to eat soft foods.  She states she has been eating spinach and eggs.  Tonight, she plans on eating a baked potato. o She continues to drink water o She states that her knees are feeling better.  She went to see the orthopedist and was given injections in both of her knees, he told her that she will need to have knee replacements.  She states that she has been able to exercise two times since then with aerobic step at the center and some dancing with you tube. o Gave information to the patient about the nutritionist at the clinic [facility] Nestle: [phone] [facility] schedules her own appointments and will give her all the information when she speaks with her. o 10/15/19 o Patient states that she has not called the nutritionist yet.  She is waiting  she feels that she has a lot of the information but just needs to act upon it. o She still has craving for salty foods at night.  Talked about strategies = ration portions of snack foods, not buying them at all, trying replacement foods. o She does not have a problem with meals o She is still exercising= walking 21/2 miles  classes at the [facility] o Trying to get the mindset that it is not a diet but a way of life = a life style change. o She is taking her medications and staying hydrated    Patient Self Care Activities related to HLD and Obesity :  . Patient is unable to independently self-manage chronic health conditions  Please see past updates related to this goal by clicking on the "Past Updates" button in the selected goal         Angie [name] was given information about Care Management services today including:  1. Care Management services include personalized support from designated clinical staff supervised by her physician, including individualized plan of care and coordination with other care providers 2. 24/7 contact phone numbers for assistance for urgent and routine care needs. 3. The patient may stop CCM services at  any time (effective at the end of the month) by phone call to the office staff.  Patient agreed to services and verbal consent obtained.   The patient verbalized understanding of instructions provided today and declined a print copy of patient instruction materials.   The care management team will reach out to the patient again over the next 14 days.  The patient has been provided with contact information for the care management team and has been advised to call with any health related questions or concerns.   Lazaro Arms RN, BSN, [facility] Care Management Coordinator [place] Phone: [phone] Fax: [phone]

## 2019-10-25 NOTE — Chronic Care Management (AMB) (Signed)
Care Management   Follow Up Note   10/25/2019 Name: Angie [name] MRN: [id number] DOB: 18-Feb-1946  Referred by: [name], MD Reason for referral : Care Coordination (are Management RNCM Obesity /HDL)   Cortez [name] is a 74 y.o. year old female who is a primary care patient of [name], MD. The care management team was consulted for assistance with care management and care coordination needs.    Review of patient status, including review of consultants reports, relevant laboratory and other test results, and collaboration with appropriate care team members and the patient's provider was performed as part of comprehensive patient evaluation and provision of chronic care management services.    SDOH (Social Determinants of Health) assessments performed: No See Care Plan activities for detailed interventions related to [facility])     Advanced Directives: See Care Plan and Vynca application for related entries.   Goals Addressed            This Visit's Progress   . "I am trying to stay healthy" (pt-stated)       Current Barriers:  . Chronic Disease Management support, education, and care coordination needs related to HLD and Obesity  Clinical Goal(s) related to HLD and Obesity :  Over the next 90 days, patient will:  . Work with the care management team to address educational, disease management, and care coordination needs  . Begin self health monitoring activities as directed today . Call provider office for new or worsened signs and symptoms . Call care management team with questions or concerns . Verbalize basic understanding of patient centered plan of care established today  Interventions related to HLD and Obesity :  . Evaluation of current treatment plans and patient's adherence to plan as established by provider . Assessed patient understanding of disease states Patient states that she understand what she needs to do but has good and bad days  accomplishing her goals with diet  Encouraged patient to continue to monitor her diet and not let her down moments deter her progress . Assessed patient's education and care coordination needs Patient received education material . Patient understands that as she works on her caloric intake, reduction of weight, medication adherence and exercise it will help to improve her  HLD. Marland Kitchen Provided basic disease specific education to patient  . Collaborated with appropriate clinical care team members regarding patient needs  . 08/05/19 . Patient states that she is still exercising,  She is debating if she is going to put a hold on her member ship and exercise at home . She states that she weighed and she has gained 2 lbs  . She has been researching a 1500 calorie diet and taking some idea of food choices from it. . Late night snacking is still a issue,  She has a craving for salt,  discussed the problems with eating sodium in the diet. . Patient states that she had been having headaches for the last few days but today no HA. Marland Kitchen She had cut back on water but she is increasing back to what she was drinking before.  . 09/03/19 . Patient states that she is still having some issues with eating.  She find that it is becoming hard to prep for meals and when it is busy and she is out she can use the "easy button" and stop by a fast food place.  We discussed how common it is.  We also discussed about how to restart on her journey to  lose pounds slowly. . She is still craving salt and eating chips at night.  We discuss other alternatives that she can try. . She continues to drink water. . She continues to exercise  30 minuets  and will walk 2 miles with her grandchild she had to look into other alternative because at the [facility] some of her classes had been canceled . She checks her blood sugars once monthly and has been noticing a slight increase from 116, 117, 118 so that has made her more determined to do better because  she does not want to be on medication . I help sign her up on the [place] waiting list for the covid -19 shot she was very appreciative. . Call CCM team with any questions or concerns  09/17/19 o Patient states that she has gained 1 pound  o She continues to have cravings at night for salty foods o She has had problems with her knee and has not exercised in 2 weeks, she is going to ortho to get an injection o She states that she has been having problems with her bowels as well she is using Miralax. Colace and drinking h2o o Patient states that she has had her first injection for Covid and will have the 2nd one in March o Patient is interested in talking with the nutritionist in the office.  I have reached out to her and will give her the information at our next call. 10/01/19  o Spoke with the patient and she states that she has lost 3 lbs. o She had a tooth worked on and had to have stitches removed today.  She has only been able to eat soft foods.  She states she has been eating spinach and eggs.  Tonight, she plans on eating a baked potato. o She continues to drink water o She states that her knees are feeling better.  She went to see the orthopedist and was given injections in both of her knees, he told her that she will need to have knee replacements.  She states that she has been able to exercise two times since then with aerobic step at the center and some dancing with you tube. o Gave information to the patient about the nutritionist at the clinic [facility]: [phone] [facility] schedules her own appointments and will give her all the information when she speaks with her. o 10/15/19 o Patient states that she has not called the nutritionist yet.  She is waiting  she feels that she has a lot of the information but just needs to act upon it. o She still has craving for salty foods at night.  Talked about strategies = ration portions of snack foods, not buying them at all, trying  replacement foods. o She does not have a problem with meals o She is still exercising= walking 21/2 miles  classes at the [facility] o Trying to get the mindset that it is not a diet but a way of life = a life style change. o She is taking her medications and staying hydrated    Patient Self Care Activities related to HLD and Obesity :  . Patient is unable to independently self-manage chronic health conditions  Please see past updates related to this goal by clicking on the "Past Updates" button in the selected goal          The care management team will reach out to the patient again over the next 14 days.  The patient has  been provided with contact information for the care management team and has been advised to call with any health related questions or concerns.   Lazaro Arms RN, BSN, [facility] Care Management Coordinator [place] Phone: [phone] Fax: [phone]

## 2019-10-29 ENCOUNTER — Ambulatory Visit: Payer: Medicare Other

## 2019-10-29 ENCOUNTER — Other Ambulatory Visit: Payer: Self-pay

## 2019-10-29 NOTE — Chronic Care Management (AMB) (Signed)
  Care Management   Outreach Note  10/29/2019 Name: Angie [name] MRN: [id number] DOB: Sep 13, 1945  Referred by: [name], MD Reason for referral : Care Coordination (Care Management RNCM 2 week F/U)   An unsuccessful telephone outreach was attempted today. The patient was referred to the case management team for assistance with care management and care coordination.   Follow Up Plan: A HIPPA compliant phone message was left for the patient providing contact information and requesting a return call.  The care management team will reach out to the patient again over the next 5-7 days.   Lazaro Arms RN, BSN, [facility] Care Management Coordinator [place] Phone: [phone] Fax: [phone]

## 2019-11-05 ENCOUNTER — Other Ambulatory Visit: Payer: Self-pay

## 2019-11-05 ENCOUNTER — Ambulatory Visit: Payer: Medicare Other

## 2019-11-05 NOTE — Chronic Care Management (AMB) (Signed)
  Care Management   Outreach Note  11/05/2019 Name: Angie [name] MRN: [id number] DOB: 10/31/1945  Referred by: [name], MD Reason for referral : Care Coordination (Care Management RNCM Obesity /HDL 2nd attempt)   A second unsuccessful telephone outreach was attempted today. The patient was referred to the case management team for assistance with care management and care coordination.   Follow Up Plan: A HIPPA compliant phone message was left for the patient providing contact information and requesting a return call.  The care management team will reach out to the patient again over the next 7-14 days.   Lazaro Arms RN, BSN, [facility] Care Management Coordinator [place] Phone: [phone] Fax: [phone]

## 2019-11-12 ENCOUNTER — Ambulatory Visit: Payer: Medicare Other

## 2019-11-12 ENCOUNTER — Other Ambulatory Visit: Payer: Self-pay

## 2019-11-12 NOTE — Chronic Care Management (AMB) (Signed)
Care Management   Follow Up Note   11/12/2019 Name: Angie [name] MRN: [id number] DOB: [birth year]  Referred by: [name], MD Reason for referral : Care Coordination (Care Management RNCM Obesity /HDL 3 attempt)   Angie [name] is a 74 y.o. year old female who is a primary care patient of [name], MD. The care management team was consulted for assistance with care management and care coordination needs.    Review of patient status, including review of consultants reports, relevant laboratory and other test results, and collaboration with appropriate care team members and the patient's provider was performed as part of comprehensive patient evaluation and provision of chronic care management services.    SDOH (Social Determinants of Health) assessments performed: No See Care Plan activities for detailed interventions related to [facility])     Advanced Directives: See Care Plan and Vynca application for related entries.   Goals Addressed            This Visit's Progress   . "I am trying to stay healthy" (pt-stated)       Current Barriers:  . Chronic Disease Management support, education, and care coordination needs related to HLD and Obesity  Clinical Goal(s) related to HLD and Obesity :  Over the next 90 days, patient will:  . Work with the care management team to address educational, disease management, and care coordination needs  . Begin self health monitoring activities as directed today . Call provider office for new or worsened signs and symptoms . Call care management team with questions or concerns . Verbalize basic understanding of patient centered plan of care established today  Interventions related to HLD and Obesity :  . Evaluation of current treatment plans and patient's adherence to plan as established by provider . Assessed patient understanding of disease states Patient states that she understand what she needs to do but has good and  bad days accomplishing her goals with diet  Encouraged patient to continue to monitor her diet and not let her down moments deter her progress . Assessed patient's education and care coordination needs Patient received education material . Patient understands that as she works on her caloric intake, reduction of weight, medication adherence and exercise it will help to improve her  HLD. Marland Kitchen Provided basic disease specific education to patient  . Collaborated with appropriate clinical care team members regarding patient needs  . 08/05/19 . Patient states that she is still exercising,  She is debating if she is going to put a hold on her member ship and exercise at home . She states that she weighed and she has gained 2 lbs  . She has been researching a 1500 calorie diet and taking some idea of food choices from it. . Late night snacking is still a issue,  She has a craving for salt,  discussed the problems with eating sodium in the diet. . Patient states that she had been having headaches for the last few days but today no HA. Marland Kitchen She had cut back on water but she is increasing back to what she was drinking before.  . 09/03/19 . Patient states that she is still having some issues with eating.  She find that it is becoming hard to prep for meals and when it is busy and she is out she can use the "easy button" and stop by a fast food place.  We discussed how common it is.  We also discussed about how to restart on her  journey to lose pounds slowly. . She is still craving salt and eating chips at night.  We discuss other alternatives that she can try. . She continues to drink water. . She continues to exercise  30 minuets  and will walk 2 miles with her grandchild she had to look into other alternative because at the [facility] some of her classes had been canceled . She checks her blood sugars once monthly and has been noticing a slight increase from 116, 117, 118 so that has made her more determined to do  better because she does not want to be on medication . I help sign her up on the [place] waiting list for the covid -19 shot she was very appreciative. . Call CCM team with any questions or concerns  09/17/19 o Patient states that she has gained 1 pound  o She continues to have cravings at night for salty foods o She has had problems with her knee and has not exercised in 2 weeks, she is going to ortho to get an injection o She states that she has been having problems with her bowels as well she is using Miralax. Colace and drinking h2o o Patient states that she has had her first injection for Covid and will have the 2nd one in March o Patient is interested in talking with the nutritionist in the office.  I have reached out to her and will give her the information at our next call. 10/01/19  o Spoke with the patient and she states that she has lost 3 lbs. o She had a tooth worked on and had to have stitches removed today.  She has only been able to eat soft foods.  She states she has been eating spinach and eggs.  Tonight, she plans on eating a baked potato. o She continues to drink water o She states that her knees are feeling better.  She went to see the orthopedist and was given injections in both of her knees, he told her that she will need to have knee replacements.  She states that she has been able to exercise two times since then with aerobic step at the center and some dancing with you tube. o Gave information to the patient about the nutritionist at the clinic [facility]le: [phone] [facility] schedules her own appointments and will give her all the information when she speaks with her. o 10/15/19 o Patient states that she has not called the nutritionist yet.  She is waiting  she feels that she has a lot of the information but just needs to act upon it. o She still has craving for salty foods at night.  Talked about strategies = ration portions of snack foods, not buying them at all,  trying replacement foods. o She does not have a problem with meals o She is still exercising= walking 21/2 miles  classes at the [facility] o Trying to get the mindset that it is not a diet but a way of life = a life style change. o She is taking her medications and staying hydrated  o 11/12/19 o Patient states that her weight is at 210 lbs.  Her goal is to be 188 lbs. o She would like to lose at least 1-2 lbs a week o Patient is noticing triggers for foods that are unhealthy o She has her issues with snacks at night .  She likes salty snakes like chips.   o We discussed alternatives.= edamame, kale chips, nuts and  seeds, popcorn o Not to over indulge, take one day at a time o Continue to drink water o She has signed up for exercise class 3 day/week o She quit step aerobics because it was affecting her knee  o She states that she is noticing she has cramps in her thighs.  Advised the patient to schedule an appointment with Dr Gwendlyn Deutscher to discuss issues  and for blood work.  She stated that she would.   Patient Self Care Activities related to HLD and Obesity :  . Patient is unable to independently self-manage chronic health conditions  Please see past updates related to this goal by clicking on the "Past Updates" button in the selected goal          The care management team will reach out to the patient again over the next 14 days.  The patient has been provided with contact information for the care management team and has been advised to call with any health related questions or concerns.     Lazaro Arms RN, BSN, [facility] Care Management Coordinator [place] Phone: [phone] Fax: [phone]

## 2019-11-12 NOTE — Patient Instructions (Signed)
Visit Information  Goals Addressed            This Visit's Progress   . "I am trying to stay healthy" (pt-stated)       Current Barriers:  . Chronic Disease Management support, education, and care coordination needs related to HLD and Obesity  Clinical Goal(s) related to HLD and Obesity :  Over the next 90 days, patient will:  . Work with the care management team to address educational, disease management, and care coordination needs  . Begin self health monitoring activities as directed today . Call provider office for new or worsened signs and symptoms . Call care management team with questions or concerns . Verbalize basic understanding of patient centered plan of care established today  Interventions related to HLD and Obesity :  . Evaluation of current treatment plans and patient's adherence to plan as established by provider . Assessed patient understanding of disease states Patient states that she understand what she needs to do but has good and bad days accomplishing her goals with diet  Encouraged patient to continue to monitor her diet and not let her down moments deter her progress . Assessed patient's education and care coordination needs Patient received education material . Patient understands that as she works on her caloric intake, reduction of weight, medication adherence and exercise it will help to improve her  HLD. Marland Kitchen Provided basic disease specific education to patient  . Collaborated with appropriate clinical care team members regarding patient needs  . 08/05/19 . Patient states that she is still exercising,  She is debating if she is going to put a hold on her member ship and exercise at home . She states that she weighed and she has gained 2 lbs  . She has been researching a 1500 calorie diet and taking some idea of food choices from it. . Late night snacking is still a issue,  She has a craving for salt,  discussed the problems with eating sodium in the  diet. . Patient states that she had been having headaches for the last few days but today no HA. Marland Kitchen She had cut back on water but she is increasing back to what she was drinking before.  . 09/03/19 . Patient states that she is still having some issues with eating.  She find that it is becoming hard to prep for meals and when it is busy and she is out she can use the "easy button" and stop by a fast food place.  We discussed how common it is.  We also discussed about how to restart on her journey to lose pounds slowly. . She is still craving salt and eating chips at night.  We discuss other alternatives that she can try. . She continues to drink water. . She continues to exercise  30 minuets  and will walk 2 miles with her grandchild she had to look into other alternative because at the [facility] some of her classes had been canceled . She checks her blood sugars once monthly and has been noticing a slight increase from 116, 117, 118 so that has made her more determined to do better because she does not want to be on medication . I help sign her up on the [place] waiting list for the covid -19 shot she was very appreciative. . Call CCM team with any questions or concerns  09/17/19 o Patient states that she has gained 1 pound  o She continues to have cravings at  night for salty foods o She has had problems with her knee and has not exercised in 2 weeks, she is going to ortho to get an injection o She states that she has been having problems with her bowels as well she is using Miralax. Colace and drinking h2o o Patient states that she has had her first injection for Covid and will have the 2nd one in March o Patient is interested in talking with the nutritionist in the office.  I have reached out to her and will give her the information at our next call. 10/01/19  o Spoke with the patient and she states that she has lost 3 lbs. o She had a tooth worked on and had to have stitches removed today.  She  has only been able to eat soft foods.  She states she has been eating spinach and eggs.  Tonight, she plans on eating a baked potato. o She continues to drink water o She states that her knees are feeling better.  She went to see the orthopedist and was given injections in both of her knees, he told her that she will need to have knee replacements.  She states that she has been able to exercise two times since then with aerobic step at the center and some dancing with you tube. o Gave information to the patient about the nutritionist at the clinic [facility]: [phone] [facility] schedules her own appointments and will give her all the information when she speaks with her. o 10/15/19 o Patient states that she has not called the nutritionist yet.  She is waiting  she feels that she has a lot of the information but just needs to act upon it. o She still has craving for salty foods at night.  Talked about strategies = ration portions of snack foods, not buying them at all, trying replacement foods. o She does not have a problem with meals o She is still exercising= walking 21/2 miles  classes at the [facility] o Trying to get the mindset that it is not a diet but a way of life = a life style change. o She is taking her medications and staying hydrated  o 11/12/19 o Patient states that her weight is at 210 lbs.  Her goal is to be 188 lbs. o She would like to lose at least 1-2 lbs a week o Patient is noticing triggers for foods that are unhealthy o She has her issues with snacks at night .  She likes salty snakes like chips.   o We discussed alternatives.= edamame, kale chips, nuts and seeds, popcorn o Not to over indulge, take one day at a time o Continue to drink water o She has signed up for exercise class 3 day/week o She quit step aerobics because it was affecting her knee  o She states that she is noticing she has cramps in her thighs.  Advised the patient to schedule an appointment with Dr Gwendlyn Deutscher  to discuss issues  and for blood work.  She stated that she would.   Patient Self Care Activities related to HLD and Obesity :  . Patient is unable to independently self-manage chronic health conditions  Please see past updates related to this goal by clicking on the "Past Updates" button in the selected goal         [name] was given information about Care Management services today including:  1. Care Management services include personalized support from designated clinical staff supervised  by her physician, including individualized plan of care and coordination with other care providers 2. 24/7 contact phone numbers for assistance for urgent and routine care needs. 3. The patient may stop CCM services at any time (effective at the end of the month) by phone call to the office staff.  Patient agreed to services and verbal consent obtained.   The patient verbalized understanding of instructions provided today and declined a print copy of patient instruction materials.   The care management team will reach out to the patient again over the next 14 days.  The patient has been provided with contact information for the care management team and has been advised to call with any health related questions or concerns.    Lazaro Arms RN, BSN, [facility] Care Management Coordinator [place] Phone: [phone] Fax: [phone]

## 2019-11-26 ENCOUNTER — Other Ambulatory Visit: Payer: Self-pay

## 2019-11-26 ENCOUNTER — Ambulatory Visit: Payer: Medicare Other

## 2019-11-26 NOTE — Patient Instructions (Signed)
Visit Information  Goals Addressed            This Visit's Progress   . "I am trying to stay healthy" (pt-stated)       Current Barriers:  . Chronic Disease Management support, education, and care coordination needs related to HLD and Obesity  Clinical Goal(s) related to HLD and Obesity :  Over the next 90 days, patient will:  . Work with the care management team to address educational, disease management, and care coordination needs  . Begin self health monitoring activities as directed today . Call provider office for new or worsened signs and symptoms . Call care management team with questions or concerns . Verbalize basic understanding of patient centered plan of care established today  Interventions related to HLD and Obesity :  . Evaluation of current treatment plans and patient's adherence to plan as established by provider . Assessed patient understanding of disease states Patient states that she understand what she needs to do but has good and bad days accomplishing her goals with diet  Encouraged patient to continue to monitor her diet and not let her down moments deter her progress . Assessed patient's education and care coordination needs Patient received education material . Patient understands that as she works on her caloric intake, reduction of weight, medication adherence and exercise it will help to improve her  HLD. Marland Kitchen Provided basic disease specific education to patient  . Collaborated with appropriate clinical care team members regarding patient needs  . 08/05/19 . Patient states that she is still exercising,  She is debating if she is going to put a hold on her member ship and exercise at home . She states that she weighed and she has gained 2 lbs  . She has been researching a 1500 calorie diet and taking some idea of food choices from it. . Late night snacking is still a issue,  She has a craving for salt,  discussed the problems with eating sodium in the  diet. . Patient states that she had been having headaches for the last few days but today no HA. Marland Kitchen She had cut back on water but she is increasing back to what she was drinking before.  . 09/03/19 . Patient states that she is still having some issues with eating.  She find that it is becoming hard to prep for meals and when it is busy and she is out she can use the "easy button" and stop by a fast food place.  We discussed how common it is.  We also discussed about how to restart on her journey to lose pounds slowly. . She is still craving salt and eating chips at night.  We discuss other alternatives that she can try. . She continues to drink water. . She continues to exercise  30 minuets  and will walk 2 miles with her grandchild she had to look into other alternative because at the [facility] some of her classes had been canceled . She checks her blood sugars once monthly and has been noticing a slight increase from 116, 117, 118 so that has made her more determined to do better because she does not want to be on medication . I help sign her up on the [place] waiting list for the covid -19 shot she was very appreciative. . Call CCM team with any questions or concerns  09/17/19 o Patient states that she has gained 1 pound  o She continues to have cravings at  night for salty foods o She has had problems with her knee and has not exercised in 2 weeks, she is going to ortho to get an injection o She states that she has been having problems with her bowels as well she is using Miralax. Colace and drinking h2o o Patient states that she has had her first injection for Covid and will have the 2nd one in March o Patient is interested in talking with the nutritionist in the office.  I have reached out to her and will give her the information at our next call. 10/01/19  o Spoke with the patient and she states that she has lost 3 lbs. o She had a tooth worked on and had to have stitches removed today.  She  has only been able to eat soft foods.  She states she has been eating spinach and eggs.  Tonight, she plans on eating a baked potato. o She continues to drink water o She states that her knees are feeling better.  She went to see the orthopedist and was given injections in both of her knees, he told her that she will need to have knee replacements.  She states that she has been able to exercise two times since then with aerobic step at the center and some dancing with you tube. o Gave information to the patient about the nutritionist at the clinic [facility]: [phone] [facility] schedules her own appointments and will give her all the information when she speaks with her. o 10/15/19 o Patient states that she has not called the nutritionist yet.  She is waiting  she feels that she has a lot of the information but just needs to act upon it. o She still has craving for salty foods at night.  Talked about strategies = ration portions of snack foods, not buying them at all, trying replacement foods. o She does not have a problem with meals o She is still exercising= walking 21/2 miles  classes at the [facility] o Trying to get the mindset that it is not a diet but a way of life = a life style change. o She is taking her medications and staying hydrated  o 11/12/19 o Patient states that her weight is at 210 lbs.  Her goal is to be 188 lbs. o She would like to lose at least 1-2 lbs a week o Patient is noticing triggers for foods that are unhealthy o She has her issues with snacks at night .  She likes salty snakes like chips.   o We discussed alternatives.= edamame, kale chips, nuts and seeds, popcorn o Not to over indulge, take one day at a time o Continue to drink water o She has signed up for exercise class 3 day/week o She quit step aerobics because it was affecting her knee  o She states that she is noticing she has cramps in her thighs.  Advised the patient to schedule an appointment with Dr Gwendlyn Deutscher  to discuss issues  and for blood work.  She stated that she would. o 11/26/19 o Patient states she I doing well she has lost 1/2 pound o She is working out 3 days/week o She is being more mindful of what she is eating at night and how much she is eating o She is drinking water but not as much as she should o Her plan is to stay focused on exercise and eating correctly o She is still planning to make an appointment with Dr  Eniola to discuss her cramping in her thighs everytime she exercises.    Patient Self Care Activities related to HLD and Obesity :  . Patient is unable to independently self-manage chronic health conditions  Please see past updates related to this goal by clicking on the "Past Updates" button in the selected goal         [name] was given information about Care Management services today including:  1. Care Management services include personalized support from designated clinical staff supervised by her physician, including individualized plan of care and coordination with other care providers 2. 24/7 contact phone numbers for assistance for urgent and routine care needs. 3. The patient may stop CCM services at any time (effective at the end of the month) by phone call to the office staff.  Patient agreed to services and verbal consent obtained.   The patient verbalized understanding of instructions provided today and declined a print copy of patient instruction materials.   The care management team will reach out to the patient again over the next 14 days.  The patient has been provided with contact information for the care management team and has been advised to call with any health related questions or concerns.   Lazaro Arms RN, BSN, [facility] Care Management Coordinator [place] Phone: [phone] Fax: [phone]

## 2019-11-26 NOTE — Chronic Care Management (AMB) (Signed)
Care Management   Follow Up Note   11/26/2019 Name: Angie [name] MRN: [id number] DOB: 1946/04/13  Referred by: [name], MD Reason for referral : Care Coordination (Care Management RNCM Obesity/HDL)   Angie [name] is a 75 y.o. year old female who is a primary care patient of [name], MD. The care management team was consulted for assistance with care management and care coordination needs.    Review of patient status, including review of consultants reports, relevant laboratory and other test results, and collaboration with appropriate care team members and the patient's provider was performed as part of comprehensive patient evaluation and provision of chronic care management services.    SDOH (Social Determinants of Health) assessments performed: No See Care Plan activities for detailed interventions related to [facility])     Advanced Directives: See Care Plan and Vynca application for related entries.   Goals Addressed            This Visit's Progress   . "I am trying to stay healthy" (pt-stated)       Current Barriers:  . Chronic Disease Management support, education, and care coordination needs related to HLD and Obesity  Clinical Goal(s) related to HLD and Obesity :  Over the next 90 days, patient will:  . Work with the care management team to address educational, disease management, and care coordination needs  . Begin self health monitoring activities as directed today . Call provider office for new or worsened signs and symptoms . Call care management team with questions or concerns . Verbalize basic understanding of patient centered plan of care established today  Interventions related to HLD and Obesity :  . Evaluation of current treatment plans and patient's adherence to plan as established by provider . Assessed patient understanding of disease states Patient states that she understand what she needs to do but has good and bad days  accomplishing her goals with diet  Encouraged patient to continue to monitor her diet and not let her down moments deter her progress . Assessed patient's education and care coordination needs Patient received education material . Patient understands that as she works on her caloric intake, reduction of weight, medication adherence and exercise it will help to improve her  HLD. Marland Kitchen Provided basic disease specific education to patient  . Collaborated with appropriate clinical care team members regarding patient needs  . 08/05/19 . Patient states that she is still exercising,  She is debating if she is going to put a hold on her member ship and exercise at home . She states that she weighed and she has gained 2 lbs  . She has been researching a 1500 calorie diet and taking some idea of food choices from it. . Late night snacking is still a issue,  She has a craving for salt,  discussed the problems with eating sodium in the diet. . Patient states that she had been having headaches for the last few days but today no HA. Marland Kitchen She had cut back on water but she is increasing back to what she was drinking before.  . 09/03/19 . Patient states that she is still having some issues with eating.  She find that it is becoming hard to prep for meals and when it is busy and she is out she can use the "easy button" and stop by a fast food place.  We discussed how common it is.  We also discussed about how to restart on her journey to lose  pounds slowly. . She is still craving salt and eating chips at night.  We discuss other alternatives that she can try. . She continues to drink water. . She continues to exercise  30 minuets  and will walk 2 miles with her grandchild she had to look into other alternative because at the [facility] some of her classes had been canceled . She checks her blood sugars once monthly and has been noticing a slight increase from 116, 117, 118 so that has made her more determined to do better because  she does not want to be on medication . I help sign her up on the [place] waiting list for the covid -19 shot she was very appreciative. . Call CCM team with any questions or concerns  09/17/19 o Patient states that she has gained 1 pound  o She continues to have cravings at night for salty foods o She has had problems with her knee and has not exercised in 2 weeks, she is going to ortho to get an injection o She states that she has been having problems with her bowels as well she is using Miralax. Colace and drinking h2o o Patient states that she has had her first injection for Covid and will have the 2nd one in March o Patient is interested in talking with the nutritionist in the office.  I have reached out to her and will give her the information at our next call. 10/01/19  o Spoke with the patient and she states that she has lost 3 lbs. o She had a tooth worked on and had to have stitches removed today.  She has only been able to eat soft foods.  She states she has been eating spinach and eggs.  Tonight, she plans on eating a baked potato. o She continues to drink water o She states that her knees are feeling better.  She went to see the orthopedist and was given injections in both of her knees, he told her that she will need to have knee replacements.  She states that she has been able to exercise two times since then with aerobic step at the center and some dancing with you tube. o Gave information to the patient about the nutritionist at the clinic [facility]: [phone] [facility] schedules her own appointments and will give her all the information when she speaks with her. o 10/15/19 o Patient states that she has not called the nutritionist yet.  She is waiting  she feels that she has a lot of the information but just needs to act upon it. o She still has craving for salty foods at night.  Talked about strategies = ration portions of snack foods, not buying them at all, trying  replacement foods. o She does not have a problem with meals o She is still exercising= walking 21/2 miles  classes at the [facility] o Trying to get the mindset that it is not a diet but a way of life = a life style change. o She is taking her medications and staying hydrated  o 11/12/19 o Patient states that her weight is at 210 lbs.  Her goal is to be 188 lbs. o She would like to lose at least 1-2 lbs a week o Patient is noticing triggers for foods that are unhealthy o She has her issues with snacks at night .  She likes salty snakes like chips.   o We discussed alternatives.= edamame, kale chips, nuts and seeds, popcorn o  Not to over indulge, take one day at a time o Continue to drink water o She has signed up for exercise class 3 day/week o She quit step aerobics because it was affecting her knee  o She states that she is noticing she has cramps in her thighs.  Advised the patient to schedule an appointment with Dr Gwendlyn Deutscher to discuss issues  and for blood work.  She stated that she would. o 11/26/19 o Patient states she I doing well she has lost 1/2 pound o She is working out 3 days/week o She is being more mindful of what she is eating at night and how much she is eating o She is drinking water but not as much as she should o Her plan is to stay focused on exercise and eating correctly o She is still planning to make an appointment with Dr Gwendlyn Deutscher to discuss her cramping in her thighs everytime she exercises.    Patient Self Care Activities related to HLD and Obesity :  . Patient is unable to independently self-manage chronic health conditions  Please see past updates related to this goal by clicking on the "Past Updates" button in the selected goal          The care management team will reach out to the patient again over the next 14 days.  The patient has been provided with contact information for the care management team and has been advised to call with any health related questions  or concerns.   Lazaro Arms RN, BSN, [facility] Care Management Coordinator [place] Phone: [phone] Fax: [phone]

## 2019-12-10 ENCOUNTER — Other Ambulatory Visit: Payer: Self-pay

## 2019-12-10 ENCOUNTER — Ambulatory Visit: Payer: Medicare Other

## 2019-12-17 ENCOUNTER — Telehealth: Payer: Medicare Other

## 2019-12-21 NOTE — Patient Instructions (Signed)
Visit Information  Goals Addressed            This Visit's Progress   . "I am trying to stay healthy" (pt-stated)       Current Barriers:  . Chronic Disease Management support, education, and care coordination needs related to HLD and Obesity  Clinical Goal(s) related to HLD and Obesity :  Over the next 90 days, patient will:  . Work with the care management team to address educational, disease management, and care coordination needs  . Begin self health monitoring activities as directed today . Call provider office for new or worsened signs and symptoms . Call care management team with questions or concerns . Verbalize basic understanding of patient centered plan of care established today  Interventions related to HLD and Obesity :  . Evaluation of current treatment plans and patient's adherence to plan as established by provider . Assessed patient understanding of disease states Patient states that she understand what she needs to do but has good and bad days accomplishing her goals with diet  Encouraged patient to continue to monitor her diet and not let her down moments deter her progress . Assessed patient's education and care coordination needs Patient received education material . Patient understands that as she works on her caloric intake, reduction of weight, medication adherence and exercise it will help to improve her  HLD. Marland Kitchen Provided basic disease specific education to patient  . Collaborated with appropriate clinical care team members regarding patient needs  o 12/10/19 o Patient states she I doing well she is at her same weight o She walked 1.07 miles and will be  doing step aerobic tonight o She is being more mindful of what she is eating at night and how much she is eating o She is drinking water but not as much as she should o Her plan is to stay focused on exercise and eating correctly o She is still planning to make an appointment with Dr Gwendlyn Deutscher to discuss her  cramping in her thighs everytime she exercises. o She tried the kale chips but did not like the taste o She is going to try other alternatives that will help with her snaking at night that will cut down on her salt intake    Patient Self Care Activities related to HLD and Obesity :  . Patient is unable to independently self-manage chronic health conditions  Please see past updates related to this goal by clicking on the "Past Updates" button in the selected goal         [name] was given information about Care Management services today including:  1. Care Management services include personalized support from designated clinical staff supervised by her physician, including individualized plan of care and coordination with other care providers 2. 24/7 contact phone numbers for assistance for urgent and routine care needs. 3. The patient may stop CCM services at any time (effective at the end of the month) by phone call to the office staff.  Patient agreed to services and verbal consent obtained.   The patient verbalized understanding of instructions provided today and declined a print copy of patient instruction materials.   The care management team will reach out to the patient again over the next 14 days.  The patient has been provided with contact information for the care management team and has been advised to call with any health related questions or concerns.   Lazaro Arms RN, BSN, [place]  [place] Phone: [phone] Fax: [phone]

## 2019-12-21 NOTE — Chronic Care Management (AMB) (Signed)
Care Management   Follow Up Note   12/21/2019 Name: Angie [name] MRN: [id number] DOB: 24-Sep-1945  Referred by: [name], MD Reason for referral : Care Coordination (Care Management RNCM  Obesity/HDL)   [name] is a 74 y.o. year old female who is a primary care patient of [name], MD. The care management team was consulted for assistance with care management and care coordination needs.    Review of patient status, including review of consultants reports, relevant laboratory and other test results, and collaboration with appropriate care team members and the patient's provider was performed as part of comprehensive patient evaluation and provision of chronic care management services.    SDOH (Social Determinants of Health) assessments performed: No See Care Plan activities for detailed interventions related to [facility])     Advanced Directives: See Care Plan and Vynca application for related entries.   Goals Addressed            This Visit's Progress   . "I am trying to stay healthy" (pt-stated)       Current Barriers:  . Chronic Disease Management support, education, and care coordination needs related to HLD and Obesity  Clinical Goal(s) related to HLD and Obesity :  Over the next 90 days, patient will:  . Work with the care management team to address educational, disease management, and care coordination needs  . Begin self health monitoring activities as directed today . Call provider office for new or worsened signs and symptoms . Call care management team with questions or concerns . Verbalize basic understanding of patient centered plan of care established today  Interventions related to HLD and Obesity :  . Evaluation of current treatment plans and patient's adherence to plan as established by provider . Assessed patient understanding of disease states Patient states that she understand what she needs to do but has good and bad days  accomplishing her goals with diet  Encouraged patient to continue to monitor her diet and not let her down moments deter her progress . Assessed patient's education and care coordination needs Patient received education material . Patient understands that as she works on her caloric intake, reduction of weight, medication adherence and exercise it will help to improve her  HLD. Marland Kitchen Provided basic disease specific education to patient  . Collaborated with appropriate clinical care team members regarding patient needs  o 12/10/19 o Patient states she I doing well she is at her same weight o She walked 1.07 miles and will be  doing step aerobic tonight o She is being more mindful of what she is eating at night and how much she is eating o She is drinking water but not as much as she should o Her plan is to stay focused on exercise and eating correctly o She is still planning to make an appointment with Dr Angie [name] to discuss her cramping in her thighs everytime she exercises. o She tried the kale chips but did not like the taste o She is going to try other alternatives that will help with her snaking at night that will cut down on her salt intake    Patient Self Care Activities related to HLD and Obesity :  . Patient is unable to independently self-manage chronic health conditions  Please see past updates related to this goal by clicking on the "Past Updates" button in the selected goal          The care management team will reach out to  the patient again over the next 14 days.  The patient has been provided with contact information for the care management team and has been advised to call with any health related questions or concerns.   Lazaro Arms RN, BSN, [facility] Care Management Coordinator [place] Phone: [phone] Fax: [phone]

## 2019-12-24 ENCOUNTER — Other Ambulatory Visit: Payer: Self-pay

## 2019-12-24 ENCOUNTER — Ambulatory Visit: Payer: Medicare Other

## 2019-12-24 NOTE — Patient Instructions (Signed)
Visit Information  Goals Addressed            This Visit's Progress   . "I am trying to stay healthy" (pt-stated)       Current Barriers:  . Chronic Disease Management support, education, and care coordination needs related to HLD and Obesity  Clinical Goal(s) related to HLD and Obesity :  Over the next 90 days, patient will:  . Work with the care management team to address educational, disease management, and care coordination needs  . Begin self health monitoring activities as directed today . Call provider office for new or worsened signs and symptoms . Call care management team with questions or concerns . Verbalize basic understanding of patient centered plan of care established today  Interventions related to HLD and Obesity :  . Evaluation of current treatment plans and patient's adherence to plan as established by provider . Assessed patient understanding of disease states Patient states that she understand what she needs to do but has good and bad days accomplishing her goals with diet  Encouraged patient to continue to monitor her diet and not let her down moments deter her progress . Assessed patient's education and care coordination needs Patient received education material . Patient understands that as she works on her caloric intake, reduction of weight, medication adherence and exercise it will help to improve her  HLD. Marland Kitchen Provided basic disease specific education to patient  . Collaborated with appropriate clinical care team members regarding patient needs  o 12/24/19 o Patient states she is having some anxiety with the calls and would like to move them to every 2 months for now o She has a lot going on in her life at the moment and just needs some time to think.  She understands that she needs a mindset about the food and feels she is the only one that can due that. o She states that she did make an appointment with Dr. Gwendlyn Deutscher next week because of a Yeast infection under  her arm and her toes are tingling and she wants that checked o Patient has my number if there are any questions or concerns  Patient Self Care Activities related to HLD and Obesity :  . Patient is unable to independently self-manage chronic health conditions  Please see past updates related to this goal by clicking on the "Past Updates" button in the selected goal         Angie [name] was given information about Care Management services today including:  1. Care Management services include personalized support from designated clinical staff supervised by her physician, including individualized plan of care and coordination with other care providers 2. 24/7 contact phone numbers for assistance for urgent and routine care needs. 3. The patient may stop CCM services at any time (effective at the end of the month) by phone call to the office staff.  Patient agreed to services and verbal consent obtained.   The patient verbalized understanding of instructions provided today and declined a print copy of patient instruction materials.   RNCM will follow up with the patient within the next.2 months per patient request.  Lazaro Arms RN, BSN, [facility] Care Management Coordinator [place] Phone: [phone] Fax: [phone]

## 2019-12-24 NOTE — Chronic Care Management (AMB) (Signed)
Care Management   Follow Up Note   12/24/2019 Name: Angie [name] MRN: [id number] DOB: 01-22-1946  Referred by: Angie Feil, MD Reason for referral : Care Coordination (Care Management RNCM Obesity /HDL)   Angie [name] is a 74 y.o. year old female who is a primary care patient of Angie Feil, MD. The care management team was consulted for assistance with care management and care coordination needs.    Review of patient status, including review of consultants reports, relevant laboratory and other test results, and collaboration with appropriate care team members and the patient's provider was performed as part of comprehensive patient evaluation and provision of chronic care management services.    SDOH (Social Determinants of Health) assessments performed: No See Care Plan activities for detailed interventions related to [facility])     Advanced Directives: See Care Plan and Vynca application for related entries.   Goals Addressed            This Visit's Progress   . "I am trying to stay healthy" (pt-stated)       Current Barriers:  . Chronic Disease Management support, education, and care coordination needs related to HLD and Obesity  Clinical Goal(s) related to HLD and Obesity :  Over the next 90 days, patient will:  . Work with the care management team to address educational, disease management, and care coordination needs  . Begin self health monitoring activities as directed today . Call provider office for new or worsened signs and symptoms . Call care management team with questions or concerns . Verbalize basic understanding of patient centered plan of care established today  Interventions related to HLD and Obesity :  . Evaluation of current treatment plans and patient's adherence to plan as established by provider . Assessed patient understanding of disease states Patient states that she understand what she needs to do but has good and bad days  accomplishing her goals with diet  Encouraged patient to continue to monitor her diet and not let her down moments deter her progress . Assessed patient's education and care coordination needs Patient received education material . Patient understands that as she works on her caloric intake, reduction of weight, medication adherence and exercise it will help to improve her  HLD. Marland Kitchen Provided basic disease specific education to patient  . Collaborated with appropriate clinical care team members regarding patient needs  o 12/24/19 o Patient states she is having some anxiety with the calls and would like to move them to every 2 months for now o She has a lot going on in her life at the moment and just needs some time to think.  She understands that she needs a mindset about the food and feels she is the only one that can due that. o She states that she did make an appointment with Dr. Gwendlyn [name] next week because of a Yeast infection under her arm and her toes are tingling and she wants that checked o Patient has my number if there are any questions or concerns  Patient Self Care Activities related to HLD and Obesity :  . Patient is unable to independently self-manage chronic health conditions  Please see past updates related to this goal by clicking on the "Past Updates" button in the selected goal          RNCM will follow up with the patient within the next 2 months per patient request  Lazaro Arms RN, BSN, [place]  [facility] Phone: [phone] Fax: [phone]

## 2019-12-29 ENCOUNTER — Encounter: Payer: Self-pay | Admitting: Family Medicine

## 2019-12-29 DIAGNOSIS — Z9889 Other specified postprocedural states: Secondary | ICD-10-CM | POA: Insufficient documentation

## 2019-12-30 ENCOUNTER — Other Ambulatory Visit: Payer: Self-pay

## 2019-12-30 ENCOUNTER — Encounter: Payer: Self-pay | Admitting: Family Medicine

## 2019-12-30 ENCOUNTER — Other Ambulatory Visit: Payer: Self-pay | Admitting: Family Medicine

## 2019-12-30 ENCOUNTER — Ambulatory Visit (INDEPENDENT_AMBULATORY_CARE_PROVIDER_SITE_OTHER): Payer: Medicare Other | Admitting: Family Medicine

## 2019-12-30 VITALS — BP 130/78 | HR 76 | Ht 62.0 in | Wt 214.0 lb

## 2019-12-30 DIAGNOSIS — E785 Hyperlipidemia, unspecified: Secondary | ICD-10-CM

## 2019-12-30 DIAGNOSIS — R7309 Other abnormal glucose: Secondary | ICD-10-CM

## 2019-12-30 DIAGNOSIS — R7303 Prediabetes: Secondary | ICD-10-CM

## 2019-12-30 DIAGNOSIS — E669 Obesity, unspecified: Secondary | ICD-10-CM | POA: Diagnosis not present

## 2019-12-30 DIAGNOSIS — J330 Polyp of nasal cavity: Secondary | ICD-10-CM | POA: Diagnosis not present

## 2019-12-30 DIAGNOSIS — I10 Essential (primary) hypertension: Secondary | ICD-10-CM | POA: Diagnosis not present

## 2019-12-30 DIAGNOSIS — E2839 Other primary ovarian failure: Secondary | ICD-10-CM | POA: Diagnosis not present

## 2019-12-30 DIAGNOSIS — Z6839 Body mass index (BMI) 39.0-39.9, adult: Secondary | ICD-10-CM | POA: Diagnosis not present

## 2019-12-30 DIAGNOSIS — E66812 Obesity, class 2: Secondary | ICD-10-CM

## 2019-12-30 LAB — POCT GLYCOSYLATED HEMOGLOBIN (HGB A1C): HbA1c, POC (controlled diabetic range): 5.9 % (ref 0.0–7.0)

## 2019-12-30 MED ORDER — TRIAMCINOLONE ACETONIDE 0.5 % EX CREA: 1.0000 "application " | TOPICAL_CREAM | Freq: Two times a day (BID) | CUTANEOUS | 0 refills | Status: DC

## 2019-12-30 MED ORDER — LEVOCETIRIZINE DIHYDROCHLORIDE 5 MG PO TABS: 5.0000 mg | ORAL_TABLET | Freq: Every evening | ORAL | 1 refills | Status: DC

## 2019-12-30 NOTE — Assessment & Plan Note (Signed)
A1C of 5.9 during this visit. Improved from her last A1C of 6 Continue diet and exercise for weight control. Monitor closely for now.

## 2019-12-30 NOTE — Assessment & Plan Note (Signed)
BP stable on her current regimen. Cmet recommended. She will return for lab. Future lab ordered today. Lab appointment scheduled.

## 2019-12-30 NOTE — Assessment & Plan Note (Signed)
Compliant with meds. She will return for FLP profile. Future order placed. Lab appointment scheduled during this visit.

## 2019-12-30 NOTE — Patient Instructions (Signed)
Bone Scan, Care After This sheet gives you information about how to care for yourself after your procedure. Your health care provider may also give you more specific instructions. If you have problems or questions, contact your health care provider. What can I expect after the procedure? After the procedure, it is common to have soreness where your IV was inserted. Follow these instructions at home:   Drink enough fluid to keep your urine pale yellow. This helps to flush the radioactive substance (radiotracer) out of your body. It will take several days for your body to get rid of all the radiotracer.  Return to your normal activities as told by your health care provider. Ask your health care provider what activities are safe for you.  Take over-the-counter and prescription medicines only as told by your health care provider.  If you are breastfeeding, give your baby either formula or breast milk that you pumped before the scan. Do this for 3 days. ? Throw away any breast milk that you pump during the 3 days after your scan. ? Return to breastfeeding as told by your health care provider. Contact a health care provider if you:  Have redness, swelling, or pain at your IV site.  Develop a rash. Get help right away if you:  Have chest pain.  Have trouble breathing. Summary  After a bone scan, it is common to have soreness where your IV was inserted.  Carefully follow instructions from your health care provider, including when you can return to normal activities and how much fluid you should drink.  Take over-the-counter and prescription medicines only as told by your health care provider. This information is not intended to replace advice given to you by your health care provider. Make sure you discuss any questions you have with your health care provider. Document Revised: 05/14/2017 Document Reviewed: 05/14/2017 Elsevier Patient Education  2020 [employer].

## 2019-12-30 NOTE — Assessment & Plan Note (Signed)
Body mass index is 39.14 kg/m. Diet and exercise counseling. She is interested in losing weight.  I recommended a reduction in caloric intake, advised regarding diet, snacking and exercise.  Referral to Nutrition Services and/or Bariatric Services was not made for this appointment F/U in 4-6 months for reassessment and weight management.

## 2019-12-30 NOTE — Assessment & Plan Note (Signed)
Likely contributing to her allergy symptoms. Trial of Xyzal for allergy. I referred her to ENT for assessment given hx of difficulty with breathing and symptoms persisting x 3 yrs despite conservative measures. She agreed with the plan.

## 2019-12-30 NOTE — Progress Notes (Signed)
    SUBJECTIVE:   CHIEF COMPLAINT / HPI:   Rash: C/O rash on her right arm. The rash started a few days ago, pretty itchy. Only use Dove soap. No change in lotion or diet. No contact. No treatment so far.  PreDM/Weight/HTN/HLD: Here for f/u. She tries to exercise as tolerated. Exercise hurts her bone.  Her diet is ok for the most part, except for nighttime snacking. She checks her CBG twice a month ranges from 100-128. She is compliant with HCTZ 25 mg qd for her BP.Pravachol 20 mg qd for HLD.  Nasal/sinus issue: Here to f/u for persistent nasal congestion. Singulair does not work. She tried Claritin in the past with no improvement. She has difficulty breathing in one nose compared to the other and she snores at night on and off per her husband.  PERTINENT  PMH / PSH: PMX reviewed.  OBJECTIVE:   BP 130/78   Pulse 76   Ht 5\' 2"  (1.575 m)   Wt 214 lb (97.1 kg)   SpO2 95%   BMI 39.14 kg/m    Physical Exam Vitals and nursing note reviewed.  HENT:     Nose: No nasal deformity, septal deviation, mucosal edema or rhinorrhea.     Comments: Mildly enlarged nasal turbinates, no inflammation    Mouth/Throat:     Pharynx: Oropharynx is clear.     Tonsils: No tonsillar exudate.  Cardiovascular:     Rate and Rhythm: Normal rate and regular rhythm.     Heart sounds: Normal heart sounds. No murmur.  Pulmonary:     Effort: Pulmonary effort is normal. No respiratory distress.     Breath sounds: Normal breath sounds. No wheezing or rhonchi.  Abdominal:     General: Abdomen is flat. Bowel sounds are normal. There is no distension.     Palpations: Abdomen is soft. There is no mass.     Tenderness: There is no abdominal tenderness.  Musculoskeletal:     Right lower leg: No edema.     Left lower leg: No edema.  Skin:    Comments: Very mild, barely visibile hyperpigmented macular lesion on her right inner upper arm, a few inches away from her armpit.      ASSESSMENT/PLAN:   Skin lesion:  ?? Contact dermatitis. Trial of topical steroid. Triamcinolone prescribed.   Pre-diabetes A1C of 5.9 during this visit. Improved from her last A1C of 6 Continue diet and exercise for weight control. Monitor closely for now.  HYPERTENSION, BENIGN SYSTEMIC BP stable on her current regimen. Cmet recommended. She will return for lab. Future lab ordered today. Lab appointment scheduled.  Hyperlipidemia Compliant with meds. She will return for FLP profile. Future order placed. Lab appointment scheduled during this visit.  Obesity Body mass index is 39.14 kg/m. Diet and exercise counseling. She is interested in losing weight.  I recommended a reduction in caloric intake, advised regarding diet, snacking and exercise.  Referral to Nutrition Services and/or Bariatric Services was not made for this appointment F/U in 4-6 months for reassessment and weight management.    Nasal polyp, benign Likely contributing to her allergy symptoms. Trial of Xyzal for allergy. I referred her to ENT for assessment given hx of difficulty with breathing and symptoms persisting x 3 yrs despite conservative measures. She agreed with the plan.   In addition, Dexa scan ordered for bone density eval. Instruction given on how to schedule her appointment.  Andrena Mews, MD [place]

## 2020-01-07 ENCOUNTER — Other Ambulatory Visit: Payer: Medicare Other

## 2020-01-07 ENCOUNTER — Other Ambulatory Visit: Payer: Self-pay

## 2020-01-07 ENCOUNTER — Other Ambulatory Visit: Payer: Self-pay | Admitting: *Deleted

## 2020-01-07 DIAGNOSIS — I10 Essential (primary) hypertension: Secondary | ICD-10-CM | POA: Diagnosis not present

## 2020-01-07 DIAGNOSIS — E785 Hyperlipidemia, unspecified: Secondary | ICD-10-CM | POA: Diagnosis not present

## 2020-01-08 ENCOUNTER — Telehealth: Payer: Self-pay | Admitting: Family Medicine

## 2020-01-08 LAB — LIPID PANEL
Chol/HDL Ratio: 3.4 ratio (ref 0.0–4.4)
Cholesterol, Total: 214 mg/dL — ABNORMAL HIGH (ref 100–199)
HDL: 63 mg/dL (ref >39)
LDL Chol Calc (NIH): 129 mg/dL — ABNORMAL HIGH (ref 0–99)
Triglycerides: 122 mg/dL (ref 0–149)
VLDL Cholesterol Cal: 22 mg/dL (ref 5–40)

## 2020-01-08 LAB — CMP14+EGFR
ALT: 20 IU/L (ref 0–32)
AST: 22 IU/L (ref 0–40)
Albumin/Globulin Ratio: 1.9 (ref 1.2–2.2)
Albumin: 4.3 g/dL (ref 3.7–4.7)
Alkaline Phosphatase: 82 IU/L (ref 48–121)
BUN/Creatinine Ratio: 22 (ref 12–28)
BUN: 16 mg/dL (ref 8–27)
Bilirubin Total: 0.7 mg/dL (ref 0.0–1.2)
CO2: 25 mmol/L (ref 20–29)
Calcium: 9.7 mg/dL (ref 8.7–10.3)
Chloride: 102 mmol/L (ref 96–106)
Creatinine, Ser: 0.72 mg/dL (ref 0.57–1.00)
GFR calc Af Amer: 97 mL/min/{1.73_m2} (ref >59)
GFR calc non Af Amer: 84 mL/min/{1.73_m2} (ref >59)
Globulin, Total: 2.3 g/dL (ref 1.5–4.5)
Glucose: 122 mg/dL — ABNORMAL HIGH (ref 65–99)
Potassium: 3.9 mmol/L (ref 3.5–5.2)
Sodium: 142 mmol/L (ref 134–144)
Total Protein: 6.6 g/dL (ref 6.0–8.5)

## 2020-01-08 MED ORDER — PREGABALIN 75 MG PO CAPS: 75.0000 mg | ORAL_CAPSULE | Freq: Two times a day (BID) | ORAL | 1 refills | Status: DC

## 2020-01-08 MED ORDER — PRAVASTATIN SODIUM 20 MG PO TABS: 20.0000 mg | ORAL_TABLET | ORAL | 1 refills | Status: DC

## 2020-01-08 NOTE — Telephone Encounter (Signed)
Discussed results.  Chol and LDL elevated but mostly unchanged from the previous.  Option given to go back to daily Stain vs every other day or work on diet change.  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   She is reluctant going to daily statin regimen. She prefers lifestyle modification.

## 2020-01-14 ENCOUNTER — Other Ambulatory Visit: Payer: Self-pay | Admitting: Family Medicine

## 2020-01-14 DIAGNOSIS — Z1231 Encounter for screening mammogram for malignant neoplasm of breast: Secondary | ICD-10-CM

## 2020-02-25 ENCOUNTER — Telehealth: Payer: Medicare Other

## 2020-02-25 ENCOUNTER — Other Ambulatory Visit: Payer: Self-pay

## 2020-02-25 ENCOUNTER — Telehealth: Payer: Self-pay

## 2020-02-25 NOTE — Telephone Encounter (Signed)
  Care Management   Outreach Note  02/25/2020 Name: Angie [name] MRN: [id number] DOB: October 02, 1945  Referred by: [name], MD Reason for referral : No chief complaint on file.  RNCM reach out to the  Patient today and she was unable to talk.  She was in the dentist office and stated she wanted to talk at another time.  Follow Up Plan: The care management team will reach out to the patient again over the next 7-14 days.   Lazaro Arms RN, BSN, [facility] Care Management Coordinator [place] Phone: [phone] Fax: [phone]

## 2020-02-27 ENCOUNTER — Ambulatory Visit
Admission: RE | Admit: 2020-02-27 | Discharge: 2020-02-27 | Disposition: A | Discharge status: Home / Self Care | Payer: Medicare Other | Source: Ambulatory Visit | Attending: Family Medicine | Admitting: Family Medicine

## 2020-02-27 ENCOUNTER — Telehealth: Payer: Self-pay | Admitting: Family Medicine

## 2020-02-27 ENCOUNTER — Other Ambulatory Visit: Payer: Self-pay

## 2020-02-27 DIAGNOSIS — Z780 Asymptomatic menopausal state: Secondary | ICD-10-CM | POA: Diagnosis not present

## 2020-02-27 DIAGNOSIS — Z1231 Encounter for screening mammogram for malignant neoplasm of breast: Secondary | ICD-10-CM | POA: Diagnosis not present

## 2020-02-27 DIAGNOSIS — E2839 Other primary ovarian failure: Secondary | ICD-10-CM

## 2020-02-27 NOTE — Telephone Encounter (Signed)
Bone scan discussed with her. Calcium and Vitamin D supplement discussed. All questions were answered.   Mammogram report still pending. She is aware that I will call her if her mammogram is abnormal, otherwise, I will send her a message via her MyChart account.

## 2020-03-15 NOTE — Telephone Encounter (Signed)
Spoke with patient she wanted to wait a month for a call to FU with RN CM. Patient is scheduled for 04/13/2020.

## 2020-04-02 ENCOUNTER — Other Ambulatory Visit: Payer: Self-pay

## 2020-04-02 MED ORDER — LEVOCETIRIZINE DIHYDROCHLORIDE 5 MG PO TABS: 5.0000 mg | ORAL_TABLET | Freq: Every evening | ORAL | 1 refills | Status: DC

## 2020-04-05 DIAGNOSIS — Z20822 Contact with and (suspected) exposure to covid-19: Secondary | ICD-10-CM | POA: Diagnosis not present

## 2020-04-05 DIAGNOSIS — Z03818 Encounter for observation for suspected exposure to other biological agents ruled out: Secondary | ICD-10-CM | POA: Diagnosis not present

## 2020-04-13 ENCOUNTER — Ambulatory Visit: Payer: Medicare Other

## 2020-04-13 NOTE — Chronic Care Management (AMB) (Signed)
  Care Management   Follow Up Note   04/13/2020 Name: Angie [name] MRN: [id number] DOB: 04-Jan-1946  Referred by: [name], MD Reason for referral : Chronic Care Management (obesity/hdl)   Angie [name] is a 74 y.o. year old female who is a primary care patient of [name], MD. The care management team was consulted for assistance with care management and care coordination needs.    Review of patient status, including review of consultants reports, relevant laboratory and other test results, and collaboration with appropriate care team members and the patient's provider was performed as part of comprehensive patient evaluation and provision of chronic care management services.    SDOH (Social Determinants of Health) assessments performed: No See Care Plan activities for detailed interventions related to [facility])     Advanced Directives: See Care Plan and Vynca application for related entries.   Goals Addressed              This Visit's Progress   .  "I am trying to stay healthy" (pt-stated)        Current Barriers:  . Chronic Disease Management support, education, and care coordination needs related to HLD and Obesity  Clinical Goal(s) related to HLD and Obesity :  Over the next 90 days, patient will:  . Work with the care management team to address educational, disease management, and care coordination needs  . Begin self health monitoring activities as directed today . Call provider office for new or worsened signs and symptoms . Call care management team with questions or concerns . Verbalize basic understanding of patient centered plan of care established today  Interventions related to HLD and Obesity :  . Evaluation of current treatment plans and patient's adherence to plan as established by provider . Assessed patient understanding of disease states Patient states that she understand what she needs to do but has good and bad days  accomplishing her goals with diet  Encouraged patient to continue to monitor her diet and not let her down moments deter her progress . Assessed patient's education and care coordination needs Patient received education material . Patient understands that as she works on her caloric intake, reduction of weight, medication adherence and exercise it will help to improve her  HLD. Marland Kitchen Provided basic disease specific education to patient  . Collaborated with appropriate clinical care team members regarding patient needs  o Spoke with the patient and she states that sh is back going to the [facility] and exercising, and walking. o She is meal prepping with a friend.  They have picked Sundays to fix the meals. o She also states that she has start a program "Med not Meds" programs o Patient requested that she would like for me to follow up with her in 1 month. o  Patient Self Care Activities related to HLD and Obesity :  . Patient is unable to independently self-manage chronic health conditions  Please see past updates related to this goal by clicking on the "Past Updates" button in the selected goal          The care management team will reach out to the patient again over the next 30 days.   Lazaro Arms RN, BSN, [facility] Care Management Coordinator [place] Phone: [phone] Fax: [phone]

## 2020-04-13 NOTE — Patient Instructions (Signed)
Visit Information  Goals Addressed              This Visit's Progress   .  "I am trying to stay healthy" (pt-stated)        Current Barriers:  . Chronic Disease Management support, education, and care coordination needs related to HLD and Obesity  Clinical Goal(s) related to HLD and Obesity :  Over the next 90 days, patient will:  . Work with the care management team to address educational, disease management, and care coordination needs  . Begin self health monitoring activities as directed today . Call provider office for new or worsened signs and symptoms . Call care management team with questions or concerns . Verbalize basic understanding of patient centered plan of care established today  Interventions related to HLD and Obesity :  . Evaluation of current treatment plans and patient's adherence to plan as established by provider . Assessed patient understanding of disease states Patient states that she understand what she needs to do but has good and bad days accomplishing her goals with diet  Encouraged patient to continue to monitor her diet and not let her down moments deter her progress . Assessed patient's education and care coordination needs Patient received education material . Patient understands that as she works on her caloric intake, reduction of weight, medication adherence and exercise it will help to improve her  HLD. Marland Kitchen Provided basic disease specific education to patient  . Collaborated with appropriate clinical care team members regarding patient needs  o Spoke with the patient and she states that sh is back going to the [facility] and exercising, and walking. o She is meal prepping with a friend.  They have picked Sundays to fix the meals. o She also states that she has start a program "Med not Meds" programs o Patient requested that she would like for me to follow up with her in 1 month. o  Patient Self Care Activities related to HLD and Obesity :  . Patient is  unable to independently self-manage chronic health conditions  Please see past updates related to this goal by clicking on the "Past Updates" button in the selected goal         [name] was given information about Care Management services today including:  1. Care Management services include personalized support from designated clinical staff supervised by her physician, including individualized plan of care and coordination with other care providers 2. 24/7 contact phone numbers for assistance for urgent and routine care needs. 3. The patient may stop CCM services at any time (effective at the end of the month) by phone call to the office staff.  Patient agreed to services and verbal consent obtained.   The patient verbalized understanding of instructions provided today and declined a print copy of patient instruction materials.   The care management team will reach out to the patient again over the next 30 days.   Lazaro Arms RN, BSN, [facility] Care Management Coordinator [place] Phone: [phone] Fax: [phone]

## 2020-05-13 ENCOUNTER — Ambulatory Visit: Payer: Medicare Other

## 2020-05-13 NOTE — Patient Instructions (Signed)
Visit Information  Goals Addressed              This Visit's Progress   .  "I am trying to stay healthy" (pt-stated)        Current Barriers:  . Chronic Disease Management support, education, and care coordination needs related to HLD and Obesity  Clinical Goal(s) related to HLD and Obesity :  Over the next 90 days, patient will:  . Work with the care management team to address educational, disease management, and care coordination needs  . Begin self health monitoring activities as directed today . Call provider office for new or worsened signs and symptoms . Call care management team with questions or concerns . Verbalize basic understanding of patient centered plan of care established today  Interventions related to HLD and Obesity :  . Evaluation of current treatment plans and patient's adherence to plan as established by provider . Assessed patient understanding of disease states Patient states that she understand what she needs to do but has good and bad days accomplishing her goals with diet  Encouraged patient to continue to monitor her diet and not let her down moments deter her progress . Assessed patient's education and care coordination needs Patient received education material . Patient understands that as she works on her caloric intake, reduction of weight, medication adherence and exercise it will help to improve her  HLD. Marland Kitchen Provided basic disease specific education to patient  . Collaborated with appropriate clinical care team members regarding patient needs  o Spoke with the patient and she is currently working on her and her husband's  will and other important papers and it has been a little stressful.  So she has not been on her regular schedule o She states that her habits have been fluctuating .  She gained 11/2 lbs in a month her FBS was 122.  Which is not bad but it is nt where she would like it. o She has been having some hip pain.  This happens occassionally ,  and when it does she feels like she is going in circles.  Advised her to take one day at a time . We have setbacks but she should not let that stop her from starting back again. o  Patient Self Care Activities related to HLD and Obesity :  . Patient is unable to independently self-manage chronic health conditions  Please see past updates related to this goal by clicking on the "Past Updates" button in the selected goal         Angie [name] was given information about Care Management services today including:  1. Care Management services include personalized support from designated clinical staff supervised by her physician, including individualized plan of care and coordination with other care providers 2. 24/7 contact phone numbers for assistance for urgent and routine care needs. 3. The patient may stop CCM services at any time (effective at the end of the month) by phone call to the office staff.  Patient agreed to services and verbal consent obtained.   The patient verbalized understanding of instructions provided today and declined a print copy of patient instruction materials.   Plan: Telephone follow up with Angie [name] over the next 30 days.   Lazaro Arms RN, BSN, [facility] Care Management Coordinator [place] Phone: [phone] Fax: [phone]

## 2020-05-13 NOTE — Chronic Care Management (AMB) (Signed)
RN  Care Management   Follow Up Note   05/13/2020 Name: Angie [name] MRN: [id number] DOB: 1946/06/06  Reason for referral : Chronic Care Management (Obesity/HDL)   Angie [name] is a 74 y.o. year old female who is a primary care patient of Angie Feil, MD. The care management team was consulted for assistance with care management and care coordination needs.    Subjective:" I am doing fair"  Objective: . Lab Results  Component Value Date   HGBA1C 5.9 12/30/2019   Lab Results  Component Value Date   CHOL 214 (H) 01/07/2020   HDL 63 01/07/2020   LDLCALC 129 (H) 01/07/2020   LDLDIRECT 121 (H) 12/09/2007   TRIG 122 01/07/2020   CHOLHDL 3.4 01/07/2020   Wt Readings from Last 3 Encounters:  12/30/19 214 lb (97.1 kg)  03/25/19 209 lb (94.8 kg)  08/08/18 211 lb (95.7 kg)       Assessment: Called the ptient to follow up on her weight loss and diet.  She is trying to handle some family business at the moment that is stressful and she is  unable to focus at the moment.  Goals Addressed              This Visit's Progress   .  "I am trying to stay healthy" (pt-stated)        Current Barriers:  . Chronic Disease Management support, education, and care coordination needs related to HLD and Obesity  Clinical Goal(s) related to HLD and Obesity :  Over the next 90 days, patient will:  . Work with the care management team to address educational, disease management, and care coordination needs  . Begin self health monitoring activities as directed today . Call provider office for new or worsened signs and symptoms . Call care management team with questions or concerns . Verbalize basic understanding of patient centered plan of care established today  Interventions related to HLD and Obesity :  . Evaluation of current treatment plans and patient's adherence to plan as established by provider . Assessed patient understanding of disease states Patient states  that she understand what she needs to do but has good and bad days accomplishing her goals with diet  Encouraged patient to continue to monitor her diet and not let her down moments deter her progress . Assessed patient's education and care coordination needs Patient received education material . Patient understands that as she works on her caloric intake, reduction of weight, medication adherence and exercise it will help to improve her  HLD. Marland Kitchen Provided basic disease specific education to patient  . Collaborated with appropriate clinical care team members regarding patient needs  o Spoke with the patient and she is currently working on her and her husband's  will and other important papers and it has been a little stressful.  So she has not been on her regular schedule o She states that her habits have been fluctuating .  She gained 11/2 lbs in a month her FBS was 122.  Which is not bad but it is nt where she would like it. o She has been having some hip pain.  This happens occassionally , and when it does she feels like she is going in circles.  Advised her to take one day at a time . We have setbacks but she should not let that stop her from starting back again. o  Patient Self Care Activities related to HLD and Obesity :  . Patient  is unable to independently self-manage chronic health conditions  Please see past updates related to this goal by clicking on the "Past Updates" button in the selected goal         Review of patient status, including review of consultants reports, relevant laboratory and other test results, and collaboration with appropriate care team members and the patient's provider was performed as part of comprehensive patient evaluation and provision of chronic care management services.    SDOH (Social Determinants of Health) assessments performed: No See Care Plan activities for detailed interventions related to [facility])         Plan: Telephone follow up with Angie  [name] over the next 30 days.   Lazaro Arms RN, BSN, [facility] Care Management Coordinator [place] Phone: [phone] Fax: [phone]

## 2020-05-26 DIAGNOSIS — Z23 Encounter for immunization: Secondary | ICD-10-CM | POA: Diagnosis not present

## 2020-06-02 DIAGNOSIS — M1611 Unilateral primary osteoarthritis, right hip: Secondary | ICD-10-CM | POA: Diagnosis not present

## 2020-06-10 ENCOUNTER — Ambulatory Visit: Payer: Medicare Other

## 2020-06-10 NOTE — Chronic Care Management (AMB) (Signed)
RN  Care Management   Follow Up Note   06/10/2020 Name: Angie [name] MRN: [id number] DOB: 04-Nov-1945  Reason for referral : Chronic Care Management (Obesity HDL)   Angie [name] is a 74 y.o. year old female who is a primary care patient of [name], MD. The care management team was consulted for assistance with care management and care coordination needs.      Assessment: Called to follow up on weight loss.  The patient states that she has been doing ok but has been having some trouble with her hip.  Patient Care Plan: Obesity (Adult)  Problem Identified: Weight Management (Obesity)   Long-Range Goal: Weight Management   Start Date: 06/10/2020  Expected End Date: 08/10/2020  Note:    Current Barriers:  Marland Kitchen Knowledge Deficits related to weight loss strategies as evidenced by weight gain reported/observed and/or altered eating habits . Chronic Disease Management support and education needs related to weight management in patient with HLD  Nurse Case Manager Clinical Goal(s):  Marland Kitchen Over the next 90 days, patient will verbalize basic understanding of plan for initiation of weight loss strategies  IInterventions:  . Evaluation of current treatment plan related to weight loss and patient's adherence to plan as established by provider . Provided verbal and/or written education to patient re: recommended life style changes: avoid fad diets, make small/incremental dietary and exercise changes, eat at the table and avoid eating in front of the TV, plan management of cravings, monitor snacking and cravings in food diary . Discussed plans with patient for ongoing care management follow up and provided patient with direct contact information for care management team . Reviewed scheduled/upcoming provider appointments including: . barriers to physical activity or exercise addressed- patient states that she loves to eat salty chips at night . encouragement and support  provided . incremental change encouraged- discussed making small changes at a time . mutual realistic weight loss goal set- 1 to 2 lbs a week . 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  She is scheduled to have a Cortizone injection in her hip 06/11/20. She states that is hurts in her groin and affects her exercising, getting in and out of her vehicle.  She has put her [employer] on hold for now.  She will try some lit You tube exercises.  Marland Kitchen benefits of weight loss explored . difficulty of making life-long changes acknowledged . set-back potential acknowledged . weight-related stigma acknowledged  Patient Goals/Self Care Activities: . Patient verbalizes understanding of plan Self-administers medications as prescribed . Calls pharmacy for medication refills . Call's provider office for new concerns or questions . begin a weight loss diary . get rid of junk food . identify pros and cons of weight loss . identify what might get in the way of success . stock up on healthy food choices . track why I am eating . set weight loss goal   Follow up Plan: The care management team will reach out to the patient again over the next 30 days.        Review of patient status, including review of consultants reports, relevant laboratory and other test results, and collaboration with appropriate care team members and the patient's provider was performed as part of comprehensive patient evaluation and provision of chronic care management services.    SDOH (Social Determinants of Health) assessments performed: No See Care Plan activities for detailed interventions related to [facility])  Lazaro Arms RN, BSN, [facility] Care Management Coordinator [place] Phone: [phone] Fax: [phone]

## 2020-06-10 NOTE — Patient Instructions (Signed)
Visit Information  Goals Addressed              This Visit's Progress   .  "I am trying to stay healthy" (pt-stated)        Patient Goals/Self Care Activities: . Patient verbalizes understanding of plan Self-administers medications as prescribed . Calls pharmacy for medication refills . Call's provider office for new concerns or questions . begin a weight loss diary . get rid of junk food . identify pros and cons of weight loss . identify what might get in the way of success . stock up on healthy food choices . track why I am eating . set weight loss goal       [name] was given information about Care Management services today including:  1. Care Management services include personalized support from designated clinical staff supervised by her physician, including individualized plan of care and coordination with other care providers 2. 24/7 contact phone numbers for assistance for urgent and routine care needs. 3. The patient may stop CCM services at any time (effective at the end of the month) by phone call to the office staff.  Patient agreed to services and verbal consent obtained.   The patient verbalized understanding of instructions, educational materials, and care plan provided today and declined offer to receive copy of patient instructions, educational materials, and care plan.   The care management team will reach out to the patient again over the next 30 days.   Lazaro Arms RN, BSN, [facility] Care Management Coordinator [place] Phone: [phone] Fax: [phone]

## 2020-06-11 DIAGNOSIS — M1611 Unilateral primary osteoarthritis, right hip: Secondary | ICD-10-CM | POA: Diagnosis not present

## 2020-07-05 ENCOUNTER — Encounter: Payer: Self-pay | Admitting: Family Medicine

## 2020-07-06 ENCOUNTER — Encounter: Payer: Self-pay | Admitting: Family Medicine

## 2020-07-06 ENCOUNTER — Ambulatory Visit (INDEPENDENT_AMBULATORY_CARE_PROVIDER_SITE_OTHER): Payer: Medicare Other | Admitting: Family Medicine

## 2020-07-06 ENCOUNTER — Other Ambulatory Visit: Payer: Self-pay

## 2020-07-06 VITALS — BP 122/76 | HR 76 | Ht 62.0 in | Wt 213.0 lb

## 2020-07-06 DIAGNOSIS — I10 Essential (primary) hypertension: Secondary | ICD-10-CM | POA: Diagnosis not present

## 2020-07-06 DIAGNOSIS — R7303 Prediabetes: Secondary | ICD-10-CM | POA: Diagnosis not present

## 2020-07-06 DIAGNOSIS — R7309 Other abnormal glucose: Secondary | ICD-10-CM

## 2020-07-06 DIAGNOSIS — Z23 Encounter for immunization: Secondary | ICD-10-CM

## 2020-07-06 DIAGNOSIS — E785 Hyperlipidemia, unspecified: Secondary | ICD-10-CM | POA: Diagnosis not present

## 2020-07-06 LAB — POCT GLYCOSYLATED HEMOGLOBIN (HGB A1C): Hemoglobin A1C: 6.1 % — AB (ref 4.0–5.6)

## 2020-07-06 MED ORDER — TETANUS-DIPHTH-ACELL PERTUSSIS 5-2.5-18.5 LF-MCG/0.5 IM SUSP: 0.5000 mL | Freq: Once | INTRAMUSCULAR | 0 refills | Status: AC

## 2020-07-06 MED ORDER — ZOSTER VAC RECOMB ADJUVANTED 50 MCG/0.5ML IM SUSR: 0.5000 mL | Freq: Once | INTRAMUSCULAR | 1 refills | Status: AC

## 2020-07-06 NOTE — Progress Notes (Signed)
    SUBJECTIVE:   CHIEF COMPLAINT / HPI:   PreDM: Here for f/u.  HLD/HTN:Compliant with HCTZ 25 mg qd for her HTN and Pravachol 20 mg every other day for her cholesterol. She is here for f/u.  Weight management:She is working hard on her diet control and exercise for weight loss. She is upset about weight gain.  PERTINENT  PMH / PSH: PMX reviewed.  OBJECTIVE:   Vitals:   07/06/20 1053  BP: 122/76  Pulse: 76  SpO2: 98%  Weight: 213 lb (96.6 kg)  Height: 5\' 2"  (1.575 m)    Physical Exam Vitals and nursing note reviewed.  Cardiovascular:     Rate and Rhythm: Normal rate and regular rhythm.     Heart sounds: Normal heart sounds. No murmur heard.   Pulmonary:     Effort: Pulmonary effort is normal. No respiratory distress.     Breath sounds: Normal breath sounds. No wheezing.  Abdominal:     General: Abdomen is flat. Bowel sounds are normal. There is no distension.     Palpations: Abdomen is soft. There is no mass.     Tenderness: There is no abdominal tenderness.       ASSESSMENT/PLAN:   Pre-diabetes A1C of 6.1 today. Continue to work on diet and exercise. Repeat in 1 yr.  HYPERTENSION, BENIGN SYSTEMIC BP stable on her current regimen.  Hyperlipidemia She is compliant with every other day Pravachol. FLP checked today. I will contact her with the result.  Morbid obesity (HCC) BMI>35 plus HTN and PreDM. Diet and exercise counseling done. I discussed referral to a dietician. She is interested provided her insurance will cover it. I placed referral and reached out to Dr. Jenne Campus regarding insurance coverage. I discussed Weekly Semaglutide injection for weight management. She is interested and will consider starting this agent in January of 2022. She will reach out when she is ready to start this treatment.   Vaccine updated - flu shot. She will get her Tdap and Shingrix at her pharmacy. Both escribed.  Andrena Mews, MD [place]

## 2020-07-06 NOTE — Assessment & Plan Note (Signed)
A1C of 6.1 today. Continue to work on diet and exercise. Repeat in 1 yr.

## 2020-07-06 NOTE — Assessment & Plan Note (Signed)
She is compliant with every other day Pravachol. FLP checked today. I will contact her with the result.

## 2020-07-06 NOTE — Assessment & Plan Note (Signed)
BMI>35 plus HTN and PreDM. Diet and exercise counseling done. I discussed referral to a dietician. She is interested provided her insurance will cover it. I placed referral and reached out to Dr. Jenne Campus regarding insurance coverage. I discussed Weekly Semaglutide injection for weight management. She is interested and will consider starting this agent in January of 2022. She will reach out when she is ready to start this treatment.

## 2020-07-06 NOTE — Assessment & Plan Note (Signed)
BP stable on her current regimen.

## 2020-07-06 NOTE — Patient Instructions (Signed)
Semaglutide injection solution What is this medicine? SEMAGLUTIDE (Sem a GLOO tide) is used to improve blood sugar control in adults with type 2 diabetes. This medicine may be used with other diabetes medicines. This drug may also reduce the risk of heart attack or stroke if you have type 2 diabetes and risk factors for heart disease. This medicine may be used for other purposes; ask your health care provider or pharmacist if you have questions. COMMON BRAND NAME(S): OZEMPIC What should I tell my health care provider before I take this medicine? They need to know if you have any of these conditions:  endocrine tumors (MEN 2) or if someone in your family had these tumors  eye disease, vision problems  history of pancreatitis  kidney disease  stomach problems  thyroid cancer or if someone in your family had thyroid cancer  an unusual or allergic reaction to semaglutide, other medicines, foods, dyes, or preservatives  pregnant or trying to get pregnant  breast-feeding How should I use this medicine? This medicine is for injection under the skin of your upper leg (thigh), stomach area, or upper arm. It is given once every week (every 7 days). You will be taught how to prepare and give this medicine. Use exactly as directed. Take your medicine at regular intervals. Do not take it more often than directed. If you use this medicine with insulin, you should inject this medicine and the insulin separately. Do not mix them together. Do not give the injections right next to each other. Change (rotate) injection sites with each injection. It is important that you put your used needles and syringes in a special sharps container. Do not put them in a trash can. If you do not have a sharps container, call your pharmacist or healthcare provider to get one. A special MedGuide will be given to you by the pharmacist with each prescription and refill. Be sure to read this information carefully each  time. This drug comes with INSTRUCTIONS FOR USE. Ask your pharmacist for directions on how to use this drug. Read the information carefully. Talk to your pharmacist or health care provider if you have questions. Talk to your pediatrician regarding the use of this medicine in children. Special care may be needed. Overdosage: If you think you have taken too much of this medicine contact a poison control center or emergency room at once. NOTE: This medicine is only for you. Do not share this medicine with others. What if I miss a dose? If you miss a dose, take it as soon as you can within 5 days after the missed dose. Then take your next dose at your regular weekly time. If it has been longer than 5 days after the missed dose, do not take the missed dose. Take the next dose at your regular time. Do not take double or extra doses. If you have questions about a missed dose, contact your health care provider for advice. What may interact with this medicine?  other medicines for diabetes Many medications may cause changes in blood sugar, these include:  alcohol containing beverages  antiviral medicines for HIV or AIDS  aspirin and aspirin-like drugs  certain medicines for blood pressure, heart disease, irregular heart beat  chromium  diuretics  female hormones, such as estrogens or progestins, birth control pills  fenofibrate  gemfibrozil  isoniazid  lanreotide  female hormones or anabolic steroids  MAOIs like Carbex, Eldepryl, Marplan, Nardil, and Parnate  medicines for weight loss  medicines for   allergies, asthma, cold, or cough  medicines for depression, anxiety, or psychotic disturbances  niacin  nicotine  NSAIDs, medicines for pain and inflammation, like ibuprofen or naproxen  octreotide  pasireotide  pentamidine  phenytoin  probenecid  quinolone antibiotics such as ciprofloxacin, levofloxacin, ofloxacin  some herbal dietary supplements  steroid medicines  such as prednisone or cortisone  sulfamethoxazole; trimethoprim  thyroid hormones Some medications can hide the warning symptoms of low blood sugar (hypoglycemia). You may need to monitor your blood sugar more closely if you are taking one of these medications. These include:  beta-blockers, often used for high blood pressure or heart problems (examples include atenolol, metoprolol, propranolol)  clonidine  guanethidine  reserpine This list may not describe all possible interactions. Give your health care provider a list of all the medicines, herbs, non-prescription drugs, or dietary supplements you use. Also tell them if you smoke, drink alcohol, or use illegal drugs. Some items may interact with your medicine. What should I watch for while using this medicine? Visit your doctor or health care professional for regular checks on your progress. Drink plenty of fluids while taking this medicine. Check with your doctor or health care professional if you get an attack of severe diarrhea, nausea, and vomiting. The loss of too much body fluid can make it dangerous for you to take this medicine. A test called the HbA1C (A1C) will be monitored. This is a simple blood test. It measures your blood sugar control over the last 2 to 3 months. You will receive this test every 3 to 6 months. Learn how to check your blood sugar. Learn the symptoms of low and high blood sugar and how to manage them. Always carry a quick-source of sugar with you in case you have symptoms of low blood sugar. Examples include hard sugar candy or glucose tablets. Make sure others know that you can choke if you eat or drink when you develop serious symptoms of low blood sugar, such as seizures or unconsciousness. They must get medical help at once. Tell your doctor or health care professional if you have high blood sugar. You might need to change the dose of your medicine. If you are sick or exercising more than usual, you might need  to change the dose of your medicine. Do not skip meals. Ask your doctor or health care professional if you should avoid alcohol. Many nonprescription cough and cold products contain sugar or alcohol. These can affect blood sugar. Pens should never be shared. Even if the needle is changed, sharing may result in passing of viruses like hepatitis or HIV. Wear a medical ID bracelet or chain, and carry a card that describes your disease and details of your medicine and dosage times. Do not become pregnant while taking this medicine. Women should inform their doctor if they wish to become pregnant or think they might be pregnant. There is a potential for serious side effects to an unborn child. Talk to your health care professional or pharmacist for more information. What side effects may I notice from receiving this medicine? Side effects that you should report to your doctor or health care professional as soon as possible:  allergic reactions like skin rash, itching or hives, swelling of the face, lips, or tongue  breathing problems  changes in vision  diarrhea that continues or is severe  lump or swelling on the neck  severe nausea  signs and symptoms of infection like fever or chills; cough; sore throat; pain or trouble   passing urine  signs and symptoms of low blood sugar such as feeling anxious, confusion, dizziness, increased hunger, unusually weak or tired, sweating, shakiness, cold, irritable, headache, blurred vision, fast heartbeat, loss of consciousness  signs and symptoms of kidney injury like trouble passing urine or change in the amount of urine  trouble swallowing  unusual stomach upset or pain  vomiting Side effects that usually do not require medical attention (report to your doctor or health care professional if they continue or are bothersome):  constipation  diarrhea  nausea  pain, redness, or irritation at site where injected  stomach upset This list may not  describe all possible side effects. Call your doctor for medical advice about side effects. You may report side effects to FDA at 1-800-FDA-1088. Where should I keep my medicine? Keep out of the reach of children. Store unopened pens in a refrigerator between 2 and 8 degrees C (36 and 46 degrees F). Do not freeze. Protect from light and heat. After you first use the pen, it can be stored for 56 days at room temperature between 15 and 30 degrees C (59 and 86 degrees F) or in a refrigerator. Throw away your used pen after 56 days or after the expiration date, whichever comes first. Do not store your pen with the needle attached. If the needle is left on, medicine may leak from the pen. NOTE: This sheet is a summary. It may not cover all possible information. If you have questions about this medicine, talk to your doctor, pharmacist, or health care provider.  2020 Elsevier/Gold Standard (2019-03-25 09:41:51)  

## 2020-07-07 ENCOUNTER — Telehealth: Payer: Self-pay | Admitting: Family Medicine

## 2020-07-07 LAB — LIPID PANEL
Chol/HDL Ratio: 3.2 ratio (ref 0.0–4.4)
Cholesterol, Total: 227 mg/dL — ABNORMAL HIGH (ref 100–199)
HDL: 70 mg/dL (ref >39)
LDL Chol Calc (NIH): 136 mg/dL — ABNORMAL HIGH (ref 0–99)
Triglycerides: 120 mg/dL (ref 0–149)
VLDL Cholesterol Cal: 21 mg/dL (ref 5–40)

## 2020-07-07 MED ORDER — ESOMEPRAZOLE MAGNESIUM 20 MG PO CPDR: 20.0000 mg | DELAYED_RELEASE_CAPSULE | Freq: Every day | ORAL | 3 refills | Status: DC | PRN

## 2020-07-07 MED ORDER — LEVOCETIRIZINE DIHYDROCHLORIDE 5 MG PO TABS: 5.0000 mg | ORAL_TABLET | Freq: Every evening | ORAL | 3 refills | Status: DC

## 2020-07-07 MED ORDER — HYDROCHLOROTHIAZIDE 25 MG PO TABS: 25.0000 mg | ORAL_TABLET | Freq: Every day | ORAL | 3 refills | Status: DC

## 2020-07-07 MED ORDER — PREGABALIN 75 MG PO CAPS: 75.0000 mg | ORAL_CAPSULE | Freq: Two times a day (BID) | ORAL | 3 refills | Status: DC

## 2020-07-07 MED ORDER — SUMATRIPTAN SUCCINATE 25 MG PO TABS: ORAL_TABLET | ORAL | 3 refills | Status: DC

## 2020-07-07 MED ORDER — VALACYCLOVIR HCL 500 MG PO TABS: 500.0000 mg | ORAL_TABLET | Freq: Every day | ORAL | 3 refills | Status: DC

## 2020-07-07 MED ORDER — TRAZODONE HCL 50 MG PO TABS: 25.0000 mg | ORAL_TABLET | Freq: Every evening | ORAL | 3 refills | Status: DC | PRN

## 2020-07-07 NOTE — Telephone Encounter (Signed)
Lab worsened.  Per the patient, she has been compliant with her Pravachol. Upon further review, it seems last refill must have been in July of this year.   I already discussed going back to daily Pravachol vs every other day Crestor (which she prefers).  Upon realizing that she might have been off Pravachol for a while, I discussed keeping Pravachol, but she still prefers a switch to Crestor.  I agree to encourage compliance.  Plan:  Switch Pravachol to every other day Crestor.  Note that yesterday she mentioned that she has itchy skin with no rash. This was attributed to seasonal allergy and I recommended continuation of her Xyzal. Today she stated she just noticed a dark rash in the area and started using her Triamcinolone. She also has Clobetasol at home.  I advised d/c Triamcinolone and stick with Clobetasol instead. F/U soon if rash worsens despite treatment.  She agreed with the plan.

## 2020-07-12 ENCOUNTER — Other Ambulatory Visit: Payer: Self-pay | Admitting: Family Medicine

## 2020-07-12 DIAGNOSIS — R7303 Prediabetes: Secondary | ICD-10-CM

## 2020-07-13 ENCOUNTER — Telehealth: Payer: Self-pay

## 2020-07-13 ENCOUNTER — Telehealth: Payer: Medicare Other

## 2020-07-13 DIAGNOSIS — Z20822 Contact with and (suspected) exposure to covid-19: Secondary | ICD-10-CM | POA: Diagnosis not present

## 2020-07-13 NOTE — Telephone Encounter (Signed)
  Care Management     RN Outreach Note  07/13/2020 Name: Lailyn Reid-White MRN: [id number] DOB: 03-12-1946  Akira Reid-White is a 74 y.o. year old female who is a primary care patient of [name], MD . [place] reached out to [place] today by phone for chronic care management.  The patient stated that she was not available to talk at the time and would give me a call back.    Follow Up Plan: RNCM will give the patient time with in the next 2 weeks if no return call RNCM will follow up with the patient.    Lazaro Arms RN, BSN, [facility] Care Management Coordinator [place] Phone: [phone] I Fax: [phone]

## 2020-07-26 NOTE — Addendum Note (Signed)
Addended by: Jone Baseman D on: 07/26/2020 05:25 PM   Modules accepted: Orders

## 2020-07-30 ENCOUNTER — Telehealth: Payer: Medicare Other

## 2020-07-30 ENCOUNTER — Telehealth: Payer: Self-pay

## 2020-07-30 NOTE — Telephone Encounter (Signed)
  Care Management   Outreach Note  07/30/2020 Name: Angie [name] MRN: [id number] DOB: 09-13-1945  Referred by: [name], MD Reason for referral : Chronic Care Management (Obesity HDL)   Angie [name] is enrolled in a Managed Medicaid Health Plan: No  A second unsuccessful telephone outreach was attempted today. The patient was referred to the case management team for assistance with care management and care coordination.   Follow Up Plan: A HIPAA compliant phone message was left for the patient providing contact information and requesting a return call.  The care management team will reach out to the patient again over the next 7-14 days.   Lazaro Arms RN, BSN, [facility] Care Management Coordinator [place] Phone: [phone] I Fax: [phone]

## 2020-08-03 NOTE — Telephone Encounter (Signed)
Spoke with patient rescheduled for 08/10/2020   [place] Management

## 2020-08-10 ENCOUNTER — Ambulatory Visit: Payer: Medicare Other

## 2020-08-10 ENCOUNTER — Other Ambulatory Visit: Payer: Self-pay | Admitting: Family Medicine

## 2020-08-10 NOTE — Patient Instructions (Signed)
[name]  it was nice speaking with you. Please call me directly [phone] if you have questions about the goals we discussed.  Goals Addressed              This Visit's Progress   .  "I am trying to stay healthy" (pt-stated)        Timeframe:  Long-Range Goal Priority:  High Start Date:    06/09/2019                         Expected End Date:     11/20/20                   Patient Goals/Self Care Activities: . Patient verbalizes understanding of plan Self-administers medications as prescribed . Calls pharmacy for medication refills . Call's provider office for new concerns or questions . begin a weight loss diary . get rid of junk food . identify pros and cons of weight loss . identify what might get in the way of success . stock up on healthy food choices . track why I am eating . set weight loss goal        Patient Care Plan: Obesity (Adult)  Problem Identified: Weight Management (Obesity)  Long-Range Goal: Weight Management   Start Date: 06/10/2020  Expected End Date: 10/08/2020  Note:    Current Barriers:  Marland Kitchen Knowledge Deficits related to weight loss strategies as evidenced by weight gain reported/observed and/or altered eating habits . Chronic Disease Management support and education needs related to weight management in patient with HLD  Nurse Case Manager Clinical Goal(s):  Marland Kitchen Over the next 90 days, patient will verbalize basic understanding of plan for initiation of weight loss strategies  IInterventions:  . Evaluation of current treatment plan related to weight loss and patient's adherence to plan as established by provider . Provided verbal and/or written education to patient re: recommended life style changes: avoid fad diets, make small/incremental dietary and exercise changes, eat at the table and avoid eating in front of the TV, plan management of cravings, monitor snacking and cravings in food diary . Discussed plans with patient for ongoing  care management follow up and provided patient with direct contact information for care management team . Reviewed scheduled/upcoming provider appointments including: . barriers to physical activity or exercise addressed- patient states being accountable for the food and exercise . encouragement and support provided . incremental change encouraged-discussed making small changes one at a time . mutual realistic weight loss goal set- 1 to 2 lbs a week . pain assessed and managed- Patient states that she is still having problems exercising when she goes to the gym. She has more pain in her body.  Especially her hips.   Plus now that covid has picked up she is staying in the home and doing more workouts.  She is going to talk with Dr Jenne Campus a nutritionist today a 6 pm.    She plans to start a class that she will weigh in weekly and she feels that she will be kept accountable that way.  This class will last for a year. . benefits of weight loss explored . difficulty of making life-long changes acknowledged . set-back potential acknowledged . weight-related stigma acknowledged  Patient Goals/Self Care Activities: . Patient verbalizes understanding of plan Self-administers medications as prescribed . Calls pharmacy for medication refills . Call's provider office for new concerns or questions . begin a weight loss diary .  get rid of junk food . identify pros and cons of weight loss . identify what might get in the way of success . stock up on healthy food choices . track why I am eating . set weight loss goal         [name] received Care Management services today:  1. Care Management services include personalized support from designated clinical staff supervised by her physician, including individualized plan of care and coordination with other care providers 2. 24/7 contact [phone] for assistance for urgent and routine care needs. 3. Care Management are voluntary services and be  declined at any time by calling the office.  The patient verbalized understanding of instructions provided today and declined a print copy of patient instruction materials.    Lazaro Arms, RN

## 2020-08-10 NOTE — Chronic Care Management (AMB) (Signed)
Care Management    RN Visit Note  08/10/2020 Name: Angie [name] MRN: [id number] DOB: [birth year]  Subjective: Angie [name] is a 75 y.o. year old female who is a primary care patient of Angie Feil, MD. The care management team was consulted for assistance with disease management and care coordination needs.    Engaged with patient by telephone for follow up visit in response to provider referral for case management and/or care coordination services.   Consent to Services:   Angie [name] was given information about Care Management services today including:  1. Care Management services includes personalized support from designated clinical staff supervised by her physician, including individualized plan of care and coordination with other care providers 2. 24/7 contact phone numbers for assistance for urgent and routine care needs. 3. The patient may stop case management services at any time by phone call to the office staff.  Patient agreed to services and consent obtained.    Assessment: Patient continues to experience difficulty with weight loss.  She still feel that she needs accountability to help her loos weight.. See Care Plan below for interventions and patient self-care actives. Follow up Plan: Patient would like continued follow-up.  CCM RNCM will outreach the patient with in the next 30 days.. Patient will call office if needed prior to next encounter  Review of patient past medical history, allergies, medications, health status, including review of consultants reports, laboratory and other test data, was performed as part of comprehensive evaluation and provision of chronic care management services.   SDOH (Social Determinants of Health) assessments and interventions performed:    Care Plan  No Known Allergies  Outpatient Encounter Medications as of 08/10/2020  Medication Sig Note  . Biotin 10000 MCG TABS Take 1 tablet by mouth daily.   . clobetasol  cream (TEMOVATE) 5.62 % Apply 1 application topically 2 (two) times daily.   . cycloSPORINE (RESTASIS) 0.05 % ophthalmic emulsion Place 1 drop into both eyes 2 (two) times daily. 06/06/2019: Bid daily   . docusate sodium (COLACE) 100 MG capsule Take 1 capsule (100 mg total) by mouth 2 (two) times daily.   Marland Kitchen esomeprazole (NEXIUM) 20 MG capsule Take 1 capsule (20 mg total) by mouth daily as needed.   . hydrochlorothiazide (HYDRODIURIL) 25 MG tablet Take 1 tablet (25 mg total) by mouth daily.   Marland Kitchen levocetirizine (XYZAL) 5 MG tablet Take 1 tablet (5 mg total) by mouth every evening.   . meclizine (ANTIVERT) 12.5 MG tablet Take 1 tablet (12.5 mg total) by mouth 3 (three) times daily as needed for dizziness. (Patient not taking: No sig reported)   . Multiple Vitamin (MULTIVITAMIN) capsule Take 1 capsule by mouth daily.   . multivitamin-lutein (OCUVITE-LUTEIN) CAPS capsule Take 1 capsule by mouth daily. (Patient not taking: Reported on 07/06/2020)   . OVER THE COUNTER MEDICATION Take 1 tablet by mouth daily. Calcium 500mg , Magnesium 500mg , Potassium 99mg    . OVER THE COUNTER MEDICATION Take 60 mLs by mouth as needed (hip pain). 06/06/2019: Organic tart cherry juice quarter of a cup a day  . pravastatin (PRAVACHOL) 20 MG tablet TAKE ONE TABLET BY MOUTH EVERY OTHER DAY   . pregabalin (LYRICA) 75 MG capsule Take 1 capsule (75 mg total) by mouth 2 (two) times daily.   . SUMAtriptan (IMITREX) 25 MG tablet take one tablet at start of headache. May repeat in 2 hours x 1 if needed   . tiZANidine (ZANAFLEX) 4 MG tablet Take 4 mg by  mouth at bedtime as needed for muscle spasms.   . traMADol (ULTRAM) 50 MG tablet Take 1-2 tablets (50-100 mg total) by mouth every 6 (six) hours as needed (pain).   . traZODone (DESYREL) 50 MG tablet Take 0.5 tablets (25 mg total) by mouth at bedtime as needed for sleep.   . valACYclovir (VALTREX) 500 MG tablet Take 1 tablet (500 mg total) by mouth daily.    No facility-administered  encounter medications on file as of 08/10/2020.    Patient Active Problem List   Diagnosis Date Noted  . H/O colonoscopy 12/29/2019  . BPV (benign positional vertigo) 03/25/2019  . Spondylolisthesis of lumbar region 08/08/2018  . Nasal polyp, benign 03/30/2017  . Pre-diabetes 10/13/2016  . GERD (gastroesophageal reflux disease) 06/22/2015  . Hot flash, menopausal 06/22/2015  . Herpes simplex 04/11/2011  . Hyperlipidemia 02/07/2011  . Carpal tunnel syndrome 09/01/2009  . Lichen sclerosus of female genitalia 05/21/2009  . VARICOSE VEINS, LOWER EXTREMITIES, MILD 04/26/2009  . SYSTOLIC MURMUR [id number]  . Morbid obesity ([place]) 09/20/2006  . HYPERTENSION, BENIGN SYSTEMIC 09/20/2006  . DIVERTICULOSIS OF COLON 09/20/2006    Conditions to be addressed/monitored: DMII  Care Plan : Obesity (Adult)  Updates made by Lazaro Arms, RN since 08/10/2020 12:00 AM  Problem: Weight Management (Obesity)   Long-Range Goal: Weight Management   Start Date: 06/10/2020  Expected End Date: 10/08/2020   Current Barriers:  Marland Kitchen Knowledge Deficits related to weight loss strategies as evidenced by weight gain reported/observed and/or altered eating habits . Chronic Disease Management support and education needs related to weight management in patient with HLD  Nurse Case Manager Clinical Goal(s):  Marland Kitchen Over the next 90 days, patient will verbalize basic understanding of plan for initiation of weight loss strategies  IInterventions:  . Evaluation of current treatment plan related to weight loss and patient's adherence to plan as established by provider . Provided verbal and/or written education to patient re: recommended life style changes: avoid fad diets, make small/incremental dietary and exercise changes, eat at the table and avoid eating in front of the TV, plan management of cravings, monitor snacking and cravings in food diary . Discussed plans with patient for ongoing care management follow up and provided  patient with direct contact information for care management team . Reviewed scheduled/upcoming provider appointments including: . barriers to physical activity or exercise addressed- patient states being accountable for the food and exercise . encouragement and support provided . incremental change encouraged-discussed making small changes one at a time . mutual realistic weight loss goal set- 1 to 2 lbs a week . pain assessed and managed- Patient states that she is still having problems exercising when she goes to the gym. She has more pain in her body.  Especially her hips.   Plus now that covid has picked up she is staying in the home and doing more workouts.  She is going to talk with Dr Jenne Campus a nutritionist today a 6 pm.    She plans to start a class that she will weigh in weekly and she feels that she will be kept accountable that way.  This class will last for a year. . benefits of weight loss explored . difficulty of making life-long changes acknowledged . set-back potential acknowledged . weight-related stigma acknowledged  Patient Goals/Self Care Activities: . Patient verbalizes understanding of plan Self-administers medications as prescribed . Calls pharmacy for medication refills . Call's provider office for new concerns or questions . begin a weight loss diary .  get rid of junk food . identify pros and cons of weight loss . identify what might get in the way of success . stock up on healthy food choices . track why I am eating . set weight loss goal        [place], BSN, [place] Phone: [phone] I Fax: [phone]

## 2020-08-19 ENCOUNTER — Ambulatory Visit: Payer: Medicare Other | Admitting: Family Medicine

## 2020-08-24 DIAGNOSIS — M1712 Unilateral primary osteoarthritis, left knee: Secondary | ICD-10-CM | POA: Diagnosis not present

## 2020-08-31 ENCOUNTER — Telehealth: Payer: Medicare Other

## 2020-09-01 DIAGNOSIS — H35361 Drusen (degenerative) of macula, right eye: Secondary | ICD-10-CM | POA: Diagnosis not present

## 2020-09-01 DIAGNOSIS — H2513 Age-related nuclear cataract, bilateral: Secondary | ICD-10-CM | POA: Diagnosis not present

## 2020-09-01 DIAGNOSIS — H04123 Dry eye syndrome of bilateral lacrimal glands: Secondary | ICD-10-CM | POA: Diagnosis not present

## 2020-09-01 DIAGNOSIS — H35362 Drusen (degenerative) of macula, left eye: Secondary | ICD-10-CM | POA: Diagnosis not present

## 2020-09-02 ENCOUNTER — Ambulatory Visit: Payer: Medicare Other

## 2020-09-02 NOTE — Patient Instructions (Signed)
Visit Information  [name]  it was nice speaking with you. Please call me directly [phone] if you have questions about the goals we discussed.  Goals Addressed              This Visit's Progress   .  "I am trying to stay healthy" (pt-stated)        Timeframe:  Long-Range Goal Priority:  High Start Date:    06/09/2019                         Expected End Date:  12/21/20  Patient Goals/Self Care Activities: . Patient verbalizes understanding of plan Self-administers medications as prescribed . Calls pharmacy for medication refills . Call's provider office for new concerns or questions . begin a weight loss diary . get rid of junk food . identify pros and cons of weight loss . identify what might get in the way of success . stock up on healthy food choices . track why I am eating . set weight loss goal/       The patient verbalized understanding of instructions, educational materials, and care plan provided today and declined offer to receive copy of patient instructions, educational materials, and care plan.   Follow up Plan: Patient would like continued follow-up.  CCM RNCM will outreach the patient within the next 60 days per patient request.. Patient will call office if needed prior to next encounter  Lazaro Arms, RN

## 2020-09-02 NOTE — Chronic Care Management (AMB) (Signed)
Care Management    RN Visit Note  09/02/2020 Name: Angie [name] MRN: [id number] DOB: 1945-08-21  Subjective: Angie [name] is a 75 y.o. year old female who is a primary care patient of Angie Feil, MD. The care management team was consulted for assistance with disease management and care coordination needs.    Engaged with patient by telephone for follow up visit in response to provider referral for case management and/or care coordination services.   Consent to Services:   [name] was given information about Care Management services today including:  1. Care Management services includes personalized support from designated clinical staff supervised by her physician, including individualized plan of care and coordination with other care providers 2. 24/7 contact phone numbers for assistance for urgent and routine care needs. 3. The patient may stop case management services at any time by phone call to the office staff.  Patient agreed to services and consent obtained.    Assessment: Patient is making progress with her efforts to lose weight. . See Care Plan below for interventions and patient self-care actives. Follow up Plan: Patient would like continued follow-up.  CCM RNCM will outreach the patient within the next 60 days per patient request.. Patient will call office if needed prior to next encounter  Review of patient past medical history, allergies, medications, health status, including review of consultants reports, laboratory and other test data, was performed as part of comprehensive evaluation and provision of chronic care management services.   SDOH (Social Determinants of Health) assessments and interventions performed:    Care Plan  No Known Allergies  Outpatient Encounter Medications as of 09/02/2020  Medication Sig Note  . Biotin 10000 MCG TABS Take 1 tablet by mouth daily.   . clobetasol cream (TEMOVATE) 5.88 % Apply 1 application topically  2 (two) times daily.   . cycloSPORINE (RESTASIS) 0.05 % ophthalmic emulsion Place 1 drop into both eyes 2 (two) times daily. 06/06/2019: Bid daily   . docusate sodium (COLACE) 100 MG capsule Take 1 capsule (100 mg total) by mouth 2 (two) times daily.   Marland Kitchen esomeprazole (NEXIUM) 20 MG capsule Take 1 capsule (20 mg total) by mouth daily as needed.   . hydrochlorothiazide (HYDRODIURIL) 25 MG tablet Take 1 tablet (25 mg total) by mouth daily.   Marland Kitchen levocetirizine (XYZAL) 5 MG tablet Take 1 tablet (5 mg total) by mouth every evening.   . meclizine (ANTIVERT) 12.5 MG tablet Take 1 tablet (12.5 mg total) by mouth 3 (three) times daily as needed for dizziness. (Patient not taking: No sig reported)   . Multiple Vitamin (MULTIVITAMIN) capsule Take 1 capsule by mouth daily.   . multivitamin-lutein (OCUVITE-LUTEIN) CAPS capsule Take 1 capsule by mouth daily. (Patient not taking: Reported on 07/06/2020)   . OVER THE COUNTER MEDICATION Take 1 tablet by mouth daily. Calcium 500mg , Magnesium 500mg , Potassium 99mg    . OVER THE COUNTER MEDICATION Take 60 mLs by mouth as needed (hip pain). 06/06/2019: Organic tart cherry juice quarter of a cup a day  . pravastatin (PRAVACHOL) 20 MG tablet TAKE ONE TABLET BY MOUTH EVERY OTHER DAY   . pregabalin (LYRICA) 75 MG capsule Take 1 capsule (75 mg total) by mouth 2 (two) times daily.   . SUMAtriptan (IMITREX) 25 MG tablet take one tablet at start of headache. May repeat in 2 hours x 1 if needed   . tiZANidine (ZANAFLEX) 4 MG tablet Take 4 mg by mouth at bedtime as needed for muscle spasms.   Marland Kitchen  traMADol (ULTRAM) 50 MG tablet Take 1-2 tablets (50-100 mg total) by mouth every 6 (six) hours as needed (pain).   . traZODone (DESYREL) 50 MG tablet Take 0.5 tablets (25 mg total) by mouth at bedtime as needed for sleep.   . valACYclovir (VALTREX) 500 MG tablet Take 1 tablet (500 mg total) by mouth daily.    No facility-administered encounter medications on file as of 09/02/2020.     Patient Active Problem List   Diagnosis Date Noted  . H/O colonoscopy 12/29/2019  . BPV (benign positional vertigo) 03/25/2019  . Spondylolisthesis of lumbar region 08/08/2018  . Nasal polyp, benign 03/30/2017  . Pre-diabetes 10/13/2016  . GERD (gastroesophageal reflux disease) 06/22/2015  . Hot flash, menopausal 06/22/2015  . Herpes simplex 04/11/2011  . Hyperlipidemia 02/07/2011  . Carpal tunnel syndrome 09/01/2009  . Lichen sclerosus of female genitalia 05/21/2009  . VARICOSE VEINS, LOWER EXTREMITIES, MILD 04/26/2009  . SYSTOLIC MURMUR 70/35/0093  . Morbid obesity ([place]) 09/20/2006  . HYPERTENSION, BENIGN SYSTEMIC 09/20/2006  . DIVERTICULOSIS OF COLON 09/20/2006    Conditions to be addressed/monitored: Weight Management (Obesity)  Care Plan : Obesity (Adult)  Updates made by Lazaro Arms, RN since 09/02/2020 12:00 AM  Problem: Weight Management (Obesity)   Long-Range Goal: Weight Management   Start Date: 06/10/2020  Expected End Date: 12/21/2020  This Visit's Progress: On track   Current Barriers:  Marland Kitchen Knowledge Deficits related to weight loss strategies as evidenced by weight gain reported/observed and/or altered eating habits . Chronic Disease Management support and education needs related to weight management in patient with HLD  Nurse Case Manager Clinical Goal(s):  Marland Kitchen Over the next 90 days, patient will verbalize basic understanding of plan for initiation of weight loss strategies  IInterventions:  . Evaluation of current treatment plan related to weight loss and patient's adherence to plan as established by provider . Provided verbal and/or written education to patient re: recommended life style changes: avoid fad diets, make small/incremental dietary and exercise changes, eat at the table and avoid eating in front of the TV, plan management of cravings, monitor snacking and cravings in food diary . Discussed plans with patient for ongoing care management follow up  and provided patient with direct contact information for care management team . Reviewed scheduled/upcoming provider appointments including: . barriers to physical activity or exercise addressed- patient states being accountable for the food and exercise . encouragement and support provided . incremental change encouraged-discussed making small changes one at a time . mutual realistic weight loss goal set- 1 to 2 lbs a week . pain assessed and managed- Patient states that she is still having problems exercising at the gym. She has more pain in her body especially her hips.  She will still try to exercise in 30 minute intervals doing different things.  Some days she may walk or do exercises on you tube at home.   She has had 3 virtual with Dr Jenne Campus and trying some of the suggestion that she has given her.    She received a call from [facility] regarding a pre diabetic class that last for a year and they may have a spot for her.  She is very excited. She has 3 lbs. . benefits of weight loss explored . difficulty of making life-long changes acknowledged . set-back potential acknowledged . weight-related stigma acknowledged . Patient states that she would like to move our discussions to every 2 months.  She has several things going on right now but  wishes to stay with the program.  Patient Goals/Self Care Activities: . Patient verbalizes understanding of plan Self-administers medications as prescribed . Calls pharmacy for medication refills . Call's provider office for new concerns or questions . begin a weight loss diary . get rid of junk food . identify pros and cons of weight loss . identify what might get in the way of success . stock up on healthy food choices . track why I am eating . set weight loss goal       [place], BSN, [place] Phone: [phone] I Fax: [phone]

## 2020-09-10 ENCOUNTER — Encounter: Payer: Medicare Other | Attending: Family Medicine | Admitting: Registered"

## 2020-09-10 DIAGNOSIS — R7303 Prediabetes: Secondary | ICD-10-CM | POA: Diagnosis not present

## 2020-09-16 ENCOUNTER — Encounter: Payer: Self-pay | Admitting: Registered"

## 2020-09-16 NOTE — Progress Notes (Signed)
On 09/10/20 patient completed Core Session 1 of Diabetes Prevention Program course virtually with [facility]. The following learning objectives were met by the patient during this class:   Virtual Visit via Video Note  I connected with Hartlyn Matura by a video enabled application and verified that I am speaking with the correct person.  Location: Patient: Home.  Provider: Office.     Learning Objectives:   Be able to explain the purpose and benefits of the National Diabetes Prevention Program.   Be able to describe the events that will take place at every session.   Know the weight loss and physical activity goals established by the [facility] Diabetes Prevention Program.   Know their own individual weight loss and physical activity goals.   Be able to explain the important effect of self-monitoring on behavior change.   Goals:  . Record food and beverage intake in "Food and Activity Tracker" over the next week.  . E-mail completed "Food and Activity Tracker" to Lifestyle Coach next week before session 2. . Circle the foods or beverages you think are highest in fat and calories in your food tracker. . Read the labels on the food you buy, and consider using measuring cups and spoons to help you calculate the amount you eat. We will talk about measuring in more detail in the coming weeks.   Follow-Up Plan:  Attend Core Session 2 next week.   E-mail completed "Food and Activity Tracker" to Lifestyle Coach next week before class.

## 2020-09-17 ENCOUNTER — Encounter: Payer: Medicare Other | Attending: Family Medicine | Admitting: Registered"

## 2020-09-17 DIAGNOSIS — R7303 Prediabetes: Secondary | ICD-10-CM

## 2020-09-20 ENCOUNTER — Encounter: Payer: Self-pay | Admitting: Registered"

## 2020-09-20 NOTE — Progress Notes (Addendum)
On 09/17/20 patient completed Core Session 2 of Diabetes Prevention Program course virtually with [facility]. The following learning objectives were met by the patient during this class:   Virtual Visit via Video Note  I connected with Angie [name] on 09/17/20 at  3:30 PM EST by a video enabled application and verified that I am speaking with the correct person using two identifiers.  Location: Patient: Home.  Provider: Office.   Learning Objectives:  Self-monitor their weight during the weeks following Session 2.   Describe the relationship between fat and calories.   Explain the reason for, and basic principles of, self-monitoring fat grams and calories.   Identify their personal fat gram goals.   Use the ?Fat and Calorie Counter to calculate the calories and fat grams of a given selection of foods.   Keep a running total of the fat grams they eat each day.   Calculate fat, calories, and serving sizes from nutrition labels.   Goals:   Weigh yourself at the same time each day, or every few days, and record your weight in your Food and Activity Tracker.  Write down everything you eat and drink in your Food and Activity Tracker.  Measure portions as much as you can, and start reading labels.   Use the ?Fat and Calorie Counter to figure out the amount of fat and calories in what you ate, and write the amount down in your Food and Activity Tracker.  Keep a running fat gram total throughout the day. Come as close to your fat gram goal as you can.   Follow-Up Plan:  Attend Core Session 3 next week.   Email completed  "Food and Activity Tracker" to Lifestyle Coach next week.

## 2020-09-24 ENCOUNTER — Encounter: Payer: Medicare Other | Attending: Family Medicine | Admitting: Registered"

## 2020-09-24 DIAGNOSIS — R7303 Prediabetes: Secondary | ICD-10-CM

## 2020-09-30 ENCOUNTER — Encounter: Payer: Self-pay | Admitting: Registered"

## 2020-09-30 NOTE — Progress Notes (Signed)
On 09/24/20 patient completed Core Session 3 of Diabetes Prevention Program course virtually with [facility]. The following learning objectives were met by the patient during this class:   Virtual Visit via Video Note  I connected with Angie [name] on 09/24/20 at  3:30 PM EST by a video enabled telemedicine application and verified that I am speaking with the correct person using two identifiers.  Location: Patient: Home.  Provider: Office.   Learning Objectives:  Weigh and measure foods.  Estimate the fat and calorie content of common foods.  Describe three ways to eat less fat and fewer calories.  Create a plan to eat less fat for the following week.   Goals:   Track weight when weighing outside of class.   Track food and beverages eaten each day in Food and Activity Tracker and include fat grams and calories for each.   Try to stay within fat gram goal.   Complete plan for eating less high fat foods and answer related homework questions.    Follow-Up Plan:  Attend Core Session 4 next week.   Bring completed "Food and Activity Tracker" next week to be reviewed by Lifestyle Coach.

## 2020-10-08 ENCOUNTER — Encounter (HOSPITAL_BASED_OUTPATIENT_CLINIC_OR_DEPARTMENT_OTHER): Payer: Medicare Other | Admitting: Registered"

## 2020-10-08 ENCOUNTER — Encounter: Payer: Self-pay | Admitting: Registered"

## 2020-10-08 DIAGNOSIS — R7303 Prediabetes: Secondary | ICD-10-CM

## 2020-10-08 NOTE — Progress Notes (Signed)
On 10/08/20  patient completed Core Session 5 of Diabetes Prevention Program course virtually with [facility]. The following learning objectives were met by the patient during this class:   Virtual Visit via Video Note  I connected with Maezie Reid-White on 10/08/20 at  3:30 PM EDT by a video enabled application and verified that I am speaking with the correct person using two identifiers.  Location: Patient: Home.  Provider: Office.   Learning Objectives:  Establish a physical activity goal.  Explain the importance of the physical activity goal.  Describe their current level of physical activity.  Name ways that they are already physically active.  Develop personal plans for physical activity for the next week.   Goals:   Record weight taken outside of class.   Track foods and beverages eaten each day in the "Food and Activity Tracker," including calories and fat grams for each item.   Make an Activity Plan including date, specific type of activity, and length of time you plan to be active that includes at last 60 minutes of activity for the week.   Track activity type, minutes you were active, and distance you reached each day in the "Food and Activity Tracker."   Follow-Up Plan: . Attend Core Session 6 next week.  . E-mail completed "Food and Activity Tracker" to Lifestyle Coach next week before class

## 2020-10-28 ENCOUNTER — Ambulatory Visit: Payer: Medicare Other

## 2020-10-28 NOTE — Chronic Care Management (AMB) (Signed)
Care Management    RN Visit Note  10/28/2020 Name: Angie [name] MRN: [id number] DOB: 10/13/1945  Subjective: Angie [name] is a 75 y.o. year old female who is a primary care patient of Angie Feil, MD. The care management team was consulted for assistance with disease management and care coordination needs.    Engaged with patient by telephone for follow up visit in response to provider referral for case management and/or care coordination services.   Consent to Services:   Angie [name] was given information about Care Management services today including:  1. Care Management services includes personalized support from designated clinical staff supervised by her physician, including individualized plan of care and coordination with other care providers 2. 24/7 contact phone numbers for assistance for urgent and routine care needs. 3. The patient may stop case management services at any time by phone call to the office staff.  Patient agreed to services and consent obtained.    Assessment: Patient is making progress with with her weight loss and happy with the program that she is in. . See Care Plan below for interventions and patient self-care actives. Follow up Plan: Patient would like continued follow-up.  CCM RNCM  will outreach the patient within the next 60 days.  Patient will call office if needed prior to next encounter  Review of patient past medical history, allergies, medications, health status, including review of consultants reports, laboratory and other test data, was performed as part of comprehensive evaluation and provision of chronic care management services.   SDOH (Social Determinants of Health) assessments and interventions performed:    Care Plan  No Known Allergies  Outpatient Encounter Medications as of 10/28/2020  Medication Sig Note  . Biotin 10000 MCG TABS Take 1 tablet by mouth daily.   . clobetasol cream (TEMOVATE) 7.20 % Apply 1  application topically 2 (two) times daily.   . cycloSPORINE (RESTASIS) 0.05 % ophthalmic emulsion Place 1 drop into both eyes 2 (two) times daily. 06/06/2019: Bid daily   . docusate sodium (COLACE) 100 MG capsule Take 1 capsule (100 mg total) by mouth 2 (two) times daily.   Marland Kitchen esomeprazole (NEXIUM) 20 MG capsule Take 1 capsule (20 mg total) by mouth daily as needed.   . hydrochlorothiazide (HYDRODIURIL) 25 MG tablet Take 1 tablet (25 mg total) by mouth daily.   Marland Kitchen levocetirizine (XYZAL) 5 MG tablet Take 1 tablet (5 mg total) by mouth every evening.   . meclizine (ANTIVERT) 12.5 MG tablet Take 1 tablet (12.5 mg total) by mouth 3 (three) times daily as needed for dizziness. (Patient not taking: No sig reported)   . Multiple Vitamin (MULTIVITAMIN) capsule Take 1 capsule by mouth daily.   . multivitamin-lutein (OCUVITE-LUTEIN) CAPS capsule Take 1 capsule by mouth daily. (Patient not taking: Reported on 07/06/2020)   . OVER THE COUNTER MEDICATION Take 1 tablet by mouth daily. Calcium 500mg , Magnesium 500mg , Potassium 99mg    . OVER THE COUNTER MEDICATION Take 60 mLs by mouth as needed (hip pain). 06/06/2019: Organic tart cherry juice quarter of a cup a day  . pravastatin (PRAVACHOL) 20 MG tablet TAKE ONE TABLET BY MOUTH EVERY OTHER DAY   . pregabalin (LYRICA) 75 MG capsule Take 1 capsule (75 mg total) by mouth 2 (two) times daily.   . SUMAtriptan (IMITREX) 25 MG tablet take one tablet at start of headache. May repeat in 2 hours x 1 if needed   . tiZANidine (ZANAFLEX) 4 MG tablet Take 4 mg by mouth  at bedtime as needed for muscle spasms.   . traMADol (ULTRAM) 50 MG tablet Take 1-2 tablets (50-100 mg total) by mouth every 6 (six) hours as needed (pain).   . traZODone (DESYREL) 50 MG tablet Take 0.5 tablets (25 mg total) by mouth at bedtime as needed for sleep.   . valACYclovir (VALTREX) 500 MG tablet Take 1 tablet (500 mg total) by mouth daily.    No facility-administered encounter medications on file as of  10/28/2020.    Patient Active Problem List   Diagnosis Date Noted  . H/O colonoscopy 12/29/2019  . BPV (benign positional vertigo) 03/25/2019  . Spondylolisthesis of lumbar region 08/08/2018  . Nasal polyp, benign 03/30/2017  . Pre-diabetes 10/13/2016  . GERD (gastroesophageal reflux disease) 06/22/2015  . Hot flash, menopausal 06/22/2015  . Herpes simplex 04/11/2011  . Hyperlipidemia 02/07/2011  . Carpal tunnel syndrome 09/01/2009  . Lichen sclerosus of female genitalia 05/21/2009  . VARICOSE VEINS, LOWER EXTREMITIES, MILD 04/26/2009  . SYSTOLIC MURMUR 53/66/4403  . Morbid obesity ([place]) 09/20/2006  . HYPERTENSION, BENIGN SYSTEMIC 09/20/2006  . DIVERTICULOSIS OF COLON 09/20/2006    Conditions to be addressed/monitored: Obesity  Care Plan : Obesity (Adult)  Updates made by Lazaro Arms, RN since 10/28/2020 12:00 AM  Problem: Weight Management (Obesity)   Long-Range Goal: Weight Management   Start Date: 06/10/2020  Expected End Date: 01/20/2021  Recent Progress: On track  Note:    Current Barriers:  Marland Kitchen Knowledge Deficits related to weight loss strategies as evidenced by weight gain reported/observed and/or altered eating habits . Chronic Disease Management support and education needs related to weight management in patient with HLD  Nurse Case Manager Clinical Goal(s):  Marland Kitchen Over the next 90 days, patient will verbalize basic understanding of plan for initiation of weight loss strategies  IInterventions:  . Evaluation of current treatment plan related to weight loss and patient's adherence to plan as established by provider . Provided verbal and/or written education to patient re: recommended life style changes: avoid fad diets, make small/incremental dietary and exercise changes, eat at the table and avoid eating in front of the TV, plan management of cravings, monitor snacking and cravings in food diary . Discussed plans with patient for ongoing care management follow up and  provided patient with direct contact information for care management team . Reviewed scheduled/upcoming provider appointments including: . barriers to physical activity or exercise addressed- patient states being accountable for the food and exercise . encouragement and support provided . incremental change encouraged-discussed making small changes one at a time . mutual realistic weight loss goal set- 1 to 2 lbs a week . pain assessed and managed- Patient states that she is exercising in the home and walking with a friend when weather is good . She has more pain in her body especially her hips.  She will still try to exercise in 30 minute intervals.   She is working with Darcella Cheshire the nutritionist from [employer].  She is very excited. She has 4 1/2 lbs. In 2 months. Congratulated her on her progress. . benefits of weight loss explored . difficulty of making life-long changes acknowledged . set-back potential acknowledged . weight-related stigma acknowledged . Patient states that she would like to move our discussions to every 2 months.    Patient Goals/Self Care Activities: . Patient verbalizes understanding of plan Self-administers medications as prescribed . Calls pharmacy for medication refills . Call's provider office for new concerns or questions . begin a weight loss  diary . get rid of junk food . identify pros and cons of weight loss . identify what might get in the way of success . stock up on healthy food choices . track why I am eating . set weight loss goal       [place], BSN, [place] Phone: [phone] I Fax: [phone]

## 2020-10-28 NOTE — Patient Instructions (Signed)
Visit Information  Angie [name]  it was nice speaking with you. Please call me directly [phone] if you have questions about the goals we discussed.  Goals Addressed              This Visit's Progress   .  "I am trying to stay healthy" (pt-stated)        Timeframe:  Long-Range Goal Priority:  High Start Date:    06/09/2019                         Expected End Date:  01/20/21  Patient Goals/Self Care Activities: . Patient verbalizes understanding of plan Self-administers medications as prescribed . Calls pharmacy for medication refills . Call's provider office for new concerns or questions . begin a weight loss diary . get rid of junk food . identify pros and cons of weight loss . identify what might get in the way of success . stock up on healthy food choices . track why I am eating . set weight loss goal/       The patient verbalized understanding of instructions, educational materials, and care plan provided today and declined offer to receive copy of patient instructions, educational materials, and care plan.   Follow up Plan: Patient would like continued follow-up.  CCM RNCM will outreach the patient within the next 60 days.  Patient will call office if needed prior to next encounter  Lazaro Arms, RN  [phone]

## 2020-10-29 ENCOUNTER — Encounter: Payer: Medicare Other | Attending: Family Medicine | Admitting: Registered"

## 2020-10-29 DIAGNOSIS — Z713 Dietary counseling and surveillance: Secondary | ICD-10-CM | POA: Diagnosis not present

## 2020-10-29 DIAGNOSIS — R7303 Prediabetes: Secondary | ICD-10-CM | POA: Diagnosis not present

## 2020-11-01 ENCOUNTER — Encounter: Payer: Self-pay | Admitting: Registered"

## 2020-11-01 NOTE — Progress Notes (Signed)
On 10/29/20 patient completed Core Session 8 of Diabetes Prevention Program course virtually with [facility]. The following learning objectives were met by the patient during this class:   Virtual Visit via Video Note  I connected with Aariyana Reid-White by a video enabled application and verified that I am speaking with the correct person using two identifiers.  Location: Patient: Home.  Provider: Office.   Learning Objectives:  Recognize positive and negative food and activity cues.   Change negative food and activity cues to positive cues.   Add positive cues for activity and eliminate cues for inactivity.   Develop a plan for removing one problem food cue for the coming week.   Goals:   Record weight taken outside of class.   Track foods and beverages eaten each day in the "Food and Activity Tracker," including calories and fat grams for each item.    Track activity type, minutes you were active, and distance you reached each day in the "Food and Activity Tracker."   Set aside one 20 to 30-minute block of time every day or find two or more periods of 10 to15 minutes each for physical activity.   Remove one problem food cue.   Add one positive cue for being more active.  Follow-Up Plan: . Attend Core Session 9.  . Email completed "Food and Activity Tracker" next week to be reviewed by Lifestyle Coach.

## 2020-11-04 ENCOUNTER — Telehealth: Payer: Medicare Other | Admitting: Family Medicine

## 2020-11-09 ENCOUNTER — Encounter: Payer: Self-pay | Admitting: Family Medicine

## 2020-11-09 ENCOUNTER — Ambulatory Visit (INDEPENDENT_AMBULATORY_CARE_PROVIDER_SITE_OTHER): Payer: Medicare Other | Admitting: Family Medicine

## 2020-11-09 ENCOUNTER — Other Ambulatory Visit: Payer: Self-pay

## 2020-11-09 VITALS — BP 125/60 | HR 80 | Ht 62.0 in

## 2020-11-09 DIAGNOSIS — L989 Disorder of the skin and subcutaneous tissue, unspecified: Secondary | ICD-10-CM | POA: Insufficient documentation

## 2020-11-09 DIAGNOSIS — R21 Rash and other nonspecific skin eruption: Secondary | ICD-10-CM

## 2020-11-09 MED ORDER — HYDROCORTISONE 2.5 % EX OINT: TOPICAL_OINTMENT | Freq: Two times a day (BID) | CUTANEOUS | 1 refills | Status: DC

## 2020-11-09 NOTE — Progress Notes (Signed)
    SUBJECTIVE:   CHIEF COMPLAINT / HPI:   Rash on leg: Patient is a 75 year old female that presents today to discuss a rash on her leg.  She states this is been going on for about 4 to 5 days.  She states that she typically can only use Dove soaps because she gets allergies to other soaps and breaks out.  She denies wearing any high socks, new detergents, new soaps.  She states that she felt like she had something similar to this a few weeks prior.  She does wear capris on occasion.  She has not worked in a garden or out in the yard lately but may explain her symptoms.  She states her symptoms are distributed on the lower legs and slightly into the upper foot on the dorsal aspect.  She denies any fevers.Angie [name]  PERTINENT  PMH / PSH: History of eczema type reactions to various soaps and detergents  OBJECTIVE:   BP 125/60   Pulse 80   Ht 5\' 2"  (1.575 m)   SpO2 97%   BMI 38.96 kg/m    General: NAD, pleasant, able to participate in exam Cardiac: RRR, no murmurs. Respiratory: No respiratory distress Skin: Mildly erythematous rash present from the mid shin circumferentially around the leg and down to the dorsal aspect of the foot.  The rash is faint in appearance and not raised.  There is somewhat of a petechial appearance to rash particularly on the anterior aspect of the shin but it does blanch.  Patient does have 1+ pitting edema of the bilateral lower extremities. Psych: Normal affect and mood  ASSESSMENT/PLAN:   Rash Assessment: 75 year old female with a few day history of an erythematous, pruritic rash of the bilateral lower extremities which starts at the mid shin and is circumferential around the leg down to the dorsal aspect of the foot.  This rash is faint in appearance.  It does have a almost petechial type appearance but does blanch.  Patient has 1+ pitting edema in the bilateral lower extremities which she states is similar to her baseline.  She has a history of eczema as well as  allergies to soaps and detergents and only uses Dove soap because of this.  She denies any new detergents, soaps, fragrances, socks that may explain her symptoms.  She denies morning socks that cover the distribution of her rash.  Rash does not have any drainage or purulence.  Rash is not painful to palpation.  Overall differential can include a contact allergy versus stasis dermatitis, low concern for cellulitis with no erythema, no warmth, no pain to palpation. Plan:  -Will prescribe 2.5% hydrocortisone ointment to use on the lower extremities -Because of the somewhat petechial appearance we will get a CBC to check patient's platelet count, though I have less of a concern for this as the patient's rash does blanch -Recommended the patient use sunscreen as she is going to the beach for vacation in the next few days. -She will follow-up with [place] if she has any concerns and will let us know if she feels she needs to use the steroid ointment longer than 2 weeks straight.     Lurline Del, [place]    This note was prepared using Dragon voice recognition software and may include unintentional dictation errors due to the inherent limitations of voice recognition software.

## 2020-11-09 NOTE — Patient Instructions (Signed)
It was great to see you! Thank you for allowing me to participate in your care!  I recommend that you always bring your medications to each appointment as this makes it easy to ensure we are on the correct medications and helps [place] not miss when refills are needed.  Our plans for today:  -We are going to check some blood work to make sure your platelets look appropriate. -I am prescribing a steroid ointment to use on your lower legs.  You can use this twice per day.  I recommend that you do not use this for longer than 1-2 weeks straight. -When at the beach I do recommend that you wear sunscreen.  If you develop any worsening symptoms please return.  Take care and seek immediate care sooner if you develop any concerns.   Dr. Lurline Del, [place]

## 2020-11-09 NOTE — Assessment & Plan Note (Signed)
Assessment: 75 year old female with a few day history of an erythematous, pruritic rash of the bilateral lower extremities which starts at the mid shin and is circumferential around the leg down to the dorsal aspect of the foot.  This rash is faint in appearance.  It does have a almost petechial type appearance but does blanch.  Patient has 1+ pitting edema in the bilateral lower extremities which she states is similar to her baseline.  She has a history of eczema as well as allergies to soaps and detergents and only uses Dove soap because of this.  She denies any new detergents, soaps, fragrances, socks that may explain her symptoms.  She denies morning socks that cover the distribution of her rash.  Rash does not have any drainage or purulence.  Rash is not painful to palpation.  Overall differential can include a contact allergy versus stasis dermatitis, low concern for cellulitis with no erythema, no warmth, no pain to palpation. Plan:  -Will prescribe 2.5% hydrocortisone ointment to use on the lower extremities -Because of the somewhat petechial appearance we will get a CBC to check patient's platelet count, though I have less of a concern for this as the patient's rash does blanch -Recommended the patient use sunscreen as she is going to the beach for vacation in the next few days. -She will follow-up with [place] if she has any concerns and will let us know if she feels she needs to use the steroid ointment longer than 2 weeks straight.

## 2020-11-10 LAB — CBC
Hematocrit: 40.8 % (ref 34.0–46.6)
Hemoglobin: 13.6 g/dL (ref 11.1–15.9)
MCH: 30.4 pg (ref 26.6–33.0)
MCHC: 33.3 g/dL (ref 31.5–35.7)
MCV: 91 fL (ref 79–97)
Platelets: 201 10*3/uL (ref 150–450)
RBC: 4.47 x10E6/uL (ref 3.77–5.28)
RDW: 13.7 % (ref 11.7–15.4)
WBC: 7.8 10*3/uL (ref 3.4–10.8)

## 2020-11-19 ENCOUNTER — Encounter: Payer: Medicare Other | Admitting: Registered"

## 2020-12-03 ENCOUNTER — Encounter: Payer: Medicare Other | Attending: Family Medicine

## 2020-12-03 DIAGNOSIS — R7303 Prediabetes: Secondary | ICD-10-CM | POA: Insufficient documentation

## 2020-12-06 ENCOUNTER — Telehealth: Payer: Self-pay | Admitting: *Deleted

## 2020-12-06 NOTE — Chronic Care Management (AMB) (Signed)
  Care Management   Note  12/06/2020 Name: [name] MRN: [id number] DOB: 27-Feb-1946  Angie [name] is a 75 y.o. year old female who is a primary care patient of [name], MD and is actively engaged with the care management team. I reached out to [place] by phone today to assist with re-scheduling a follow up visit with the RN Case Manager.  Follow up plan: Unsuccessful telephone outreach attempt made. A HIPAA compliant phone message was left for the patient providing contact information and requesting a return call.  The care management team will reach out to the patient again over the next 7 days.  If patient returns call to provider office, please advise to call [place] at [phone].  [facility]

## 2020-12-08 DIAGNOSIS — M7661 Achilles tendinitis, right leg: Secondary | ICD-10-CM | POA: Diagnosis not present

## 2020-12-10 ENCOUNTER — Other Ambulatory Visit: Payer: Medicare Other

## 2020-12-10 ENCOUNTER — Other Ambulatory Visit: Payer: Self-pay

## 2020-12-10 ENCOUNTER — Encounter: Payer: Medicare Other | Admitting: Registered"

## 2020-12-13 NOTE — Chronic Care Management (AMB) (Signed)
  Care Management   Note  12/13/2020 Name: Angie [name] MRN: [id number] DOB: May 21, 1946  Angie [name] is a 75 y.o. year old female who is a primary care patient of [name], MD and is actively engaged with the care management team. I reached out to [place] by phone today to assist with re-scheduling a follow up visit with the RN Case Manager.  Follow up plan: Telephone appointment with care management team member scheduled for:01/13/2021  [place] Management

## 2020-12-17 ENCOUNTER — Encounter (HOSPITAL_BASED_OUTPATIENT_CLINIC_OR_DEPARTMENT_OTHER): Payer: Medicare Other | Admitting: Registered"

## 2020-12-17 ENCOUNTER — Encounter: Payer: Self-pay | Admitting: Registered"

## 2020-12-17 DIAGNOSIS — R7303 Prediabetes: Secondary | ICD-10-CM

## 2020-12-17 DIAGNOSIS — Z713 Dietary counseling and surveillance: Secondary | ICD-10-CM

## 2020-12-17 NOTE — Progress Notes (Signed)
On 12/17/20 patient completed the Core Session 13 of Diabetes Prevention Program course virtually with [facility]. By the end of this session patients are able to complete the following objectives:   Virtual Visit via Video Note  I connected with Angie [name] on 12/17/20 at  3:30 PM EDT by a video enabled application and verified that I am speaking with the correct person using two identifiers.  Location: Patient: Home.  Provider: Office.   Learning Objectives:  Describe ways to add interest and variety to their activity plans.  Define ?aerobic fitness.  Explain the four F.I.T.T. principles (frequency, intensity, time, and type of activity) and how they relate to aerobic fitness.   Goals:   Record weight taken outside of class.   Track foods and beverages eaten each day in the "Food and Activity Tracker," including calories and fat grams for each item.    Track activity type, minutes you were active, and distance you reached each day in the "Food and Activity Tracker."   Do your best to reach activity goal for the week.  Use one of the F.I.T.T. principles to jump start workouts.  Document activity level on the "To Do Next Week" handout.  Follow-Up Plan:  Attend Core Session 14 next week.   Email completed "Food and Activity Tracker" before next week to be reviewed by Lifestyle Coach.

## 2020-12-24 ENCOUNTER — Encounter: Payer: Medicare Other | Admitting: Registered"

## 2020-12-28 ENCOUNTER — Telehealth: Payer: Medicare Other

## 2021-01-10 ENCOUNTER — Other Ambulatory Visit: Payer: Self-pay

## 2021-01-10 MED ORDER — PREGABALIN 75 MG PO CAPS: 75.0000 mg | ORAL_CAPSULE | Freq: Two times a day (BID) | ORAL | 1 refills | Status: DC

## 2021-01-13 ENCOUNTER — Telehealth: Payer: Self-pay | Admitting: Licensed Clinical Social Worker

## 2021-01-13 ENCOUNTER — Telehealth: Payer: Medicare Other

## 2021-01-13 NOTE — Chronic Care Management (AMB) (Signed)
    Clinical Social Work  Care Management   Phone Outreach    01/13/2021 Name: Myrian Reid-White MRN: [id number] DOB: [birth year]  Richelle Reid-White is a 75 y.o. year old female who is a primary care patient of [place], Phill Myron, MD .   CCM F/U phone call today for coverage for CCM RN to assess needs, and progress with care plan goals.   Telephone outreach was unsuccessful A HIPPA compliant phone message was left for the patient providing contact information and requesting a return call.   Plan:Will route chart to Care Guide to see if patient would like to reschedule phone appointment   Review of patient status, including review of consultants reports, relevant laboratory and other test results, and collaboration with appropriate care team members and the patient's provider was performed as part of comprehensive patient evaluation and provision of care management services.    Casimer Lanius, [place] / [place]   [phone] 9:15 AM

## 2021-02-04 DIAGNOSIS — M542 Cervicalgia: Secondary | ICD-10-CM | POA: Diagnosis not present

## 2021-02-04 DIAGNOSIS — M545 Low back pain, unspecified: Secondary | ICD-10-CM | POA: Diagnosis not present

## 2021-02-04 DIAGNOSIS — M546 Pain in thoracic spine: Secondary | ICD-10-CM | POA: Diagnosis not present

## 2021-02-09 ENCOUNTER — Telehealth: Payer: Medicare Other

## 2021-02-18 ENCOUNTER — Other Ambulatory Visit: Payer: Self-pay

## 2021-02-18 ENCOUNTER — Ambulatory Visit (INDEPENDENT_AMBULATORY_CARE_PROVIDER_SITE_OTHER): Payer: Medicare Other | Admitting: Family Medicine

## 2021-02-18 ENCOUNTER — Encounter: Payer: Self-pay | Admitting: Family Medicine

## 2021-02-18 VITALS — BP 147/67 | HR 73 | Ht 62.0 in | Wt 210.6 lb

## 2021-02-18 DIAGNOSIS — R7303 Prediabetes: Secondary | ICD-10-CM | POA: Diagnosis not present

## 2021-02-18 DIAGNOSIS — N951 Menopausal and female climacteric states: Secondary | ICD-10-CM | POA: Diagnosis not present

## 2021-02-18 DIAGNOSIS — K219 Gastro-esophageal reflux disease without esophagitis: Secondary | ICD-10-CM | POA: Diagnosis not present

## 2021-02-18 DIAGNOSIS — Z1211 Encounter for screening for malignant neoplasm of colon: Secondary | ICD-10-CM

## 2021-02-18 DIAGNOSIS — E785 Hyperlipidemia, unspecified: Secondary | ICD-10-CM | POA: Diagnosis not present

## 2021-02-18 DIAGNOSIS — Z1231 Encounter for screening mammogram for malignant neoplasm of breast: Secondary | ICD-10-CM

## 2021-02-18 DIAGNOSIS — I10 Essential (primary) hypertension: Secondary | ICD-10-CM | POA: Diagnosis not present

## 2021-02-18 LAB — POCT GLYCOSYLATED HEMOGLOBIN (HGB A1C): HbA1c, POC (controlled diabetic range): 6 % (ref 0.0–7.0)

## 2021-02-18 MED ORDER — VALACYCLOVIR HCL 500 MG PO TABS: 500.0000 mg | ORAL_TABLET | Freq: Every day | ORAL | 1 refills | Status: DC

## 2021-02-18 MED ORDER — HYDROCHLOROTHIAZIDE 25 MG PO TABS: 25.0000 mg | ORAL_TABLET | Freq: Every day | ORAL | 1 refills | Status: DC

## 2021-02-18 MED ORDER — SUMATRIPTAN SUCCINATE 25 MG PO TABS: ORAL_TABLET | ORAL | 1 refills | Status: DC

## 2021-02-18 MED ORDER — PRAVASTATIN SODIUM 20 MG PO TABS: 20.0000 mg | ORAL_TABLET | ORAL | 1 refills | Status: DC

## 2021-02-18 MED ORDER — TETANUS-DIPHTH-ACELL PERTUSSIS 5-2.5-18.5 LF-MCG/0.5 IM SUSP: 0.5000 mL | Freq: Once | INTRAMUSCULAR | 0 refills | Status: AC

## 2021-02-18 MED ORDER — ZOSTER VAC RECOMB ADJUVANTED 50 MCG/0.5ML IM SUSR: 0.5000 mL | Freq: Once | INTRAMUSCULAR | 1 refills | Status: AC

## 2021-02-18 NOTE — Assessment & Plan Note (Signed)
Increased Lyrica to 100 BID. F/U soon for reassessment. COnsider medication holiday in the future. She agreed with the plan.

## 2021-02-18 NOTE — Assessment & Plan Note (Signed)
Off Pravachol x 1 month. Lab defered till her next appointment. Med refilled

## 2021-02-18 NOTE — Patient Instructions (Signed)
Mammogram A mammogram is an X-ray of the breasts. This is done to check for changes that are not normal. This test can look for changes that may be caused by breastcancer or other problems. Mammograms are regularly done on women beginning at age 74. A man may have amammogram if he has a lump or swelling in his breast. Tell a doctor: About any allergies you have. If you have breast implants. If you have had breast disease, biopsy, or surgery. If you have a family history of breast cancer. If you are breastfeeding. Whether you are pregnant or may be pregnant. What are the risks? Generally, this is a safe procedure. But problems may occur, including: Being exposed to radiation. Radiation levels are very low with this test. The need for more tests. The results were not read properly. Trouble finding breast cancer in women with dense breasts. What happens before the test? Have this test done about 1-2 weeks after your menstrual period. This is often when your breasts are the least tender. If you are visiting a new doctor or clinic, have any past mammogram images sent to your new doctor's office. Wash your breasts and under your arms on the day of the test. Do not use deodorants, perfumes, lotions, or powders on the day of the test. Take off any jewelry from your neck. Wear clothes that you can change into and out of easily. What happens during the test?  You will take off your clothes from the waist up. You will put on a gown. You will stand in front of the X-ray machine. Each breast will be placed between two plastic or glass plates. The plates will press down on your breast for a few seconds. Try to relax. This does not cause any harm to your breasts. It may not feel comfortable, but it will be very brief. X-rays will be taken from different angles of each breast. The procedure may vary among doctors and hospitals. What can I expect after the test? The mammogram will be read by a  specialist (radiologist). You may need to do parts of the test again. This depends on the quality of the images. You may go back to your normal activities. It is up to you to get the results of your test. Ask how to get your results when they are ready. Summary A mammogram is an X-ray of the breasts. It looks for changes that may be caused by breast cancer or other problems. A man may have this test if he has a lump or swelling in his breast. Before the test, tell your doctor about any breast problems that you have had in the past. Have this test done about 1-2 weeks after your menstrual period. Ask when your test results will be ready. Make sure you get your test results. This information is not intended to replace advice given to you by your health care provider. Make sure you discuss any questions you have with your healthcare provider. Document Revised: 05/10/2020 Document Reviewed: 05/10/2020 Elsevier Patient Education  2022 [employer].

## 2021-02-18 NOTE — Assessment & Plan Note (Signed)
A1C of 6.0 today. I commended her on diet control. She will continue to work on this.

## 2021-02-18 NOTE — Assessment & Plan Note (Signed)
I discussed avoiding diet triggers and consider prn regimen. She agreed with the plan. GI referral placed as she will be due for colonoscopy in Dec.

## 2021-02-18 NOTE — Assessment & Plan Note (Signed)
Weight reviewed. I discussed exploring other nutritionist services. SHe prefers to hold on referrals for now. She will continue to improve on diet and exercise.

## 2021-02-18 NOTE — Progress Notes (Signed)
   SUBJECTIVE:  CHIEF COMPLAINT / HPI:   GERD: She has been using her Nexium 20 mg daily in the past weeks since she ate pizza which usually flares her symptoms. No other GI symptoms.  HTN/HLD: She is compliant with her HCTZ 25 mg qd. Out of Pravachol x 1 month ago. Need a refill of her meds.  PreDM/Weight management: Here for follow up. Unable to get much exercise due to ankle pain. She was recently diagnosed achillis tendinitis for which she wears ankle brace on her right for support. She stopped attending her session with nutritionist since it was conducted virtually and felt was not helpful to her.  Hot Flashes: Hot flashes worsens, despite compliance with Lyrica 75 mg BID. She wants to try an increased dose.  Smoke hx: She quit 30 years ago.  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  PERTINENT  PMH / PSH: PMX reviewed  OBJECTIVE:  BP (!) 147/67   Pulse 73   Ht '5\' 2"'$  (1.575 m)   Wt 210 lb 9.6 oz (95.5 kg)   SpO2 96%   BMI 38.52 kg/m   Physical Exam Vitals and nursing note reviewed.  Cardiovascular:     Rate and Rhythm: Normal rate and regular rhythm.     Heart sounds: Normal heart sounds. No murmur heard. Pulmonary:     Effort: Pulmonary effort is normal. No respiratory distress.     Breath sounds: Normal breath sounds. No rhonchi.  Abdominal:     General: Bowel sounds are normal. There is no distension.     Palpations: Abdomen is soft. There is no mass.     Tenderness: There is no abdominal tenderness.  Musculoskeletal:     Right lower leg: No edema.     Left lower leg: No edema.     Comments: Right ankle brace in place. Left ankle puffy laterally, with no edema - mostly fatty tissues     ASSESSMENT/PLAN:  HYPERTENSION, BENIGN SYSTEMIC BP optimal. HCTZ refilled. Return in 2-3 months for labs.  Hyperlipidemia Off Pravachol x 1 month. Lab defered till her next appointment. Med refilled  Pre-diabetes A1C of 6.0 today. I commended her on diet  control. She will continue to work on this.  Morbid obesity ([place]) Weight reviewed. I discussed exploring other nutritionist services. SHe prefers to hold on referrals for now. She will continue to improve on diet and exercise.  Hot flash, menopausal Increased Lyrica to 100 BID. F/U soon for reassessment. COnsider medication holiday in the future. She agreed with the plan.  GERD (gastroesophageal reflux disease) I discussed avoiding diet triggers and consider prn regimen. She agreed with the plan. GI referral placed as she will be due for colonoscopy in Dec.   F/U ortho for achillis tendinitis.  No indication for lung cancer screen. Mammogram discussed and was ordered. She will schedule an appointment. Shingrix and Tdap discussed. She will get both her her pharmacy. Med escribed  Andrena Mews, MD [place]

## 2021-02-18 NOTE — Assessment & Plan Note (Signed)
BP optimal. HCTZ refilled. Return in 2-3 months for labs.

## 2021-02-24 ENCOUNTER — Ambulatory Visit: Payer: Medicare Other

## 2021-02-24 NOTE — Chronic Care Management (AMB) (Signed)
Chronic Care Management   CCM RN Visit Note  02/24/2021 Name: Angie [name] MRN: [id number] DOB: 04/08/1946  Subjective: Angie [name] is a 75 y.o. year old female who is a primary care patient of [name], MD. The care management team was consulted for assistance with disease management and care coordination needs.    Engaged with patient by telephone for follow up visit in response to provider referral for case management and/or care coordination services.   Consent to Services:  The patient was given information about Chronic Care Management services, agreed to services, and gave verbal consent prior to initiation of services.  Please see initial visit note for detailed documentation.   Patient agreed to services and verbal consent obtained.    Assessment:  The patient is doing well.  She has lost some weight and start back exercising . See Care Plan below for interventions and patient self-care actives. Follow up Plan: Patient would like continued follow-up.  CCM RNCM will outreach the patient within the next 3 months per her request  Patient will call office if needed prior to next encounter  Review of patient past medical history, allergies, medications, health status, including review of consultants reports, laboratory and other test data, was performed as part of comprehensive evaluation and provision of chronic care management services.   SDOH (Social Determinants of Health) assessments and interventions performed:    CCM Care Plan  No Known Allergies  Outpatient Encounter Medications as of 02/24/2021  Medication Sig Note   Biotin 10000 MCG TABS Take 1 tablet by mouth daily.    clobetasol cream (TEMOVATE) [id number] % Apply 1 application topically 2 (two) times daily. (Patient not taking: Reported on 02/18/2021)    cycloSPORINE (RESTASIS) 0.05 % ophthalmic emulsion Place 1 drop into both eyes 2 (two) times daily. 06/06/2019: Bid daily    docusate sodium (COLACE)  100 MG capsule Take 1 capsule (100 mg total) by mouth 2 (two) times daily. (Patient not taking: Reported on 02/18/2021)    esomeprazole (NEXIUM) 20 MG capsule Take 1 capsule (20 mg total) by mouth daily as needed.    hydrochlorothiazide (HYDRODIURIL) 25 MG tablet Take 1 tablet (25 mg total) by mouth daily.    hydrocortisone 2.5 % ointment Apply topically 2 (two) times daily. As needed for mild eczema.  Do not use for more than 1-2 weeks at a time.    levocetirizine (XYZAL) 5 MG tablet Take 1 tablet (5 mg total) by mouth every evening. (Patient not taking: Reported on 02/18/2021)    meclizine (ANTIVERT) 12.5 MG tablet Take 1 tablet (12.5 mg total) by mouth 3 (three) times daily as needed for dizziness. (Patient not taking: No sig reported)    Multiple Vitamin (MULTIVITAMIN) capsule Take 1 capsule by mouth daily.    multivitamin-lutein (OCUVITE-LUTEIN) CAPS capsule Take 1 capsule by mouth daily.    OVER THE COUNTER MEDICATION Take 1 tablet by mouth daily. Calcium '500mg'$ , Magnesium '500mg'$ , Potassium '99mg'$     pravastatin (PRAVACHOL) 20 MG tablet Take 1 tablet (20 mg total) by mouth every other day.    pregabalin (LYRICA) 75 MG capsule Take 1 capsule (75 mg total) by mouth 2 (two) times daily.    SUMAtriptan (IMITREX) 25 MG tablet take one tablet at start of headache. May repeat in 2 hours x 1 if needed    tiZANidine (ZANAFLEX) 4 MG tablet Take 4 mg by mouth at bedtime as needed for muscle spasms. (Patient not taking: Reported on 02/18/2021)    traMADol (  ULTRAM) 50 MG tablet Take 1-2 tablets (50-100 mg total) by mouth every 6 (six) hours as needed (pain). (Patient not taking: Reported on 02/18/2021)    traZODone (DESYREL) 50 MG tablet Take 0.5 tablets (25 mg total) by mouth at bedtime as needed for sleep. (Patient not taking: Reported on 02/18/2021)    valACYclovir (VALTREX) 500 MG tablet Take 1 tablet (500 mg total) by mouth daily.    No facility-administered encounter medications on file as of 02/24/2021.     Patient Active Problem List   Diagnosis Date Noted   Rash 11/09/2020   H/O colonoscopy 12/29/2019   Spondylolisthesis of lumbar region 08/08/2018   Nasal polyp, benign 03/30/2017   Pre-diabetes 10/13/2016   GERD (gastroesophageal reflux disease) 06/22/2015   Hot flash, menopausal 06/22/2015   Herpes simplex 04/11/2011   Hyperlipidemia 123456   Lichen sclerosus of female genitalia 05/21/2009   VARICOSE VEINS, LOWER EXTREMITIES, MILD [id number]   SYSTOLIC MURMUR [id number]   Morbid obesity ([place]) 09/20/2006   HYPERTENSION, BENIGN SYSTEMIC 09/20/2006   DIVERTICULOSIS OF COLON 09/20/2006    Conditions to be addressed/monitored: Weight Management  Care Plan : Obesity (Adult)  Updates made by Lazaro Arms, RN since 02/24/2021 12:00 AM     Problem: Weight Management (Obesity)      Long-Range Goal: Weight Management   Start Date: 06/10/2020  Expected End Date: 01/20/2021  Recent Progress: On track  Note:    Current Barriers:  Knowledge Deficits related to weight loss strategies as evidenced by weight gain reported/observed and/or altered eating habits Chronic Disease Management support and education needs related to weight management in patient with HLD  Nurse Case Manager Clinical Goal(s):  Over the next 90 days, patient will verbalize basic understanding of plan for initiation of weight loss strategies  Interventions:  Evaluation of current treatment plan related to weight loss and patient's adherence to plan as established by provider Provided verbal and/or written education to patient re: recommended life style changes: avoid fad diets, make small/incremental dietary and exercise changes, eat at the table and avoid eating in front of the TV, plan management of cravings, monitor snacking and cravings in food diary Discussed plans with patient for ongoing care management follow up and provided patient with direct contact information for care management team Reviewed  scheduled/upcoming provider appointments including: barriers to physical activity or exercise addressed- patient states being accountable for the food and exercise encouragement and support provided incremental change encouraged-discussed making small changes one at a time mutual realistic weight loss goal set- 1 to 2 lbs a week pain assessed and managed-  The patient states that she had to stop exercising for a while because she had a swollen Achilles tendon but after wearing a boot for a while it is better and she is back doing some exercises at home on you tube. She has stopped attending her nutrition class because of some issues with connecting with the class online and causing her some anxiety.  But she will join the class from time to time.  She has lost 4 lbs. She has seen Dr. Gwendlyn Deutscher and said she is doing well her BP was good her A1c was at 6.  She was set up to have her mammogram done and she will get her Shingrix and Tdap vaccines at her pharmacy. Advised the patient to continue her efforts by monitoring her diet, drinking water, taking her medications as prescribed, and exercising as tolerated. difficulty of making life-long changes acknowledged set-back potential acknowledged weight-related stigma  acknowledged Patient states that she would like to move our discussions to every 3 months.    Patient Goals/Self Care Activities: Patient verbalizes understanding of plan Self-administers medications as prescribed Calls pharmacy for medication refills Call's provider office for new concerns or questions begin a weight loss diary get rid of junk food identify pros and cons of weight loss identify what might get in the way of success stock up on healthy food choices track why I am eating set weight loss goal       [place], BSN, [place] Phone: [phone] I Fax: [phone]

## 2021-02-24 NOTE — Patient Instructions (Signed)
Visit Information  Angie [name]  it was nice speaking with you. Please call me directly [phone] if you have questions about the goals we discussed.   Goals Addressed               This Visit's Progress     "I am trying to stay healthy" (pt-stated)        Timeframe:  Long-Range Goal Priority:  High Start Date:    06/09/2019                         Expected End Date:    Patient Goals/Self Care Activities: Patient verbalizes understanding of plan Self-administers medications as prescribed Calls pharmacy for medication refills Call's provider office for new concerns or questions begin a weight loss diary get rid of junk food identify pros and cons of weight loss identify what might get in the way of success stock up on healthy food choices track why I am eating set weight loss goal/        The patient verbalized understanding of instructions, educational materials, and care plan provided today and declined offer to receive copy of patient instructions, educational materials, and care plan.   Follow up Plan: Patient would like continued follow-up.  CCM RNCM will outreach the patient within the next 3 months  Patient will call office if needed prior to next encounter  Lazaro Arms, RN  [phone]

## 2021-03-16 ENCOUNTER — Other Ambulatory Visit: Payer: Self-pay | Admitting: Family Medicine

## 2021-03-16 ENCOUNTER — Encounter: Payer: Self-pay | Admitting: Family Medicine

## 2021-03-16 MED ORDER — PREGABALIN 100 MG PO CAPS: 100.0000 mg | ORAL_CAPSULE | Freq: Two times a day (BID) | ORAL | 1 refills | Status: DC

## 2021-03-17 DIAGNOSIS — M170 Bilateral primary osteoarthritis of knee: Secondary | ICD-10-CM | POA: Diagnosis not present

## 2021-03-18 DIAGNOSIS — U071 COVID-19: Secondary | ICD-10-CM | POA: Diagnosis not present

## 2021-04-12 DIAGNOSIS — Z1211 Encounter for screening for malignant neoplasm of colon: Secondary | ICD-10-CM | POA: Diagnosis not present

## 2021-04-12 DIAGNOSIS — K5730 Diverticulosis of large intestine without perforation or abscess without bleeding: Secondary | ICD-10-CM | POA: Diagnosis not present

## 2021-04-12 DIAGNOSIS — K219 Gastro-esophageal reflux disease without esophagitis: Secondary | ICD-10-CM | POA: Diagnosis not present

## 2021-05-03 DIAGNOSIS — L02612 Cutaneous abscess of left foot: Secondary | ICD-10-CM | POA: Diagnosis not present

## 2021-05-03 DIAGNOSIS — L02611 Cutaneous abscess of right foot: Secondary | ICD-10-CM | POA: Diagnosis not present

## 2021-05-04 ENCOUNTER — Ambulatory Visit
Admission: RE | Admit: 2021-05-04 | Discharge: 2021-05-04 | Disposition: A | Discharge status: Home / Self Care | Payer: Medicare Other | Source: Ambulatory Visit | Attending: Family Medicine | Admitting: Family Medicine

## 2021-05-04 ENCOUNTER — Other Ambulatory Visit: Payer: Self-pay

## 2021-05-04 DIAGNOSIS — Z1231 Encounter for screening mammogram for malignant neoplasm of breast: Secondary | ICD-10-CM | POA: Diagnosis not present

## 2021-05-13 DIAGNOSIS — L03032 Cellulitis of left toe: Secondary | ICD-10-CM | POA: Diagnosis not present

## 2021-05-13 DIAGNOSIS — L02611 Cutaneous abscess of right foot: Secondary | ICD-10-CM | POA: Diagnosis not present

## 2021-05-13 DIAGNOSIS — L03031 Cellulitis of right toe: Secondary | ICD-10-CM | POA: Diagnosis not present

## 2021-05-13 DIAGNOSIS — L02612 Cutaneous abscess of left foot: Secondary | ICD-10-CM | POA: Diagnosis not present

## 2021-05-27 ENCOUNTER — Telehealth: Payer: Medicare Other

## 2021-06-03 ENCOUNTER — Ambulatory Visit: Payer: Medicare Other

## 2021-06-03 NOTE — Chronic Care Management (AMB) (Signed)
Chronic Care Management   CCM RN Visit Note  06/03/2021 Name: Angie [name] MRN: [id number] DOB: [birth year]  Subjective: Angie [name] is a 75 y.o. year old female who is a primary care patient of [name], MD. The care management team was consulted for assistance with disease management and care coordination needs.    Engaged with patient by telephone for follow up visit in response to provider referral for case management and/or care coordination services.   Consent to Services:  The patient was given information about Chronic Care Management services, agreed to services, and gave verbal consent prior to initiation of services.  Please see initial visit note for detailed documentation.   Patient agreed to services and verbal consent obtained.    Assessment: Patient is currently experiencing symptoms of  pain in her knees which seems to be exacerbated by exertion.. See Care Plan below for interventions and patient self-care actives. Follow up Plan: Patient would like continued follow-up.  CCM RNCM will outreach the patient within the next 8 weeks.  Patient will call office if needed prior to next encounter  Review of patient past medical history, allergies, medications, health status, including review of consultants reports, laboratory and other test data, was performed as part of comprehensive evaluation and provision of chronic care management services.   SDOH (Social Determinants of Health) assessments and interventions performed:    CCM Care Plan  No Known Allergies  Outpatient Encounter Medications as of 06/03/2021  Medication Sig Note   Biotin 10000 MCG TABS Take 1 tablet by mouth daily.    clobetasol cream (TEMOVATE) 4.40 % Apply 1 application topically 2 (two) times daily. (Patient not taking: Reported on 02/18/2021)    cycloSPORINE (RESTASIS) 0.05 % ophthalmic emulsion Place 1 drop into both eyes 2 (two) times daily. 06/06/2019: Bid daily    docusate  sodium (COLACE) 100 MG capsule Take 1 capsule (100 mg total) by mouth 2 (two) times daily. (Patient not taking: Reported on 02/18/2021)    esomeprazole (NEXIUM) 20 MG capsule Take 1 capsule (20 mg total) by mouth daily as needed.    hydrochlorothiazide (HYDRODIURIL) 25 MG tablet Take 1 tablet (25 mg total) by mouth daily.    hydrocortisone 2.5 % ointment Apply topically 2 (two) times daily. As needed for mild eczema.  Do not use for more than 1-2 weeks at a time.    levocetirizine (XYZAL) 5 MG tablet Take 1 tablet (5 mg total) by mouth every evening. (Patient not taking: Reported on 02/18/2021)    meclizine (ANTIVERT) 12.5 MG tablet Take 1 tablet (12.5 mg total) by mouth 3 (three) times daily as needed for dizziness. (Patient not taking: No sig reported)    Multiple Vitamin (MULTIVITAMIN) capsule Take 1 capsule by mouth daily.    multivitamin-lutein (OCUVITE-LUTEIN) CAPS capsule Take 1 capsule by mouth daily.    OVER THE COUNTER MEDICATION Take 1 tablet by mouth daily. Calcium 500mg , Magnesium 500mg , Potassium 99mg     pravastatin (PRAVACHOL) 20 MG tablet Take 1 tablet (20 mg total) by mouth every other day.    pregabalin (LYRICA) 100 MG capsule Take 1 capsule (100 mg total) by mouth 2 (two) times daily.    SUMAtriptan (IMITREX) 25 MG tablet take one tablet at start of headache. May repeat in 2 hours x 1 if needed    tiZANidine (ZANAFLEX) 4 MG tablet Take 4 mg by mouth at bedtime as needed for muscle spasms. (Patient not taking: Reported on 02/18/2021)    traMADol (ULTRAM) 50  MG tablet Take 1-2 tablets (50-100 mg total) by mouth every 6 (six) hours as needed (pain). (Patient not taking: Reported on 02/18/2021)    traZODone (DESYREL) 50 MG tablet Take 0.5 tablets (25 mg total) by mouth at bedtime as needed for sleep. (Patient not taking: Reported on 02/18/2021)    valACYclovir (VALTREX) 500 MG tablet Take 1 tablet (500 mg total) by mouth daily.    No facility-administered encounter medications on file as  of 06/03/2021.    Patient Active Problem List   Diagnosis Date Noted   Rash 11/09/2020   H/O colonoscopy 12/29/2019   Spondylolisthesis of lumbar region 08/08/2018   Nasal polyp, benign 03/30/2017   Pre-diabetes 10/13/2016   GERD (gastroesophageal reflux disease) 06/22/2015   Hot flash, menopausal 06/22/2015   Herpes simplex 04/11/2011   Hyperlipidemia 78/46/9629   Lichen sclerosus of female genitalia 05/21/2009   VARICOSE VEINS, LOWER EXTREMITIES, MILD 52/84/1324   SYSTOLIC MURMUR 40/04/2724   Morbid obesity ([place]) 09/20/2006   HYPERTENSION, BENIGN SYSTEMIC 09/20/2006   DIVERTICULOSIS OF COLON 09/20/2006    Conditions to be addressed/monitored: Weight loss  Care Plan : Obesity (Adult)    Care Plan : RN Case Manager  Updates made by Lazaro Arms, RN since 06/03/2021 12:00 AM     Problem: Weight Managment (Imbalanced nutrition more than body requirements)      Long-Range Goal: The patient will demonstrate and displaay weight loss maintenance that is optimal for her health.   Start Date: 06/03/2021  Expected End Date: 09/20/2021  Priority: High  Note:   Current Barriers:  Knowledge Deficits related to weight loss strategies as evidenced by weight gain reported/observed and/or altered eating habits Chronic Disease Management support and education needs related to weight management in patient with HLD  RNCM Clinical Goal(s):  Patient will verbalize understanding of plan for management of weightloss as evidenced by reduction of 1+2 pounds per week through collaboration with RN Care manager, provider, and care team. . demonstrate ongoing self health care management ability by identifing negative behaviors that cause weight gain  and over eating as evidenced by change in food intake and exercise as tolerated   Interventions: 1:1 collaboration with primary care provider regarding development and update of comprehensive plan of care as evidenced by provider attestation and  co-signature Inter-disciplinary care team collaboration (see longitudinal plan of care) Evaluation of current treatment plan related to  self management and patient's adherence to plan as established by provider  Weight Loss Interventions:  Advised patient to discuss with primary care provider options regarding weight management;  Collaboration with provider for dietician referral;  Provided verbal and/or written education to patient re: provider recommended life style modifications;  Reviewed recommended dietary changes: avoid fad diets, make small/incremental dietary and exercise changes, eat at the table and avoid eating in front of the TV, plan management of cravings, monitor snacking and cravings in food diary;  06/03/21: [name] states that she has been overwhelmed. She recently went to [place] and walked quite a bit; her knees hurt terribly. She came home and had to get injections in her knees and was told she would need a knee replacement. She is considering having the surgery after the beginning of the year. Because of her knees, she has not been active as she would like. 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  .  Patient Goals/Self Care Activities: -Patient will self administer medications  as prescribed as evidenced by self report/primary caregiver report  -Patient will attend all scheduled provider appointments as evidenced by clinician review of documented attendance to scheduled appointments and patient/caregiver report -Patient will call pharmacy for medication refills as evidenced by patient report and review of pharmacy fill history as appropriate -Patient will call provider office for new concerns or questions as evidenced by review of documented incoming telephone call notes and patient report -Patient verbalizes understanding of plan --Patient will focus on medication adherence by taking medications  as prescribed. - drink 6 to 8 glasses of water each day - join a weight loss program - keep a food diary - manage portion size - set a realistic goal      [place], BSN, [place] Phone: [phone] I Fax: [phone]

## 2021-06-03 NOTE — Patient Instructions (Signed)
Visit Information  Angie [name]  it was nice speaking with you. Please call me directly [phone] if you have questions about the goals we discussed.  Patient Goals/Self Care Activities: -Patient will self administer medications as prescribed as evidenced by self report/primary caregiver report  -Patient will attend all scheduled provider appointments as evidenced by clinician review of documented attendance to scheduled appointments and patient/caregiver report -Patient will call pharmacy for medication refills as evidenced by patient report and review of pharmacy fill history as appropriate -Patient will call provider office for new concerns or questions as evidenced by review of documented incoming telephone call notes and patient report -Patient verbalizes understanding of plan --Patient will focus on medication adherence by taking medications as prescribed. - drink 6 to 8 glasses of water each day - join a weight loss program - keep a food diary - manage portion size - set a realistic goal    The patient verbalized understanding of instructions, educational materials, and care plan provided today and declined offer to receive copy of patient instructions, educational materials, and care plan.   Follow up Plan: Patient would like continued follow-up.  CCM RNCM will outreach the patient within the next 8 weeks.  Patient will call office if needed prior to next encounter  Lazaro Arms, RN  [phone]

## 2021-06-20 ENCOUNTER — Encounter: Payer: Self-pay | Admitting: Family Medicine

## 2021-06-20 NOTE — Telephone Encounter (Signed)
Patient calls nurse line regarding mychart message. Patient is currently asymptomatic.   Advised of CDC recommendations, wearing mask when around others in the home and practicing good hand hygiene.   Patient will reach out to PCP if she becomes symptomatic or tests positive.  Talbot Grumbling, RN

## 2021-06-30 DIAGNOSIS — M1711 Unilateral primary osteoarthritis, right knee: Secondary | ICD-10-CM | POA: Diagnosis not present

## 2021-07-05 ENCOUNTER — Telehealth: Payer: Self-pay

## 2021-07-05 DIAGNOSIS — M25551 Pain in right hip: Secondary | ICD-10-CM | POA: Diagnosis not present

## 2021-07-05 NOTE — Telephone Encounter (Signed)
Patient calls nurse line regarding appointment today at [facility]. Patient states that she is needing a hip replacement. Patient wanted to make PCP aware that  Raliegh Ip faxed over forms for medical clearance. Forms are in provider's box.   Talbot Grumbling, RN

## 2021-07-12 NOTE — Telephone Encounter (Signed)
Spoke with patient. Made appt for 01/4 at 10:50. Salvatore Marvel, CMA

## 2021-07-14 DIAGNOSIS — M1611 Unilateral primary osteoarthritis, right hip: Secondary | ICD-10-CM | POA: Diagnosis not present

## 2021-07-14 DIAGNOSIS — M1711 Unilateral primary osteoarthritis, right knee: Secondary | ICD-10-CM | POA: Diagnosis not present

## 2021-07-14 DIAGNOSIS — M25651 Stiffness of right hip, not elsewhere classified: Secondary | ICD-10-CM | POA: Diagnosis not present

## 2021-07-14 DIAGNOSIS — M1712 Unilateral primary osteoarthritis, left knee: Secondary | ICD-10-CM | POA: Diagnosis not present

## 2021-07-14 DIAGNOSIS — M6281 Muscle weakness (generalized): Secondary | ICD-10-CM | POA: Diagnosis not present

## 2021-07-27 ENCOUNTER — Other Ambulatory Visit: Payer: Self-pay

## 2021-07-27 ENCOUNTER — Ambulatory Visit (INDEPENDENT_AMBULATORY_CARE_PROVIDER_SITE_OTHER): Payer: Medicare Other | Admitting: Family Medicine

## 2021-07-27 ENCOUNTER — Telehealth: Payer: Self-pay | Admitting: Family Medicine

## 2021-07-27 ENCOUNTER — Encounter: Payer: Self-pay | Admitting: Family Medicine

## 2021-07-27 ENCOUNTER — Ambulatory Visit (INDEPENDENT_AMBULATORY_CARE_PROVIDER_SITE_OTHER): Payer: Medicare Other

## 2021-07-27 VITALS — BP 146/62 | HR 84 | Ht 62.0 in | Wt 206.0 lb

## 2021-07-27 DIAGNOSIS — E785 Hyperlipidemia, unspecified: Secondary | ICD-10-CM

## 2021-07-27 DIAGNOSIS — M1711 Unilateral primary osteoarthritis, right knee: Secondary | ICD-10-CM | POA: Diagnosis not present

## 2021-07-27 DIAGNOSIS — M1712 Unilateral primary osteoarthritis, left knee: Secondary | ICD-10-CM | POA: Diagnosis not present

## 2021-07-27 DIAGNOSIS — R7303 Prediabetes: Secondary | ICD-10-CM | POA: Diagnosis not present

## 2021-07-27 DIAGNOSIS — Z23 Encounter for immunization: Secondary | ICD-10-CM

## 2021-07-27 DIAGNOSIS — M1611 Unilateral primary osteoarthritis, right hip: Secondary | ICD-10-CM

## 2021-07-27 DIAGNOSIS — M169 Osteoarthritis of hip, unspecified: Secondary | ICD-10-CM | POA: Insufficient documentation

## 2021-07-27 DIAGNOSIS — Z01810 Encounter for preprocedural cardiovascular examination: Secondary | ICD-10-CM

## 2021-07-27 DIAGNOSIS — I10 Essential (primary) hypertension: Secondary | ICD-10-CM | POA: Diagnosis not present

## 2021-07-27 DIAGNOSIS — M25651 Stiffness of right hip, not elsewhere classified: Secondary | ICD-10-CM | POA: Diagnosis not present

## 2021-07-27 DIAGNOSIS — R7309 Other abnormal glucose: Secondary | ICD-10-CM

## 2021-07-27 DIAGNOSIS — R0600 Dyspnea, unspecified: Secondary | ICD-10-CM

## 2021-07-27 DIAGNOSIS — M6281 Muscle weakness (generalized): Secondary | ICD-10-CM | POA: Diagnosis not present

## 2021-07-27 LAB — POCT GLYCOSYLATED HEMOGLOBIN (HGB A1C): HbA1c, POC (controlled diabetic range): 5.9 % (ref 0.0–7.0)

## 2021-07-27 MED ORDER — TIZANIDINE HCL 4 MG PO TABS: 4.0000 mg | ORAL_TABLET | Freq: Every evening | ORAL | 0 refills | Status: DC | PRN

## 2021-07-27 NOTE — Progress Notes (Addendum)
    SUBJECTIVE:   CHIEF COMPLAINT / HPI:   Right hip DJD: She is scheduled for right hip arthroscopic surgery on 08/22/21 and is here for surgical clearance. She denies any concern today. No chest pain or intermittent SOB on exertion, especially with climbing stairs. She feels well otherwise.  HTN/HLD: Here for follow-up. She is compliant with her HCTZ 25 mg QD and pravachol 20 mg QD.  Weight management: Has not exercised a lot due to pain. She climbs up and down her stairs as tolerated with occasional SOB.  PERTINENT  PMH / PSH: PMHx reviewed.  OBJECTIVE:   BP (!) 146/62   Pulse 84   Ht 5\' 2"  (1.575 m)   Wt 206 lb (93.4 kg)   SpO2 99%   BMI 37.68 kg/m   Physical Exam Vitals reviewed.  Cardiovascular:     Rate and Rhythm: Normal rate and regular rhythm.     Heart sounds: Normal heart sounds. No murmur heard. Pulmonary:     Effort: Pulmonary effort is normal. No respiratory distress.     Breath sounds: Normal breath sounds. No wheezing.  Abdominal:     General: Abdomen is flat. Bowel sounds are normal. There is no distension.     Palpations: Abdomen is soft. There is no mass.     Tenderness: There is no abdominal tenderness.  Musculoskeletal:     Cervical back: Neck supple.     Right lower leg: No edema.     Left lower leg: No edema.     Comments: Antalgic gait due to hip pain  Neurological:     Mental Status: She is oriented to person, place, and time.     ASSESSMENT/PLAN:   Degenerative joint disease (DJD) of hip She is scheduled for a total hip arthroplasty on 08/22/21 MACE score = low risk METs >4 EKG shows new Twave inversion on Leads I, II and AVL. No chest pain. Recent SOB with stair climbing or exertion. CBC, Bmet checked today. A1C looks good. Given new SOB and EKG changes, as discussed with her, I feel comfortable with her receiving cardiology clearance as well. ECHO ordered and scheduled. Cards referral placed.  HYPERTENSION, BENIGN SYSTEMIC BP  looks good for her age. No meds adjustment. Bmet checked.  Hyperlipidemia FLP checked.  Morbid obesity ([place]) She lost a few pounds since last visit intentionally. She will continue to work on this and exercise as tolerated.    Flu and covid shots given today.  Andrena Mews, MD [place]

## 2021-07-27 NOTE — Patient Instructions (Signed)
It was nice seeing you today. So far your work up looks great. I will contact you soon with other results. Thanks.

## 2021-07-27 NOTE — Assessment & Plan Note (Signed)
She lost a few pounds since last visit intentionally. She will continue to work on this and exercise as tolerated.

## 2021-07-27 NOTE — Assessment & Plan Note (Signed)
She is scheduled for a total hip arthroplasty on 08/22/21 MACE score = low risk METs < 4 EKG shows new Twave inversion on Leads I, II and AVL. No chest pain. Recent SOB with stair climbing or exertion. CBC, Bmet checked today. A1C looks good. Given new SOB and EKG changes, as discussed with her, I feel comfortable with her receiving cardiology clearance as well. ECHO ordered and scheduled. Cards referral placed.

## 2021-07-27 NOTE — Assessment & Plan Note (Signed)
FLP checked. 

## 2021-07-27 NOTE — Assessment & Plan Note (Signed)
BP looks good for her age. No meds adjustment. Bmet checked.

## 2021-07-27 NOTE — Telephone Encounter (Signed)
Given new EKG changes and recent SOB, cardiology eval preferred prior to surgery. I called and inform her of the referral. She agreed with the plan.

## 2021-07-28 ENCOUNTER — Telehealth: Payer: Self-pay | Admitting: Family Medicine

## 2021-07-28 ENCOUNTER — Encounter: Payer: Self-pay | Admitting: Family Medicine

## 2021-07-28 LAB — BASIC METABOLIC PANEL
BUN/Creatinine Ratio: 27 (ref 12–28)
BUN: 20 mg/dL (ref 8–27)
CO2: 29 mmol/L (ref 20–29)
Calcium: 10.1 mg/dL (ref 8.7–10.3)
Chloride: 102 mmol/L (ref 96–106)
Creatinine, Ser: 0.73 mg/dL (ref 0.57–1.00)
Glucose: 106 mg/dL — ABNORMAL HIGH (ref 70–99)
Potassium: 4 mmol/L (ref 3.5–5.2)
Sodium: 142 mmol/L (ref 134–144)
eGFR: 86 mL/min/{1.73_m2} (ref >59)

## 2021-07-28 LAB — CBC WITH DIFFERENTIAL/PLATELET
Basophils Absolute: 0.1 10*3/uL (ref 0.0–0.2)
Basos: 1 %
EOS (ABSOLUTE): 0.1 10*3/uL (ref 0.0–0.4)
Eos: 1 %
Hematocrit: 41.2 % (ref 34.0–46.6)
Hemoglobin: 13.5 g/dL (ref 11.1–15.9)
Immature Grans (Abs): 0 10*3/uL (ref 0.0–0.1)
Immature Granulocytes: 0 %
Lymphocytes Absolute: 2.2 10*3/uL (ref 0.7–3.1)
Lymphs: 29 %
MCH: 30.3 pg (ref 26.6–33.0)
MCHC: 32.8 g/dL (ref 31.5–35.7)
MCV: 93 fL (ref 79–97)
Monocytes Absolute: 0.5 10*3/uL (ref 0.1–0.9)
Monocytes: 7 %
Neutrophils Absolute: 4.7 10*3/uL (ref 1.4–7.0)
Neutrophils: 62 %
Platelets: 223 10*3/uL (ref 150–450)
RBC: 4.45 x10E6/uL (ref 3.77–5.28)
RDW: 13.2 % (ref 11.7–15.4)
WBC: 7.5 10*3/uL (ref 3.4–10.8)

## 2021-07-28 LAB — LIPID PANEL
Chol/HDL Ratio: 3.4 ratio (ref 0.0–4.4)
Cholesterol, Total: 197 mg/dL (ref 100–199)
HDL: 58 mg/dL (ref >39)
LDL Chol Calc (NIH): 113 mg/dL — ABNORMAL HIGH (ref 0–99)
Triglycerides: 146 mg/dL (ref 0–149)
VLDL Cholesterol Cal: 26 mg/dL (ref 5–40)

## 2021-07-28 MED ORDER — PRAVASTATIN SODIUM 40 MG PO TABS: 40.0000 mg | ORAL_TABLET | ORAL | 1 refills | Status: DC

## 2021-07-28 NOTE — Telephone Encounter (Signed)
Lab results discussed.   LDL improved, but her ASCVD risk places her at a high intermediate risk for cardiovascular disease at 17.6%.   Moderate-intensity statin is recommended. She is currently on low-intensity Pravachol 20 mg QD.  I discussed going up on this vs. switching to Crestor. She opted to increase her Pravachol to 40 mg QD.   Med escribed.   She is medically cleared for surgery pending cardiology clearance. I will complete her form and fax it to her surgeon.   She will f/u with Cards tomorrow as scheduled.

## 2021-07-29 ENCOUNTER — Encounter: Payer: Self-pay | Admitting: Student

## 2021-07-29 ENCOUNTER — Other Ambulatory Visit: Payer: Self-pay | Admitting: Family Medicine

## 2021-07-29 ENCOUNTER — Other Ambulatory Visit: Payer: Self-pay

## 2021-07-29 ENCOUNTER — Ambulatory Visit: Payer: Medicare Other | Admitting: Student

## 2021-07-29 VITALS — BP 136/81 | HR 87 | Temp 98.1°F | Ht 62.0 in | Wt 206.0 lb

## 2021-07-29 DIAGNOSIS — Z01818 Encounter for other preprocedural examination: Secondary | ICD-10-CM | POA: Diagnosis not present

## 2021-07-29 DIAGNOSIS — Z01810 Encounter for preprocedural cardiovascular examination: Secondary | ICD-10-CM

## 2021-07-29 DIAGNOSIS — R9431 Abnormal electrocardiogram [ECG] [EKG]: Secondary | ICD-10-CM | POA: Diagnosis not present

## 2021-07-29 DIAGNOSIS — R0609 Other forms of dyspnea: Secondary | ICD-10-CM | POA: Diagnosis not present

## 2021-07-29 NOTE — Progress Notes (Signed)
Primary Physician/Referring:  [name], MD  Patient ID: Angie [name], female    DOB: 02-Mar-1946, 76 y.o.   MRN: [id number]  Chief Complaint  Patient presents with   Pre-op Exam    January 30,2023 right hip   New Patient (Initial Visit)    Referred by Dr. Zachery Dakins   HPI:    Angie [name]  is a 76 y.o. AA female with hypertension, hyperlipidemia, prediabetes, 15 year pack history (quit 1990), family history of premature CAD (sister with MI in 39s).  Patient also has history of arthritis and vertigo.  She is referred to our office for preoperative risk stratification as she is currently scheduled for right total hip arthroplasty at the end of this month.  In 01/2021 patient was exercising approximately 3 days/week without issue.  However she then went on a trip to [place] where she "popped it came back with significant arthritis pain.  Since then her physical activity has been limited.  She has been working to lose weight over the last several months as well.  Upon further questioning patient states she has been experiencing dyspnea on exertion over the last 3 months, particularly when walking up the stairs in her home.  Denies chest pain, palpitations, syncope, near syncope.  She is currently scheduled for echocardiogram as ordered by PCP prior to upcoming orthopedic surgery.  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  Never received treatment.  No signs of active disease and low risk for reactivation.  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  Patient  declines referral to health department to discuss at this time.    Past Surgical History:  Procedure Laterality Date   CARPAL TUNNEL RELEASE Bilateral 2012   CHOLECYSTECTOMY  1982   MAXIMUM ACCESS (MAS)POSTERIOR LUMBAR INTERBODY FUSION (PLIF) 2 LEVEL N/A 08/08/2018   Procedure: Decompression and  Fusion of Lumbar Four-Five Lumbar Five-Sacral One, Pedicle Screws of Lumbar Four- Lumbar Five, Lumbar Five- Sacral One, Lumbar Four-Lumbar Five Interbodies;  Surgeon: Erline Levine, MD;  Location: [facility];  Service: Neurosurgery;  Laterality: N/A;  Decompression and  Fusion of Lumbar Four-Five Lumbar Five-Sacral One, Pedicle Screws of Lumbar Four- Lumbar Five, Lumbar Fiv   ROTATOR CUFF REPAIR Bilateral 2009   TUBAL LIGATION     Family History  Problem Relation Age of Onset   Diabetes Mother    Stroke Mother    Hypertension Mother    Diabetes Father    Kidney disease Father    Heart attack Father    Diabetes Sister    Heart disease Sister    Heart attack Sister    Breast cancer Sister        36s   Diabetes Sister    Gout Sister    Alcohol abuse Brother     Social History   Tobacco Use   Smoking status: Former    Packs/day: 0.50    Years: 30.00    Pack years: 15.00    Types: Cigarettes    Quit  date: 07/24/1993    Years since quitting: 28.0   Smokeless tobacco: Never  Substance Use Topics   Alcohol use: No   Marital Status: Married   ROS  Review of Systems  Constitutional: Negative for malaise/fatigue and weight gain.  Cardiovascular:  Positive for dyspnea on exertion. Negative for chest pain, claudication, leg swelling, near-syncope, orthopnea, palpitations, paroxysmal nocturnal dyspnea and syncope.  Neurological:  Negative for dizziness.   Objective  Blood pressure 136/81, pulse 87, temperature 98.1 F (36.7 C), temperature source Temporal, height 5\' 2"  (1.575 m), weight 206 lb (93.4 kg), SpO2 97 %.  Vitals with BMI 07/29/2021 07/27/2021 02/18/2021  Height 5\' 2"  5\' 2"  5\' 2"    Weight 206 lbs 206 lbs 210 lbs 10 oz  BMI 37.67 24.26 83.41  Systolic 962 229 798  Diastolic 81 62 67  Pulse 87 84 73      Physical Exam Vitals reviewed.  Constitutional:      Appearance: She is obese.  HENT:     Head: Normocephalic and atraumatic.  Cardiovascular:     Rate and Rhythm: Normal rate and regular rhythm.     Pulses: Intact distal pulses.     Heart sounds: S1 normal and S2 normal. No murmur heard.   No gallop.  Pulmonary:     Effort: Pulmonary effort is normal. No respiratory distress.     Breath sounds: No wheezing, rhonchi or rales.  Musculoskeletal:     Right lower leg: No edema.     Left lower leg: No edema.  Neurological:     Mental Status: She is alert.    Laboratory examination:   Recent Labs    07/27/21 1120  NA 142  K 4.0  CL 102  CO2 29  GLUCOSE 106*  BUN 20  CREATININE 0.73  CALCIUM 10.1   estimated creatinine clearance is 65.6 mL/min (by C-G formula based on SCr of 0.73 mg/dL).  CMP Latest Ref Rng & Units 07/27/2021 01/07/2020 08/02/2018  Glucose 70 - 99 mg/dL 106(H) 122(H) 130(H)  BUN 8 - 27 mg/dL 20 16 20   Creatinine 0.57 - 1.00 mg/dL 0.73 0.72 0.69  Sodium 134 - 144 mmol/L 142 142 140  Potassium 3.5 - 5.2 mmol/L 4.0 3.9 3.7  Chloride 96 - 106 mmol/L 102 102 102  CO2 20 - 29 mmol/L 29 25 29   Calcium 8.7 - 10.3 mg/dL 10.1 9.7 10.1  Total Protein 6.0 - 8.5 g/dL - 6.6 -  Total Bilirubin 0.0 - 1.2 mg/dL - 0.7 -  Alkaline Phos 48 - 121 IU/L - 82 -  AST 0 - 40 IU/L - 22 -  ALT 0 - 32 IU/L - 20 -   CBC Latest Ref Rng & Units 07/27/2021 11/09/2020 08/02/2018  WBC 3.4 - 10.8 x10E3/uL 7.5 7.8 10.4  Hemoglobin 11.1 - 15.9 g/dL 13.5 13.6 13.5  Hematocrit 34.0 - 46.6 % 41.2 40.8 41.5  Platelets 150 - 450 x10E3/uL 223 201 241    Lipid Panel Recent Labs    07/27/21 1120  CHOL 197  TRIG 146  LDLCALC 113*  HDL 58  CHOLHDL 3.4    HEMOGLOBIN A1C Lab Results  Component Value Date   HGBA1C 5.9 07/27/2021   TSH No results for input(s):  TSH in the last 8760 hours.  External labs:   None   Allergies  No Known Allergies   Medications Prior to Visit:   Outpatient Medications Prior to Visit  Medication Sig Dispense Refill   clobetasol cream (TEMOVATE) 0.05 %  Apply 1 application topically 2 (two) times daily. 60 g 1   cycloSPORINE (RESTASIS) 0.05 % ophthalmic emulsion Place 1 drop into both eyes 2 (two) times daily.     diclofenac Sodium (VOLTAREN) 1 % GEL Apply topically 4 (four) times daily.     docusate sodium (COLACE) 100 MG capsule Take 1 capsule (100 mg total) by mouth 2 (two) times daily. 10 capsule 0   hydrochlorothiazide (HYDRODIURIL) 25 MG tablet Take 1 tablet (25 mg total) by mouth daily. 90 tablet 1   melatonin 3 MG TABS tablet Take 3 mg by mouth at bedtime.     Multiple Vitamin (MULTIVITAMIN) capsule Take 1 capsule by mouth daily.     multivitamin-lutein (OCUVITE-LUTEIN) CAPS capsule Take 1 capsule by mouth daily.     NON FORMULARY Take 1 tablet by mouth in the morning and at bedtime. cognium     NON FORMULARY Take 1 tablet by mouth daily. Calcium magnesium potassium     OVER THE COUNTER MEDICATION Take 1 tablet by mouth daily. Calcium 500mg , Magnesium 500mg , Potassium 99mg      pravastatin (PRAVACHOL) 40 MG tablet Take 1 tablet (40 mg total) by mouth every other day. 90 tablet 1   pregabalin (LYRICA) 100 MG capsule Take 1 capsule (100 mg total) by mouth 2 (two) times daily. 180 capsule 1   SUMAtriptan (IMITREX) 25 MG tablet Take 25 mg by mouth every 2 (two) hours as needed for migraine. May repeat in 2 hours if headache persists or recurs.     tiZANidine (ZANAFLEX) 4 MG tablet Take 1 tablet (4 mg total) by mouth at bedtime as needed for muscle spasms. 30 tablet 0   traMADol (ULTRAM) 50 MG tablet Take 1-2 tablets (50-100 mg total) by mouth every 6 (six) hours as needed (pain). 30 tablet 0   valACYclovir (VALTREX) 500 MG tablet Take 1 tablet (500 mg total) by mouth daily. 90 tablet 1   Sod Picosulfate-Mag Ox-Cit  Acd (CLENPIQ) 10-3.5-12 MG-GM -GM/160ML SOLN      Biotin 10000 MCG TABS Take 1 tablet by mouth daily. (Patient not taking: Reported on 07/27/2021)     levocetirizine (XYZAL) 5 MG tablet Take 1 tablet (5 mg total) by mouth every evening. (Patient not taking: Reported on 02/18/2021) 90 tablet 3   meclizine (ANTIVERT) 12.5 MG tablet Take 1 tablet (12.5 mg total) by mouth 3 (three) times daily as needed for dizziness. (Patient not taking: No sig reported) 15 tablet 0   SUMAtriptan (IMITREX) 25 MG tablet take one tablet at start of headache. May repeat in 2 hours x 1 if needed (Patient not taking: Reported on 07/27/2021) 15 tablet 1   No facility-administered medications prior to visit.   Final Medications at End of Visit    Current Meds  Medication Sig   clobetasol cream (TEMOVATE) 2.95 % Apply 1 application topically 2 (two) times daily.   cycloSPORINE (RESTASIS) 0.05 % ophthalmic emulsion Place 1 drop into both eyes 2 (two) times daily.   diclofenac Sodium (VOLTAREN) 1 % GEL Apply topically 4 (four) times daily.   docusate sodium (COLACE) 100 MG capsule Take 1 capsule (100 mg total) by mouth 2 (two) times daily.   hydrochlorothiazide (HYDRODIURIL) 25 MG tablet Take 1 tablet (25 mg total) by mouth daily.   melatonin 3 MG TABS tablet Take 3 mg by mouth at bedtime.   Multiple Vitamin (MULTIVITAMIN) capsule Take 1 capsule by mouth daily.   multivitamin-lutein (OCUVITE-LUTEIN) CAPS capsule Take 1 capsule by mouth daily.  NON FORMULARY Take 1 tablet by mouth in the morning and at bedtime. cognium   NON FORMULARY Take 1 tablet by mouth daily. Calcium magnesium potassium   OVER THE COUNTER MEDICATION Take 1 tablet by mouth daily. Calcium 500mg , Magnesium 500mg , Potassium 99mg    pravastatin (PRAVACHOL) 40 MG tablet Take 1 tablet (40 mg total) by mouth every other day.   pregabalin (LYRICA) 100 MG capsule Take 1 capsule (100 mg total) by mouth 2 (two) times daily.   SUMAtriptan (IMITREX) 25 MG tablet Take  25 mg by mouth every 2 (two) hours as needed for migraine. May repeat in 2 hours if headache persists or recurs.   tiZANidine (ZANAFLEX) 4 MG tablet Take 1 tablet (4 mg total) by mouth at bedtime as needed for muscle spasms.   traMADol (ULTRAM) 50 MG tablet Take 1-2 tablets (50-100 mg total) by mouth every 6 (six) hours as needed (pain).   valACYclovir (VALTREX) 500 MG tablet Take 1 tablet (500 mg total) by mouth daily.   [DISCONTINUED] Sod Picosulfate-Mag Ox-Cit Acd (CLENPIQ) 10-3.5-12 MG-GM -GM/160ML SOLN    Radiology:   No results found.  Cardiac Studies:   Echocardiogram 08/20/2013: Normal LV size with mild LV hypertrophy. EF 60-65%. Normal   RV size and systolic function. No significant valvular abnormalities.   EKG:   07/29/2021: Sinus rhythm at a rate of 85 bpm.  Normal axis.  Left atrial enlargement.  Poor R wave progression, cannot exclude anteroseptal infarct old.  Nonspecific ST-T wave abnormalities, unchanged compared to previous in 2020.   Assessment     ICD-10-CM   1. Pre-op exam  Z01.818 EKG 12-Lead    2. Dyspnea on exertion  R06.09 PCV MYOCARDIAL PERFUSION WITH LEXISCAN    3. Nonspecific abnormal electrocardiogram (ECG) (EKG)  R94.31 PCV MYOCARDIAL PERFUSION WITH LEXISCAN    4. Preoperative cardiovascular examination  Z01.810 PCV MYOCARDIAL PERFUSION WITH LEXISCAN       Medications Discontinued During This Encounter  Medication Reason   Biotin 10000 MCG TABS    levocetirizine (XYZAL) 5 MG tablet    meclizine (ANTIVERT) 12.5 MG tablet    Sod Picosulfate-Mag Ox-Cit Acd (CLENPIQ) 10-3.5-12 MG-GM -GM/160ML SOLN    SUMAtriptan (IMITREX) 25 MG tablet     No orders of the defined types were placed in this encounter.   Recommendations:   Angie [name] is a 76 y.o. AA female with hypertension, hyperlipidemia, prediabetes, 15 year pack history (quit 1990), family history of premature CAD (sister with MI in 61s).  Patient also has history of arthritis and  vertigo.  She is referred to our office for preoperative risk stratification as she is currently scheduled for right total hip arthroplasty at the end of this month.  Patient's blood pressure is well controlled and PCP manages hyperlipidemia.  Given patient's dyspnea on exertion and multiple cardiovascular risk factors shared decision was to proceed with further ischemic evaluation prior to upcoming surgery in order to better risk stratify her.  Given significant arthritic pain as well as dyspnea on exertion patient is not a treadmill candidate, will therefore obtain Lexiscan nuclear stress test.  Agree with patient's PCP in obtaining echocardiogram.  If echocardiogram and stress test are without significant abnormalities, patient may proceed with low risk from a cardiac standpoint for upcoming orthopedic surgery.  Follow-up in 4 weeks, sooner if needed, for results of cardiac testing.   Alethia Berthold, PA-C 07/29/2021, 12:40 PM Office: [phone]

## 2021-08-01 ENCOUNTER — Ambulatory Visit (HOSPITAL_COMMUNITY): Payer: Medicare Other | Attending: Cardiovascular Disease

## 2021-08-01 ENCOUNTER — Ambulatory Visit: Payer: Medicare Other

## 2021-08-01 ENCOUNTER — Other Ambulatory Visit: Payer: Self-pay

## 2021-08-01 DIAGNOSIS — Z01810 Encounter for preprocedural cardiovascular examination: Secondary | ICD-10-CM

## 2021-08-01 DIAGNOSIS — R0609 Other forms of dyspnea: Secondary | ICD-10-CM | POA: Diagnosis not present

## 2021-08-01 DIAGNOSIS — R0600 Dyspnea, unspecified: Secondary | ICD-10-CM

## 2021-08-01 DIAGNOSIS — I10 Essential (primary) hypertension: Secondary | ICD-10-CM

## 2021-08-01 DIAGNOSIS — R9431 Abnormal electrocardiogram [ECG] [EKG]: Secondary | ICD-10-CM

## 2021-08-01 LAB — ECHOCARDIOGRAM COMPLETE
Area-P 1/2: 4.12 cm2
S' Lateral: 2.2 cm

## 2021-08-01 NOTE — Progress Notes (Signed)
Sent message, via epic in basket, requesting orders in epic from surgeon.  

## 2021-08-02 DIAGNOSIS — M1712 Unilateral primary osteoarthritis, left knee: Secondary | ICD-10-CM | POA: Diagnosis not present

## 2021-08-02 DIAGNOSIS — M25651 Stiffness of right hip, not elsewhere classified: Secondary | ICD-10-CM | POA: Diagnosis not present

## 2021-08-02 DIAGNOSIS — M1611 Unilateral primary osteoarthritis, right hip: Secondary | ICD-10-CM | POA: Diagnosis not present

## 2021-08-02 DIAGNOSIS — M6281 Muscle weakness (generalized): Secondary | ICD-10-CM | POA: Diagnosis not present

## 2021-08-02 DIAGNOSIS — M1711 Unilateral primary osteoarthritis, right knee: Secondary | ICD-10-CM | POA: Diagnosis not present

## 2021-08-03 ENCOUNTER — Encounter: Payer: Self-pay | Admitting: Student

## 2021-08-03 DIAGNOSIS — M1611 Unilateral primary osteoarthritis, right hip: Secondary | ICD-10-CM | POA: Diagnosis not present

## 2021-08-03 NOTE — Progress Notes (Signed)
Called and spoke with patient regarding her stress test results.

## 2021-08-03 NOTE — H&P (Signed)
HIP ARTHROPLASTY ADMISSION H&P  Patient ID: Angie [name] MRN: [id number] DOB/AGE: February 21, 1946 76 y.o.  Chief Complaint: right hip pain.  Planned Procedure Date: 08-22-21 Medical Clearance by Dr. Gwendlyn Deutscher   Cardiac Clearance by Lawerance Cruel PA-C   HPI: Angie [name] is a 76 y.o. female who presents for evaluation of OA RIGHT HIP. The patient has a history of pain and functional disability in the right hip due to arthritis and has failed non-surgical conservative treatments for greater than 12 weeks to include NSAID's and/or analgesics, corticosteriod injections, supervised PT with diminished ADL's post treatment, weight reduction as appropriate, and activity modification.  Onset of symptoms was gradual, starting 2 years ago with gradually worsening course since that time. The patient noted no past surgery on the right hip.  Patient currently rates pain at 7 out of 10 with activity. Patient has night pain, worsening of pain with activity and weight bearing, and pain that interferes with activities of daily living.  Patient has evidence of subchondral sclerosis, periarticular osteophytes, and joint space narrowing by imaging studies.  There is no active infection.  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  Never received treatment.  No signs of active disease and low risk for reactivation.  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  Patient declines referral to health department to discuss at this time.    Past Surgical History:  Procedure Laterality Date   CARPAL TUNNEL  RELEASE Bilateral 2012   CHOLECYSTECTOMY  1982   MAXIMUM ACCESS (MAS)POSTERIOR LUMBAR INTERBODY FUSION (PLIF) 2 LEVEL N/A 08/08/2018   Procedure: Decompression and  Fusion of Lumbar Four-Five Lumbar Five-Sacral One, Pedicle Screws of Lumbar Four- Lumbar Five, Lumbar Five- Sacral One, Lumbar Four-Lumbar Five Interbodies;  Surgeon: Erline Levine, MD;  Location: [facility];  Service: Neurosurgery;  Laterality: N/A;  Decompression and  Fusion of Lumbar Four-Five Lumbar Five-Sacral One, Pedicle Screws of Lumbar Four- Lumbar Five, Lumbar Fiv   ROTATOR CUFF REPAIR Bilateral 2009   TUBAL LIGATION     No Known Allergies Prior to Admission medications   Medication Sig Start Date End Date Taking? Authorizing Provider  clobetasol cream (TEMOVATE) 4.09 % Apply 1 application topically 2 (two) times daily. 09/24/19   [name], MD  cycloSPORINE (RESTASIS) 0.05 % ophthalmic emulsion Place 1 drop into both eyes 2 (two) times daily.    [provider]  diclofenac Sodium (VOLTAREN) 1 % GEL Apply topically 4 (four) times daily.    [provider]  docusate sodium (COLACE) 100 MG capsule Take 1 capsule (100 mg total) by mouth 2 (two) times daily. 08/10/18   Judith Part, MD  famotidine (PEPCID) 40 MG tablet Take 40 mg by mouth 2 (two) times daily.    [provider]  hydrochlorothiazide (HYDRODIURIL) 25 MG tablet Take 1 tablet (25 mg total) by mouth daily. 02/18/21   [name], MD  melatonin 3 MG TABS tablet Take 3 mg by mouth at bedtime.    [provider]  Multiple  Vitamin (MULTIVITAMIN) capsule Take 1 capsule by mouth daily.    [provider]  multivitamin-lutein (OCUVITE-LUTEIN) CAPS capsule Take 1 capsule by mouth daily.    [provider]  NON FORMULARY Take 1 tablet by mouth in the morning and at bedtime. cognium    [provider]  NON FORMULARY Take 1 tablet by mouth daily. Calcium magnesium potassium    [provider]   OVER THE COUNTER MEDICATION Take 1 tablet by mouth daily. Calcium 500mg , Magnesium 500mg , Potassium 99mg     [provider]  pravastatin (PRAVACHOL) 40 MG tablet Take 1 tablet (40 mg total) by mouth every other day. 07/28/21   [name], MD  pregabalin (LYRICA) 100 MG capsule Take 1 capsule (100 mg total) by mouth 2 (two) times daily. 03/16/21   [name], MD  SUMAtriptan (IMITREX) 25 MG tablet Take 25 mg by mouth every 2 (two) hours as needed for migraine. May repeat in 2 hours if headache persists or recurs.    [provider]  tiZANidine (ZANAFLEX) 4 MG tablet Take 1 tablet (4 mg total) by mouth at bedtime as needed for muscle spasms. 07/27/21   [name], MD  traMADol (ULTRAM) 50 MG tablet Take 1-2 tablets (50-100 mg total) by mouth every 6 (six) hours as needed (pain). 08/10/18   Judith Part, MD  valACYclovir (VALTREX) 500 MG tablet Take 1 tablet (500 mg total) by mouth daily. 02/18/21   [name], MD   Social History   Socioeconomic History   Marital status: Married    Spouse name: Not on file   Number of children: Not on file   Years of education: Not on file   Highest education level: Not on file  Occupational History   Not on file  Tobacco Use   Smoking status: Former    Packs/day: 0.50    Years: 30.00    Pack years: 15.00    Types: Cigarettes    Quit date: 07/24/1993    Years since quitting: 28.0   Smokeless tobacco: Never  Vaping Use   Vaping Use: Never used  Substance and Sexual Activity   Alcohol use: No   Drug use: No   Sexual activity: Not Currently  Other Topics Concern   Not on file  Social History Narrative   Current Social History 04/25/2017            Patient lives with husband in one level home 04/25/2017   Transportation: Patient has own vehicle  04/25/2017   Important Relationships God, husband and children 04/25/2017    Pets: None 04/25/2017   Education / Work:  BSN/ Retired Therapist, 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 This is an Surveyor, minerals but also can be a burden.  04/25/2017   L. Silvano Rusk, RN, BSN  Social Determinants of Health   Financial Resource Strain: Not on file  Food Insecurity: Not on file  Transportation Needs: Not on file  Physical Activity: Not on file  Stress: Not on file  Social Connections: Not on file   Family History  Problem Relation Age of Onset   Diabetes Mother    Stroke Mother    Hypertension Mother    Diabetes Father    Kidney disease Father    Heart attack Father    Diabetes Sister    Heart disease Sister    Heart attack Sister    Breast cancer Sister        90s   Diabetes Sister    Gout Sister    Alcohol abuse Brother     ROS: Currently denies lightheadedness, dizziness, Fever, chills, CP, SOB.   No personal history of DVT, PE, MI, or CVA. No loose teeth or dentures All other systems have been reviewed and were otherwise currently negative with the exception of those mentioned in the HPI and as above.  Objective: Vitals: Ht: 5'3" Wt: 207.9 lbs Temp: 98.5 BP: 128/77 Pulse: 76 O2 97% on room air.   Physical Exam: General: Alert, NAD. Trendelenberg Gait  HEENT: EOMI, Good Neck Extension  Pulm: No increased work of breathing.  Clear B/L A/P w/o crackle or wheeze.  CV: RRR, No m/g/r appreciated  GI: soft, NT, ND. BS x 4 quadrants Neuro: CN II-XII grossly intact without focal deficit.  Sensation intact distally Skin: No lesions in the area of chief complaint MSK/Surgical Site: right lateral aspect of hip TTP. Hip pain with ROM. - [place]. Decreased strength.   NVI.    Imaging Review Plain radiographs demonstrate moderate degenerative joint disease of the right hip.   The bone quality appears to be fair for age and reported activity level.  Preoperative templating of the joint replacement has been completed, documented, and submitted to the Operating Room personnel in order to optimize intra-operative equipment management.  Assessment: OA RIGHT HIP Active Problems:   * No active hospital problems. *   Plan: Plan for Procedure(s): TOTAL HIP ARTHROPLASTY  The patient history, physical exam, clinical judgement of the provider and imaging are consistent with end stage degenerative joint disease and total joint arthroplasty is deemed medically necessary. The treatment options including medical management, injection therapy, and arthroplasty were discussed at length. The risks and benefits of Procedure(s): TOTAL HIP ARTHROPLASTY were presented and reviewed.  The risks of nonoperative treatment, versus surgical intervention including but not limited to continued pain, aseptic loosening, stiffness, dislocation/subluxation, infection, bleeding, nerve injury, blood clots, cardiopulmonary complications, morbidity, mortality, among others were discussed. The patient verbalizes understanding and wishes to proceed with the plan.  Patient is being admitted for surgery, pain control, PT, prophylactic antibiotics, VTE prophylaxis, progressive ambulation, ADL's and discharge planning. She will spend the night in observation.  Dental prophylaxis discussed and recommended for 2 years postoperatively.  The patient does meet the criteria for TXA which will be used perioperatively.   ASA 81 mg BID will be used postoperatively for DVT prophylaxis in addition to SCDs, and early ambulation. Plan for Tramadol 50mg  1-2 tablets q4-6h, Mobic, Tylenol for pain.   Phenergan for nausea and vomiting. Colace, Miralax, or Sennakot for constipation. Pharmacy- [facility] in  [place] The patient is planning to be discharged home with OPPT and into the care of her husband Will who can be reached at [phone]. Follow up appt 09-08-21 at  4:15pm     Alisa Graff Office [phone] 08/03/2021 7:23 PM

## 2021-08-03 NOTE — Progress Notes (Signed)
Please notify patient stress test is overall low risk and clearance has been sent to surgeon's office.

## 2021-08-04 ENCOUNTER — Other Ambulatory Visit: Payer: Self-pay

## 2021-08-04 MED ORDER — HYDROCHLOROTHIAZIDE 25 MG PO TABS: 25.0000 mg | ORAL_TABLET | Freq: Every day | ORAL | 1 refills | Status: DC

## 2021-08-04 MED ORDER — PREGABALIN 100 MG PO CAPS: 100.0000 mg | ORAL_CAPSULE | Freq: Two times a day (BID) | ORAL | 1 refills | Status: DC

## 2021-08-04 MED ORDER — VALACYCLOVIR HCL 500 MG PO TABS: 500.0000 mg | ORAL_TABLET | Freq: Every day | ORAL | 1 refills | Status: DC

## 2021-08-04 MED ORDER — SUMATRIPTAN SUCCINATE 25 MG PO TABS: ORAL_TABLET | ORAL | 2 refills | Status: DC

## 2021-08-08 ENCOUNTER — Telehealth: Payer: Self-pay | Admitting: Family Medicine

## 2021-08-08 NOTE — Telephone Encounter (Signed)
HIPAA compliant callback message left.  Please, advice patient when she calls of the info below:   Your ECHO report looks reassuring. Please, follow-up with Cardiology for stress test. Your cardiologist should have access to this ECHO report for review for your final cardiology clearance. I already completed your medical clearance and faxed it to your surgeon. Please, call me if you have any questions or concerns. All the best with your surgery.

## 2021-08-11 ENCOUNTER — Other Ambulatory Visit (HOSPITAL_COMMUNITY): Payer: Medicare Other

## 2021-08-11 NOTE — Progress Notes (Signed)
Covid test on 08/18/21 at 0945am come thru main entrance at  [facility].  Have a seat in the lobby to the right as you come thru the door.  Call [phone] and let them know you are here for covid testing.                 Your procedure is scheduled on:   08/22/21.   Report to [facility] Main  Entrance   Report to admitting at   [phone]     Call this number if you have problems the morning of surgery [phone]    REMEMBER: NO  SOLID FOOD CANDY OR GUM AFTER MIDNIGHT. CLEAR LIQUIDS UNTIL   0430am       . NOTHING BY MOUTH EXCEPT CLEAR LIQUIDS UNTIL  0430am   . PLEASE FINISH ENSURE DRINK PER SURGEON ORDER  WHICH NEEDS TO BE COMPLETED AT   0430am    .      CLEAR LIQUID DIET   Foods Allowed                                                                    Coffee and tea, regular and decaf                            Fruit ices (not with fruit pulp)                                      Iced Popsicles                                    Carbonated beverages, regular and diet                                    Cranberry, grape and apple juices Sports drinks like Gatorade Lightly seasoned clear broth or consume(fat free) Sugar, honey syrup ___________________________________________________________________      BRUSH YOUR TEETH MORNING OF SURGERY AND RINSE YOUR MOUTH OUT, NO CHEWING GUM CANDY OR MINTS.     Take these medicines the morning of surgery with A SIP OF WATER:  pepcid, lyrica   DO NOT TAKE ANY DIABETIC MEDICATIONS DAY OF YOUR SURGERY                               You may not have any metal on your body including hair pins and              piercings  Do not wear jewelry, make-up, lotions, powders or perfumes, deodorant             Do not wear nail polish on your fingernails.  Do not shave  48 hours prior to surgery.              Men may shave face and neck.   Do not bring valuables to the hospital. [facility] IS NOT  RESPONSIBLE   FOR  VALUABLES.  Contacts, dentures or bridgework may not be worn into surgery.  Leave suitcase in the car. After surgery it may be brought to your room.     Patients discharged the day of surgery will not be allowed to drive home. IF YOU ARE HAVING SURGERY AND GOING HOME THE SAME DAY, YOU MUST HAVE AN ADULT TO DRIVE YOU HOME AND BE WITH YOU FOR 24 HOURS. YOU MAY GO HOME BY TAXI OR UBER OR ORTHERWISE, BUT AN ADULT MUST ACCOMPANY YOU HOME AND STAY WITH YOU FOR 24 HOURS.  Name and phone number of your driver:  Special Instructions: N/A              Please read over the following fact sheets you were given: _____________________________________________________________________  [facility] - Preparing for Surgery Before surgery, you can play an important role.  Because skin is not sterile, your skin needs to be as free of germs as possible.  You can reduce the number of germs on your skin by washing with CHG (chlorahexidine gluconate) soap before surgery.  CHG is an antiseptic cleaner which kills germs and bonds with the skin to continue killing germs even after washing. Please DO NOT use if you have an allergy to CHG or antibacterial soaps.  If your skin becomes reddened/irritated stop using the CHG and inform your nurse when you arrive at Short Stay. Do not shave (including legs and underarms) for at least 48 hours prior to the first CHG shower.  You may shave your face/neck. Please follow these instructions carefully:  1.  Shower with CHG Soap the night before surgery and the  morning of Surgery.  2.  If you choose to wash your hair, wash your hair first as usual with your  normal  shampoo.  3.  After you shampoo, rinse your hair and body thoroughly to remove the  shampoo.                           4.  Use CHG as you would any other liquid soap.  You can apply chg directly  to the skin and wash                       Gently with a scrungie or clean washcloth.  5.  Apply the CHG Soap to your body ONLY  FROM THE NECK DOWN.   Do not use on face/ open                           Wound or open sores. Avoid contact with eyes, ears mouth and genitals (private parts).                       Wash face,  Genitals (private parts) with your normal soap.             6.  Wash thoroughly, paying special attention to the area where your surgery  will be performed.  7.  Thoroughly rinse your body with warm water from the neck down.  8.  DO NOT shower/wash with your normal soap after using and rinsing off  the CHG Soap.                9.  Pat yourself dry with a clean towel.            10.  Wear clean pajamas.            11.  Place clean sheets on your bed the night of your first shower and do not  sleep with pets. Day of Surgery : Do not apply any lotions/deodorants the morning of surgery.  Please wear clean clothes to the hospital/surgery center.  FAILURE TO FOLLOW THESE INSTRUCTIONS MAY RESULT IN THE CANCELLATION OF YOUR SURGERY PATIENT SIGNATURE_________________________________  NURSE SIGNATURE__________________________________  ________________________________________________________________________

## 2021-08-11 NOTE — Progress Notes (Addendum)
Anesthesia Review:  PCP: DR Andrena Mews LOV 07/27/21  Cardiologist : 07/29/21- LOV Lawerance Cruel, PAC with [facility]  Chest x-ray : EKG :07/29/21  Echo : 08/01/21  Stress test:08/02/2021 Cardiac Cath :  Activity level: can do a flight of stairs withoiut difficulty  Sleep Study/ CPAP : none  Fasting Blood Sugar :      / Checks Blood Sugar -- times a day:   Blood Thinner/ Instructions /Last Dose: ASA / Instructions/ Last Dose :   Covid test on 1/26 at 0945am  Hgba1c-07/27/21- 5.9  Hgba1c-08/15/21- 6.2  Bmp and CBC on 07/27/21.  Prediabetes - checks glucose 2 x per month

## 2021-08-15 ENCOUNTER — Encounter (HOSPITAL_COMMUNITY): Payer: Self-pay

## 2021-08-15 ENCOUNTER — Encounter (HOSPITAL_COMMUNITY)
Admission: RE | Admit: 2021-08-15 | Discharge: 2021-08-15 | Disposition: A | Discharge status: Home / Self Care | Payer: Medicare Other | Source: Ambulatory Visit | Attending: Orthopedic Surgery | Admitting: Orthopedic Surgery

## 2021-08-15 ENCOUNTER — Other Ambulatory Visit: Payer: Self-pay

## 2021-08-15 VITALS — BP 137/70 | HR 71 | Temp 98.2°F | Resp 16 | Ht 62.0 in | Wt 204.0 lb

## 2021-08-15 DIAGNOSIS — Z01818 Encounter for other preprocedural examination: Secondary | ICD-10-CM

## 2021-08-15 DIAGNOSIS — R7303 Prediabetes: Secondary | ICD-10-CM

## 2021-08-15 DIAGNOSIS — Z01812 Encounter for preprocedural laboratory examination: Secondary | ICD-10-CM | POA: Insufficient documentation

## 2021-08-15 HISTORY — DX: Prediabetes: R73.03

## 2021-08-15 LAB — CBC
HCT: 42.1 % (ref 36.0–46.0)
Hemoglobin: 14 g/dL (ref 12.0–15.0)
MCH: 30.5 pg (ref 26.0–34.0)
MCHC: 33.3 g/dL (ref 30.0–36.0)
MCV: 91.7 fL (ref 80.0–100.0)
Platelets: 197 10*3/uL (ref 150–400)
RBC: 4.59 MIL/uL (ref 3.87–5.11)
RDW: 13.4 % (ref 11.5–15.5)
WBC: 7.1 10*3/uL (ref 4.0–10.5)
nRBC: 0 % (ref 0.0–0.2)

## 2021-08-15 LAB — HEMOGLOBIN A1C
Hgb A1c MFr Bld: 6.2 % — ABNORMAL HIGH (ref 4.8–5.6)
Mean Plasma Glucose: 131 mg/dL

## 2021-08-15 LAB — SURGICAL PCR SCREEN
MRSA, PCR: NEGATIVE
Staphylococcus aureus: NEGATIVE

## 2021-08-15 LAB — BASIC METABOLIC PANEL
Anion gap: 7 (ref 5–15)
BUN: 16 mg/dL (ref 8–23)
CO2: 31 mmol/L (ref 22–32)
Calcium: 9.9 mg/dL (ref 8.9–10.3)
Chloride: 102 mmol/L (ref 98–111)
Creatinine, Ser: 0.48 mg/dL (ref 0.44–1.00)
GFR, Estimated: >60 mL/min (ref >60)
Glucose, Bld: 91 mg/dL (ref 70–99)
Potassium: 3.4 mmol/L — ABNORMAL LOW (ref 3.5–5.1)
Sodium: 140 mmol/L (ref 135–145)

## 2021-08-15 LAB — GLUCOSE, CAPILLARY: Glucose-Capillary: 98 mg/dL (ref 70–99)

## 2021-08-15 NOTE — Progress Notes (Signed)
I connected with the patient on 08/17/21 by a telephone call and verified that I am speaking with the correct person using two identifiers.    I offered the patient a video enabled application for a virtual visit. Unfortunately, this could not be accomplished due to technical difficulties/lack of video enabled phone/computer. I discussed the limitations of evaluation and management by telemedicine and the availability of in person appointments. The patient expressed understanding and agreed to proceed.   This visit type was conducted due to national recommendations for restrictions regarding the COVID-19 Pandemic (e.g. social distancing).  This format is felt to be most appropriate for this patient at this time.  All issues noted in this document were discussed and addressed.  No physical exam was performed (except for noted visual exam findings with Tele health visits).  The patient has consented to conduct a Tele health visit and understands insurance will be billed.    Primary Physician/Referring:  [name], MD  Patient ID: Angie [name], female    DOB: 1946/06/10, 76 y.o.   MRN: [id number]  Chief Complaint  Patient presents with   Pre-op Exam   Results   Follow-up   HPI:    Angie [name]  is a 76 y.o. AA female with hypertension, hyperlipidemia, prediabetes, 15 year pack history (quit 1990), family history of premature CAD (sister with MI in 99s).  Patient also has history of arthritis and vertigo.  She is referred to our office for preoperative risk stratification prior to right total hip arthroplasty at the end of this month.  Patient presents for 4-week follow-up for results of cardiac testing.  Last office visit given patient's dyspnea on exertion and multiple cardiovascular risk factors shared decision was to proceed with stress testing, patient's PCP had already ordered echocardiogram.  Echocardiogram revealed normal LVEF with grade 1 diastolic dysfunction and no  other significant abnormality.  Stress test was overall low risk.  Patient was therefore advised that she may proceed with right hip arthroplasty with low cardiovascular risk.   Patient reports she is feeling well overall without specific complaints.  Denies chest pain, palpitations, syncope, near syncope, orthopnea, PND, leg edema.  She is currently scheduled for hip arthroplasty 08/22/2021.   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  Never received treatment.  No signs of active disease and low risk for reactivation.  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  Patient declines referral to health department to discuss at this time.    Pre-diabetes    Past Surgical History:  Procedure Laterality Date   CARPAL TUNNEL RELEASE Bilateral 2012   CHOLECYSTECTOMY  1982   MAXIMUM ACCESS (MAS)POSTERIOR LUMBAR INTERBODY FUSION (PLIF) 2 LEVEL N/A 08/08/2018   Procedure: Decompression and  Fusion of Lumbar Four-Five Lumbar Five-Sacral One, Pedicle Screws of Lumbar Four- Lumbar Five, Lumbar Five- Sacral One, Lumbar Four-Lumbar Five Interbodies;  Surgeon: Erline Levine, MD;  Location: [facility];  Service: Neurosurgery;  Laterality: N/A;  Decompression and  Fusion of Lumbar Four-Five Lumbar Five-Sacral  One, Pedicle Screws of Lumbar Four- Lumbar Five, Lumbar Fiv   ROTATOR CUFF REPAIR Bilateral 2009   TUBAL LIGATION     Family History  Problem Relation Age of Onset   Diabetes Mother    Stroke Mother    Hypertension Mother    Diabetes Father    Kidney disease Father    Heart attack Father     Diabetes Sister    Heart disease Sister    Heart attack Sister    Breast cancer Sister        66s   Diabetes Sister    Gout Sister    Alcohol abuse Brother     Social History   Tobacco Use   Smoking status: Former    Packs/day: 0.50    Years: 30.00    Pack years: 15.00    Types: Cigarettes    Quit date: 07/24/1993    Years since quitting: 28.0   Smokeless tobacco: Never  Substance Use Topics   Alcohol use: No   Marital Status: Married   ROS  Review of Systems  Constitutional: Negative for malaise/fatigue and weight gain.  Cardiovascular:  Positive for dyspnea on exertion (mild, stable). Negative for chest pain, claudication, leg swelling, near-syncope, orthopnea, palpitations, paroxysmal nocturnal dyspnea and syncope.  Neurological:  Negative for dizziness.   Objective  Due to telehealth nature of today's visit unable to obtain complete vitals as patient doe snot have home monitoring equipment. Also unable to complete physical exam, below is physical exam from last office visit for reference.   Height 5\' 2"  (1.575 m), weight 205 lb (93 kg).  Vitals with BMI 08/17/2021 08/15/2021 07/29/2021  Height 5\' 2"  5\' 2"  5\' 2"   Weight 205 lbs 204 lbs 206 lbs  BMI 37.49 92.0 10.07  Systolic - 121 975  Diastolic - 70 81  Pulse - 71 87      Physical Exam Vitals reviewed.  Constitutional:      Appearance: She is obese.  HENT:     Head: Normocephalic and atraumatic.  Cardiovascular:     Rate and Rhythm: Normal rate and regular rhythm.     Pulses: Intact distal pulses.     Heart sounds: S1 normal and S2 normal. No murmur heard.   No gallop.  Pulmonary:     Effort: Pulmonary effort is normal. No respiratory distress.     Breath sounds: No wheezing, rhonchi or rales.  Musculoskeletal:     Right lower leg: No edema.     Left lower leg: No edema.  Neurological:     Mental Status: She is alert.    Laboratory examination:   Recent Labs    07/27/21 1120 08/15/21 1031  NA 142  140  K 4.0 3.4*  CL 102 102  CO2 29 31  GLUCOSE 106* 91  BUN 20 16  CREATININE 0.73 0.48  CALCIUM 10.1 9.9  GFRNONAA  --  >60   estimated creatinine clearance is 65.5 mL/min (by C-G formula based on SCr of 0.48 mg/dL).  CMP Latest Ref Rng & Units 08/15/2021 07/27/2021 01/07/2020  Glucose 70 - 99 mg/dL 91 106(H) 122(H)  BUN 8 - 23 mg/dL 16 20 16   Creatinine 0.44 - 1.00 mg/dL 0.48 0.73 0.72  Sodium 135 - 145 mmol/L 140 142 142  Potassium 3.5 - 5.1 mmol/L 3.4(L) 4.0 3.9  Chloride 98 - 111 mmol/L 102 102 102  CO2 22 - 32 mmol/L 31 29 25   Calcium 8.9 - 10.3 mg/dL 9.9 10.1 9.7  Total Protein 6.0 - 8.5 g/dL - - 6.6  Total Bilirubin 0.0 - 1.2 mg/dL - - 0.7  Alkaline Phos 48 - 121 IU/L - - 82  AST 0 - 40 IU/L - - 22  ALT 0 - 32 IU/L - - 20   CBC Latest Ref Rng & Units 08/15/2021 07/27/2021 11/09/2020  WBC 4.0 - 10.5 K/uL 7.1 7.5 7.8  Hemoglobin 12.0 - 15.0 g/dL 14.0 13.5 13.6  Hematocrit 36.0 - 46.0 % 42.1 41.2 40.8  Platelets 150 - 400 K/uL 197 223 201    Lipid Panel Recent Labs    07/27/21 1120  CHOL 197  TRIG 146  LDLCALC 113*  HDL 58  CHOLHDL 3.4    HEMOGLOBIN A1C Lab Results  Component Value Date   HGBA1C 6.2 (H) 08/15/2021   MPG 131 08/15/2021   TSH No results for input(s): TSH in the last 8760 hours.  External labs:   None   Allergies  No Known Allergies   Medications Prior to Visit:   Outpatient Medications Prior to Visit  Medication Sig Dispense Refill   clobetasol cream (TEMOVATE) 6.64 % Apply 1 application topically 2 (two) times daily. (Patient taking differently: Apply 1 application topically 2 (two) times daily as needed (irritation).) 60 g 1   cycloSPORINE (RESTASIS) 0.05 % ophthalmic emulsion Place 1 drop into both eyes 2 (two) times daily.     diclofenac Sodium (VOLTAREN) 1 % GEL Apply 1 application topically 2 (two) times daily as needed (pain).     docusate sodium (COLACE) 100 MG capsule Take 1 capsule (100 mg total) by mouth 2 (two) times daily.  (Patient taking differently: Take 100 mg by mouth daily as needed for moderate constipation.) 10 capsule 0   famotidine (PEPCID) 40 MG tablet Take 40 mg by mouth 2 (two) times daily.     hydrochlorothiazide (HYDRODIURIL) 25 MG tablet Take 1 tablet (25 mg total) by mouth daily. 90 tablet 1   melatonin 3 MG TABS tablet Take 3 mg by mouth at bedtime.     Multiple Vitamin (MULTIVITAMIN) capsule Take 1 capsule by mouth daily.     NON FORMULARY Take 1 tablet by mouth in the morning and at bedtime. Cognium     OVER THE COUNTER MEDICATION Take 1 tablet by mouth daily. Calcium 500mg , Magnesium 500mg , Potassium 99mg      pravastatin (PRAVACHOL) 40 MG tablet Take 1 tablet (40 mg total) by mouth every other day. (Patient taking differently: Take 40 mg by mouth daily.) 90 tablet 1   pregabalin (LYRICA) 100 MG capsule Take 1 capsule (100 mg total) by mouth 2 (two) times daily. 180 capsule 1   SUMAtriptan (IMITREX) 25 MG tablet Take 1 tab for Migraine headache. May repeat dose 2-3 hrs later if there is no improvement, not more than 100 mg per day. 10 tablet 2   tiZANidine (ZANAFLEX) 4 MG tablet Take 1 tablet (4 mg total) by mouth at bedtime as needed for muscle spasms. 30 tablet 0   traMADol (ULTRAM) 50 MG tablet Take 1-2 tablets (50-100 mg total) by mouth every 6 (six) hours as needed (pain). 30 tablet 0   valACYclovir (VALTREX) 500 MG tablet Take 1 tablet (500 mg total) by mouth daily. 90 tablet 1   No facility-administered medications prior to visit.   Final Medications at End of Visit    Current Meds  Medication Sig   clobetasol cream (TEMOVATE) 4.03 % Apply 1 application topically 2 (two) times daily. (Patient taking differently:  Apply 1 application topically 2 (two) times daily as needed (irritation).)   cycloSPORINE (RESTASIS) 0.05 % ophthalmic emulsion Place 1 drop into both eyes 2 (two) times daily.   diclofenac Sodium (VOLTAREN) 1 % GEL Apply 1 application topically 2 (two) times daily as needed  (pain).   docusate sodium (COLACE) 100 MG capsule Take 1 capsule (100 mg total) by mouth 2 (two) times daily. (Patient taking differently: Take 100 mg by mouth daily as needed for moderate constipation.)   famotidine (PEPCID) 40 MG tablet Take 40 mg by mouth 2 (two) times daily.   hydrochlorothiazide (HYDRODIURIL) 25 MG tablet Take 1 tablet (25 mg total) by mouth daily.   melatonin 3 MG TABS tablet Take 3 mg by mouth at bedtime.   Multiple Vitamin (MULTIVITAMIN) capsule Take 1 capsule by mouth daily.   NON FORMULARY Take 1 tablet by mouth in the morning and at bedtime. Cognium   OVER THE COUNTER MEDICATION Take 1 tablet by mouth daily. Calcium 500mg , Magnesium 500mg , Potassium 99mg    pravastatin (PRAVACHOL) 40 MG tablet Take 1 tablet (40 mg total) by mouth every other day. (Patient taking differently: Take 40 mg by mouth daily.)   pregabalin (LYRICA) 100 MG capsule Take 1 capsule (100 mg total) by mouth 2 (two) times daily.   SUMAtriptan (IMITREX) 25 MG tablet Take 1 tab for Migraine headache. May repeat dose 2-3 hrs later if there is no improvement, not more than 100 mg per day.   tiZANidine (ZANAFLEX) 4 MG tablet Take 1 tablet (4 mg total) by mouth at bedtime as needed for muscle spasms.   traMADol (ULTRAM) 50 MG tablet Take 1-2 tablets (50-100 mg total) by mouth every 6 (six) hours as needed (pain).   valACYclovir (VALTREX) 500 MG tablet Take 1 tablet (500 mg total) by mouth daily.   Radiology:   No results found.  Cardiac Studies:   Lexiscan Sestamibi stress test 08/01/2021: 1 Day Rest/Stress Protocol. Stress EKG is non-diagnostic, as this is pharmacological stress test using Lexiscan. Normal myocardial perfusion without convincing evidence of reversible myocardial ischemia or prior infarct. Left ventricular size normal, no obvious regional wall motion abnormalities, calculated LVEF 78%. Low risk study.  Echocardiogram 08/01/2021:   1. Left ventricular ejection fraction, by  estimation, is 60 to 65%. The  left ventricle has normal function. The left ventricle has no regional  wall motion abnormalities. Left ventricular diastolic parameters are  consistent with Grade I diastolic  dysfunction (impaired relaxation). The average left ventricular global  longitudinal strain is -20.3 %. The global longitudinal strain is normal.   2. Right ventricular systolic function is normal. The right ventricular  size is normal. Tricuspid regurgitation signal is inadequate for assessing  PA pressure.   3. The mitral valve is normal in structure. No evidence of mitral valve  regurgitation. No evidence of mitral stenosis.   4. The aortic valve is tricuspid. Aortic valve regurgitation is not  visualized. No aortic stenosis is present.   5. The inferior vena cava is normal in size with greater than 50%  respiratory variability, suggesting right atrial pressure of 3 mmHg.  Comparison(s): No significant change from prior study.   EKG:   07/29/2021: Sinus rhythm at a rate of 85 bpm.  Normal axis.  Left atrial enlargement.  Poor R wave progression, cannot exclude anteroseptal infarct old.  Nonspecific ST-T wave abnormalities, unchanged compared to previous in 2020.   Assessment     ICD-10-CM   1. Preoperative cardiovascular examination  Z01.810  There are no discontinued medications.   No orders of the defined types were placed in this encounter.   Recommendations:   Laniah [name] is a 76 y.o. AA female with hypertension, hyperlipidemia, prediabetes, 15 year pack history (quit 1990), family history of premature CAD (sister with MI in 71s).  Patient also has history of arthritis and vertigo.  She is referred to our office for preoperative risk stratification prior to right total hip arthroplasty at the end of this month.  Patient presents for 4-week follow-up for results of cardiac testing.  Last office visit given patient's dyspnea on exertion and multiple  cardiovascular risk factors shared decision was to proceed with stress testing, patient's PCP had already ordered echocardiogram.  Echocardiogram revealed normal LVEF with grade 1 diastolic dysfunction and no other significant abnormality.  Stress test was overall low risk.  Patient was therefore advised that she may proceed with right hip arthroplasty with low cardiovascular risk.   Reviewed and discussed results of echocardiogram as well as stress testing with patient, details above.  Patient's questions were addressed to her satisfaction.  Follow-up in 1 year, sooner if needed.   Alethia Berthold, PA-C 08/17/2021, 10:01 AM Office: [phone]

## 2021-08-17 ENCOUNTER — Encounter: Payer: Self-pay | Admitting: Student

## 2021-08-17 ENCOUNTER — Ambulatory Visit: Payer: Medicare Other | Admitting: Student

## 2021-08-17 ENCOUNTER — Other Ambulatory Visit: Payer: Self-pay

## 2021-08-17 VITALS — Ht 62.0 in | Wt 205.0 lb

## 2021-08-17 DIAGNOSIS — Z01810 Encounter for preprocedural cardiovascular examination: Secondary | ICD-10-CM | POA: Diagnosis not present

## 2021-08-17 NOTE — Care Plan (Signed)
Ortho Bundle Case Management Note  Patient Details  Name: Angie [name] MRN: [id number] Date of Birth: [birth year]    Met with patient in the office for H&P visit. She will discharge to home with family to assist. Rolling walker ordered from [facility]. HHPT referral to [facility] OPPT set up with [facility]. Patient and MD in agreement with plan. Choice offered                  DME Arranged:  Walker rolling DME Agency:  [employer]  HH Arranged:  PT HH Agency:  [facility]  Additional Comments: Please contact me with any questions of if this plan should need to change.  Ladell Heads,  [facility]  [phone] 08/17/2021, 2:56 PM

## 2021-08-18 ENCOUNTER — Encounter (HOSPITAL_COMMUNITY)
Admission: RE | Admit: 2021-08-18 | Discharge: 2021-08-18 | Disposition: A | Discharge status: Home / Self Care | Payer: Medicare Other | Source: Ambulatory Visit | Attending: Orthopedic Surgery | Admitting: Orthopedic Surgery

## 2021-08-18 ENCOUNTER — Encounter: Payer: Self-pay | Admitting: Family Medicine

## 2021-08-18 DIAGNOSIS — Z20822 Contact with and (suspected) exposure to covid-19: Secondary | ICD-10-CM | POA: Diagnosis not present

## 2021-08-18 DIAGNOSIS — Z01812 Encounter for preprocedural laboratory examination: Secondary | ICD-10-CM | POA: Diagnosis not present

## 2021-08-18 LAB — SARS CORONAVIRUS 2 (TAT 6-24 HRS): SARS Coronavirus 2: NEGATIVE

## 2021-08-22 ENCOUNTER — Other Ambulatory Visit: Payer: Self-pay

## 2021-08-22 ENCOUNTER — Ambulatory Visit (HOSPITAL_COMMUNITY): Payer: Medicare Other | Admitting: Anesthesiology

## 2021-08-22 ENCOUNTER — Ambulatory Visit (HOSPITAL_COMMUNITY): Payer: Medicare Other | Admitting: Physician Assistant

## 2021-08-22 ENCOUNTER — Ambulatory Visit (HOSPITAL_COMMUNITY): Payer: Medicare Other

## 2021-08-22 ENCOUNTER — Ambulatory Visit (HOSPITAL_COMMUNITY)
Admission: RE | Admit: 2021-08-22 | Discharge: 2021-08-23 | Disposition: A | Discharge status: Home / Self Care | Payer: Medicare Other | Attending: Orthopedic Surgery | Admitting: Orthopedic Surgery

## 2021-08-22 ENCOUNTER — Encounter (HOSPITAL_COMMUNITY)
Admission: RE | Disposition: A | Discharge status: Home / Self Care | Payer: Self-pay | Source: Home / Self Care | Attending: Orthopedic Surgery

## 2021-08-22 ENCOUNTER — Encounter (HOSPITAL_COMMUNITY): Payer: Self-pay | Admitting: Orthopedic Surgery

## 2021-08-22 DIAGNOSIS — Z6837 Body mass index (BMI) 37.0-37.9, adult: Secondary | ICD-10-CM | POA: Insufficient documentation

## 2021-08-22 DIAGNOSIS — M1611 Unilateral primary osteoarthritis, right hip: Secondary | ICD-10-CM | POA: Diagnosis not present

## 2021-08-22 DIAGNOSIS — Z87891 Personal history of nicotine dependence: Secondary | ICD-10-CM | POA: Insufficient documentation

## 2021-08-22 DIAGNOSIS — I10 Essential (primary) hypertension: Secondary | ICD-10-CM | POA: Insufficient documentation

## 2021-08-22 DIAGNOSIS — Z471 Aftercare following joint replacement surgery: Secondary | ICD-10-CM | POA: Diagnosis not present

## 2021-08-22 DIAGNOSIS — K219 Gastro-esophageal reflux disease without esophagitis: Secondary | ICD-10-CM | POA: Diagnosis not present

## 2021-08-22 DIAGNOSIS — Z419 Encounter for procedure for purposes other than remedying health state, unspecified: Secondary | ICD-10-CM

## 2021-08-22 DIAGNOSIS — Z79899 Other long term (current) drug therapy: Secondary | ICD-10-CM | POA: Diagnosis not present

## 2021-08-22 DIAGNOSIS — Z96641 Presence of right artificial hip joint: Secondary | ICD-10-CM | POA: Diagnosis not present

## 2021-08-22 DIAGNOSIS — E669 Obesity, unspecified: Secondary | ICD-10-CM | POA: Insufficient documentation

## 2021-08-22 DIAGNOSIS — Z9889 Other specified postprocedural states: Secondary | ICD-10-CM

## 2021-08-22 HISTORY — PX: TOTAL HIP ARTHROPLASTY: SHX124

## 2021-08-22 LAB — GLUCOSE, CAPILLARY: Glucose-Capillary: 100 mg/dL — ABNORMAL HIGH (ref 70–99)

## 2021-08-22 SURGERY — ARTHROPLASTY, HIP, TOTAL,POSTERIOR APPROACH
Anesthesia: Spinal | Site: Hip | Laterality: Right

## 2021-08-22 MED ORDER — ACETAMINOPHEN 500 MG PO TABS
1000.0000 mg | ORAL_TABLET | Freq: Once | ORAL | Status: AC
  Administered 2021-08-22: 1000 mg via ORAL
  Filled 2021-08-22: qty 2

## 2021-08-22 MED ORDER — WATER FOR IRRIGATION, STERILE IR SOLN
Status: DC | PRN
  Administered 2021-08-22: 2000 mL

## 2021-08-22 MED ORDER — SENNA 8.6 MG PO TABS
1.0000 | ORAL_TABLET | Freq: Two times a day (BID) | ORAL | Status: DC
  Administered 2021-08-22 – 2021-08-23 (×3): 8.6 mg via ORAL
  Filled 2021-08-22 (×3): qty 1

## 2021-08-22 MED ORDER — HYDROMORPHONE HCL 1 MG/ML IJ SOLN
INTRAMUSCULAR | Status: AC
  Filled 2021-08-22: qty 1

## 2021-08-22 MED ORDER — ACETAMINOPHEN 10 MG/ML IV SOLN: 1000.0000 mg | Freq: Once | INTRAVENOUS | Status: DC | PRN

## 2021-08-22 MED ORDER — ROCURONIUM BROMIDE 10 MG/ML (PF) SYRINGE
PREFILLED_SYRINGE | INTRAVENOUS | Status: DC | PRN
Start: 2021-08-22 — End: 2021-08-22
  Administered 2021-08-22: 50 mg via INTRAVENOUS
  Administered 2021-08-22: 20 mg via INTRAVENOUS
  Administered 2021-08-22: 30 mg via INTRAVENOUS

## 2021-08-22 MED ORDER — HYDROMORPHONE HCL 1 MG/ML IJ SOLN
0.5000 mg | INTRAMUSCULAR | Status: DC | PRN
  Administered 2021-08-22: 1 mg via INTRAVENOUS
  Filled 2021-08-22: qty 1

## 2021-08-22 MED ORDER — LACTATED RINGERS IV SOLN: INTRAVENOUS | Status: DC

## 2021-08-22 MED ORDER — ONDANSETRON HCL 4 MG/2ML IJ SOLN
4.0000 mg | Freq: Four times a day (QID) | INTRAMUSCULAR | Status: DC | PRN
  Administered 2021-08-22 (×2): 4 mg via INTRAVENOUS
  Filled 2021-08-22 (×2): qty 2

## 2021-08-22 MED ORDER — SODIUM CHLORIDE (PF) 0.9 % IJ SOLN
INTRAMUSCULAR | Status: AC
  Filled 2021-08-22: qty 10

## 2021-08-22 MED ORDER — FAMOTIDINE 20 MG PO TABS
40.0000 mg | ORAL_TABLET | Freq: Two times a day (BID) | ORAL | Status: DC
  Administered 2021-08-22 – 2021-08-23 (×2): 40 mg via ORAL
  Filled 2021-08-22 (×2): qty 2

## 2021-08-22 MED ORDER — ONDANSETRON HCL 4 MG PO TABS: 4.0000 mg | ORAL_TABLET | Freq: Four times a day (QID) | ORAL | Status: DC | PRN

## 2021-08-22 MED ORDER — TRANEXAMIC ACID-NACL 1000-0.7 MG/100ML-% IV SOLN
1000.0000 mg | INTRAVENOUS | Status: AC
  Administered 2021-08-22: 1000 mg via INTRAVENOUS
  Filled 2021-08-22: qty 100

## 2021-08-22 MED ORDER — CEFAZOLIN SODIUM-DEXTROSE 2-4 GM/100ML-% IV SOLN
2.0000 g | Freq: Four times a day (QID) | INTRAVENOUS | Status: AC
  Administered 2021-08-22 (×2): 2 g via INTRAVENOUS
  Filled 2021-08-22 (×2): qty 100

## 2021-08-22 MED ORDER — 0.9 % SODIUM CHLORIDE (POUR BTL) OPTIME
TOPICAL | Status: DC | PRN
  Administered 2021-08-22 (×2): 1000 mL

## 2021-08-22 MED ORDER — FENTANYL CITRATE (PF) 250 MCG/5ML IJ SOLN
INTRAMUSCULAR | Status: AC
  Filled 2021-08-22: qty 5

## 2021-08-22 MED ORDER — BUPIVACAINE LIPOSOME 1.3 % IJ SUSP: 10.0000 mL | Freq: Once | INTRAMUSCULAR | Status: DC

## 2021-08-22 MED ORDER — HYDROMORPHONE HCL 1 MG/ML IJ SOLN
0.2500 mg | INTRAMUSCULAR | Status: DC | PRN
  Administered 2021-08-22 (×3): 0.5 mg via INTRAVENOUS
  Filled 2021-08-22 (×2): qty 0.5

## 2021-08-22 MED ORDER — PHENYLEPHRINE HCL (PRESSORS) 10 MG/ML IV SOLN
INTRAVENOUS | Status: DC | PRN
Start: 2021-08-22 — End: 2021-08-22
  Administered 2021-08-22: 80 ug via INTRAVENOUS

## 2021-08-22 MED ORDER — ACETAMINOPHEN 325 MG PO TABS
325.0000 mg | ORAL_TABLET | Freq: Four times a day (QID) | ORAL | Status: DC | PRN
  Administered 2021-08-22 – 2021-08-23 (×2): 650 mg via ORAL
  Filled 2021-08-22 (×2): qty 2

## 2021-08-22 MED ORDER — LIDOCAINE 2% (20 MG/ML) 5 ML SYRINGE
INTRAMUSCULAR | Status: DC | PRN
  Administered 2021-08-22: 100 mg via INTRAVENOUS

## 2021-08-22 MED ORDER — BUPIVACAINE LIPOSOME 1.3 % IJ SUSP
INTRAMUSCULAR | Status: DC | PRN
  Administered 2021-08-22: 20 mL

## 2021-08-22 MED ORDER — FENTANYL CITRATE (PF) 100 MCG/2ML IJ SOLN
INTRAMUSCULAR | Status: AC
  Filled 2021-08-22: qty 2

## 2021-08-22 MED ORDER — KETOROLAC TROMETHAMINE 15 MG/ML IJ SOLN
7.5000 mg | Freq: Four times a day (QID) | INTRAMUSCULAR | Status: DC
  Administered 2021-08-22 – 2021-08-23 (×2): 7.5 mg via INTRAVENOUS
  Filled 2021-08-22 (×2): qty 1

## 2021-08-22 MED ORDER — DIPHENHYDRAMINE HCL 12.5 MG/5ML PO ELIX
12.5000 mg | ORAL_SOLUTION | ORAL | Status: DC | PRN
  Administered 2021-08-22: 12.5 mg via ORAL
  Filled 2021-08-22: qty 5

## 2021-08-22 MED ORDER — POVIDONE-IODINE 10 % EX SWAB: 2.0000 "application " | Freq: Once | CUTANEOUS | Status: DC

## 2021-08-22 MED ORDER — ZOLPIDEM TARTRATE 5 MG PO TABS: 5.0000 mg | ORAL_TABLET | Freq: Every evening | ORAL | Status: DC | PRN

## 2021-08-22 MED ORDER — OXYCODONE HCL 5 MG/5ML PO SOLN: 5.0000 mg | Freq: Once | ORAL | Status: DC | PRN

## 2021-08-22 MED ORDER — PROPOFOL 10 MG/ML IV BOLUS
INTRAVENOUS | Status: DC | PRN
  Administered 2021-08-22: 120 mg via INTRAVENOUS

## 2021-08-22 MED ORDER — ORAL CARE MOUTH RINSE: 15.0000 mL | Freq: Once | OROMUCOSAL | Status: AC

## 2021-08-22 MED ORDER — SUGAMMADEX SODIUM 200 MG/2ML IV SOLN
INTRAVENOUS | Status: DC | PRN
  Administered 2021-08-22: 200 mg via INTRAVENOUS

## 2021-08-22 MED ORDER — HYDROCHLOROTHIAZIDE 25 MG PO TABS
25.0000 mg | ORAL_TABLET | Freq: Every day | ORAL | Status: DC
  Administered 2021-08-23: 25 mg via ORAL
  Filled 2021-08-22: qty 1

## 2021-08-22 MED ORDER — PHENYLEPHRINE 40 MCG/ML (10ML) SYRINGE FOR IV PUSH (FOR BLOOD PRESSURE SUPPORT)
PREFILLED_SYRINGE | INTRAVENOUS | Status: AC
  Filled 2021-08-22: qty 10

## 2021-08-22 MED ORDER — ISOPROPYL ALCOHOL 70 % SOLN
Status: AC
  Filled 2021-08-22: qty 480

## 2021-08-22 MED ORDER — CELECOXIB 200 MG PO CAPS
400.0000 mg | ORAL_CAPSULE | Freq: Once | ORAL | Status: AC
  Administered 2021-08-22: 400 mg via ORAL
  Filled 2021-08-22: qty 2

## 2021-08-22 MED ORDER — BUPIVACAINE LIPOSOME 1.3 % IJ SUSP
INTRAMUSCULAR | Status: AC
  Filled 2021-08-22: qty 20

## 2021-08-22 MED ORDER — ONDANSETRON HCL 4 MG/2ML IJ SOLN: 4.0000 mg | Freq: Once | INTRAMUSCULAR | Status: DC | PRN

## 2021-08-22 MED ORDER — SODIUM CHLORIDE (PF) 0.9 % IJ SOLN
INTRAMUSCULAR | Status: DC | PRN
  Administered 2021-08-22: 60 mL

## 2021-08-22 MED ORDER — METOCLOPRAMIDE HCL 5 MG/ML IJ SOLN
5.0000 mg | Freq: Four times a day (QID) | INTRAMUSCULAR | Status: DC | PRN
  Administered 2021-08-22: 10 mg via INTRAVENOUS
  Filled 2021-08-22: qty 2

## 2021-08-22 MED ORDER — POVIDONE-IODINE 10 % EX SWAB
2.0000 "application " | Freq: Once | CUTANEOUS | Status: AC
  Administered 2021-08-22: 2 via TOPICAL

## 2021-08-22 MED ORDER — SODIUM CHLORIDE 0.9 % IR SOLN
Status: DC | PRN
  Administered 2021-08-22 (×2): 1000 mL

## 2021-08-22 MED ORDER — ONDANSETRON HCL 4 MG/2ML IJ SOLN
INTRAMUSCULAR | Status: DC | PRN
  Administered 2021-08-22: 4 mg via INTRAVENOUS

## 2021-08-22 MED ORDER — TRAMADOL HCL 50 MG PO TABS
50.0000 mg | ORAL_TABLET | Freq: Four times a day (QID) | ORAL | Status: DC | PRN
  Administered 2021-08-23: 100 mg via ORAL
  Filled 2021-08-22: qty 2

## 2021-08-22 MED ORDER — ASPIRIN 81 MG PO CHEW
81.0000 mg | CHEWABLE_TABLET | Freq: Two times a day (BID) | ORAL | Status: DC
  Administered 2021-08-22 – 2021-08-23 (×2): 81 mg via ORAL
  Filled 2021-08-22 (×2): qty 1

## 2021-08-22 MED ORDER — PHENYLEPHRINE HCL-NACL 20-0.9 MG/250ML-% IV SOLN
INTRAVENOUS | Status: AC
  Filled 2021-08-22: qty 500

## 2021-08-22 MED ORDER — CEFAZOLIN SODIUM-DEXTROSE 2-4 GM/100ML-% IV SOLN
2.0000 g | INTRAVENOUS | Status: AC
  Administered 2021-08-22: 2 g via INTRAVENOUS
  Filled 2021-08-22: qty 100

## 2021-08-22 MED ORDER — FENTANYL CITRATE (PF) 100 MCG/2ML IJ SOLN
INTRAMUSCULAR | Status: DC | PRN
Start: 2021-08-22 — End: 2021-08-22
  Administered 2021-08-22: 100 ug via INTRAVENOUS
  Administered 2021-08-22: 50 ug via INTRAVENOUS
  Administered 2021-08-22: 100 ug via INTRAVENOUS

## 2021-08-22 MED ORDER — AMISULPRIDE (ANTIEMETIC) 5 MG/2ML IV SOLN: 10.0000 mg | Freq: Once | INTRAVENOUS | Status: DC | PRN

## 2021-08-22 MED ORDER — DOCUSATE SODIUM 100 MG PO CAPS
100.0000 mg | ORAL_CAPSULE | Freq: Two times a day (BID) | ORAL | Status: DC
  Administered 2021-08-22 – 2021-08-23 (×2): 100 mg via ORAL
  Filled 2021-08-22 (×2): qty 1

## 2021-08-22 MED ORDER — ISOPROPYL ALCOHOL 70 % SOLN
Status: DC | PRN
Start: 2021-08-22 — End: 2021-08-22
  Administered 2021-08-22: 1 via TOPICAL

## 2021-08-22 MED ORDER — PREGABALIN 100 MG PO CAPS
100.0000 mg | ORAL_CAPSULE | Freq: Two times a day (BID) | ORAL | Status: DC
  Administered 2021-08-22 – 2021-08-23 (×2): 100 mg via ORAL
  Filled 2021-08-22 (×2): qty 1

## 2021-08-22 MED ORDER — DEXAMETHASONE SODIUM PHOSPHATE 10 MG/ML IJ SOLN
8.0000 mg | Freq: Once | INTRAMUSCULAR | Status: AC
  Administered 2021-08-22: 8 mg via INTRAVENOUS

## 2021-08-22 MED ORDER — OXYCODONE HCL 5 MG PO TABS: 5.0000 mg | ORAL_TABLET | Freq: Once | ORAL | Status: DC | PRN

## 2021-08-22 MED ORDER — MELATONIN 3 MG PO TABS
3.0000 mg | ORAL_TABLET | Freq: Every day | ORAL | Status: DC
  Administered 2021-08-22: 3 mg via ORAL
  Filled 2021-08-22: qty 1

## 2021-08-22 MED ORDER — PHENOL 1.4 % MT LIQD
1.0000 | OROMUCOSAL | Status: DC | PRN
Start: 2021-08-22 — End: 2021-08-23

## 2021-08-22 MED ORDER — CHLORHEXIDINE GLUCONATE 0.12 % MT SOLN
15.0000 mL | Freq: Once | OROMUCOSAL | Status: AC
  Administered 2021-08-22: 15 mL via OROMUCOSAL

## 2021-08-22 MED ORDER — PHENYLEPHRINE 40 MCG/ML (10ML) SYRINGE FOR IV PUSH (FOR BLOOD PRESSURE SUPPORT)
PREFILLED_SYRINGE | INTRAVENOUS | Status: DC | PRN
  Administered 2021-08-22 (×3): 80 ug via INTRAVENOUS

## 2021-08-22 MED ORDER — MENTHOL 3 MG MT LOZG: 1.0000 | LOZENGE | OROMUCOSAL | Status: DC | PRN

## 2021-08-22 SURGICAL SUPPLY — 70 items
ADH SKN CLS APL DERMABOND .7 (GAUZE/BANDAGES/DRESSINGS) ×1
APL PRP STRL LF DISP 70% ISPRP (MISCELLANEOUS) ×2
BAG COUNTER SPONGE SURGICOUNT (BAG) IMPLANT
BAG DECANTER FOR FLEXI CONT (MISCELLANEOUS) ×2 IMPLANT
BAG SPEC THK2 15X12 ZIP CLS (MISCELLANEOUS) ×1
BAG SPNG CNTER NS LX DISP (BAG)
BAG ZIPLOCK 12X15 (MISCELLANEOUS) ×2 IMPLANT
BLADE SAW SAG 25X90X1.19 (BLADE) IMPLANT
CHLORAPREP W/TINT 26 (MISCELLANEOUS) ×4 IMPLANT
COVER SURGICAL LIGHT HANDLE (MISCELLANEOUS) ×2 IMPLANT
DECANTER SPIKE VIAL GLASS SM (MISCELLANEOUS) ×3 IMPLANT
DERMABOND ADVANCED (GAUZE/BANDAGES/DRESSINGS) ×1
DERMABOND ADVANCED .7 DNX12 (GAUZE/BANDAGES/DRESSINGS) ×1 IMPLANT
DRAPE 3/4 80X56 (DRAPES) ×4 IMPLANT
DRAPE HIP W/POCKET STRL (MISCELLANEOUS) ×2 IMPLANT
DRAPE INCISE IOBAN 66X45 STRL (DRAPES) ×3 IMPLANT
DRAPE INCISE IOBAN 85X60 (DRAPES) ×2 IMPLANT
DRAPE POUCH INSTRU U-SHP 10X18 (DRAPES) ×2 IMPLANT
DRAPE SURG 17X11 SM STRL (DRAPES) ×2 IMPLANT
DRAPE U-SHAPE 47X51 STRL (DRAPES) ×3 IMPLANT
DRESSING AQUACEL AG SP 3.5X10 (GAUZE/BANDAGES/DRESSINGS) ×1 IMPLANT
DRSG AQUACEL AG SP 3.5X10 (GAUZE/BANDAGES/DRESSINGS) ×2
DRSG DERMACEA 8X12 NADH (GAUZE/BANDAGES/DRESSINGS) ×1 IMPLANT
ELECT BLADE TIP CTD 4 INCH (ELECTRODE) ×2 IMPLANT
ELECT REM PT RETURN 15FT ADLT (MISCELLANEOUS) ×2 IMPLANT
FACESHIELD WRAPAROUND (MASK) ×2 IMPLANT
FACESHIELD WRAPAROUND OR TEAM (MASK) IMPLANT
GLOVE SRG 8 PF TXTR STRL LF DI (GLOVE) ×1 IMPLANT
GLOVE SURG ENC TEXT LTX SZ8 (GLOVE) ×5 IMPLANT
GLOVE SURG UNDER POLY LF SZ8 (GLOVE) ×2
GOWN STRL REUS W/TWL XL LVL3 (GOWN DISPOSABLE) ×2 IMPLANT
HANDPIECE INTERPULSE COAX TIP (DISPOSABLE) ×2
HEAD CERAMIC FEMORAL 36MM (Head) ×1 IMPLANT
HOLDER FOLEY CATH W/STRAP (MISCELLANEOUS) ×2 IMPLANT
HOOD PEEL AWAY FLYTE STAYCOOL (MISCELLANEOUS) ×6 IMPLANT
INSERT 0 DEGREE 36 (Miscellaneous) ×1 IMPLANT
KIT BASIN OR (CUSTOM PROCEDURE TRAY) ×2 IMPLANT
KIT TURNOVER KIT A (KITS) IMPLANT
MANIFOLD NEPTUNE II (INSTRUMENTS) ×2 IMPLANT
MARKER SKIN DUAL TIP RULER LAB (MISCELLANEOUS) ×2 IMPLANT
NEEDLE HYPO 22GX1.5 SAFETY (NEEDLE) ×1 IMPLANT
NS IRRIG 1000ML POUR BTL (IV SOLUTION) ×2 IMPLANT
PACK TOTAL JOINT (CUSTOM PROCEDURE TRAY) ×2 IMPLANT
PROTECTOR NERVE ULNAR (MISCELLANEOUS) ×2 IMPLANT
RETRIEVER SUT HEWSON (MISCELLANEOUS) ×2 IMPLANT
SCREW HEX LP 6.5X20 (Screw) ×1 IMPLANT
SCREW HEX LP 6.5X30 (Screw) ×1 IMPLANT
SEALER BIPOLAR AQUA 6.0 (INSTRUMENTS) ×2 IMPLANT
SET HNDPC FAN SPRY TIP SCT (DISPOSABLE) IMPLANT
SHELL TRIDENT II CLUST 50 (Shell) ×1 IMPLANT
SPONGE T-LAP 18X18 ~~LOC~~+RFID (SPONGE) ×6 IMPLANT
SPONGE T-LAP 4X18 ~~LOC~~+RFID (SPONGE) IMPLANT
STEM FEM ACCOLADE 38X102X30 S3 (Stem) ×1 IMPLANT
SUCTION FRAZIER HANDLE 12FR (TUBING) ×2
SUCTION TUBE FRAZIER 12FR DISP (TUBING) ×1 IMPLANT
SUT BONE WAX W31G (SUTURE) ×2 IMPLANT
SUT ETHIBOND #5 BRAIDED 30INL (SUTURE) ×2 IMPLANT
SUT MNCRL AB 3-0 PS2 18 (SUTURE) ×2 IMPLANT
SUT STRATAFIX 0 PDS 27 VIOLET (SUTURE) ×2
SUT STRATAFIX PDO 1 14 VIOLET (SUTURE) ×2
SUT STRATFX PDO 1 14 VIOLET (SUTURE) ×1
SUT VIC AB 2-0 CT2 27 (SUTURE) ×5 IMPLANT
SUTURE STRATFX 0 PDS 27 VIOLET (SUTURE) ×1 IMPLANT
SUTURE STRATFX PDO 1 14 VIOLET (SUTURE) ×1 IMPLANT
SYR 20ML LL LF (SYRINGE) ×4 IMPLANT
TOWEL OR 17X26 10 PK STRL BLUE (TOWEL DISPOSABLE) ×2 IMPLANT
TRAY FOLEY MTR SLVR 14FR STAT (SET/KITS/TRAYS/PACK) ×1 IMPLANT
TRAY FOLEY MTR SLVR 16FR STAT (SET/KITS/TRAYS/PACK) ×1 IMPLANT
TUBE SUCTION HIGH CAP CLEAR NV (SUCTIONS) ×1 IMPLANT
WATER STERILE IRR 1000ML POUR (IV SOLUTION) ×4 IMPLANT

## 2021-08-22 NOTE — Op Note (Addendum)
08/22/2021  10:08 AM  PATIENT:  Angie [name]   MRN: [id number]  PRE-OPERATIVE DIAGNOSIS: End-stage right hip osteoarthritis  POST-OPERATIVE DIAGNOSIS:  same  PROCEDURE:  Procedure(s): TOTAL HIP ARTHROPLASTY  PREOPERATIVE INDICATIONS:    Aira [name] is an 76 y.o. female who has a diagnosis of end-stage right hip osteoarthritis and elected for surgical management after failing conservative treatment.  The risks benefits and alternatives were discussed with the patient including but not limited to the risks of nonoperative treatment, versus surgical intervention including infection, bleeding, nerve injury, periprosthetic fracture, the need for revision surgery, dislocation, leg length discrepancy, blood clots, cardiopulmonary complications, morbidity, mortality, among others, and they were willing to proceed.     OPERATIVE REPORT     SURGEON:  Charlies Constable, MD    ASSISTANT: Izola Price, RNFA, (Present throughout the entire procedure,  necessary for completion of procedure in a timely manner, assisting with retraction, instrumentation, and closure)     ANESTHESIA: General  ESTIMATED BLOOD LOSS: 867 cc    COMPLICATIONS:  None.   COMPONENTS:   Stryker Trident 250 mm cup, two 6.5 millimeter screws, Accolade 2 high offset #3, 36+0 ceramic head Implant Name Type Inv. Item Serial No. Manufacturer Lot No. LRB No. Used Action  SHELL TRIDENT II CLUST 50 - [id number] Shell SHELL TRIDENT II CLUST 50  [employer] [id number] Right 1 Implanted  SCREW HEX LP 6.5X30 - [id number] Screw SCREW HEX LP 6.5X30  [employer] [id number] Right 1 Implanted  SCREW HEX LP 6.5X20 - [id number] Screw SCREW HEX LP 6.5X20  [employer] [id number] Right 1 Implanted  INSERT 0 DEGREE 36 - [id number] Miscellaneous INSERT 0 DEGREE 36  [employer] [id number] Right 1 Implanted  STEM FEM ACCOLADE [id number] S3 - [id number] Stem STEM FEM ACCOLADE [id number] S3  [employer] [id number] Right 1  Implanted  HEAD CERAMIC FEMORAL 36MM - [id number] Head HEAD CERAMIC FEMORAL 36MM  [employer] [id number] Right 1 Implanted      PROCEDURE IN DETAIL:   The patient was met in the holding area and  identified.  The appropriate hip was identified and marked at the operative site.  The patient was then transported to the OR  and  placed under anesthesia.  At that point, the patient was  placed in the lateral decubitus position with the operative side up and  secured to the operating room table  and all bony prominences padded. A subaxillary role was also placed.    The operative lower extremity was prepped from the iliac crest to the distal leg.  Sterile draping was performed.  Time out was performed prior to incision.      A routine posterolateral approach was utilized via sharp dissection  carried down to the subcutaneous tissue.  Gross bleeders were Bovie coagulated.  The iliotibial band was identified and incised along the length of the skin incision through the glute max fascia.  Charnley retractor was placed with care to protect the sciatic nerve posteriorly.  With the hip internally rotated, the piriformis tendon was identified and released from the femoral insertion and tagged with a #5 Ethibond.  A capsulotomy was then performed off the femoral insertion and also tagged with a #5 Ethibond.    The femoral neck was exposed, and I resected the femoral neck based on preoperative templating relative to the lesser trochanter.    I then exposed the deep acetabulum, cleared out any tissue including the ligamentum teres.  After adequate visualization, I excised the labrum.  I  then started reaming with a 42 mm reamer, first medializing to the floor of the cotyloid fossa, and then in the position of the cup aiming towards the greater sciatic notch, matching the version of the transverse acetabular ligament and tucked under the anterior wall. I reamed up to 50 mm reamer with good bony bed preparation and  a 50 mm cup was chosen.  The real cup was then impacted into place.  Appropriate version of approximately 25 degrees and inclination 40 degrees was confirmed clinically matching their bony anatomy, and also with the use of the jig.  I placed 2 screws in the posterior superior quadrant to augment fixation.  Anterior osteophyte was present and removed with 1/2 inch curved osteotome.  A standard liner was placed and impacted. It was confirmed to be appropriately seated and the acetabular retractors were removed.    I then prepared the proximal femur using the box cutter, Charnley awl, and then sequentially broached starting with 0 up to a size 3.  A trial broach, neck, and head was utilized, and I reduced the hip and it was found to have excellent stability.  There was no impingement with full extension and 90 degrees external rotation.  The hip was stable at the position of sleep and with 90 degrees flexion and 70 degrees of internal rotation.  Leg lengths were also clinically assessed in the lateral position and felt to be equal.  A final femoral prosthesis size 3 was selected. I then impacted the real femoral prosthesis into place.I again trialed and selected a 36 + 0 ball. and I impacted the real head ball into place. The hip was then reduced and taken through a range of motion. There was no impingement with full extension and 90 degrees external rotation.  The hip was stable at the position of sleep and with 90 degrees flexion and 70 degrees of internal rotation. Leg lengths were  again assessed and felt to be restored.  The posterior capsule was then closed with #5 Ethibond.  The piriformis was repaired through the base of the abductor tendon using a [place] suture passer.  I then irrigated the hip copiously again with pulse lavage. Periarticular injection was then performed with Exparel.  Intraop flat plate xray was obtained and components were confirmed to be in good position without fracture. We  repaired the fascia #1 barbed suture, followed by 0 barbed suture for the subcutaneous fat.  Skin was closed with 2-0 Vicryl and 3-0 Monocryl.  Dermabond and Aquacel dressing were applied. The patient was then awakened and returned to PACU in stable and satisfactory condition.  Leg lengths in the supine position were assessed and felt to be clinically equal. There were no complications.  Post op recs: WB: WBAT RLE, posterior hip precautions x6 weeks Abx: ancef x23 hours post op Imaging: PACU pelvis Xray Dressing: Aquacell, keep intact until follow up DVT prophylaxis: Aspirin 81BID starting POD1 Follow up: 2 weeks after surgery for a wound check with Dr. Zachery Dakins at [facility].  Address: [address] [place], [place], [place] [address]  Office Phone: [phone]   Charlies Constable, MD Orthopedic Surgeon [phone]  08/22/2021 10:08 AM

## 2021-08-22 NOTE — Interval H&P Note (Signed)
History and Physical Interval Note:  08/22/2021 7:00 AM  Angie [name]  has presented today for surgery, with the diagnosis of OA RIGHT HIP.  The various methods of treatment have been discussed with the patient and family. After consideration of risks, benefits and other options for treatment, the patient has consented to  Procedure(s): TOTAL HIP ARTHROPLASTY (Right) as a surgical intervention.  The patient's history has been reviewed, patient examined, no change in status, stable for surgery.  I have reviewed the patient's chart and labs.  Questions were answered to the patient's satisfaction.    The patient has been re-examined, and the chart reviewed, and there have been no interval changes to the documented history and physical.    The operative side was examined and the patient was confirmed to have. Sens DPN, SPN, TN intact, Motor EHL, ext, flex 5/5, and DP 2+, PT 2+, No significant edema.\   The risks, benefits, and alternatives have been discussed at length with patient, and the patient is willing to proceed.  Right hip marked. Consent has been signed.   [place]

## 2021-08-22 NOTE — Anesthesia Procedure Notes (Signed)
Procedure Name: Intubation Date/Time: 08/22/2021 8:04 AM Performed by: Gean Maidens, CRNA Pre-anesthesia Checklist: Patient identified, Emergency Drugs available, Suction available, Patient being monitored and Timeout performed Patient Re-evaluated:Patient Re-evaluated prior to induction Oxygen Delivery Method: Circle system utilized Preoxygenation: Pre-oxygenation with 100% oxygen Induction Type: IV induction Ventilation: Mask ventilation without difficulty Laryngoscope Size: Mac and 4 Grade View: Grade I Tube type: Oral Tube size: 7.0 mm Number of attempts: 1 Airway Equipment and Method: Stylet Placement Confirmation: ETT inserted through vocal cords under direct vision, positive ETCO2 and breath sounds checked- equal and bilateral Secured at: 21 cm Tube secured with: Tape Dental Injury: Teeth and Oropharynx as per pre-operative assessment

## 2021-08-22 NOTE — Anesthesia Postprocedure Evaluation (Signed)
Anesthesia Post Note  Patient: Angie [name]  Procedure(s) Performed: TOTAL HIP ARTHROPLASTY (Right: Hip)     Patient location during evaluation: PACU Anesthesia Type: General Level of consciousness: awake and alert Pain management: pain level controlled Vital Signs Assessment: post-procedure vital signs reviewed and stable Respiratory status: spontaneous breathing, nonlabored ventilation, respiratory function stable and patient connected to nasal cannula oxygen Cardiovascular status: blood pressure returned to baseline and stable Postop Assessment: no apparent nausea or vomiting Anesthetic complications: no   No notable events documented.  Last Vitals:  Vitals:   08/22/21 1200 08/22/21 1215  BP: (!) 143/70 130/78  Pulse: 66 69  Resp: (!) 8 (!) 9  Temp: 37 C   SpO2: 95% 96%    Last Pain:  Vitals:   08/22/21 1215  TempSrc:   PainSc: 0-No pain                 Barnet Glasgow

## 2021-08-22 NOTE — Anesthesia Preprocedure Evaluation (Addendum)
Anesthesia Evaluation  Patient identified by MRN, date of birth, ID band Patient awake    Reviewed: Allergy & Precautions, NPO status , Patient's Chart, lab work & pertinent test results  Airway Mallampati: II  TM Distance: >3 FB Neck ROM: Full    Dental no notable dental hx. (+) Teeth Intact, Dental Advisory Given   Pulmonary former smoker,    Pulmonary exam normal breath sounds clear to auscultation       Cardiovascular hypertension, Pt. on medications Normal cardiovascular exam Rhythm:Regular Rate:Normal  Echo 08/01/2021 Left ventricular ejection fraction, by estimation, is 60 to 65%. The left ventricle has normal function. The left ventricle has no regional wall motion abnormalities. Left ventricular diastolic parameters are consistent with Grade I diastolic dysfunction (impaired relaxation). The average left ventricular global longitudinal strain is -20.3 %. The global longitudinal strain is normal. 1. Right ventricular systolic function is normal. The right ventricular size is normal. Tricuspid regurgitation signal is inadequate for assessing PA pressure. 2. The mitral valve is normal in structure. No evidence of mitral valve regurgitation. No   Neuro/Psych  Headaches,    GI/Hepatic GERD  ,  Endo/Other  negative endocrine ROS  Renal/GU Lab Results      Component                Value               Date                      CREATININE               0.48                08/15/2021                BUN                      16                  08/15/2021                NA                       140                 08/15/2021                K                        3.4 (L)             08/15/2021                CL                       102                 08/15/2021                CO2                      31                  08/15/2021                Musculoskeletal  (+) Arthritis ,   Abdominal (+) + obese (  BMI 37.50),    Peds  Hematology Lab Results      Component                Value               Date                      WBC                      7.1                 08/15/2021                HGB                      14.0                08/15/2021                HCT                      42.1                08/15/2021                MCV                      91.7                08/15/2021                PLT                      197                 08/15/2021              Anesthesia Other Findings   Reproductive/Obstetrics                           Anesthesia Physical Anesthesia Plan  ASA: 3  Anesthesia Plan: General   Post-op Pain Management: Dilaudid IV   Induction: Intravenous  PONV Risk Score and Plan: 4 or greater and Treatment may vary due to age or medical condition, Midazolam and Ondansetron  Airway Management Planned: Oral ETT  Additional Equipment: None  Intra-op Plan:   Post-operative Plan: Extubation in OR  Informed Consent: I have reviewed the patients History and Physical, chart, labs and discussed the procedure including the risks, benefits and alternatives for the proposed anesthesia with the patient or authorized representative who has indicated his/her understanding and acceptance.     Dental advisory given  Plan Discussed with: CRNA and Anesthesiologist  Anesthesia Plan Comments: (Spinal unsuccesful  Switch to [place] )       Anesthesia Quick Evaluation

## 2021-08-22 NOTE — Progress Notes (Signed)
PT Cancellation Note  Patient Details Name: Angie [name] MRN: [id number] DOB: November 20, 1945   Cancelled Treatment:    Reason Eval/Treat Not Completed: Medical issues which prohibited therapy, Patient has had  nausea medication and pain medication and sleeping. RN recommends to wait and see how she feels later.   Will check back another time.  [place] Pager [phone] Office [phone]    [name] 08/22/2021, 2:28 PM

## 2021-08-22 NOTE — Discharge Instructions (Signed)

## 2021-08-22 NOTE — Anesthesia Procedure Notes (Signed)
Spinal  Patient location during procedure: OR Start time: 08/22/2021 7:30 AM End time: 08/22/2021 7:40 AM Reason for block: surgical anesthesia Staffing Performed: anesthesiologist  Anesthesiologist: Barnet Glasgow, MD Preanesthetic Checklist Completed: patient identified, IV checked, risks and benefits discussed, surgical consent, monitors and equipment checked, pre-op evaluation and timeout performed Spinal Block Patient position: sitting Prep: DuraPrep and site prepped and draped Patient monitoring: heart rate, cardiac monitor, continuous pulse ox and blood pressure Approach: midline Location: L3-4 Injection technique: single-shot Needle Needle type: Pencan  Needle gauge: 24 G Needle length: 10 cm Assessment Sensory level: T4 Events: CSF return Additional Notes  2 Attempt (s) L3-4 Unsucessful. Switch to GA Pt tolerated procedure well.

## 2021-08-22 NOTE — Transfer of Care (Signed)
Immediate Anesthesia Transfer of Care Note  Patient: Angie [name]  Procedure(s) Performed: TOTAL HIP ARTHROPLASTY (Right: Hip)  Patient Location: PACU  Anesthesia Type:General  Level of Consciousness: sedated, patient cooperative and responds to stimulation  Airway & Oxygen Therapy: Patient Spontanous Breathing and Patient connected to face mask oxygen  Post-op Assessment: Report given to RN and Post -op Vital signs reviewed and stable  Post vital signs: Reviewed and stable  Last Vitals:  Vitals Value Taken Time  BP 142/68 08/22/21 1033  Temp    Pulse 66 08/22/21 1036  Resp 15 08/22/21 1036  SpO2 100 % 08/22/21 1036  Vitals shown include unvalidated device data.  Last Pain:  Vitals:   08/22/21 0622  TempSrc:   PainSc: 0-No pain         Complications: No notable events documented.

## 2021-08-23 ENCOUNTER — Encounter (HOSPITAL_COMMUNITY): Payer: Self-pay | Admitting: Orthopedic Surgery

## 2021-08-23 DIAGNOSIS — M1611 Unilateral primary osteoarthritis, right hip: Secondary | ICD-10-CM | POA: Diagnosis not present

## 2021-08-23 DIAGNOSIS — Z6837 Body mass index (BMI) 37.0-37.9, adult: Secondary | ICD-10-CM | POA: Diagnosis not present

## 2021-08-23 DIAGNOSIS — Z87891 Personal history of nicotine dependence: Secondary | ICD-10-CM | POA: Diagnosis not present

## 2021-08-23 DIAGNOSIS — E669 Obesity, unspecified: Secondary | ICD-10-CM | POA: Diagnosis not present

## 2021-08-23 DIAGNOSIS — K219 Gastro-esophageal reflux disease without esophagitis: Secondary | ICD-10-CM | POA: Diagnosis not present

## 2021-08-23 DIAGNOSIS — I10 Essential (primary) hypertension: Secondary | ICD-10-CM | POA: Diagnosis not present

## 2021-08-23 LAB — CBC
HCT: 35.3 % — ABNORMAL LOW (ref 36.0–46.0)
Hemoglobin: 12 g/dL (ref 12.0–15.0)
MCH: 30.7 pg (ref 26.0–34.0)
MCHC: 34 g/dL (ref 30.0–36.0)
MCV: 90.3 fL (ref 80.0–100.0)
Platelets: 173 10*3/uL (ref 150–400)
RBC: 3.91 MIL/uL (ref 3.87–5.11)
RDW: 13.3 % (ref 11.5–15.5)
WBC: 14.1 10*3/uL — ABNORMAL HIGH (ref 4.0–10.5)
nRBC: 0 % (ref 0.0–0.2)

## 2021-08-23 LAB — BASIC METABOLIC PANEL
Anion gap: 8 (ref 5–15)
BUN: 12 mg/dL (ref 8–23)
CO2: 28 mmol/L (ref 22–32)
Calcium: 9.2 mg/dL (ref 8.9–10.3)
Chloride: 101 mmol/L (ref 98–111)
Creatinine, Ser: 0.63 mg/dL (ref 0.44–1.00)
GFR, Estimated: >60 mL/min (ref >60)
Glucose, Bld: 110 mg/dL — ABNORMAL HIGH (ref 70–99)
Potassium: 3.6 mmol/L (ref 3.5–5.1)
Sodium: 137 mmol/L (ref 135–145)

## 2021-08-23 MED ORDER — ACETAMINOPHEN 500 MG PO TABS: 1000.0000 mg | ORAL_TABLET | Freq: Three times a day (TID) | ORAL | 0 refills | Status: AC | PRN

## 2021-08-23 MED ORDER — TRAMADOL HCL 50 MG PO TABS: 50.0000 mg | ORAL_TABLET | Freq: Four times a day (QID) | ORAL | 0 refills | Status: AC | PRN

## 2021-08-23 MED ORDER — ONDANSETRON HCL 4 MG PO TABS: 4.0000 mg | ORAL_TABLET | Freq: Three times a day (TID) | ORAL | 0 refills | Status: AC | PRN

## 2021-08-23 MED ORDER — ASPIRIN EC 81 MG PO TBEC: 81.0000 mg | DELAYED_RELEASE_TABLET | Freq: Two times a day (BID) | ORAL | 0 refills | Status: AC

## 2021-08-23 NOTE — TOC Transition Note (Signed)
Transition of Care [facility]) - CM/SW Discharge Note   Patient Details  Name: Angie [name] MRN: [id number] Date of Birth: 04-12-1946  [facility] CM/SW Contact:  Lennart Pall, LCSW Phone Number: 08/23/2021, 1:02 PM   Clinical Narrative:    Met with pt and confirming she has all needed DME.  HHPT prearranged with [facility].  No TOC needs.   Final next level of care: [place] Barriers to Discharge: No Barriers Identified   Patient Goals and CMS Choice Patient states their goals for this hospitalization and ongoing recovery are:: return home      Discharge Placement                       Discharge Plan and Services                DME Arranged: N/A DME Agency: NA       HH Arranged: PT [place] Agency: [place]        Social Determinants of Health (SDOH) Interventions     Readmission Risk Interventions No flowsheet data found.

## 2021-08-23 NOTE — Progress Notes (Signed)
Subjective: ° °Patient reports pain as  well controlled. Slept well overnight. No acute issues. Did not mobilize or work with PT POD0, but she is eager to get up today and hopeful to go home. Denies distal n/t. ° °Objective:  ° °VITALS:   °Vitals:  ° 08/22/21 1430 08/22/21 1443 08/22/21 2038 08/23/21 0234  °BP: (!) 108/92 118/76 140/63 129/65  °Pulse: 66 87 78 70  °Resp: 16 14 18 18  °Temp:   98.1 °F (36.7 °C) 98.3 °F (36.8 °C)  °TempSrc:   Oral Oral  °SpO2: 94% 97% 100% 97%  °Weight:      °Height:      ° ° °Sensation intact distally °Intact pulses distally °Dorsiflexion/Plantar flexion intact °Incision: dressing C/D/I °Compartment soft ° ° °Lab Results  °Component Value Date  ° WBC 14.1 (H) 08/23/2021  ° HGB 12.0 08/23/2021  ° HCT 35.3 (L) 08/23/2021  ° MCV 90.3 08/23/2021  ° PLT 173 08/23/2021  ° °BMET °   °Component Value Date/Time  ° NA 137 08/23/2021 0328  ° NA 142 07/27/2021 1120  ° K 3.6 08/23/2021 0328  ° CL 101 08/23/2021 0328  ° CO2 28 08/23/2021 0328  ° GLUCOSE 110 (H) 08/23/2021 0328  ° BUN 12 08/23/2021 0328  ° BUN 20 07/27/2021 1120  ° CREATININE 0.63 08/23/2021 0328  ° CREATININE 0.72 09/08/2014 1142  ° CALCIUM 9.2 08/23/2021 0328  ° EGFR 86 07/27/2021 1120  ° GFRNONAA >60 08/23/2021 0328  ° ° ° ° °Xray: THA components in good position without adverse features, no fx or dislocation ° °Assessment/Plan: °1 Day Post-Op  ° °Principal Problem: °  Osteoarthritis of right hip, unspecified osteoarthritis type ° °S/p R THA 1/30 ° °Post op recs: °WB: WBAT RLE, posterior hip precautions x6 weeks °Abx: ancef x23 hours post op °Imaging: PACU pelvis Xray °Dressing: Aquacell, keep intact until follow up °DVT prophylaxis: Aspirin 81BID starting POD1 °Follow up: 2 weeks after surgery for a wound check with Dr.  at [facility].  °Address: [address], [place], [place] [address]  °Office Phone: [phone] ° ° ° ° A  °08/23/2021, 6:26 AM ° ° ° ,  MD ° °Contact information:   °Weekdays 7am-5pm epic message Dr. , or call office for patient follow up: [phone] °After hours and holidays please check [email or web address] for group call information for [facility] ° °  °

## 2021-08-23 NOTE — Evaluation (Addendum)
Physical Therapy Evaluation Patient Details Name: Angie [name] MRN: [id number] DOB: 21-Jul-1946 Today's Date: 08/23/2021  History of Present Illness  Pt is a 76 year old femle s/p right THA posterior approach on 08/22/21.  Clinical Impression  Pt is s/p THA resulting in the deficits listed below (see PT Problem List). Pt will benefit from skilled PT to increase their independence and safety with mobility to allow discharge to the venue listed below.  Pt educated on posterior hip precautions.  Pt assisted with ambulating short distance POD #1 and limited by fatigue and pain.  Pt will need to practice steps prior to d/c home.      Recommendations for follow up therapy are one component of a multi-disciplinary discharge planning process, led by the attending physician.  Recommendations may be updated based on patient status, additional functional criteria and insurance authorization.  Follow Up Recommendations Follow physician's recommendations for discharge plan and follow up therapies (plans for HHPT)    Assistance Recommended at Discharge Intermittent Supervision/Assistance  Patient can return home with the following  A little help with walking and/or transfers;A little help with bathing/dressing/bathroom;Help with stairs or ramp for entrance    Equipment Recommendations None recommended by PT  Recommendations for Other Services       Functional Status Assessment Patient has had a recent decline in their functional status and demonstrates the ability to make significant improvements in function in a reasonable and predictable amount of time.     Precautions / Restrictions Precautions Precautions: Fall;Posterior Hip Restrictions Weight Bearing Restrictions: No Other Position/Activity Restrictions: WBAT      Mobility  Bed Mobility Overal bed mobility: Needs Assistance Bed Mobility: Supine to Sit     Supine to sit: Min assist, HOB elevated     General bed mobility  comments: verbal cues for self assist however pt in pain so requested assist for Rt LE    Transfers Overall transfer level: Needs assistance Equipment used: Rolling walker (2 wheels) Transfers: Sit to/from Stand Sit to Stand: Min assist           General transfer comment: verbal cues for UE and LE positioning, assist to rise and steady    Ambulation/Gait Ambulation/Gait assistance: Min guard Gait Distance (Feet): 50 Feet Assistive device: Rolling walker (2 wheels) Gait Pattern/deviations: Step-to pattern, Trunk flexed, Decreased stance time - right, Antalgic       General Gait Details: verbal cues for sequence, RW positioning, step length, posture, increased time and effort  Stairs            Wheelchair Mobility    Modified Rankin (Stroke Patients Only)       Balance                                             Pertinent Vitals/Pain Pain Assessment Pain Assessment: 0-10 Pain Score: 6  Pain Location: right hip Pain Descriptors / Indicators: Sore, Aching Pain Intervention(s): Monitored during session, Repositioned, Ice applied    Home Living Family/patient expects to be discharged to:: Private residence Living Arrangements: Spouse/significant other   Type of Home: House Home Access: Stairs to enter Entrance Stairs-Rails: Psychiatric nurse of Steps: 5   Home Layout: Able to live on main level with bedroom/bathroom Home Equipment: Conservation officer, nature (2 wheels);BSC/3in1      Prior Function Prior Level of Function : Independent/Modified Independent  Hand Dominance        Extremity/Trunk Assessment        Lower Extremity Assessment Lower Extremity Assessment: RLE deficits/detail RLE Deficits / Details: anticipated post op hip weakness, pt self assisting Rt LE RLE: Unable to fully assess due to pain       Communication   Communication: No difficulties  Cognition Arousal/Alertness:  Awake/alert Behavior During Therapy: WFL for tasks assessed/performed Overall Cognitive Status: Within Functional Limits for tasks assessed                                          General Comments      Exercises     Assessment/Plan    PT Assessment Patient needs continued PT services  PT Problem List Decreased range of motion;Decreased strength;Pain;Decreased activity tolerance;Decreased balance;Decreased mobility;Decreased knowledge of precautions;Decreased knowledge of use of DME       PT Treatment Interventions Stair training;Gait training;Balance training;DME instruction;Therapeutic exercise;Therapeutic activities;Functional mobility training;Patient/family education    PT Goals (Current goals can be found in the Care Plan section)  Acute Rehab PT Goals PT Goal Formulation: With patient Time For Goal Achievement: 08/30/21 Potential to Achieve Goals: Good    Frequency 7X/week     Co-evaluation               AM-PAC PT "6 Clicks" Mobility  Outcome Measure Help needed turning from your back to your side while in a flat bed without using bedrails?: A Little Help needed moving from lying on your back to sitting on the side of a flat bed without using bedrails?: A Little Help needed moving to and from a bed to a chair (including a wheelchair)?: A Little Help needed standing up from a chair using your arms (e.g., wheelchair or bedside chair)?: A Little Help needed to walk in hospital room?: A Little Help needed climbing 3-5 steps with a railing? : A Lot 6 Click Score: 17    End of Session Equipment Utilized During Treatment: Gait belt Activity Tolerance: Patient tolerated treatment well Patient left: in chair;with call bell/phone within reach;with chair alarm set Nurse Communication: Mobility status PT Visit Diagnosis: Other abnormalities of gait and mobility (R26.89)    Time: 0935-1010 PT Time Calculation (min) (ACUTE ONLY): 35  min   Charges:   PT Evaluation $PT Eval Low Complexity: 1 Low PT Treatments $Gait Training: 8-22 mins      Jannette Spanner PT, DPT Acute Rehabilitation Services Pager: [phone] Office: [place] 08/23/2021, 12:42 PM

## 2021-08-23 NOTE — Progress Notes (Signed)
Physical Therapy Treatment Patient Details Name: Angie [name] MRN: [id number] DOB: 12-Dec-1945 Today's Date: 08/23/2021   History of Present Illness Pt is a 76 year old femle s/p right THA posterior approach on 08/22/21.    PT Comments    Pt ambulated in hallway and practiced steps with spouse.  Pt feels ready for d/c home today.  Pt plans to f/u with HHPT however provided and verbally reviewed exercise handout (also mentioning maintaining precautions). Pt also able to verbalize posterior hip precautions and provided with handout on precautions.  Pt reports understanding and feels ready to d/c home today.    Recommendations for follow up therapy are one component of a multi-disciplinary discharge planning process, led by the attending physician.  Recommendations may be updated based on patient status, additional functional criteria and insurance authorization.  Follow Up Recommendations  Follow physician's recommendations for discharge plan and follow up therapies (plans to f/u with HHPT)     Assistance Recommended at Discharge Intermittent Supervision/Assistance  Patient can return home with the following A little help with walking and/or transfers;A little help with bathing/dressing/bathroom;Help with stairs or ramp for entrance   Equipment Recommendations  None recommended by PT    Recommendations for Other Services       Precautions / Restrictions Precautions Precautions: Fall;Posterior Hip Precaution Comments: provided handout and reviewed posterior hip precautions Restrictions Weight Bearing Restrictions: No Other Position/Activity Restrictions: WBAT     Mobility  Bed Mobility Overal bed mobility: Needs Assistance Bed Mobility: Supine to Sit     Supine to sit: Min assist, HOB elevated     General bed mobility comments: pt in recliner    Transfers Overall transfer level: Needs assistance Equipment used: Rolling walker (2 wheels) Transfers: Sit to/from  Stand Sit to Stand: Min guard, Supervision           General transfer comment: verbal cues for UE and LE positioning    Ambulation/Gait Ambulation/Gait assistance: Min guard Gait Distance (Feet): 80 Feet Assistive device: Rolling walker (2 wheels) Gait Pattern/deviations: Step-to pattern, Trunk flexed, Decreased stance time - right, Antalgic       General Gait Details: verbal cues for sequence, RW positioning, step length, posture, improved cadence this afternoon; pt also reports pain better   Stairs Stairs: Yes Stairs assistance: Min guard Stair Management: Forwards, Step to pattern, One rail Left Number of Stairs: 2 General stair comments: verbal cues for safety and sequence; provided a hand for pt to self support upon descending; performed once more with spouse assisting and providing support; pt and spouse both feel comfortable with performance and had no questions   Wheelchair Mobility    Modified Rankin (Stroke Patients Only)       Balance                                            Cognition Arousal/Alertness: Awake/alert Behavior During Therapy: WFL for tasks assessed/performed Overall Cognitive Status: Within Functional Limits for tasks assessed                                          Exercises      General Comments        Pertinent Vitals/Pain Pain Assessment Pain Assessment: 0-10 Pain Score: 5  Pain Location: right hip  Pain Descriptors / Indicators: Sore, Aching Pain Intervention(s): Repositioned, Monitored during session, Ice applied    Home Living Family/patient expects to be discharged to:: Private residence Living Arrangements: Spouse/significant other   Type of Home: House Home Access: Stairs to enter Entrance Stairs-Rails: Psychiatric nurse of Steps: 5   Home Layout: Able to live on main level with bedroom/bathroom Home Equipment: Conservation officer, nature (2 wheels);BSC/3in1      Prior  Function            PT Goals (current goals can now be found in the care plan section) Acute Rehab PT Goals PT Goal Formulation: With patient Time For Goal Achievement: 08/30/21 Potential to Achieve Goals: Good Progress towards PT goals: Progressing toward goals    Frequency    7X/week      PT Plan Current plan remains appropriate    Co-evaluation              AM-PAC PT "6 Clicks" Mobility   Outcome Measure  Help needed turning from your back to your side while in a flat bed without using bedrails?: A Little Help needed moving from lying on your back to sitting on the side of a flat bed without using bedrails?: A Little Help needed moving to and from a bed to a chair (including a wheelchair)?: A Little Help needed standing up from a chair using your arms (e.g., wheelchair or bedside chair)?: A Little Help needed to walk in hospital room?: A Little Help needed climbing 3-5 steps with a railing? : A Little 6 Click Score: 18    End of Session Equipment Utilized During Treatment: Gait belt Activity Tolerance: Patient tolerated treatment well Patient left: in chair;with call bell/phone within reach Nurse Communication: Mobility status PT Visit Diagnosis: Other abnormalities of gait and mobility (R26.89)     Time: 1425-1445 PT Time Calculation (min) (ACUTE ONLY): 20 min  Charges:  $Gait Training: 8-22 mins                    Arlyce Dice, DPT Acute Rehabilitation Services Pager: [phone] Office: [place] 08/23/2021, 2:58 PM

## 2021-08-24 DIAGNOSIS — K5900 Constipation, unspecified: Secondary | ICD-10-CM | POA: Diagnosis not present

## 2021-08-24 DIAGNOSIS — G43909 Migraine, unspecified, not intractable, without status migrainosus: Secondary | ICD-10-CM | POA: Diagnosis not present

## 2021-08-24 DIAGNOSIS — I10 Essential (primary) hypertension: Secondary | ICD-10-CM | POA: Diagnosis not present

## 2021-08-24 DIAGNOSIS — R7303 Prediabetes: Secondary | ICD-10-CM | POA: Diagnosis not present

## 2021-08-24 DIAGNOSIS — Z7982 Long term (current) use of aspirin: Secondary | ICD-10-CM | POA: Diagnosis not present

## 2021-08-24 DIAGNOSIS — Z981 Arthrodesis status: Secondary | ICD-10-CM | POA: Diagnosis not present

## 2021-08-24 DIAGNOSIS — Z87891 Personal history of nicotine dependence: Secondary | ICD-10-CM | POA: Diagnosis not present

## 2021-08-24 DIAGNOSIS — E785 Hyperlipidemia, unspecified: Secondary | ICD-10-CM | POA: Diagnosis not present

## 2021-08-24 DIAGNOSIS — K219 Gastro-esophageal reflux disease without esophagitis: Secondary | ICD-10-CM | POA: Diagnosis not present

## 2021-08-24 DIAGNOSIS — Z96641 Presence of right artificial hip joint: Secondary | ICD-10-CM | POA: Diagnosis not present

## 2021-08-24 DIAGNOSIS — Z471 Aftercare following joint replacement surgery: Secondary | ICD-10-CM | POA: Diagnosis not present

## 2021-08-26 DIAGNOSIS — R7303 Prediabetes: Secondary | ICD-10-CM | POA: Diagnosis not present

## 2021-08-26 DIAGNOSIS — Z471 Aftercare following joint replacement surgery: Secondary | ICD-10-CM | POA: Diagnosis not present

## 2021-08-26 DIAGNOSIS — G43909 Migraine, unspecified, not intractable, without status migrainosus: Secondary | ICD-10-CM | POA: Diagnosis not present

## 2021-08-26 DIAGNOSIS — E785 Hyperlipidemia, unspecified: Secondary | ICD-10-CM | POA: Diagnosis not present

## 2021-08-26 DIAGNOSIS — I10 Essential (primary) hypertension: Secondary | ICD-10-CM | POA: Diagnosis not present

## 2021-08-26 DIAGNOSIS — K219 Gastro-esophageal reflux disease without esophagitis: Secondary | ICD-10-CM | POA: Diagnosis not present

## 2021-08-29 ENCOUNTER — Other Ambulatory Visit (HOSPITAL_COMMUNITY): Payer: Medicare Other

## 2021-08-29 DIAGNOSIS — E785 Hyperlipidemia, unspecified: Secondary | ICD-10-CM | POA: Diagnosis not present

## 2021-08-29 DIAGNOSIS — K219 Gastro-esophageal reflux disease without esophagitis: Secondary | ICD-10-CM | POA: Diagnosis not present

## 2021-08-29 DIAGNOSIS — R7303 Prediabetes: Secondary | ICD-10-CM | POA: Diagnosis not present

## 2021-08-29 DIAGNOSIS — Z471 Aftercare following joint replacement surgery: Secondary | ICD-10-CM | POA: Diagnosis not present

## 2021-08-29 DIAGNOSIS — G43909 Migraine, unspecified, not intractable, without status migrainosus: Secondary | ICD-10-CM | POA: Diagnosis not present

## 2021-08-29 DIAGNOSIS — I10 Essential (primary) hypertension: Secondary | ICD-10-CM | POA: Diagnosis not present

## 2021-08-31 DIAGNOSIS — Z471 Aftercare following joint replacement surgery: Secondary | ICD-10-CM | POA: Diagnosis not present

## 2021-08-31 DIAGNOSIS — I10 Essential (primary) hypertension: Secondary | ICD-10-CM | POA: Diagnosis not present

## 2021-08-31 DIAGNOSIS — G43909 Migraine, unspecified, not intractable, without status migrainosus: Secondary | ICD-10-CM | POA: Diagnosis not present

## 2021-08-31 DIAGNOSIS — K219 Gastro-esophageal reflux disease without esophagitis: Secondary | ICD-10-CM | POA: Diagnosis not present

## 2021-08-31 DIAGNOSIS — R7303 Prediabetes: Secondary | ICD-10-CM | POA: Diagnosis not present

## 2021-08-31 DIAGNOSIS — E785 Hyperlipidemia, unspecified: Secondary | ICD-10-CM | POA: Diagnosis not present

## 2021-09-05 DIAGNOSIS — K219 Gastro-esophageal reflux disease without esophagitis: Secondary | ICD-10-CM | POA: Diagnosis not present

## 2021-09-05 DIAGNOSIS — E785 Hyperlipidemia, unspecified: Secondary | ICD-10-CM | POA: Diagnosis not present

## 2021-09-05 DIAGNOSIS — Z471 Aftercare following joint replacement surgery: Secondary | ICD-10-CM | POA: Diagnosis not present

## 2021-09-05 DIAGNOSIS — G43909 Migraine, unspecified, not intractable, without status migrainosus: Secondary | ICD-10-CM | POA: Diagnosis not present

## 2021-09-05 DIAGNOSIS — I10 Essential (primary) hypertension: Secondary | ICD-10-CM | POA: Diagnosis not present

## 2021-09-05 DIAGNOSIS — R7303 Prediabetes: Secondary | ICD-10-CM | POA: Diagnosis not present

## 2021-09-06 DIAGNOSIS — M1611 Unilateral primary osteoarthritis, right hip: Secondary | ICD-10-CM | POA: Diagnosis not present

## 2021-09-07 DIAGNOSIS — G43909 Migraine, unspecified, not intractable, without status migrainosus: Secondary | ICD-10-CM | POA: Diagnosis not present

## 2021-09-07 DIAGNOSIS — E785 Hyperlipidemia, unspecified: Secondary | ICD-10-CM | POA: Diagnosis not present

## 2021-09-07 DIAGNOSIS — R7303 Prediabetes: Secondary | ICD-10-CM | POA: Diagnosis not present

## 2021-09-07 DIAGNOSIS — I10 Essential (primary) hypertension: Secondary | ICD-10-CM | POA: Diagnosis not present

## 2021-09-07 DIAGNOSIS — K219 Gastro-esophageal reflux disease without esophagitis: Secondary | ICD-10-CM | POA: Diagnosis not present

## 2021-09-07 DIAGNOSIS — Z471 Aftercare following joint replacement surgery: Secondary | ICD-10-CM | POA: Diagnosis not present

## 2021-09-08 DIAGNOSIS — M1611 Unilateral primary osteoarthritis, right hip: Secondary | ICD-10-CM | POA: Diagnosis not present

## 2021-09-14 DIAGNOSIS — M1611 Unilateral primary osteoarthritis, right hip: Secondary | ICD-10-CM | POA: Diagnosis not present

## 2021-09-14 DIAGNOSIS — R262 Difficulty in walking, not elsewhere classified: Secondary | ICD-10-CM | POA: Diagnosis not present

## 2021-09-14 DIAGNOSIS — M6281 Muscle weakness (generalized): Secondary | ICD-10-CM | POA: Diagnosis not present

## 2021-09-14 DIAGNOSIS — M25651 Stiffness of right hip, not elsewhere classified: Secondary | ICD-10-CM | POA: Diagnosis not present

## 2021-09-16 DIAGNOSIS — H35363 Drusen (degenerative) of macula, bilateral: Secondary | ICD-10-CM | POA: Diagnosis not present

## 2021-09-16 DIAGNOSIS — H2513 Age-related nuclear cataract, bilateral: Secondary | ICD-10-CM | POA: Diagnosis not present

## 2021-09-16 DIAGNOSIS — T1512XA Foreign body in conjunctival sac, left eye, initial encounter: Secondary | ICD-10-CM | POA: Diagnosis not present

## 2021-09-16 DIAGNOSIS — H04123 Dry eye syndrome of bilateral lacrimal glands: Secondary | ICD-10-CM | POA: Diagnosis not present

## 2021-09-19 DIAGNOSIS — M25651 Stiffness of right hip, not elsewhere classified: Secondary | ICD-10-CM | POA: Diagnosis not present

## 2021-09-19 DIAGNOSIS — M1611 Unilateral primary osteoarthritis, right hip: Secondary | ICD-10-CM | POA: Diagnosis not present

## 2021-09-19 DIAGNOSIS — M6281 Muscle weakness (generalized): Secondary | ICD-10-CM | POA: Diagnosis not present

## 2021-09-19 DIAGNOSIS — R262 Difficulty in walking, not elsewhere classified: Secondary | ICD-10-CM | POA: Diagnosis not present

## 2021-09-23 DIAGNOSIS — R262 Difficulty in walking, not elsewhere classified: Secondary | ICD-10-CM | POA: Diagnosis not present

## 2021-09-23 DIAGNOSIS — M1611 Unilateral primary osteoarthritis, right hip: Secondary | ICD-10-CM | POA: Diagnosis not present

## 2021-09-23 DIAGNOSIS — M6281 Muscle weakness (generalized): Secondary | ICD-10-CM | POA: Diagnosis not present

## 2021-09-23 DIAGNOSIS — M25651 Stiffness of right hip, not elsewhere classified: Secondary | ICD-10-CM | POA: Diagnosis not present

## 2021-09-26 DIAGNOSIS — M6281 Muscle weakness (generalized): Secondary | ICD-10-CM | POA: Diagnosis not present

## 2021-09-26 DIAGNOSIS — R262 Difficulty in walking, not elsewhere classified: Secondary | ICD-10-CM | POA: Diagnosis not present

## 2021-09-26 DIAGNOSIS — M1611 Unilateral primary osteoarthritis, right hip: Secondary | ICD-10-CM | POA: Diagnosis not present

## 2021-09-26 DIAGNOSIS — M25651 Stiffness of right hip, not elsewhere classified: Secondary | ICD-10-CM | POA: Diagnosis not present

## 2021-09-28 ENCOUNTER — Telehealth: Payer: Self-pay

## 2021-09-28 NOTE — Telephone Encounter (Signed)
Patient calls nurse line requesting rx refill on meclizine. Patient reports that she has taken this in the past for flare ups with vertigo.  ? ?This is not on current medication list.  ? ?If appropriate, please send refill to [facility] in [place].  ? ?Talbot Grumbling, RN ? ?

## 2021-09-28 NOTE — Telephone Encounter (Signed)
Hello [facility] blue CMA, ? ?Please advise the patient to avoid meclizine due to its highly anticholinergic effects, sedation, weakness, blood pressure changes, dry mouth, and urinary retention, which worsens in elderly greater than 65. ? ?I will place a referral to PT instead for vestibular rehab. Please, confirm with her that she wants to be referred. ?Otherwise, please schedule an appointment for her to see me soon. ?

## 2021-09-29 DIAGNOSIS — M1611 Unilateral primary osteoarthritis, right hip: Secondary | ICD-10-CM | POA: Diagnosis not present

## 2021-09-29 DIAGNOSIS — R262 Difficulty in walking, not elsewhere classified: Secondary | ICD-10-CM | POA: Diagnosis not present

## 2021-09-29 DIAGNOSIS — M6281 Muscle weakness (generalized): Secondary | ICD-10-CM | POA: Diagnosis not present

## 2021-09-29 DIAGNOSIS — M25651 Stiffness of right hip, not elsewhere classified: Secondary | ICD-10-CM | POA: Diagnosis not present

## 2021-09-29 NOTE — Telephone Encounter (Signed)
Attempted to reach patient. No answer. LVM of note below . Salvatore Marvel, CMA ? ?

## 2021-10-06 DIAGNOSIS — M1611 Unilateral primary osteoarthritis, right hip: Secondary | ICD-10-CM | POA: Diagnosis not present

## 2021-11-22 DIAGNOSIS — M1611 Unilateral primary osteoarthritis, right hip: Secondary | ICD-10-CM | POA: Diagnosis not present

## 2021-11-22 DIAGNOSIS — M25561 Pain in right knee: Secondary | ICD-10-CM | POA: Diagnosis not present

## 2021-12-27 ENCOUNTER — Encounter: Payer: Self-pay | Admitting: *Deleted

## 2022-01-03 ENCOUNTER — Encounter: Payer: Self-pay | Admitting: Family Medicine

## 2022-01-03 ENCOUNTER — Telehealth: Payer: Self-pay | Admitting: Family Medicine

## 2022-01-03 ENCOUNTER — Ambulatory Visit (INDEPENDENT_AMBULATORY_CARE_PROVIDER_SITE_OTHER): Payer: Medicare Other | Admitting: Family Medicine

## 2022-01-03 ENCOUNTER — Other Ambulatory Visit (INDEPENDENT_AMBULATORY_CARE_PROVIDER_SITE_OTHER): Payer: Medicare Other

## 2022-01-03 ENCOUNTER — Other Ambulatory Visit: Payer: Self-pay | Admitting: Family Medicine

## 2022-01-03 VITALS — BP 140/58 | HR 76 | Ht 62.0 in | Wt 202.4 lb

## 2022-01-03 DIAGNOSIS — R3911 Hesitancy of micturition: Secondary | ICD-10-CM

## 2022-01-03 DIAGNOSIS — R339 Retention of urine, unspecified: Secondary | ICD-10-CM

## 2022-01-03 DIAGNOSIS — Z0000 Encounter for general adult medical examination without abnormal findings: Secondary | ICD-10-CM

## 2022-01-03 DIAGNOSIS — R7309 Other abnormal glucose: Secondary | ICD-10-CM | POA: Diagnosis not present

## 2022-01-03 LAB — POCT URINALYSIS DIP (MANUAL ENTRY)
Bilirubin, UA: NEGATIVE
Blood, UA: NEGATIVE
Glucose, UA: NEGATIVE mg/dL
Ketones, POC UA: NEGATIVE mg/dL
Leukocytes, UA: NEGATIVE
Nitrite, UA: NEGATIVE
Protein Ur, POC: NEGATIVE mg/dL
Spec Grav, UA: 1.01 (ref 1.010–1.025)
Urobilinogen, UA: 0.2 E.U./dL
pH, UA: 6.5 (ref 5.0–8.0)

## 2022-01-03 LAB — POCT GLYCOSYLATED HEMOGLOBIN (HGB A1C): HbA1c, POC (controlled diabetic range): 6 % (ref 0.0–7.0)

## 2022-01-03 MED ORDER — TIZANIDINE HCL 4 MG PO TABS: 4.0000 mg | ORAL_TABLET | Freq: Every evening | ORAL | 0 refills | Status: DC | PRN

## 2022-01-03 MED ORDER — SUMATRIPTAN SUCCINATE 25 MG PO TABS: ORAL_TABLET | ORAL | 2 refills | Status: DC

## 2022-01-03 MED ORDER — PRAVASTATIN SODIUM 40 MG PO TABS: 40.0000 mg | ORAL_TABLET | Freq: Every day | ORAL | 1 refills | Status: DC

## 2022-01-03 MED ORDER — PREGABALIN 100 MG PO CAPS: 100.0000 mg | ORAL_CAPSULE | Freq: Two times a day (BID) | ORAL | 1 refills | Status: DC

## 2022-01-03 MED ORDER — HYDROCHLOROTHIAZIDE 25 MG PO TABS
25.0000 mg | ORAL_TABLET | Freq: Every day | ORAL | 1 refills | Status: DC
Start: 2022-01-03 — End: 2022-11-29

## 2022-01-03 MED ORDER — VALACYCLOVIR HCL 500 MG PO TABS: 500.0000 mg | ORAL_TABLET | Freq: Every day | ORAL | 1 refills | Status: DC

## 2022-01-03 MED ORDER — FAMOTIDINE 40 MG PO TABS: 40.0000 mg | ORAL_TABLET | Freq: Every day | ORAL | 1 refills | Status: DC

## 2022-01-03 MED ORDER — DICLOFENAC SODIUM 1 % EX GEL: 2.0000 g | Freq: Two times a day (BID) | CUTANEOUS | 1 refills | Status: AC | PRN

## 2022-01-03 NOTE — Assessment & Plan Note (Signed)
She lost 2 lbs since her last visit. Diet and exercise counseling provided.

## 2022-01-03 NOTE — Progress Notes (Signed)
Post visit phone call - she requested medication refills. I discussed tapering her Pepcid to 40 daily instead of BID. She stated that her GI specialist recommended BID a long time ago. Upon record review, no major indication for high dose Pepcid at the time and she is ok to trial a lower dose. Dose adjusted. We will titrate down further in the future.

## 2022-01-03 NOTE — Progress Notes (Signed)
Subjective:     Angie [name] is a 76 y.o. female and is here for a comprehensive physical exam. The patient reports Urine concern. Incompletely bladder emptying x 1 year. She endorses associated urgency, denies incontinence. No dysuria, polyphagia.  Social History   Socioeconomic History   Marital status: Married    Spouse name: Not on file   Number of children: Not on file   Years of education: Not on file   Highest education level: Not on file  Occupational History   Not on file  Tobacco Use   Smoking status: Former    Packs/day: 0.50    Years: 30.00    Total pack years: 15.00    Types: Cigarettes    Quit date: 07/24/1993    Years since quitting: 28.4   Smokeless tobacco: Never  Vaping Use   Vaping Use: Never used  Substance and Sexual Activity   Alcohol use: No   Drug use: No   Sexual activity: Not Currently  Other Topics Concern   Not on file  Social History Narrative   Current Social History 04/25/2017            Patient lives with husband in one level home 04/25/2017   Transportation: Patient has own vehicle  04/25/2017   Important Relationships God, husband and children 04/25/2017    Pets: None 04/25/2017   Education / Work:  BSN/ Retired Therapist, 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 This is an Surveyor, minerals but also can be a burden.  04/25/2017   L. Silvano Rusk, RN, BSN                                                                                                 Social Determinants of Health   Financial Resource Strain: Not on file  Food Insecurity: Not on file  Transportation Needs: Not on file  Physical Activity: Sufficiently Active (08/07/2019)   Exercise Vital Sign    Days of Exercise per  Week: 5 days    Minutes of Exercise per Session: 60 min  Stress: Not on file  Social Connections: Not on file  Intimate Partner Violence: Not on file   Health Maintenance  Topic Date Due   Zoster Vaccines- Shingrix (1 of 2) Never done   COLONOSCOPY (Pts 45-20yr Insurance coverage will need to be confirmed)  07/12/2021   COVID-19 Vaccine (6 - Pfizer series) 11/24/2021   INFLUENZA VACCINE  02/21/2022   MAMMOGRAM  05/05/2023   TETANUS/TDAP  03/17/2031   Pneumonia Vaccine 75 Years old  Completed   DEXA SCAN  Completed   Hepatitis C Screening  Completed   HPV VACCINES  Aged Out    The following portions of the patient's history were reviewed and updated as appropriate: allergies, current medications, past family history, past medical history, past social history,  past surgical history, and problem list.  Review of Systems Pertinent items noted in HPI and remainder of comprehensive ROS otherwise negative.   Objective:    BP (!) 140/58   Pulse 76   Ht '5\' 2"'$  (1.575 m)   Wt 202 lb 6.4 oz (91.8 kg)   SpO2 94%   BMI 37.02 kg/m  General appearance: alert and cooperative Head: Normocephalic, without obvious abnormality, atraumatic Eyes: conjunctivae/corneas clear. PERRL, EOM's intact. Fundi benign. Ears: normal TM's and external ear canals both ears Throat: lips, mucosa, and tongue normal; teeth and gums normal Neck: no adenopathy, no carotid bruit, no JVD, supple, symmetrical, trachea midline, and thyroid not enlarged, symmetric, no tenderness/mass/nodules Lungs: clear to auscultation bilaterally Heart: regular rate and rhythm, S1, S2 normal, no murmur, click, rub or gallop Abdomen: soft, non-tender; bowel sounds normal; no masses,  no organomegaly Extremities: extremities normal, atraumatic, no cyanosis or edema Lymph nodes: Cervical, supraclavicular, and axillary nodes normal. Neurologic: Alert and oriented X 3, normal strength and tone. Normal symmetric reflexes. Normal  coordination and gait    Assessment:    Healthy female exam. Normal exam     Plan:  Normal well exam. Vaccination record reviewed and discussed. She stated that she received her #1 shingrix shot at the pharmacy. She will obtain a copy and get her dose #2 as well She is now due to her colon cancer screening. Her previous appointment with Dr. Collene Mares was canceled due to her hip surgery. She plan on contacting Dr. Lorie Apley office to schedule an appointment. Mammogram due in Oct, she will self-schedule. A1C improved from her last. Diet and exercise counseling provided.  Urine hesitancy: ?? BOO which is less likely. UA obtained. I will contact her with result. Consider urology referral vs bladder US. I will contact her with her UA result and discuss plan.  She agreed with the plan.    See After Visit Summary for Counseling Recommendations    Andrena Mews, MD [place]

## 2022-01-03 NOTE — Addendum Note (Signed)
Addended by: Andrena Mews T on: 01/03/2022 03:34 PM   Modules accepted: Orders

## 2022-01-03 NOTE — Patient Instructions (Signed)
Colonoscopy, Adult A colonoscopy is an exam to look at the large intestine. It is done using a long, thin, flexible tube that has a camera on the end. This exam is done to check for problems, such as: An abnormal growth of cells or tissue (tumor). Abnormal growths within the lining of your intestine (polyps). Irritation and swelling (inflammation). Bleeding. Tell your doctor about: Any allergies you have. All medicines you are taking. Tell him or her about vitamins, herbs, eye drops, creams, and over-the-counter medicines. Any problems you or family members have had with anesthetic medicines. Any bleeding problems you have. Any surgeries you have had. Any medical conditions you have. Any problems you have had with pooping (having bowel movements). Whether you are pregnant or may be pregnant. What are the risks? Generally, this is a safe procedure. However, problems may occur, such as: Bleeding. Damage to your intestine. Allergic reactions to medicines given during the procedure. Infection. This is rare. What happens before the procedure? Eating and drinking Follow instructions from your doctor about eating and drinking. These may include: A few days before the procedure: Follow a low-fiber diet. Avoid these foods: Nuts. Seeds. Dried fruit. Raw fruits. Vegetables. 1-3 days before the procedure: Eat only gelatin dessert or ice pops. Drink only clear liquids, such as: Water. Clear broth or bouillon. Black coffee or tea. Clear juice. Clear soft drinks or sports drinks. Avoid liquids that have red or purple dye. On the day of the procedure: Do not eat solid foods. You may continue to drink clear liquids until up to 2 hours before the procedure. Do not eat or drink anything starting 2 hours before the procedure or as told by your doctor. Bowel prep If you were prescribed a bowel prep to take by mouth (orally) to clean out your colon: Take it as told by your doctor. Starting  the day before your procedure, you will need to drink a lot of liquid medicine. The liquid will cause you to poop until your poop is almost clear or light green. If your skin or butt gets irritated from diarrhea, you may: Wipe the area with wipes that have medicine in them, such as adult wet wipes with aloe and vitamin E. Put something on your skin that soothes the area, such as petroleum jelly. If you vomit while drinking the bowel prep: Take a break for up to 60 minutes. Begin the bowel prep again. Call your doctor if you keep vomiting and you cannot take the bowel prep without vomiting. To clean out your colon, you may also be given: Laxative medicines. These help you poop. Instructions for using a liquid medicine (enema) injected into your butt. Medicines Ask your doctor about changing or stopping: Your normal medicines. Vitamins, herbs, and supplements. Over-the-counter medicines. Do not take aspirin or ibuprofen unless you are told to. General instructions Ask your doctor what steps will be taken to prevent the spread of germs. These may include washing skin with a soap that kills germs. If you will be going home right after the procedure, plan to have a responsible adult: Take you home from the hospital or clinic. You will not be allowed to drive. Care for you for the time you are told. What happens during the procedure?  An IV tube will be put into one of your veins. You will be given a medicine to make you fall asleep (general anesthetic). You will lie on your side with your knees bent. Oil or gel will be put on   the tube. Then the tube will be: Put into the opening of the butt (anus). Gently put into your large intestine. Air will be sent into your colon to keep it open. You may feel some pressure or cramping. The camera will be used to take photos that will appear on a screen. A small tissue sample may be removed for testing (biopsy). If small growths are found, your doctor  may remove them and have them checked for cancer. The tube will be slowly removed. The procedure may vary among doctors and hospitals. What happens after the procedure? You will be monitored until you leave the hospital or clinic. This includes checking your blood pressure, heart rate, breathing rate, and blood oxygen level. You may have a small amount of blood in your poop. You may pass gas. You may have mild cramps or bloating in your belly (abdomen). If you were given a sedative during your procedure, do not drive or use machines until your doctor says that it is safe. It is up to you to get the results of your procedure. Ask how to get your results when they are ready. Summary A colonoscopy is an exam to look at the large intestine. Follow instructions from your doctor about eating and drinking before the procedure. You may be prescribed an oral bowel prep to clean out your colon. Take it as told by your doctor. A flexible tube with a camera on its end will be put into the opening of the butt. It will be passed into the large intestine. This information is not intended to replace advice given to you by your health care provider. Make sure you discuss any questions you have with your health care provider. Document Revised: 07/04/2021 Document Reviewed: 03/02/2021 Elsevier Patient Education  2023 [employer] Inc[employer]  

## 2022-01-03 NOTE — Telephone Encounter (Signed)
I called to discuss normal UA report with her. Given that this is an going symptoms for > 6 months and is worsening, I discussed bladder US vs urology referral. She opted for [place] to further evaluate. My team will reach out to schedule.  NB: It seems that insurance would not cover [place] for her diagnosis due to [place] waiver request. I called to discuss this and offer urology referral. She declined referral and opted to monitor symptoms for now.

## 2022-01-03 NOTE — Progress Notes (Signed)
NB: After visit, I was informed, she was unable to give her urine. She will return to lab for UA in the future.

## 2022-01-28 LAB — GLUCOSE, POCT (MANUAL RESULT ENTRY): POC Glucose: 156 mg/dl — AB (ref 70–99)

## 2022-03-09 ENCOUNTER — Telehealth: Payer: Self-pay | Admitting: Student

## 2022-03-09 NOTE — Telephone Encounter (Signed)
Per patient in MyChart message, okay to not re-schedule follow up appointment with another provider as of this time (saw CC previously).

## 2022-03-10 ENCOUNTER — Telehealth: Payer: Self-pay

## 2022-03-10 DIAGNOSIS — R42 Dizziness and giddiness: Secondary | ICD-10-CM

## 2022-03-10 NOTE — Telephone Encounter (Signed)
I called and discussed her symptoms with her. This started with a headache and then dizziness with certain rapid movements. She denies gait abnormality, falls, or limb weakness. The headache resolved s/p Imitrex 3 days ago. I discussed d/e of Meclizine with her. A rehab referral was placed. CVA red flags and ED precautions were discussed. She verbalized understanding.

## 2022-03-10 NOTE — Telephone Encounter (Signed)
Patient calls nurse line regarding vertigo. She has been having flare up since Tuesday, 8/15.   Patient requesting meclizine. Advised that per previous note, Dr. Gwendlyn Deutscher does not recommend this medication for her due to side effects. She is agreeable to referral to vestibular rehab.   Patient is requesting further recommendations for management of vertigo. She states that she does not want to go through the weekend feeling like this.   Forwarding to PCP for further advisement.   Talbot Grumbling, RN

## 2022-03-10 NOTE — Addendum Note (Signed)
Addended by: Andrena Mews T on: 03/10/2022 12:00 PM   Modules accepted: Orders

## 2022-03-14 ENCOUNTER — Telehealth: Payer: Self-pay

## 2022-03-14 DIAGNOSIS — Z20822 Contact with and (suspected) exposure to covid-19: Secondary | ICD-10-CM | POA: Diagnosis not present

## 2022-03-14 NOTE — Telephone Encounter (Signed)
Patient calls nurse line in regards to Vestibular Rehab.   Patient reports she has not heard from anyone.   Number given to patient to call and scheduled.   [facility]) [phone]

## 2022-03-15 ENCOUNTER — Other Ambulatory Visit: Payer: Self-pay | Admitting: Family Medicine

## 2022-03-15 DIAGNOSIS — R42 Dizziness and giddiness: Secondary | ICD-10-CM

## 2022-03-15 DIAGNOSIS — Z20822 Contact with and (suspected) exposure to covid-19: Secondary | ICD-10-CM | POA: Diagnosis not present

## 2022-03-15 NOTE — Progress Notes (Signed)
Referral to vestibular rehab was placed on 8/18. Apparently, they needed a different order, which I placed. I called neuro-rehab to confirm that they received the order. They were able to pull it up and will reach out to the patient. I called to discuss this with the patient, but at the same time, she was receiving a call from neuro rehab. I will check on her later to ensure that her appointment has been scheduled.

## 2022-03-15 NOTE — Telephone Encounter (Signed)
Patient calls nurse line reporting she contacted [facility] for an apt.   Patient reports they need a referral from our office before they can see her.   Will forward to PCP and RC.

## 2022-03-16 NOTE — Telephone Encounter (Signed)
Patient updated.   Patient reports she already has an apt.

## 2022-03-18 DIAGNOSIS — Z20822 Contact with and (suspected) exposure to covid-19: Secondary | ICD-10-CM | POA: Diagnosis not present

## 2022-03-19 DIAGNOSIS — Z20822 Contact with and (suspected) exposure to covid-19: Secondary | ICD-10-CM | POA: Diagnosis not present

## 2022-03-22 DIAGNOSIS — Z20822 Contact with and (suspected) exposure to covid-19: Secondary | ICD-10-CM | POA: Diagnosis not present

## 2022-03-22 NOTE — Therapy (Signed)
OUTPATIENT PHYSICAL THERAPY VESTIBULAR EVALUATION     Patient Name: Angie [name] MRN: [id number] DOB:11-Jun-1946, 76 y.o., female Today's Date: 03/23/2022  PCP: [name], MD REFERRING PROVIDER: [name], MD   PT End of Session - 03/23/22 1245     Visit Number 1    Number of Visits 5    Date for PT Re-Evaluation 04/22/22    Authorization Type Medicare    PT Start Time 1150    PT Stop Time 1232    PT Time Calculation (min) 42 min    Activity Tolerance Patient tolerated treatment well    Behavior During Therapy [facility] for tasks assessed/performed             Past Medical History:  Diagnosis Date   Arthritis    BPV (benign positional vertigo) 03/25/2019   Carpal tunnel syndrome 09/01/2009   Chronic back pain    Diverticulosis    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  Never received treatment.  No signs of active disease and low risk for reactivation.  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  Patient declines referral to health department to discuss at this time.    Pre-diabetes    Past Surgical History:  Procedure Laterality Date   CARPAL TUNNEL RELEASE Bilateral 2012   CHOLECYSTECTOMY  1982   MAXIMUM ACCESS (MAS)POSTERIOR LUMBAR INTERBODY FUSION (PLIF) 2 LEVEL N/A 08/08/2018   Procedure: Decompression and  Fusion of Lumbar Four-Five Lumbar Five-Sacral One, Pedicle Screws of Lumbar Four- Lumbar Five, Lumbar Five- Sacral One, Lumbar Four-Lumbar Five Interbodies;  Surgeon: Erline Levine, MD;  Location: [place];  Service: Neurosurgery;  Laterality: N/A;  Decompression and  Fusion of Lumbar Four-Five Lumbar Five-Sacral One, Pedicle Screws of Lumbar Four- Lumbar Five, Lumbar Fiv   ROTATOR CUFF REPAIR Bilateral  2009   TOTAL HIP ARTHROPLASTY Right 08/22/2021   Procedure: TOTAL HIP ARTHROPLASTY;  Surgeon: Willaim Sheng, MD;  Location: [facility];  Service: Orthopedics;  Laterality: Right;   TUBAL LIGATION     Patient Active Problem List   Diagnosis Date Noted   Osteoarthritis of right hip, unspecified osteoarthritis type 08/22/2021   Degenerative joint disease (DJD) of hip 07/27/2021   Rash 11/09/2020   H/O colonoscopy 12/29/2019   Spondylolisthesis of lumbar region 08/08/2018   Nasal polyp, benign 03/30/2017   Pre-diabetes 10/13/2016   GERD (gastroesophageal reflux disease) 06/22/2015   Hot flash, menopausal 06/22/2015   Herpes simplex 04/11/2011   Hyperlipidemia 32/44/0102   Lichen sclerosus of female genitalia 05/21/2009   VARICOSE VEINS, LOWER EXTREMITIES, MILD 72/53/6644   SYSTOLIC MURMUR 03/47/4259   Morbid obesity ([place]) 09/20/2006   HYPERTENSION, BENIGN SYSTEMIC 09/20/2006   DIVERTICULOSIS OF COLON 09/20/2006    ONSET DATE: 03/15/2022   REFERRING DIAG: R42 (ICD-10-CM) - Vertigo   THERAPY DIAG:  BPPV (benign paroxysmal positional vertigo), left  Dizziness and giddiness  Rationale for Evaluation and Treatment Rehabilitation  SUBJECTIVE:   SUBJECTIVE STATEMENT: Reports was at the beach last week and had a headache and thought it was going to be a migraine. Reports no medication worked for her migraine. When she woke up the next morning, felt like everything was spinning around. Layed back down. Reports the  spinning could last hours or if she makes the wrong move then it is not gone. Had 2 outdated meclizine and took them and reports that it helped a little bit. Had this issue before in January and was unable to go to therapy. Looked up the Epley maneuver and did that and reports that it helped. Has not tried the Epley this go round. Has taken meclizine in the past at that has helped. Can drive and turn her head now without any issues. Didn't wake up this morning with the spinning  sensation. Last time pt had an episode was at the beach, has not had any episodes this week.   Pt accompanied by: self  PERTINENT HISTORY: PMH: hx of dizziness, HLD, HTN, chronic back pain, migaraines, lumbar interbody fusion 07/2018, R THA 07/2021, SNHL of both ears, type 2 diabetes.    PAIN:  Are you having pain? No  Vitals:   03/23/22 1202  BP: 131/65  Pulse: 75     PRECAUTIONS: None  FALLS: Has patient fallen in last 6 months? No  LIVING ENVIRONMENT: Lives with: lives with their spouse Lives in: House/apartment Stairs: Yes: Internal: 16 steps; on right going up and External: 7 steps; can reach both Has following equipment at home: Single point cane after previous surgeries   PLOF: Independent and Vocation/Vocational requirements: Retired Marine scientist.   PATIENT GOALS Learn exercises to help with dizziness.   OBJECTIVE:     COGNITION: Overall cognitive status: Within functional limits for tasks assessed    PATIENT SURVEYS:  FOTO Staff did not capture.    VESTIBULAR ASSESSMENT   GENERAL OBSERVATION: Ambulates with no AD into session.    SYMPTOM BEHAVIOR:   Subjective history: See above.    Non-Vestibular symptoms: nausea/vomiting and migraine symptoms   Type of dizziness: Spinning/Vertigo and "feels like she is falling in the ocean"    Frequency: One time at the beach   Duration: Pt reports it lasted a couple hours    Aggravating factors: Induced by position change: rolling to the left and sit to stand and Induced by motion: looking up at the ceiling, bending down to the ground, turning body quickly, and turning head quickly Reports a little spinning when looking up to put something in the closet.    Relieving factors: slow movements   Progression of symptoms: better   OCULOMOTOR EXAM:   Ocular Alignment: normal   Ocular ROM: No Limitations   Spontaneous Nystagmus: absent   Gaze-Induced Nystagmus: absent   Smooth Pursuits: intact   Saccades: intact ~2 saccadic  beats from inferior > midline.       VESTIBULAR - OCULAR REFLEX:    Slow VOR: Normal   VOR Cancellation: Normal   Head-Impulse Test: HIT Right: positive HIT Left: negative   No dizziness     POSITIONAL TESTING: Right Dix-Hallpike: no nystagmus Left Dix-Hallpike: upbeating, left nystagmus and Duration: 10 seconds      VESTIBULAR TREATMENT:  Canalith Repositioning:   Epley Left: Number of Reps: 1 and Response to Treatment: symptoms resolved   Re-assessed after 1 rep with pt with no dizziness or nystagmus.    PATIENT EDUCATION: Education details: Clinical findings, POC, Etiology of BPPV and purpose of maneuvers to treat (also gave handout on this), due to resolution of L BPPV at end of session will schedule 1 additional appt next week to make sure that it is still clear or if pt has no symptoms then can cancel appt, gave self Epley maneuver for home  in case it returns in the future. Discussed that pt can also get a new referral to return in the future if BPPV comes back. Person educated: Patient Education method: Explanation, Demonstration, and Handouts Education comprehension: verbalized understanding   GOALS: Goals reviewed with patient? Yes  SHORT TERM GOALS: ALL STGS= LTGS  LONG TERM GOALS: Target date: 04/20/2022    Pt will demonstrate resolution of L posterior canal BPPV in order to demo decr dizziness/unsteadiness during functional mobility. Baseline:  Goal status: INITIAL  2.  Pt will verbalize/demo understanding of self L Epley maneuver for home in case BPPV returns.  Baseline:  Goal status: INITIAL ASSESSMENT:  CLINICAL IMPRESSION: Patient is a 76  year old female referred to Neuro [facility] for Vertigo.   Pt's PMH is significant for: hx of dizziness, HLD, HTN, chronic back pain, migaraines, lumbar interbody fusion 07/2018, R THA 07/2021, SNHL of both ears, type 2 diabetes. The following deficits were present during the exam: positive HIT on R, positive L Dix  Hallpike  indicating L posterior canal BPPV. Treated with the Epley maneuver x1 rep with pt demonstrating resolution of nystagmus and dizziness upon re-assessment  Pt would benefit from skilled PT to address these impairments and functional limitations to maximize functional mobility independence and decr dizziness.     OBJECTIVE IMPAIRMENTS decreased balance and dizziness.   ACTIVITY LIMITATIONS bending, bed mobility, and reach over head  PARTICIPATION LIMITATIONS: community activity  PERSONAL FACTORS Behavior pattern, Past/current experiences, and Time since onset of injury/illness/exacerbation are also affecting patient's functional outcome.   REHAB POTENTIAL: Excellent  CLINICAL DECISION MAKING: Stable/uncomplicated  EVALUATION COMPLEXITY: Low   PLAN: PT FREQUENCY: 1x/week  PT DURATION: 4 weeks  PLANNED INTERVENTIONS: Therapeutic exercises, Therapeutic activity, Neuromuscular re-education, Balance training, Gait training, Patient/Family education, Self Care, Vestibular training, and Canalith repositioning  PLAN FOR NEXT SESSION: Re-assess L BPPV. Check horizontal canals just in case. Likely D/C    Arliss Journey, PT, DPT  03/23/2022, 12:46 PM

## 2022-03-23 ENCOUNTER — Ambulatory Visit: Payer: Self-pay

## 2022-03-23 ENCOUNTER — Ambulatory Visit: Payer: Medicare Other | Attending: Family Medicine | Admitting: Physical Therapy

## 2022-03-23 VITALS — BP 131/65 | HR 75

## 2022-03-23 DIAGNOSIS — H8112 Benign paroxysmal vertigo, left ear: Secondary | ICD-10-CM | POA: Insufficient documentation

## 2022-03-23 DIAGNOSIS — R42 Dizziness and giddiness: Secondary | ICD-10-CM | POA: Diagnosis not present

## 2022-03-23 DIAGNOSIS — Z20822 Contact with and (suspected) exposure to covid-19: Secondary | ICD-10-CM | POA: Diagnosis not present

## 2022-03-23 NOTE — Patient Instructions (Signed)
Visit Information  Angie [name]  it was nice speaking with you. Please call me directly [phone] if you have questions about the goals we discussed.  Patient Goals/Self Care Activities: -Patient will self administer medications as prescribed as evidenced by self report/primary caregiver report  -Patient will attend all scheduled provider appointments as evidenced by clinician review of documented attendance to scheduled appointments and patient/caregiver report -Patient will call pharmacy for medication refills as evidenced by patient report and review of pharmacy fill history as appropriate -Patient will call provider office for new concerns or questions as evidenced by review of documented incoming telephone call notes and patient report -Patient verbalizes understanding of plan --Patient will focus on medication adherence by taking medications as prescribed. - drink 6 to 8 glasses of water each day - join a weight loss program - keep a food diary - manage portion size - set a realistic goal     Patient verbalizes understanding of instructions and care plan provided today and agrees to view in [place]. Active MyChart status and patient understanding of how to access instructions and care plan via MyChart confirmed with patient.     Follow up Plan: Patient would like continued follow-up.  CM RNCM will outreach the patient within the next 16 weeks  Patient will call office if needed prior to next encounter  Lazaro Arms, RN  [phone]

## 2022-03-23 NOTE — Chronic Care Management (AMB) (Signed)
RNCM Care Management Note 03/23/2022 Name: Angie [name] MRN: [id number] DOB: 09-Feb-1946   Angie [name] is a 76 y.o. year old female who sees [name], MD for primary care. RNCM was consulted  to assistance patient with  Care Coordination.      Engaged with patient by telephone for follow up visit in response to provider referral for Care Coordination.     Summary:  The patient today and she informed me that she has achieved her weight-loss goal. She has lost 7 pounds out of the targeted 10 pounds. Additionally, she has recently undergone a successful hip replacement surgery and is recovering well. . See Care Plan below for interventions and patient self-care actives.  Recommendation: The patient may benefit from taking medications as prescribed, exercising as tolerated, and The patient agrees.  Follow up Plan: Patient would like continued follow-up.  CM RNCM will outreach the patient within the next 16 weeks  Patient will call office if needed prior to next encounter   SDOH (Social Determinants of Health) screening and interventions performed today: no     RN Plan of Care for Conditions/Un Met Needs Addressed and Monitored    Patient Care Plan: RN Case Manager     Problem Identified: Weight Managment (Imbalanced nutrition more than body requirements)      Long-Range Goal: The patient will demonstrate and displaay weight loss maintenance that is optimal for her health. Completed 03/23/2022  Start Date: 06/03/2021  Expected End Date: 03/23/2022  Priority: High  Note:   Current Barriers:  Knowledge Deficits related to weight loss strategies as evidenced by weight gain reported/observed and/or altered eating habits Chronic Disease Management support and education needs related to weight management in patient with HLD  RNCM Clinical Goal(s):  Patient will verbalize understanding of plan for management of weightloss as evidenced by reduction of 1+2 pounds  per week through collaboration with RN Care manager, provider, and care team. . demonstrate ongoing self health care management ability by identifing negative behaviors that cause weight gain  and over eating as evidenced by change in food intake and exercise as tolerated   Interventions: 1:1 collaboration with primary care provider regarding development and update of comprehensive plan of care as evidenced by provider attestation and co-signature Inter-disciplinary care team collaboration (see longitudinal plan of care) Evaluation of current treatment plan related to  self management and patient's adherence to plan as established by provider  Weight Loss Interventions:  Advised patient to discuss with primary care provider options regarding weight management;  Collaboration with provider for dietician referral;  Provided verbal and/or written education to patient re: provider recommended life style modifications;  Reviewed recommended dietary changes: avoid fad diets, make small/incremental dietary and exercise changes, eat at the table and avoid eating in front of the TV, plan management of cravings, monitor snacking and cravings in food diary;  03/23/22: I had a conversation with the patient today and she informed me that she has achieved her weight-loss goal. She has lost 7 pounds out of the targeted 10 pounds. Additionally, she has recently undergone a successful hip replacement surgery and is recovering well. She is also regularly exercising and planning to undergo a knee replacement surgery sometime in November or December. She has requested follow-up calls every three months.I had a conversation with the patient today and she informed me that she has achieved her weight-loss goal. She has lost 7 pounds out of the targeted 10 pounds. Additionally, she has recently undergone a successful  hip replacement surgery and is recovering well.     Patient Goals/Self Care Activities: -Patient will self  administer medications as prescribed as evidenced by self report/primary caregiver report  -Patient will attend all scheduled provider appointments as evidenced by clinician review of documented attendance to scheduled appointments and patient/caregiver report -Patient will call pharmacy for medication refills as evidenced by patient report and review of pharmacy fill history as appropriate -Patient will call provider office for new concerns or questions as evidenced by review of documented incoming telephone call notes and patient report -Patient verbalizes understanding of plan --Patient will focus on medication adherence by taking medications as prescribed. - drink 6 to 8 glasses of water each day - join a weight loss program - keep a food diary - manage portion size - set a realistic goal  Resolving due to duplicate goal         Lazaro Arms RN, BSN, [facility]   Phone: [phone]

## 2022-03-29 ENCOUNTER — Encounter: Payer: Self-pay | Admitting: Physical Therapy

## 2022-03-30 DIAGNOSIS — Z20822 Contact with and (suspected) exposure to covid-19: Secondary | ICD-10-CM | POA: Diagnosis not present

## 2022-03-31 DIAGNOSIS — Z20822 Contact with and (suspected) exposure to covid-19: Secondary | ICD-10-CM | POA: Diagnosis not present

## 2022-04-04 ENCOUNTER — Other Ambulatory Visit: Payer: Self-pay | Admitting: Family Medicine

## 2022-04-04 DIAGNOSIS — Z1231 Encounter for screening mammogram for malignant neoplasm of breast: Secondary | ICD-10-CM

## 2022-04-18 ENCOUNTER — Encounter: Payer: Self-pay | Admitting: Family Medicine

## 2022-04-18 ENCOUNTER — Ambulatory Visit (INDEPENDENT_AMBULATORY_CARE_PROVIDER_SITE_OTHER): Payer: Medicare Other | Admitting: Family Medicine

## 2022-04-18 VITALS — BP 138/72 | HR 81 | Ht 62.0 in | Wt 204.0 lb

## 2022-04-18 DIAGNOSIS — J309 Allergic rhinitis, unspecified: Secondary | ICD-10-CM | POA: Diagnosis not present

## 2022-04-18 DIAGNOSIS — Z1211 Encounter for screening for malignant neoplasm of colon: Secondary | ICD-10-CM

## 2022-04-18 DIAGNOSIS — Z9889 Other specified postprocedural states: Secondary | ICD-10-CM

## 2022-04-18 DIAGNOSIS — R7303 Prediabetes: Secondary | ICD-10-CM

## 2022-04-18 DIAGNOSIS — Z23 Encounter for immunization: Secondary | ICD-10-CM

## 2022-04-18 DIAGNOSIS — R339 Retention of urine, unspecified: Secondary | ICD-10-CM | POA: Diagnosis not present

## 2022-04-18 LAB — POCT GLYCOSYLATED HEMOGLOBIN (HGB A1C): HbA1c, POC (controlled diabetic range): 5.9 % (ref 0.0–7.0)

## 2022-04-18 MED ORDER — LEVOCETIRIZINE DIHYDROCHLORIDE 5 MG PO TABS: 5.0000 mg | ORAL_TABLET | Freq: Every evening | ORAL | 1 refills | Status: DC

## 2022-04-18 MED ORDER — MOMETASONE FUROATE 50 MCG/ACT NA SUSP: 2.0000 | Freq: Every day | NASAL | 1 refills | Status: DC

## 2022-04-18 MED ORDER — TAMSULOSIN HCL 0.4 MG PO CAPS: 0.4000 mg | ORAL_CAPSULE | Freq: Every day | ORAL | 0 refills | Status: DC

## 2022-04-18 NOTE — Assessment & Plan Note (Signed)
A1C looks great. Repeat Q6 monthly.

## 2022-04-18 NOTE — Assessment & Plan Note (Signed)
Declined GI referral for colonoscopy. Prefers cologaurd which was ordered. She is aware that she might need colonoscopy if her cologaurd is abnormal. Test ordered.

## 2022-04-18 NOTE — Patient Instructions (Signed)
Tamsulosin Capsules What is this medication? TAMSULOSIN (tam SOO loe sin) works by relaxing the muscles in the  bladder, which makes it easier to urinate. It belongs to a group of medications called alpha blockers. This medicine may be used for other purposes; ask your health care provider or pharmacist if you have questions. COMMON BRAND NAME(S): Flomax What should I tell my care team before I take this medication? They need to know if you have any of the following conditions: Advanced kidney disease Advanced liver disease Low blood pressure Prostate cancer An unusual or allergic reaction to tamsulosin, sulfa drugs, other medications, foods, dyes, or preservatives Pregnant or trying to get pregnant Breast-feeding How should I use this medication? Take this medication by mouth about 30 minutes after the same meal every day. Follow the directions on the prescription label. Swallow the capsules whole with a glass of water. Do not crush, chew, or open capsules. Do not take your medication more often than directed. Do not stop taking your medication unless your care team tells you to. Talk to your care team about the use of this medication in children. Special care may be needed. Overdosage: If you think you have taken too much of this medicine contact a poison control center or emergency room at once. NOTE: This medicine is only for you. Do not share this medicine with others. What if I miss a dose? If you miss a dose, take it as soon as you can. If it is almost time for your next dose, take only that dose. Do not take double or extra doses. If you stop taking your medication for several days or more, ask your care team what dose you should start back on. What may interact with this medication? Cimetidine Fluoxetine Ketoconazole Medications for erectile dysfunction like sildenafil, tadalafil, vardenafil Medications for high blood pressure Other alpha-blockers like alfuzosin, doxazosin,  phentolamine, phenoxybenzamine, prazosin, terazosin Warfarin This list may not describe all possible interactions. Give your health care provider a list of all the medicines, herbs, non-prescription drugs, or dietary supplements you use. Also tell them if you smoke, drink alcohol, or use illegal drugs. Some items may interact with your medicine. What should I watch for while using this medication? Visit your care team for regular check-ups. You will need lab work done before you start this medication and regularly while you are taking it. Check your blood pressure as directed. Ask your care team what your blood pressure should be, and when you should contact him or her. This medication may make you feel dizzy or lightheaded. This is more likely to happen after the first dose, after an increase in dose, or during hot weather or exercise. Drinking alcohol and taking some medications can make this worse. Do not drive, use machinery, or do anything that needs mental alertness until you know how this medication affects you. Do not sit or stand up quickly. If you begin to feel dizzy, sit down until you feel better. These effects can decrease once your body adjusts to the medication. Contact your care team right away if you have an erection that lasts longer than 4 hours or if it becomes painful. This may be a sign of a serious problem and must be treated right away to prevent permanent damage. If you are thinking of having cataract surgery, tell your eye surgeon that you have taken this medication. What side effects may I notice from receiving this medication? Side effects that you should report to your care  team as soon as possible: Allergic reactions--skin rash, itching, hives, swelling of the face, lips, tongue, or throat Low blood pressure--dizziness, feeling faint or lightheaded, blurry vision Prolonged or painful erection Side effects that usually do not require medical attention (report to your care  team if they continue or are bothersome): Change in sex drive or performance Dizziness Headache Runny or stuffy nose This list may not describe all possible side effects. Call your doctor for medical advice about side effects. You may report side effects to FDA at 1-800-FDA-1088. Where should I keep my medication? Keep out of the reach of children. Store at room temperature between 15 and 30 degrees C (59 and 86 degrees F). Throw away any unused medication after the expiration date. NOTE: This sheet is a summary. It may not cover all possible information. If you have questions about this medicine, talk to your doctor, pharmacist, or health care provider.  2023 Elsevier/Gold Standard (2007-08-31 00:00:00)

## 2022-04-18 NOTE — Assessment & Plan Note (Signed)
Associated with postnasal drip Start Nasonex and Xyzal. Prescription escribed. F/U soon if there is no improvement. She agreed with the plan.

## 2022-04-18 NOTE — Progress Notes (Signed)
    SUBJECTIVE:   CHIEF COMPLAINT / HPI:   Throat irritation: She c/o throat irritation with frequent throat clearing, which started three years ago. This has affected her voice and is progressively worsening. She endorses a postnasal drip of sticky sputum behind her throat, with cough on and off. She tried hot tea, lemon, and mucinex with mild improvement. She denies chest pain or SOB.  Incomplete bladder emptying: This persists with no improvement. No dysuria or change in urine color.   PreDM: Here for f/u.  Colon cancer screen: She does not wish to get a colonoscopy done. She is okay with getting cologuard testing.   PERTINENT  PMH / PSH: PMHx reviewed  OBJECTIVE:   BP 138/72   Pulse 81   Ht '5\' 2"'$  (1.575 m)   Wt 204 lb (92.5 kg)   SpO2 99%   BMI 37.31 kg/m   Physical Exam Vitals and nursing note reviewed.  HENT:     Right Ear: Tympanic membrane normal.     Left Ear: Tympanic membrane normal.     Mouth/Throat:     Pharynx: Oropharynx is clear. No oropharyngeal exudate or posterior oropharyngeal erythema.  Cardiovascular:     Rate and Rhythm: Normal rate and regular rhythm.     Heart sounds: Normal heart sounds. No murmur heard. Pulmonary:     Effort: Pulmonary effort is normal. No respiratory distress.     Breath sounds: Normal breath sounds.  Abdominal:     General: Abdomen is flat. Bowel sounds are normal. There is no distension.     Palpations: Abdomen is soft.     Tenderness: There is no abdominal tenderness.  Musculoskeletal:     Right lower leg: No edema.     Left lower leg: No edema.      ASSESSMENT/PLAN:   Allergic rhinosinusitis Associated with postnasal drip Start Nasonex and Xyzal. Prescription escribed. F/U soon if there is no improvement. She agreed with the plan.  Incomplete emptying of bladder Willing to trial flomax. S/E discussed including hypotension. She will keep an eye on her BP and intolerance. F/U in 4 weeks for  reassessment. She will consider urology referral if no improvement of her symptoms.   Pre-diabetes A1C looks great. Repeat Q6 monthly.   H/O colonoscopy Declined GI referral for colonoscopy. Prefers cologaurd which was ordered. She is aware that she might need colonoscopy if her cologaurd is abnormal. Test ordered.      Andrena Mews, MD [place]

## 2022-04-18 NOTE — Assessment & Plan Note (Signed)
Willing to trial flomax. S/E discussed including hypotension. She will keep an eye on her BP and intolerance. F/U in 4 weeks for reassessment. She will consider urology referral if no improvement of her symptoms.

## 2022-05-04 DIAGNOSIS — Z1211 Encounter for screening for malignant neoplasm of colon: Secondary | ICD-10-CM | POA: Diagnosis not present

## 2022-05-05 ENCOUNTER — Ambulatory Visit
Admission: RE | Admit: 2022-05-05 | Discharge: 2022-05-05 | Disposition: A | Discharge status: Home / Self Care | Payer: Medicare Other | Source: Ambulatory Visit | Attending: Family Medicine | Admitting: Family Medicine

## 2022-05-05 DIAGNOSIS — Z1231 Encounter for screening mammogram for malignant neoplasm of breast: Secondary | ICD-10-CM

## 2022-05-09 DIAGNOSIS — M25562 Pain in left knee: Secondary | ICD-10-CM | POA: Diagnosis not present

## 2022-05-09 DIAGNOSIS — M25561 Pain in right knee: Secondary | ICD-10-CM | POA: Diagnosis not present

## 2022-05-10 LAB — COLOGUARD: COLOGUARD: NEGATIVE

## 2022-05-15 ENCOUNTER — Encounter: Payer: Self-pay | Admitting: Family Medicine

## 2022-05-19 ENCOUNTER — Encounter: Payer: Self-pay | Admitting: Family Medicine

## 2022-05-19 ENCOUNTER — Ambulatory Visit (INDEPENDENT_AMBULATORY_CARE_PROVIDER_SITE_OTHER): Payer: Medicare Other | Admitting: Family Medicine

## 2022-05-19 VITALS — BP 137/70 | HR 85 | Ht 63.0 in

## 2022-05-19 DIAGNOSIS — L989 Disorder of the skin and subcutaneous tissue, unspecified: Secondary | ICD-10-CM

## 2022-05-19 DIAGNOSIS — R0609 Other forms of dyspnea: Secondary | ICD-10-CM

## 2022-05-19 DIAGNOSIS — G8929 Other chronic pain: Secondary | ICD-10-CM

## 2022-05-19 DIAGNOSIS — R339 Retention of urine, unspecified: Secondary | ICD-10-CM

## 2022-05-19 DIAGNOSIS — M25562 Pain in left knee: Secondary | ICD-10-CM | POA: Diagnosis not present

## 2022-05-19 NOTE — Patient Instructions (Signed)
Kegel Exercises  Kegel exercises can help strengthen your pelvic floor muscles. The pelvic floor is a group of muscles that support your rectum, small intestine, and bladder. In females, pelvic floor muscles also help support the uterus. These muscles help you control the flow of urine and stool (feces). Kegel exercises are painless and simple. They do not require any equipment. Your provider may suggest Kegel exercises to: Improve bladder and bowel control. Improve sexual response. Improve weak pelvic floor muscles after surgery to remove the uterus (hysterectomy) or after pregnancy, in females. Improve weak pelvic floor muscles after prostate gland removal or surgery, in males. Kegel exercises involve squeezing your pelvic floor muscles. These are the same muscles you squeeze when you try to stop the flow of urine or keep from passing gas. The exercises can be done while sitting, standing, or lying down, but it is best to vary your position. Ask your health care provider which exercises are safe for you. Do exercises exactly as told by your health care provider and adjust them as directed. Do not begin these exercises until told by your health care provider. Exercises How to do Kegel exercises: Squeeze your pelvic floor muscles tight. You should feel a tight lift in your rectal area. If you are a female, you should also feel a tightness in your vaginal area. Keep your stomach, buttocks, and legs relaxed. Hold the muscles tight for up to 10 seconds. Breathe normally. Relax your muscles for up to 10 seconds. Repeat as told by your health care provider. Repeat this exercise daily as told by your health care provider. Continue to do this exercise for at least 4-6 weeks, or for as long as told by your health care provider. You may be referred to a physical therapist who can help you learn more about how to do Kegel exercises. Depending on your condition, your health care provider may  recommend: Varying how long you squeeze your muscles. Doing several sets of exercises every day. Doing exercises for several weeks. Making Kegel exercises a part of your regular exercise routine. This information is not intended to replace advice given to you by your health care provider. Make sure you discuss any questions you have with your health care provider. Document Revised: 11/18/2020 Document Reviewed: 11/18/2020 Elsevier Patient Education  2023 [employer] Inc.  

## 2022-05-19 NOTE — Assessment & Plan Note (Signed)
Improved on Flomax

## 2022-05-19 NOTE — Progress Notes (Signed)
    SUBJECTIVE:   CHIEF COMPLAINT / HPI:   HPI:  Surg Clearance:  She is scheduled for left knee surgery in January 2024. She was informed by ortho that she needed a cardiology letter for surgery. However, it seems she will need a new referral to a different cardiology per the patient. She denies any cardiac concerns at this time.  Incomplete Bladder Emptying: This has improved a lot since she started Flomax.  Health maintenance: She is due for Shingrix - She stated that she got her shingrix at [facility] a few weeks ago. Due for physical.  Skin lesion: New left shin lesion noticed a few weeks ago. No change in size or color.   PERTINENT  PMH / PSH: PMHx reviewed  OBJECTIVE:   BP 137/70   Pulse 85   Ht '5\' 3"'$  (1.6 m)   SpO2 99%   BMI 36.14 kg/m   Physical Exam Vitals and nursing note reviewed.  Cardiovascular:     Rate and Rhythm: Normal rate and regular rhythm.     Heart sounds: Normal heart sounds. No murmur heard. Pulmonary:     Effort: Pulmonary effort is normal. No respiratory distress.     Breath sounds: Normal breath sounds. No wheezing.  Abdominal:     General: Abdomen is flat. Bowel sounds are normal. There is no distension.     Tenderness: There is no abdominal tenderness.  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  Xray not available. However, she is plugged in with orthopedic and is scheduled for surgery in January of 2024. I have already completed medical clearance. Need referral to a different Cards for surgery clearance. Hx of SOB on exertion. Recent ECHO and stress test on file.  Need Cards f/u prior to surgery. Referral placed to Cards for cardiology clearance.   Incomplete emptying of bladder Improved on Flomax  Skin lesion Left shin lesion Benign appearing. 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 Return if change in size or color. She agreed with the plan.    HM:  AWV appointment scheduled. I called and confirmed Shingrix with her pharmacy.  File updated.  Andrena Mews, MD [place]

## 2022-05-19 NOTE — Assessment & Plan Note (Signed)
Left shin lesion Benign appearing. 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 Return if change in size or color. She agreed with the plan.

## 2022-05-19 NOTE — Assessment & Plan Note (Addendum)
Likely DJD.  Xray not available. However, she is plugged in with orthopedic and is scheduled for surgery in January of 2024. I have already completed medical clearance. Need referral to a different Cards for surgery clearance. Hx of SOB on exertion. Recent ECHO and stress test on file.  Need Cards f/u prior to surgery. Referral placed to Cards for cardiology clearance.

## 2022-05-29 ENCOUNTER — Ambulatory Visit: Payer: Medicare Other

## 2022-05-29 VITALS — BP 131/71 | HR 75 | Temp 97.8°F | Resp 16 | Ht 63.0 in | Wt 207.0 lb

## 2022-05-29 DIAGNOSIS — I10 Essential (primary) hypertension: Secondary | ICD-10-CM | POA: Diagnosis not present

## 2022-05-29 DIAGNOSIS — E782 Mixed hyperlipidemia: Secondary | ICD-10-CM

## 2022-05-29 DIAGNOSIS — Z01810 Encounter for preprocedural cardiovascular examination: Secondary | ICD-10-CM | POA: Diagnosis not present

## 2022-05-29 NOTE — Progress Notes (Signed)
Primary Physician/Referring:  [name], MD  Patient ID: [name], female    DOB: July 11, 1946, 76 y.o.   MRN: [id number]  Chief Complaint  Patient presents with   Follow-up   Pre-op Exam   HPI:    Angie [name]  is a 76 y.o. AA female with hypertension, hyperlipidemia, prediabetes, 15 year pack history (quit 1990), family history of premature CAD (sister with MI in 68s).  Patient also has history of arthritis and vertigo.  She is referred to our office for preoperative risk stratification prior to right total hip arthroplasty at the end of this month.  Patient presents for pre-operative cardiovascular clearance for total knee arthoplasty. Previous echocardiogram and stress test reviewed.  Patient reports she is feeling well overall without specific complaints.  She does have mild dyspnea with exertion when climbing steps but this is not new or progressively worse. Denies chest pain, palpitations, syncope, near syncope, orthopnea, PND, leg edema.  She is planning to undergo knee arthoplasty in January 2024.  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  Never received treatment.  No signs of active disease and low risk for reactivation.  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  Patient declines referral to health department to discuss at this time.    Pre-diabetes    Past Surgical History:  Procedure Laterality Date   CARPAL TUNNEL RELEASE Bilateral 2012   CHOLECYSTECTOMY  1982   MAXIMUM ACCESS (MAS)POSTERIOR LUMBAR INTERBODY FUSION (PLIF) 2  LEVEL N/A 08/08/2018   Procedure: Decompression and  Fusion of Lumbar Four-Five Lumbar Five-Sacral One, Pedicle Screws of Lumbar Four- Lumbar Five, Lumbar Five- Sacral One, Lumbar Four-Lumbar Five Interbodies;  Surgeon: Erline Levine, MD;  Location: [facility];  Service: Neurosurgery;  Laterality: N/A;  Decompression and  Fusion of Lumbar Four-Five Lumbar Five-Sacral One, Pedicle Screws of Lumbar Four- Lumbar Five, Lumbar Fiv   ROTATOR CUFF REPAIR Bilateral 2009   TOTAL HIP ARTHROPLASTY Right 08/22/2021   Procedure: TOTAL HIP ARTHROPLASTY;  Surgeon: Willaim Sheng, MD;  Location: [facility];  Service: Orthopedics;  Laterality: Right;   TUBAL LIGATION     Family History  Problem Relation Age of Onset   Diabetes Mother    Stroke Mother    Hypertension Mother    Diabetes Father    Kidney disease Father    Heart attack Father    Diabetes Sister    Heart disease Sister    Heart attack Sister    Breast cancer Sister        18s   Diabetes Sister    Gout Sister    Alcohol abuse Brother     Social History   Tobacco Use   Smoking status: Former    Packs/day: 0.50    Years: 30.00    Total pack years: 15.00    Types: Cigarettes    Quit date: 07/24/1993    Years since quitting: 28.8   Smokeless tobacco: Never  Substance Use Topics   Alcohol use: No  Marital Status: Married   ROS  Review of Systems  Constitutional: Negative for malaise/fatigue and weight gain.  Cardiovascular:  Positive for dyspnea on exertion (mild, stable). Negative for chest pain, claudication, leg swelling, near-syncope, orthopnea, palpitations, paroxysmal nocturnal dyspnea and syncope.  Neurological:  Negative for dizziness.   Objective   Blood pressure 131/71, pulse 75, temperature 97.8 F (36.6 C), temperature source Temporal, resp. rate 16, height '5\' 3"'$  (1.6 m), weight 207 lb (93.9 kg), SpO2 97 %.     05/29/2022    2:29 PM 05/19/2022   10:11 AM 04/18/2022   10:31 AM  Vitals with BMI  Height '5\' 3"'$  '5\' 3"'$  '5\' 2"'$    Weight 207 lbs  204 lbs  BMI 96.75  91.6  Systolic 384 665 993  Diastolic 71 70 72  Pulse 75 85 81      Physical Exam Vitals reviewed.  Constitutional:      Appearance: She is obese.  HENT:     Head: Normocephalic and atraumatic.  Cardiovascular:     Rate and Rhythm: Normal rate and regular rhythm.     Pulses: Intact distal pulses.          Carotid pulses are 2+ on the right side and 2+ on the left side.      Radial pulses are 2+ on the right side and 2+ on the left side.       Femoral pulses are 1+ on the right side and 1+ on the left side.      Popliteal pulses are 1+ on the right side and 1+ on the left side.       Dorsalis pedis pulses are 2+ on the right side and 2+ on the left side.       Posterior tibial pulses are 2+ on the right side and 2+ on the left side.     Heart sounds: S1 normal and S2 normal. No murmur heard.    No gallop.  Pulmonary:     Effort: Pulmonary effort is normal. No respiratory distress.     Breath sounds: No wheezing, rhonchi or rales.  Musculoskeletal:     Right lower leg: No edema.     Left lower leg: No edema.  Neurological:     Mental Status: She is alert.     Laboratory examination:   Recent Labs    07/27/21 1120 08/15/21 1031 08/23/21 0328  NA 142 140 137  K 4.0 3.4* 3.6  CL 102 102 101  CO2 '29 31 28  '$ GLUCOSE 106* 91 110*  BUN '20 16 12  '$ CREATININE 0.73 0.48 0.63  CALCIUM 10.1 9.9 9.2  GFRNONAA  --  >60 >60   CrCl cannot be calculated (Patient's most recent lab result is older than the maximum 21 days allowed.).     Latest Ref Rng & Units 08/23/2021    3:28 AM 08/15/2021   10:31 AM 07/27/2021   11:20 AM  CMP  Glucose 70 - 99 mg/dL 110  91  106   BUN 8 - 23 mg/dL '12  16  20   '$ Creatinine 0.44 - 1.00 mg/dL 0.63  0.48  0.73   Sodium 135 - 145 mmol/L 137  140  142   Potassium 3.5 - 5.1 mmol/L 3.6  3.4  4.0   Chloride 98 - 111 mmol/L 101  102  102   CO2 22 - 32 mmol/L '28  31  29   '$ Calcium 8.9 - 10.3 mg/dL 9.2  9.9  10.1  Latest Ref Rng & Units 08/23/2021    3:28 AM 08/15/2021   10:31 AM 07/27/2021   11:20 AM  CBC  WBC 4.0 - 10.5 K/uL 14.1  7.1  7.5   Hemoglobin 12.0 - 15.0 g/dL 12.0  14.0  13.5   Hematocrit 36.0 - 46.0 % 35.3  42.1  41.2   Platelets 150 - 400 K/uL 173  197  223     Lipid Panel Recent Labs    07/27/21 1120  CHOL 197  TRIG 146  LDLCALC 113*  HDL 58  CHOLHDL 3.4    HEMOGLOBIN A1C Lab Results  Component Value Date   HGBA1C 5.9 04/18/2022   MPG 131 08/15/2021   TSH No results for input(s): "TSH" in the last 8760 hours.  External labs:   None   Allergies  No Known Allergies   Medications Prior to Visit:   Outpatient Medications Prior to Visit  Medication Sig Dispense Refill   clobetasol cream (TEMOVATE) 1.76 % Apply 1 application topically 2 (two) times daily. (Patient taking differently: Apply 1 application  topically as needed.) 60 g 1   cycloSPORINE (RESTASIS) 0.05 % ophthalmic emulsion Place 1 drop into both eyes 2 (two) times daily.     diclofenac Sodium (VOLTAREN) 1 % GEL Apply 2 g topically 2 (two) times daily as needed (pain). 100 g 1   docusate sodium (COLACE) 100 MG capsule Take 1 capsule (100 mg total) by mouth 2 (two) times daily. (Patient taking differently: Take 100 mg by mouth daily as needed.) 10 capsule 0   famotidine (PEPCID) 40 MG tablet Take 1 tablet (40 mg total) by mouth daily. 90 tablet 1   hydrochlorothiazide (HYDRODIURIL) 25 MG tablet Take 1 tablet (25 mg total) by mouth daily. 90 tablet 1   levocetirizine (XYZAL) 5 MG tablet Take 1 tablet (5 mg total) by mouth every evening. 90 tablet 1   melatonin 3 MG TABS tablet Take 3 mg by mouth at bedtime.     mometasone (NASONEX) 50 MCG/ACT nasal spray Place 2 sprays into the nose daily. 3 each 1   Multiple Vitamin (MULTIVITAMIN) capsule Take 1 capsule by mouth daily.     OVER THE COUNTER MEDICATION Take 1 tablet by mouth daily. Calcium '500mg'$ , Magnesium '500mg'$ , Potassium '99mg'$      pravastatin  (PRAVACHOL) 40 MG tablet Take 1 tablet (40 mg total) by mouth daily. 90 tablet 1   pregabalin (LYRICA) 100 MG capsule Take 1 capsule (100 mg total) by mouth 2 (two) times daily. 180 capsule 1   SUMAtriptan (IMITREX) 25 MG tablet Take 1 tab for Migraine headache. May repeat dose 2-3 hrs later if there is no improvement, not more than 100 mg per day. 10 tablet 2   tamsulosin (FLOMAX) 0.4 MG CAPS capsule Take 1 capsule (0.4 mg total) by mouth daily. 90 capsule 0   tiZANidine (ZANAFLEX) 4 MG tablet Take 1 tablet (4 mg total) by mouth at bedtime as needed for muscle spasms. 30 tablet 0   traMADol (ULTRAM) 50 MG tablet Take 1-2 tablets (50-100 mg total) by mouth every 6 (six) hours as needed for severe pain or moderate pain. 30 tablet 0   valACYclovir (VALTREX) 500 MG tablet Take 1 tablet (500 mg total) by mouth daily. 90 tablet 1   NON FORMULARY Take 1 tablet by mouth in the morning and at bedtime. Cognium     No facility-administered medications prior to visit.   Final Medications at End of Visit  Current Meds  Medication Sig   clobetasol cream (TEMOVATE) 2.95 % Apply 1 application topically 2 (two) times daily. (Patient taking differently: Apply 1 application  topically as needed.)   cycloSPORINE (RESTASIS) 0.05 % ophthalmic emulsion Place 1 drop into both eyes 2 (two) times daily.   diclofenac Sodium (VOLTAREN) 1 % GEL Apply 2 g topically 2 (two) times daily as needed (pain).   docusate sodium (COLACE) 100 MG capsule Take 1 capsule (100 mg total) by mouth 2 (two) times daily. (Patient taking differently: Take 100 mg by mouth daily as needed.)   famotidine (PEPCID) 40 MG tablet Take 1 tablet (40 mg total) by mouth daily.   hydrochlorothiazide (HYDRODIURIL) 25 MG tablet Take 1 tablet (25 mg total) by mouth daily.   levocetirizine (XYZAL) 5 MG tablet Take 1 tablet (5 mg total) by mouth every evening.   melatonin 3 MG TABS tablet Take 3 mg by mouth at bedtime.   mometasone (NASONEX) 50 MCG/ACT  nasal spray Place 2 sprays into the nose daily.   Multiple Vitamin (MULTIVITAMIN) capsule Take 1 capsule by mouth daily.   OVER THE COUNTER MEDICATION Take 1 tablet by mouth daily. Calcium '500mg'$ , Magnesium '500mg'$ , Potassium '99mg'$    pravastatin (PRAVACHOL) 40 MG tablet Take 1 tablet (40 mg total) by mouth daily.   pregabalin (LYRICA) 100 MG capsule Take 1 capsule (100 mg total) by mouth 2 (two) times daily.   SUMAtriptan (IMITREX) 25 MG tablet Take 1 tab for Migraine headache. May repeat dose 2-3 hrs later if there is no improvement, not more than 100 mg per day.   tamsulosin (FLOMAX) 0.4 MG CAPS capsule Take 1 capsule (0.4 mg total) by mouth daily.   tiZANidine (ZANAFLEX) 4 MG tablet Take 1 tablet (4 mg total) by mouth at bedtime as needed for muscle spasms.   traMADol (ULTRAM) 50 MG tablet Take 1-2 tablets (50-100 mg total) by mouth every 6 (six) hours as needed for severe pain or moderate pain.   valACYclovir (VALTREX) 500 MG tablet Take 1 tablet (500 mg total) by mouth daily.   Radiology:   No results found.  Cardiac Studies:   Lexiscan Sestamibi stress test 08/01/2021: 1 Day Rest/Stress Protocol. Stress EKG is non-diagnostic, as this is pharmacological stress test using Lexiscan. Normal myocardial perfusion without convincing evidence of reversible myocardial ischemia or prior infarct. Left ventricular size normal, no obvious regional wall motion abnormalities, calculated LVEF 78%. Low risk study.  Echocardiogram 08/01/2021:   1. Left ventricular ejection fraction, by estimation, is 60 to 65%. The  left ventricle has normal function. The left ventricle has no regional  wall motion abnormalities. Left ventricular diastolic parameters are  consistent with Grade I diastolic  dysfunction (impaired relaxation). The average left ventricular global  longitudinal strain is -20.3 %. The global longitudinal strain is normal.   2. Right ventricular systolic function is normal. The right  ventricular  size is normal. Tricuspid regurgitation signal is inadequate for assessing  PA pressure.   3. The mitral valve is normal in structure. No evidence of mitral valve  regurgitation. No evidence of mitral stenosis.   4. The aortic valve is tricuspid. Aortic valve regurgitation is not  visualized. No aortic stenosis is present.   5. The inferior vena cava is normal in size with greater than 50%  respiratory variability, suggesting right atrial pressure of 3 mmHg.  Comparison(s): No significant change from prior study.   EKG:   EKG 05/29/2022: Normal sinus rhythm at rate of 70 bpm.  Normal axis.  Left atrial enlargement.  Left anterior fascicular block.  Compared to previous EKG on 07/29/2021, no significant change.  Assessment     ICD-10-CM   1. Preoperative cardiovascular examination  Z01.810 EKG 12-Lead    2. Primary hypertension  I10     3. Mixed hyperlipidemia  E78.2        Medications Discontinued During This Encounter  Medication Reason   NON FORMULARY      No orders of the defined types were placed in this encounter.   Recommendations:   Dezirae [name] is a 76 y.o. AA female with hypertension, hyperlipidemia, prediabetes, 15 year pack history (quit 1990), family history of premature CAD (sister with MI in 32s).  Patient also has history of arthritis and vertigo.  She is referred to our office for preoperative risk stratification prior to right total hip arthroplasty at the end of this month.  Preoperative cardiovascular examination Patient is stable from a cardiovascular standpoint and low risk for total knee arthroplasty. Will send surgical clearance for orthopedic surgery.  Primary hypertension Blood pressure is well controlled.  She is currently on hydrochlorothiazide 25 mg daily without issues.  No changes to current medications. Discussed importance of lifestyle modifications including diet, exercise as tolerated, and weight loss. Advised low-sodium  diet less than 2000 mg daily.  Mixed hyperlipidemia Reviewed previous labs, LDL is 113.  She continues on pravastatin 40 mg daily.  Lipids are managed by PCP.  Follow-up on as-needed basis.    Ernst Spell, AGNP-C 05/29/2022, 3:14 PM Office: [phone]

## 2022-06-05 ENCOUNTER — Telehealth: Payer: Self-pay

## 2022-06-05 NOTE — Telephone Encounter (Signed)
Patient calls nurse line in regards to apt scheduled for 11/17.  Patient reports she was unaware of apt. She reports she does not remember making this. She saw this apt on mychart.   Patient would prefer not to come in as she reports just seeing PCP. She denies any new concerns.   Will forward to PCP.  If apt is not necessary I will call her to cancel.

## 2022-06-05 NOTE — Telephone Encounter (Signed)
Discussed with the patient. I reminded her of the AWV appointment. She plan on keeping this appointment. She was appreciative of the call.

## 2022-06-08 ENCOUNTER — Encounter: Payer: Self-pay | Admitting: Family Medicine

## 2022-06-08 NOTE — Progress Notes (Addendum)
Subjective:   Angie [name] is a 76 y.o. female who presents for Medicare Annual (Subsequent) preventive examination.  Review of Systems    Memory issues. She will forget things and words in the moment, but will remember after a few minutes or after thinking.       Objective:    Today's Vitals   06/08/22 1604  BP: 134/68  Pulse: 79  SpO2: 100%  Weight: 207 lb 6.4 oz (94.1 kg)  Height: '5\' 3"'$  (1.6 m)  PainSc: 0-No pain   Body mass index is 36.74 kg/m.  Physical Exam Vitals and nursing note reviewed.  HENT:     Head: Normocephalic.     Right Ear: Tympanic membrane normal.     Left Ear: Tympanic membrane normal.  Eyes:     Extraocular Movements: Extraocular movements intact.     Pupils: Pupils are equal, round, and reactive to light.  Cardiovascular:     Rate and Rhythm: Normal rate and regular rhythm.     Heart sounds: Normal heart sounds. No murmur heard. Pulmonary:     Effort: Pulmonary effort is normal. No respiratory distress.     Breath sounds: Normal breath sounds. No wheezing.  Abdominal:     General: Bowel sounds are normal. There is no distension.     Palpations: Abdomen is soft.     Tenderness: There is no abdominal tenderness.  Musculoskeletal:     Right lower leg: No edema.     Left lower leg: No edema.  Neurological:     Mental Status: She is oriented to person, place, and time.  Psychiatric:        Behavior: Behavior normal.        Thought Content: Thought content normal.        06/09/2022   10:41 AM 05/19/2022   10:10 AM 04/18/2022   10:33 AM 03/23/2022   11:51 AM 01/03/2022    8:36 AM 08/22/2021    3:15 PM 08/15/2021   10:10 AM  Advanced Directives  Does Patient Have a Medical Advance Directive? No No No Yes No Yes Yes  Type of Teacher, early years/pre;Living will [place];Living will  Does patient want to make changes to medical advance directive?      No - Patient declined   Copy of  [place] in Chart?      No - copy requested   Would patient like information on creating a medical advance directive? No - Patient declined No - Patient declined No - Patient declined  No - Patient declined      Current Medications (verified) Outpatient Encounter Medications as of 06/09/2022  Medication Sig   clobetasol cream (TEMOVATE) 1.30 % Apply 1 application topically 2 (two) times daily. (Patient taking differently: Apply 1 application  topically as needed.)   cycloSPORINE (RESTASIS) 0.05 % ophthalmic emulsion Place 1 drop into both eyes 2 (two) times daily.   diclofenac Sodium (VOLTAREN) 1 % GEL Apply 2 g topically 2 (two) times daily as needed (pain).   docusate sodium (COLACE) 100 MG capsule Take 1 capsule (100 mg total) by mouth 2 (two) times daily. (Patient taking differently: Take 100 mg by mouth daily as needed.)   famotidine (PEPCID) 40 MG tablet Take 1 tablet (40 mg total) by mouth daily.   hydrochlorothiazide (HYDRODIURIL) 25 MG tablet Take 1 tablet (25 mg total) by mouth daily.   levocetirizine (XYZAL) 5 MG tablet Take  1 tablet (5 mg total) by mouth every evening.   melatonin 3 MG TABS tablet Take 3 mg by mouth at bedtime.   mometasone (NASONEX) 50 MCG/ACT nasal spray Place 2 sprays into the nose daily.   Multiple Vitamin (MULTIVITAMIN) capsule Take 1 capsule by mouth daily.   OVER THE COUNTER MEDICATION Take 1 tablet by mouth daily. Calcium '500mg'$ , Magnesium '500mg'$ , Potassium '99mg'$    pravastatin (PRAVACHOL) 40 MG tablet Take 1 tablet (40 mg total) by mouth daily.   pregabalin (LYRICA) 100 MG capsule Take 1 capsule (100 mg total) by mouth 2 (two) times daily.   SUMAtriptan (IMITREX) 25 MG tablet Take 1 tab for Migraine headache. May repeat dose 2-3 hrs later if there is no improvement, not more than 100 mg per day.   tamsulosin (FLOMAX) 0.4 MG CAPS capsule Take 1 capsule (0.4 mg total) by mouth daily.   tiZANidine (ZANAFLEX) 4 MG tablet Take 1 tablet (4 mg  total) by mouth at bedtime as needed for muscle spasms.   traMADol (ULTRAM) 50 MG tablet Take 1-2 tablets (50-100 mg total) by mouth every 6 (six) hours as needed for severe pain or moderate pain.   valACYclovir (VALTREX) 500 MG tablet Take 1 tablet (500 mg total) by mouth daily.   No facility-administered encounter medications on file as of 06/09/2022.    Allergies (verified) Patient has no known allergies.   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  No signs of active disease and low risk for reactivation.  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  Patient declines referral to health department to discuss at this time.    Pre-diabetes    Past Surgical History:  Procedure Laterality Date   CARPAL TUNNEL RELEASE Bilateral 2012   CHOLECYSTECTOMY  1982   MAXIMUM ACCESS (MAS)POSTERIOR LUMBAR INTERBODY FUSION (PLIF) 2 LEVEL N/A 08/08/2018   Procedure: Decompression and  Fusion of Lumbar Four-Five Lumbar Five-Sacral One, Pedicle Screws of Lumbar Four- Lumbar Five, Lumbar Five- Sacral One, Lumbar Four-Lumbar Five Interbodies;  Surgeon: Erline Levine, MD;  Location: [facility];  Service: Neurosurgery;  Laterality: N/A;  Decompression and  Fusion of Lumbar Four-Five Lumbar Five-Sacral One, Pedicle Screws of Lumbar Four- Lumbar Five, Lumbar Fiv   ROTATOR CUFF REPAIR Bilateral 2009   TOTAL HIP ARTHROPLASTY Right 08/22/2021   Procedure: TOTAL HIP ARTHROPLASTY;  Surgeon: Willaim Sheng, MD;  Location: [facility];  Service: Orthopedics;  Laterality:  Right;   TUBAL LIGATION     Family History  Problem Relation Age of Onset   Diabetes Mother    Stroke Mother    Hypertension Mother    Diabetes Father    Kidney disease Father    Heart attack Father    Diabetes Sister    Heart disease Sister    Heart attack Sister    Breast cancer Sister        75s   Diabetes Sister    Gout Sister    Alcohol abuse Brother  Social History   Socioeconomic History   Marital status: Married    Spouse name: Not on file   Number of children: Not on file   Years of education: Not on file   Highest education level: Not on file  Occupational History   Not on file  Tobacco Use   Smoking status: Former    Packs/day: 0.50    Years: 30.00    Total pack years: 15.00    Types: Cigarettes    Quit date: 07/24/1993    Years since quitting: 28.8   Smokeless tobacco: Never  Vaping Use   Vaping Use: Never used  Substance and Sexual Activity   Alcohol use: No   Drug use: No   Sexual activity: Not Currently  Other Topics Concern   Not on file  Social History Narrative   Current Social History 04/25/2017            Patient lives with husband in one level home 04/25/2017   Transportation: Patient has own vehicle  04/25/2017   Important Relationships God, husband and children 04/25/2017    Pets: None 04/25/2017   Education / Work:  BSN/ Retired Therapist, 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 This is an Surveyor, minerals but also can be a burden.  04/25/2017   L. Silvano Rusk, RN, BSN                                                                                                 Social Determinants of Health   Financial Resource Strain: Not on file  Food Insecurity: No Food Insecurity (06/08/2022)    Hunger Vital Sign    Worried About Running Out of Food in the Last Year: Never true    [place] in the Last Year: Never true  Transportation Needs: No Transportation Needs (06/08/2022)   PRAPARE - Hydrologist (Medical): No    Lack of Transportation (Non-Medical): No  Physical Activity: Unknown (06/08/2022)   Exercise Vital Sign    Days of Exercise per Week: 0 days    Minutes of Exercise per Session: Not on file  Stress: Not on file  Social Connections: Not on file    Tobacco Counseling Counseling given: Yes   Clinical Intake:  Pre-visit preparation completed: Yes  Pain : No/denies pain Pain Score: 0-No pain     Nutritional Status: BMI > 30  Obese  How often do you need to have someone help you when you read instructions, pamphlets, or other written materials from your doctor or pharmacy?: 1 - Never What is the last grade level you completed in school?: hs  Diabetic?PreDM  Interpreter Needed?: No      Activities of Daily Living    06/08/2022    4:15 PM 08/22/2021    3:19 PM  In your present state of health, do you have any difficulty performing the following activities:  Hearing? 0   Vision? 1   Comment Wears eyeglasses and need cataract removal surgery pending   Difficulty concentrating or making decisions? 1   Walking or climbing stairs? 1   Dressing or bathing? 0   Doing errands, shopping? 0 0    Patient Care Team: [name], MD as PCP - General (Family Medicine) Juanita Craver, MD as Consulting Physician (Gastroenterology) Elza Rafter, MD as Consulting Physician (Family Medicine) Almedia Balls, MD as Referring Physician (Orthopedic Surgery)  Indicate any recent Medical Services you may have received from other than [facility] providers in the past year (date may be approximate).     Assessment:   This is a routine wellness examination for [place].  Hearing/Vision screen Hearing Screening   '250Hz'$  '500Hz'$   '1000Hz'$  '2000Hz'$  '4000Hz'$   Right ear '20 20 20 20 '$ 40  Left ear '20 20 20 20 20   '$ Vision Screening   Right eye Left eye Both eyes  Without correction     With correction '20/25 20/25 20/30 '$    Dietary issues and exercise activities discussed: Current Exercise Habits: The patient does not participate in regular exercise at present, Exercise limited by: orthopedic condition(s) (Knee pain)   Goals Addressed   None    Depression Screen    06/09/2022   10:41 AM 06/08/2022    4:10 PM 05/19/2022   10:12 AM 04/18/2022   10:32 AM 01/03/2022    8:38 AM 07/27/2021   10:30 AM 02/18/2021   10:11 AM  PHQ 2/9 Scores  PHQ - 2 Score  0 0 0 0 0 0  PHQ- 9 Score  '7 2 1 3 5 5  '$ Exception Documentation Patient refusal          Fall Risk    06/09/2022   10:46 AM 06/09/2022   10:41 AM 06/08/2022    4:13 PM 05/19/2022   10:11 AM 04/18/2022   10:32 AM  Fall Risk   Falls in the past year? 1 0 1 0 0  Comment   Tripped over at home 2 months ago.    Number falls in past yr: 0 0 0 0 0  Injury with Fall? 0 0 0 0 0  Risk for fall due to :   Orthopedic patient      FALL RISK PREVENTION PERTAINING TO THE HOME:  Any stairs in or around the home? Yes  If so, are there any without handrails? Yes  Home free of loose throw rugs in walkways, pet beds, electrical cords, etc? Yes  Adequate lighting in your home to reduce risk of falls? Yes   ASSISTIVE DEVICES UTILIZED TO PREVENT FALLS:  Life alert? No  Use of a cane, walker or w/c? No  Grab bars in the bathroom? Yes  Shower chair or bench in shower? No  Elevated toilet seat or a handicapped toilet? Yes   TIMED UP AND GO:  Was the test performed? Yes .  Length of time to ambulate 10 feet: 10 sec.   Gait steady and fast without use of assistive device  Cognitive Function:      Mini-Cog - 06/09/22 1348     Normal clock drawing test? yes    How many words correct? 3            Score of 5: Passed Minicog    Immunizations Immunization History   Administered Date(s) Administered   Fluad Quad(high Dose  65+) 07/27/2021, 04/18/2022   Influenza,inj,Quad PF,6+ Mos 04/25/2017, 05/22/2018, 07/06/2020   PFIZER Comirnaty(Gray Top)Covid-19 Tri-Sucrose Vaccine 12/29/2020   PFIZER(Purple Top)SARS-COV-2 Vaccination 09/07/2019, 09/30/2019, 05/26/2020   Pfizer Covid-19 Vaccine Bivalent Booster 80yr & up 07/27/2021   Pneumococcal Conjugate-13 09/08/2014   Pneumococcal Polysaccharide-23 02/08/2016   Td 05/21/2009   Tdap 03/16/2021   Zoster Recombinat (Shingrix) 01/05/2022, 05/15/2022    TDAP status: Up to date  Flu Vaccine status: Up to date  Pneumococcal vaccine status: Up to date  Covid-19 vaccine status: Information provided on how to obtain vaccines.   Qualifies for Shingles Vaccine? Yes   Zostavax completed Yes   Shingrix Completed?: Yes  Screening Tests Health Maintenance  Topic Date Due   Medicare Annual Wellness (AWV)  04/25/2018   COVID-19 Vaccine (6 - Pfizer series) 01/19/2023 (Originally 11/24/2021)   HEMOGLOBIN A1C  10/17/2022   Fecal DNA (Cologuard)  05/04/2025   Pneumonia Vaccine 75 Years old  Completed   INFLUENZA VACCINE  Completed   DEXA SCAN  Completed   Hepatitis C Screening  Completed   Zoster Vaccines- Shingrix  Completed   HPV VACCINES  Aged Out   COLONOSCOPY (Pts 45-456yrInsurance coverage will need to be confirmed)  Discontinued    Health Maintenance  Health Maintenance Due  Topic Date Due   Medicare Annual Wellness (AWV)  04/25/2018    Colorectal cancer screening: Type of screening: Cologuard. Completed 08/04/21. Repeat every 3 years  Mammogram status: Completed 05/05/22. Repeat every year  Bone Density status: Completed 02/27/20. Results reflect: Bone density results: NORMAL. Repeat every 3-5 years.  Lung Cancer Screening: (Low Dose CT Chest recommended if Age 75-80ears, 30 pack-year currently smoking OR have quit w/in 15years.) does not qualify. Quit > 15 years ago. I advised her to let me  know if having any worrisome symptoms. She agreed with the plan.  Lung Cancer Screening Referral: No. Quit > 15 years ago. I advised her to let me know if having any worrisome symptoms. She agreed with the plan.   Additional Screening:  Hepatitis C Screening: Does qualify; Completed 06/22/15  Vision Screening: Recommended annual ophthalmology exams for early detection of glaucoma and other disorders of the eye. Is the patient up to date with their annual eye exam?  Yes  Who is the provider or what is the name of the office in which the patient attends annual eye exams? Wears eyeglasses If pt is not established with a provider, would they like to be referred to a provider to establish care? N/A.   Dental Screening: Recommended annual dental exams for proper oral hygiene  Community Resource Referral / Chronic Care Management: CRR required this visit?  No   CCM required this visit?  No      Plan:     I have personally reviewed and noted the following in the patient's chart:   Medical and social history Use of alcohol, tobacco or illicit drugs  Current medications and supplements including opioid prescriptions. Patient is not currently taking opioid prescriptions. Functional ability and status Nutritional status Physical activity Advanced directives - already completed List of other physicians Hospitalizations, surgeries, and ER visits in previous 12 months Vitals Screenings to include cognitive, depression, and falls Referrals and appointments  In addition, I have reviewed and discussed with patient certain preventive protocols, quality metrics, and best practice recommendations. A written personalized care plan for preventive services as well as general preventive health recommendations were provided to patient.    KeAndrena MewsMD  06/09/2022         

## 2022-06-09 ENCOUNTER — Ambulatory Visit (INDEPENDENT_AMBULATORY_CARE_PROVIDER_SITE_OTHER): Payer: Medicare Other | Admitting: Family Medicine

## 2022-06-09 ENCOUNTER — Encounter: Payer: Self-pay | Admitting: *Deleted

## 2022-06-09 ENCOUNTER — Encounter: Payer: Self-pay | Admitting: Family Medicine

## 2022-06-09 VITALS — BP 134/68 | HR 79 | Ht 63.0 in | Wt 207.4 lb

## 2022-06-09 DIAGNOSIS — Z0000 Encounter for general adult medical examination without abnormal findings: Secondary | ICD-10-CM | POA: Diagnosis not present

## 2022-06-09 NOTE — Patient Instructions (Signed)
Lung Cancer Screening A lung cancer screening is a test that checks for lung cancer when there are no symptoms or history of that disease. The screening is done to look for lung cancer in its very early stages. Finding cancer early improves the chances of successful treatment. It may save your life. Who should have a screening? You should be screened for lung cancer if all of these apply: You currently smoke, or you have quit smoking within the past 15 years. You are between the ages of 50 and 80 years old. Screening may be recommended up to age 80 depending on your overall health and other factors. You have a smoking history of 1 pack of cigarettes a day for 20 years or 2 packs a day for 10 years. How is screening done?  The recommended screening test is a low-dose computed tomography (LDCT) scan. This scan takes detailed images of the lungs. This allows a health care provider to look for abnormal cells. If you are at risk for lung cancer, it is recommended that you get screened once a year. Talk to your health care provider about the risks, benefits, and limitations of screening. What are the benefits of screening? Screening can find lung cancer early, before symptoms start and before it has spread outside of the lungs. The chances of curing lung cancer are greater if the cancer is diagnosed early. What are the risks of screening? The screening may show lung cancer when no cancer is present. Talk with your health care provider about what your results mean. In some cases, your health care provider may do more testing to confirm the results. The screening may not find lung cancer when it is present. You will be exposed to radiation from repeated LDCT tests, which can cause cancer in otherwise healthy people. How can I lower my risk of lung cancer? Make these lifestyle changes to lower your risk of developing lung cancer: Do not use any products that contain nicotine or tobacco. These products  include cigarettes, chewing tobacco, and vaping devices, such as e-cigarettes. If you need help quitting, ask your health care provider. Avoid secondhand smoke. Avoid exposure to radiation. Avoid exposure to radon gas. Have your home checked for radon regularly. Avoid things that cause cancer (carcinogens). Avoid living or working in places with high air pollution or diesel exhaust. Questions to ask your health care provider Am I eligible for lung cancer screening? Does my health insurance cover the cost of lung cancer screening? What happens if the lung cancer screening shows something of concern? How soon will I have results from my lung cancer screening? Is there anything that I need to do to prepare for my lung cancer screening? What happens if I decide not to have lung cancer screening? Where to find more information Ask your health care provider about the risks and benefits of screening. More information and resources are available from these organizations: American Cancer Society (ACS): www.cancer.org American Lung Association: www.lung.org National Cancer Institute: www.cancer.gov Contact a health care provider if: You start to show symptoms of lung cancer, including: A cough that will not go away. High-pitched whistling sounds when you breathe, most often when you breathe out (wheezing). Chest pain. Coughing up blood. Shortness of breath. Weight loss that cannot be explained. Constant tiredness (fatigue). Hoarse voice. Summary Lung cancer screening may find lung cancer before symptoms appear. Finding cancer early improves the chances of successful treatment. It may save your life. The recommended screening test is a low-dose   computed tomography (LDCT) scan that looks for abnormal cells in the lungs. If you are at risk for lung cancer, it is recommended that you get screened once a year. You can make lifestyle changes to lower your risk of lung cancer. Ask your health care  provider about the risks and benefits of screening. This information is not intended to replace advice given to you by your health care provider. Make sure you discuss any questions you have with your health care provider. Document Revised: 12/29/2020 Document Reviewed: 12/29/2020 Elsevier Patient Education  2023 Elsevier Inc.  

## 2022-06-09 NOTE — Progress Notes (Signed)
Pt has established PCP - Dr. Gwendlyn Deutscher at [facility] and has seen her multiple times since the 01/28/22 event. Last A1C was 5.9 on 04/18/22. No further health equity support indicated at this time.

## 2022-06-21 ENCOUNTER — Ambulatory Visit: Payer: Self-pay

## 2022-06-22 NOTE — Patient Instructions (Signed)
Visit Information  Thank you for taking time to visit with me today. Please don't hesitate to contact me if I can be of assistance to you.   Following are the goals we discussed today:   Goals Addressed             This Visit's Progress    I want to stay healthy and manage my health       Care Coordination Interventions:  Active listening / Reflection utilized  Emotional Support Provided Evaluation of current treatment plan and patient's adherence to plan as established by provider Reviewed scheduled/upcoming provider appointments  Discussed plans with patient for ongoing care management follow up and provided patient with direct contact information for care management team          Our next appointment is by telephone on 08/22/22 at 11 am  Please call the care guide team at [phone] if you need to cancel or reschedule your appointment.   If you are experiencing a Mental Health or [place] or need someone to talk to, please call 1-800-273-TALK (toll free, 24 hour hotline)  Patient verbalizes understanding of instructions and care plan provided today and agrees to view in [place]. Active MyChart status and patient understanding of how to access instructions and care plan via MyChart confirmed with patient.     Lazaro Arms RN, BSN, [facility]   Phone: [phone]

## 2022-06-22 NOTE — Patient Outreach (Signed)
  Care Coordination   Follow Up Visit Note   06/21/2022 Name: Angie [name] MRN: [id number] DOB: 1945-10-10  Angie [name] is a 76 y.o. year old female who sees [name], MD for primary care. I spoke with  Angie [name] by phone today.  What matters to the patients health and wellness today?  Mrs. Angie [name], she seems to be doing fine, however, she is experiencing some issues with her left knee and is scheduled to have a knee replacement surgery soon. She mentioned that her knee would give out from under her, so she's being cautious with her movements. It's been a while since she went to the gym or went for a walk. I advised her to use her cane to stabilize herself and prevent falls until she has her surgery. She acknowledged my advice and seemed to understand.     Goals Addressed             This Visit's Progress    I want to stay healthy and manage my health       Care Coordination Interventions:  Active listening / Reflection utilized  Emotional Support Provided Evaluation of current treatment plan and patient's adherence to plan as established by provider Reviewed scheduled/upcoming provider appointments  Discussed plans with patient for ongoing care management follow up and provided patient with direct contact information for care management team          SDOH assessments and interventions completed:  No     Care Coordination Interventions:  Yes, provided   Follow up plan: Follow up call scheduled for 08/22/22 11 am    Encounter Outcome:  Pt. Visit Completed   Lazaro Arms RN, BSN, [facility]   Phone: [phone]

## 2022-07-26 DIAGNOSIS — M25562 Pain in left knee: Secondary | ICD-10-CM | POA: Diagnosis not present

## 2022-07-26 NOTE — H&P (Addendum)
KNEE ARTHROPLASTY ADMISSION H&P  Patient ID: Angie [name] MRN: [id number] DOB/AGE: 76-29-1947 76 y.o.  Chief Complaint: left knee pain.  Planned Procedure Date: 08/16/22 Medical Clearance by Dr. Gwendlyn Deutscher Cardiac Clearance by B. Nancie Neas, NP   HPI: Angie [name] is a 77 y.o. female who presents for evaluation of OA LEFT KNEE. The patient has a history of pain and functional disability in the left knee due to arthritis and has failed non-surgical conservative treatments for greater than 12 weeks to include NSAID's and/or analgesics, corticosteriod injections, and activity modification.  Onset of symptoms was gradual, starting 2 years ago with gradually worsening course since that time. The patient noted no past surgery on the left knee.  Patient currently rates pain at 3 out of 10 with activity. Patient has worsening of pain with activity and weight bearing and pain that interferes with activities of daily living.  Patient has evidence of joint space narrowing by imaging studies.  There is no active infection.  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  Never received treatment.  No signs of active disease and low risk for reactivation.  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  Patient declines referral to health department to discuss at this time.    Pre-diabetes    Past Surgical History:  Procedure Laterality Date   CARPAL TUNNEL RELEASE Bilateral 2012   CHOLECYSTECTOMY  1982   MAXIMUM ACCESS (MAS)POSTERIOR LUMBAR INTERBODY FUSION (PLIF) 2 LEVEL N/A  08/08/2018   Procedure: Decompression and  Fusion of Lumbar Four-Five Lumbar Five-Sacral One, Pedicle Screws of Lumbar Four- Lumbar Five, Lumbar Five- Sacral One, Lumbar Four-Lumbar Five Interbodies;  Surgeon: Erline Levine, MD;  Location: [facility];  Service: Neurosurgery;  Laterality: N/A;  Decompression and  Fusion of Lumbar Four-Five Lumbar Five-Sacral One, Pedicle Screws of Lumbar Four- Lumbar Five, Lumbar Fiv   ROTATOR CUFF REPAIR Bilateral 2009   TOTAL HIP ARTHROPLASTY Right 08/22/2021   Procedure: TOTAL HIP ARTHROPLASTY;  Surgeon: Willaim Sheng, MD;  Location: [facility];  Service: Orthopedics;  Laterality: Right;   TUBAL LIGATION     No Known Allergies Prior to Admission medications   Medication Sig Start Date End Date Taking? Authorizing Provider  clobetasol cream (TEMOVATE) 3.00 % Apply 1 application topically 2 (two) times daily. Patient taking differently: Apply 1 application  topically as needed. 09/24/19   [name], MD  cycloSPORINE (RESTASIS) 0.05 % ophthalmic emulsion Place 1 drop into both eyes 2 (two) times daily.    [provider]  diclofenac Sodium (VOLTAREN) 1 % GEL Apply 2 g topically 2 (two) times daily as needed (pain). 01/03/22   [name], MD  docusate sodium (COLACE) 100 MG capsule Take 1 capsule (100 mg total) by mouth 2 (two) times daily. Patient taking differently: Take 100 mg by mouth daily as needed. 08/10/18   Judith Part, MD  famotidine (PEPCID) 40 MG tablet Take 1 tablet (40 mg total) by mouth daily. 01/03/22   [name], MD  hydrochlorothiazide (  HYDRODIURIL) 25 MG tablet Take 1 tablet (25 mg total) by mouth daily. 01/03/22   [name], MD  levocetirizine (XYZAL) 5 MG tablet Take 1 tablet (5 mg total) by mouth every evening. 04/18/22   [name], MD  melatonin 3 MG TABS tablet Take 3 mg by mouth at bedtime.    [provider]  mometasone (NASONEX) 50 MCG/ACT nasal spray Place 2 sprays into the nose  daily. 04/18/22   [name], MD  Multiple Vitamin (MULTIVITAMIN) capsule Take 1 capsule by mouth daily.    [provider]  OVER THE COUNTER MEDICATION Take 1 tablet by mouth daily. Calcium '500mg'$ , Magnesium '500mg'$ , Potassium '99mg'$     [provider]  pravastatin (PRAVACHOL) 40 MG tablet Take 1 tablet (40 mg total) by mouth daily. 01/03/22   [name], MD  pregabalin (LYRICA) 100 MG capsule Take 1 capsule (100 mg total) by mouth 2 (two) times daily. 01/03/22   [name], MD  SUMAtriptan (IMITREX) 25 MG tablet Take 1 tab for Migraine headache. May repeat dose 2-3 hrs later if there is no improvement, not more than 100 mg per day. 01/03/22   [name], MD  tamsulosin (FLOMAX) 0.4 MG CAPS capsule Take 1 capsule (0.4 mg total) by mouth daily. 04/18/22   [name], MD  tiZANidine (ZANAFLEX) 4 MG tablet Take 1 tablet (4 mg total) by mouth at bedtime as needed for muscle spasms. 01/03/22   [name], MD  traMADol (ULTRAM) 50 MG tablet Take 1-2 tablets (50-100 mg total) by mouth every 6 (six) hours as needed for severe pain or moderate pain. 08/23/21   Willaim Sheng, MD  valACYclovir (VALTREX) 500 MG tablet Take 1 tablet (500 mg total) by mouth daily. 01/03/22   [name], MD   Social History   Socioeconomic History   Marital status: Married    Spouse name: Not on file   Number of children: Not on file   Years of education: Not on file   Highest education level: Not on file  Occupational History   Not on file  Tobacco Use   Smoking status: Former    Packs/day: 0.50    Years: 30.00    Total pack years: 15.00    Types: Cigarettes    Quit date: 07/24/1993    Years since quitting: 29.0   Smokeless tobacco: Never  Vaping Use   Vaping Use: Never used  Substance and Sexual Activity   Alcohol use: No   Drug use: No   Sexual activity: Not Currently  Other Topics Concern   Not on file  Social History Narrative   Current Social  History 04/25/2017            Patient lives with husband in one level home 04/25/2017   Transportation: Patient has own vehicle  04/25/2017   Important Relationships God, husband and children 04/25/2017    Pets: None 04/25/2017   Education / Work:  BSN/ Retired Therapist, 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 This is an Surveyor, minerals but also can be a burden.  04/25/2017   L. Silvano Rusk, RN, BSN  Social Determinants of Health   Financial Resource Strain: Not on file  Food Insecurity: No Food Insecurity (06/08/2022)   Hunger Vital Sign    Worried About Running Out of Food in the Last Year: Never true    Ran Out of Food in the Last Year: Never true  Transportation Needs: No Transportation Needs (06/08/2022)   PRAPARE - Hydrologist (Medical): No    Lack of Transportation (Non-Medical): No  Physical Activity: Unknown (06/08/2022)   Exercise Vital Sign    Days of Exercise per Week: 0 days    Minutes of Exercise per Session: Not on file  Stress: Not on file  Social Connections: Not on file   Family History  Problem Relation Age of Onset   Diabetes Mother    Stroke Mother    Hypertension Mother    Diabetes Father    Kidney disease Father    Heart attack Father    Diabetes Sister    Heart disease Sister    Heart attack Sister    Breast cancer Sister        74s   Diabetes Sister    Gout Sister    Alcohol abuse Brother     ROS: Currently denies lightheadedness, dizziness, Fever, chills, CP, SOB.   No personal history of DVT, PE, MI, or CVA. No loose teeth or dentures All other systems have been  reviewed and were otherwise currently negative with the exception of those mentioned in the HPI and as above.  Objective: Vitals: Ht: '5\' 3"'$  Wt: 209.4 lbs Temp: 98.6 BP: 133/74 Pulse: 86 O2 95% on room air.   Physical Exam: General: Alert, NAD.  Antalgic Gait  HEENT: EOMI, Good Neck Extension  Pulm: No increased work of breathing.  Clear B/L A/P w/o crackle or wheeze.  CV: RRR, No m/g/r appreciated  GI: soft, NT, ND Neuro: Neuro without gross focal deficit.  Sensation intact distally Skin: No lesions in the area of chief complaint MSK/Surgical Site: left knee w/o redness or effusion.  Medial and lateral JLT. ROM 0-130.  5/5 strength in extension and flexion.  +EHL/FHL.  NVI.  Stable varus and valgus stress.    Imaging Review Plain radiographs demonstrate severe degenerative joint disease of the left knee.   The overall alignment ismild varus. The bone quality appears to be adequate for age and reported activity level.  Preoperative templating of the joint replacement has been completed, documented, and submitted to the Operating Room personnel in order to optimize intra-operative equipment management.  Assessment: OA LEFT KNEE   Plan: Plan for Procedure(s): TOTAL KNEE ARTHROPLASTY  The patient history, physical exam, clinical judgement of the provider and imaging are consistent with end stage degenerative joint disease and total joint arthroplasty is deemed medically necessary. The treatment options including medical management, injection therapy, and arthroplasty were discussed at length. The risks and benefits of Procedure(s): TOTAL KNEE ARTHROPLASTY were presented and reviewed.  The risks of nonoperative treatment, versus surgical intervention including but not limited to continued pain, aseptic loosening, stiffness, dislocation/subluxation, infection, bleeding, nerve injury, blood clots, cardiopulmonary complications, morbidity, mortality, among others were discussed. The patient  verbalizes understanding and wishes to proceed with the plan.  Patient is being admitted for inpatient treatment for surgery, pain control, PT, prophylactic antibiotics, VTE prophylaxis, progressive ambulation, ADL's and discharge planning.   Dental prophylaxis discussed and recommended for 2 years postoperatively.  The patient does meet the criteria for TXA which will be used perioperatively.  ASA 81 mg  will be used postoperatively for DVT prophylaxis in addition to SCDs, and early ambulation. The patient is planning to be discharged home with HHPT in care of family   Jola Baptist 07/26/2022 4:38 PM

## 2022-07-28 ENCOUNTER — Other Ambulatory Visit: Payer: Self-pay | Admitting: Family Medicine

## 2022-07-31 ENCOUNTER — Other Ambulatory Visit: Payer: Self-pay

## 2022-07-31 MED ORDER — TAMSULOSIN HCL 0.4 MG PO CAPS: 0.4000 mg | ORAL_CAPSULE | Freq: Every day | ORAL | 1 refills | Status: DC

## 2022-07-31 MED ORDER — VALACYCLOVIR HCL 500 MG PO TABS: 500.0000 mg | ORAL_TABLET | Freq: Every day | ORAL | 1 refills | Status: DC

## 2022-07-31 MED ORDER — PREGABALIN 100 MG PO CAPS: 100.0000 mg | ORAL_CAPSULE | Freq: Two times a day (BID) | ORAL | 1 refills | Status: DC

## 2022-07-31 MED ORDER — PRAVASTATIN SODIUM 40 MG PO TABS: 40.0000 mg | ORAL_TABLET | Freq: Every day | ORAL | 1 refills | Status: DC

## 2022-07-31 NOTE — Care Plan (Signed)
Ortho Bundle Case Management Note  Patient Details  Name: Angie [name] MRN: [id number] Date of Birth: [birth year]   met with patient in the office for H&P. she will discharge to home with family to assist. has RW at home OPPT set up with [facility]. discharge instructions discussed and questions answered. appointments confirmed. Patient and MD in agreement. Choice offered.                   DME Arranged:    DME Agency:     HH Arranged:  PT HH Agency:  [facility]  Additional Comments: Please contact me with any questions of if this plan should need to change.  Ladell Heads,  [place] [facility]ist  [phone] 07/31/2022, 12:31 PM

## 2022-08-04 ENCOUNTER — Encounter (HOSPITAL_COMMUNITY)
Admission: RE | Admit: 2022-08-04 | Discharge: 2022-08-04 | Disposition: A | Discharge status: Home / Self Care | Payer: Medicare Other | Source: Ambulatory Visit | Attending: Orthopedic Surgery | Admitting: Orthopedic Surgery

## 2022-08-04 ENCOUNTER — Other Ambulatory Visit: Payer: Self-pay

## 2022-08-04 ENCOUNTER — Encounter (HOSPITAL_COMMUNITY): Payer: Self-pay

## 2022-08-04 VITALS — BP 153/68 | HR 75 | Temp 97.8°F | Ht 63.0 in | Wt 207.0 lb

## 2022-08-04 DIAGNOSIS — I10 Essential (primary) hypertension: Secondary | ICD-10-CM | POA: Diagnosis not present

## 2022-08-04 DIAGNOSIS — Z01812 Encounter for preprocedural laboratory examination: Secondary | ICD-10-CM | POA: Diagnosis not present

## 2022-08-04 DIAGNOSIS — Z01818 Encounter for other preprocedural examination: Secondary | ICD-10-CM

## 2022-08-04 DIAGNOSIS — R7303 Prediabetes: Secondary | ICD-10-CM | POA: Insufficient documentation

## 2022-08-04 LAB — CBC
HCT: 41.8 % (ref 36.0–46.0)
Hemoglobin: 13.7 g/dL (ref 12.0–15.0)
MCH: 30 pg (ref 26.0–34.0)
MCHC: 32.8 g/dL (ref 30.0–36.0)
MCV: 91.7 fL (ref 80.0–100.0)
Platelets: 165 10*3/uL (ref 150–400)
RBC: 4.56 MIL/uL (ref 3.87–5.11)
RDW: 13.7 % (ref 11.5–15.5)
WBC: 7.3 10*3/uL (ref 4.0–10.5)
nRBC: 0 % (ref 0.0–0.2)

## 2022-08-04 LAB — BASIC METABOLIC PANEL
Anion gap: 8 (ref 5–15)
BUN: 16 mg/dL (ref 8–23)
CO2: 30 mmol/L (ref 22–32)
Calcium: 9.7 mg/dL (ref 8.9–10.3)
Chloride: 104 mmol/L (ref 98–111)
Creatinine, Ser: 0.66 mg/dL (ref 0.44–1.00)
GFR, Estimated: >60 mL/min (ref >60)
Glucose, Bld: 131 mg/dL — ABNORMAL HIGH (ref 70–99)
Potassium: 3.4 mmol/L — ABNORMAL LOW (ref 3.5–5.1)
Sodium: 142 mmol/L (ref 135–145)

## 2022-08-04 LAB — HEMOGLOBIN A1C
Hgb A1c MFr Bld: 6 % — ABNORMAL HIGH (ref 4.8–5.6)
Mean Plasma Glucose: 125.5 mg/dL

## 2022-08-04 LAB — GLUCOSE, CAPILLARY: Glucose-Capillary: 145 mg/dL — ABNORMAL HIGH (ref 70–99)

## 2022-08-04 LAB — SURGICAL PCR SCREEN
MRSA, PCR: NEGATIVE
Staphylococcus aureus: NEGATIVE

## 2022-08-04 NOTE — Progress Notes (Addendum)
For Short Stay: [place] appointment date:  Bowel Prep reminder:   For Anesthesia: PCP - Dr. [name] Cardiologist - N/A  Chest x-ray -  EKG - 05/29/22 Stress Test -  ECHO - 08/01/21 Cardiac Cath -  Pacemaker/ICD device last checked: Pacemaker orders received: Device Rep notified:  Spinal Cord Stimulator:  Sleep Study -  CPAP -   Fasting Blood Sugar - N/A Checks Blood Sugar __0___ times a day Date and result of last Hgb A1c-6.2: 04/18/22  Last dose of GLP1 agonist-  GLP1 instructions:   Last dose of SGLT-2 inhibitors-  SGLT-2 instructions:   Blood Thinner Instructions: Aspirin Instructions: Last Dose:  Activity level: Can go up a flight of stairs and activities of daily living without stopping and without chest pain and/or shortness of breath   Able to exercise without chest pain and/or shortness of breath     Anesthesia review: Hx: HTN,DIA  Patient denies shortness of breath, fever, cough and chest pain at PAT appointment   Patient verbalized understanding of instructions that were given to them at the PAT appointment. Patient was also instructed that they will need to review over the PAT instructions again at home before surgery.

## 2022-08-04 NOTE — Patient Instructions (Signed)
DUE TO COVID-19 ONLY TWO VISITORS  (aged 76 and older)  ARE ALLOWED TO COME WITH YOU AND STAY IN THE WAITING ROOM ONLY DURING PRE OP AND PROCEDURE.   **NO VISITORS ARE ALLOWED IN THE SHORT STAY AREA OR RECOVERY ROOM!!**  IF YOU WILL BE ADMITTED INTO THE HOSPITAL YOU ARE ALLOWED ONLY FOUR SUPPORT PEOPLE DURING VISITATION HOURS ONLY (7 AM -8PM)   The support person(s) must pass our screening, gel in and out, and wear a mask at all times, including in the patient's room. Patients must also wear a mask when staff or their support person are in the room. Visitors GUEST BADGE MUST BE WORN VISIBLY  One adult visitor may remain with you overnight and MUST be in the room by 8 P.M.     Your procedure is scheduled on: 08/16/22   Report to [facility] Main Entrance    Report to admitting at : 7:45 AM   Call this number if you have problems the morning of surgery [phone]   Do not eat food :After Midnight.   After Midnight you may have the following liquids until: 7:00 AM DAY OF SURGERY  Water Black Coffee (sugar ok, NO MILK/CREAM OR CREAMERS)  Tea (sugar ok, NO MILK/CREAM OR CREAMERS) regular and decaf                             Plain Jell-O (NO RED)                                           Fruit ices (not with fruit pulp, NO RED)                                     Popsicles (NO RED)                                                                  Juice: apple, WHITE grape, WHITE cranberry Sports drinks like Gatorade (NO RED)                The day of surgery:  Drink ONE (1) Pre-Surgery Clear Ensure or G2 at: 7:00 AM the morning of surgery. Drink in one sitting. Do not sip.  This drink was given to you during your hospital  pre-op appointment visit. Nothing else to drink after completing the  Pre-Surgery Clear Ensure or G2.          If you have questions, please contact your surgeon's office.  Oral Hygiene is also important to reduce your risk of infection.                                     Remember - BRUSH YOUR TEETH THE MORNING OF SURGERY WITH YOUR REGULAR TOOTHPASTE  DENTURES WILL BE REMOVED PRIOR TO SURGERY PLEASE DO NOT APPLY "Poly grip" OR ADHESIVES!!!   Do NOT smoke after Midnight   Take these medicines the morning of surgery with  A SIP OF WATER: pregabalin,valacyclovir,tamsulosin,famotidine.  Bring CPAP mask and tubing day of surgery.                              You may not have any metal on your body including hair pins, jewelry, and body piercing             Do not wear make-up, lotions, powders, perfumes/cologne, or deodorant  Do not wear nail polish including gel and S&S, artificial/acrylic nails, or any other type of covering on natural nails including finger and toenails. If you have artificial nails, gel coating, etc. that needs to be removed by a nail salon please have this removed prior to surgery or surgery may need to be canceled/ delayed if the surgeon/ anesthesia feels like they are unable to be safely monitored.   Do not shave  48 hours prior to surgery.               Men may shave face and neck.   Do not bring valuables to the hospital. [place].   Contacts, glasses, or bridgework may not be worn into surgery.   Bring small overnight bag day of surgery.   DO NOT [place]. PHARMACY WILL DISPENSE MEDICATIONS LISTED ON YOUR MEDICATION LIST TO YOU DURING YOUR ADMISSION [place]!    Patients discharged on the day of surgery will not be allowed to drive home.  Someone NEEDS to stay with you for the first 24 hours after anesthesia.   Special Instructions: Bring a copy of your healthcare power of attorney and living will documents         the day of surgery if you haven't scanned them before.              Please read over the following fact sheets you were given: IF YOU HAVE QUESTIONS ABOUT YOUR PRE-OP INSTRUCTIONS PLEASE CALL [phone]     [facility] - Preparing for Surgery Before surgery, you can play an important role.  Because skin is not sterile, your skin needs to be as free of germs as possible.  You can reduce the number of germs on your skin by washing with CHG (chlorahexidine gluconate) soap before surgery.  CHG is an antiseptic cleaner which kills germs and bonds with the skin to continue killing germs even after washing. Please DO NOT use if you have an allergy to CHG or antibacterial soaps.  If your skin becomes reddened/irritated stop using the CHG and inform your nurse when you arrive at Short Stay. Do not shave (including legs and underarms) for at least 48 hours prior to the first CHG shower.  You may shave your face/neck. Please follow these instructions carefully:  1.  Shower with CHG Soap the night before surgery and the  morning of Surgery.  2.  If you choose to wash your hair, wash your hair first as usual with your  normal  shampoo.  3.  After you shampoo, rinse your hair and body thoroughly to remove the  shampoo.                           4.  Use CHG as you would any other liquid soap.  You can apply chg directly  to  the skin and wash                       Gently with a scrungie or clean washcloth.  5.  Apply the CHG Soap to your body ONLY FROM THE NECK DOWN.   Do not use on face/ open                           Wound or open sores. Avoid contact with eyes, ears mouth and genitals (private parts).                       Wash face,  Genitals (private parts) with your normal soap.             6.  Wash thoroughly, paying special attention to the area where your surgery  will be performed.  7.  Thoroughly rinse your body with warm water from the neck down.  8.  DO NOT shower/wash with your normal soap after using and rinsing off  the CHG Soap.                9.  Pat yourself dry with a clean towel.            10.  Wear clean pajamas.            11.  Place clean sheets on your bed the night of your first shower and  do not  sleep with pets. Day of Surgery : Do not apply any lotions/deodorants the morning of surgery.  Please wear clean clothes to the hospital/surgery center.  FAILURE TO FOLLOW THESE INSTRUCTIONS MAY RESULT IN THE CANCELLATION OF YOUR SURGERY PATIENT SIGNATURE_________________________________  NURSE SIGNATURE__________________________________  ________________________________________________________________________  Adam Phenix  An incentive spirometer is a tool that can help keep your lungs clear and active. This tool measures how well you are filling your lungs with each breath. Taking long deep breaths may help reverse or decrease the chance of developing breathing (pulmonary) problems (especially infection) following: A long period of time when you are unable to move or be active. BEFORE THE PROCEDURE  If the spirometer includes an indicator to show your best effort, your nurse or respiratory therapist will set it to a desired goal. If possible, sit up straight or lean slightly forward. Try not to slouch. Hold the incentive spirometer in an upright position. INSTRUCTIONS FOR USE  Sit on the edge of your bed if possible, or sit up as far as you can in bed or on a chair. Hold the incentive spirometer in an upright position. Breathe out normally. Place the mouthpiece in your mouth and seal your lips tightly around it. Breathe in slowly and as deeply as possible, raising the piston or the ball toward the top of the column. Hold your breath for 3-5 seconds or for as long as possible. Allow the piston or ball to fall to the bottom of the column. Remove the mouthpiece from your mouth and breathe out normally. Rest for a few seconds and repeat Steps 1 through 7 at least 10 times every 1-2 hours when you are awake. Take your time and take a few normal breaths between deep breaths. The spirometer may include an indicator to show your best effort. Use the indicator as a goal to work  toward during each repetition. After each set of 10 deep breaths, practice coughing to be sure your lungs are clear. If  you have an incision (the cut made at the time of surgery), support your incision when coughing by placing a pillow or rolled up towels firmly against it. Once you are able to get out of bed, walk around indoors and cough well. You may stop using the incentive spirometer when instructed by your caregiver.  RISKS AND COMPLICATIONS Take your time so you do not get dizzy or light-headed. If you are in pain, you may need to take or ask for pain medication before doing incentive spirometry. It is harder to take a deep breath if you are having pain. AFTER USE Rest and breathe slowly and easily. It can be helpful to keep track of a log of your progress. Your caregiver can provide you with a simple table to help with this. If you are using the spirometer at home, follow these instructions: [place] IF:  You are having difficultly using the spirometer. You have trouble using the spirometer as often as instructed. Your pain medication is not giving enough relief while using the spirometer. You develop fever of 100.5 F (38.1 C) or higher. SEEK IMMEDIATE MEDICAL CARE IF:  You cough up bloody sputum that had not been present before. You develop fever of 102 F (38.9 C) or greater. You develop worsening pain at or near the incision site. MAKE SURE YOU:  Understand these instructions. Will watch your condition. Will get help right away if you are not doing well or get worse. Document Released: 11/20/2006 Document Revised: 10/02/2011 Document Reviewed: 01/21/2007 [facility] Patient Information 2014 [place], [place].   ________________________________________________________________________

## 2022-08-07 ENCOUNTER — Ambulatory Visit (INDEPENDENT_AMBULATORY_CARE_PROVIDER_SITE_OTHER): Payer: Medicare Other | Admitting: Family Medicine

## 2022-08-07 ENCOUNTER — Ambulatory Visit (HOSPITAL_COMMUNITY)
Admission: RE | Admit: 2022-08-07 | Discharge: 2022-08-07 | Disposition: A | Discharge status: Home / Self Care | Payer: Medicare Other | Source: Ambulatory Visit | Attending: Family Medicine | Admitting: Family Medicine

## 2022-08-07 ENCOUNTER — Encounter: Payer: Self-pay | Admitting: Family Medicine

## 2022-08-07 ENCOUNTER — Telehealth: Payer: Self-pay | Admitting: Family Medicine

## 2022-08-07 VITALS — BP 124/68 | HR 87 | Ht 63.0 in | Wt 209.0 lb

## 2022-08-07 DIAGNOSIS — R059 Cough, unspecified: Secondary | ICD-10-CM | POA: Insufficient documentation

## 2022-08-07 DIAGNOSIS — R062 Wheezing: Secondary | ICD-10-CM | POA: Diagnosis not present

## 2022-08-07 LAB — POC SOFIA 2 FLU + SARS ANTIGEN FIA
Influenza A, POC: NEGATIVE
Influenza B, POC: NEGATIVE
SARS Coronavirus 2 Ag: NEGATIVE

## 2022-08-07 MED ORDER — BENZONATATE 100 MG PO CAPS: 100.0000 mg | ORAL_CAPSULE | Freq: Two times a day (BID) | ORAL | 0 refills | Status: DC | PRN

## 2022-08-07 NOTE — Telephone Encounter (Signed)
Negative Cxr. Called patient to discuss. No antibiotics indicated at this time.  Ezequiel Essex, MD

## 2022-08-07 NOTE — Assessment & Plan Note (Signed)
Suspect viral URI versus pneumonia.  Rapid flu and COVID-negative in clinic.  Sending patient for 2 view CXR, as physical exam inconclusive.  Like a fevers discounts pneumonia.  Return precautions and conservative management discussed.  See AVS for more.

## 2022-08-07 NOTE — Patient Instructions (Addendum)
It was wonderful to see you today. Thank you for allowing me to be a part of your care. Below is a short summary of what we discussed at your visit today:  Cough, dysgeusia  Today we collected a rapid flu and COVID test, which was negative for both.  I sent in tessalon pearls to your pharmacy to help with cough.  Chest x-ray ordered, details below: [facility] is closed for [place] [place] Day.  Please walk across the street and go to the [facility] radiology department for chest x-ray. Your appointment is technically for 1 pm, but they can probably get you worked in whenever you walk over per the scheduler.  If the results are normal, I will send you a letter or MyChart message. If the results are abnormal, I will give you a call.     Return for care: - high fevers and chills - worsening shortness of breath   If you have any questions or concerns, please do not hesitate to contact us via phone or MyChart message.   Ezequiel Essex, MD

## 2022-08-07 NOTE — Progress Notes (Signed)
    SUBJECTIVE:   CHIEF COMPLAINT / HPI:   Cough Day 4 Symptoms: cough (worse at night), lack of taste x2 days, fatigue "from coughing", SOB, nasal congestion Denies: fever, sinus pain, ear discomfort, n/v/d, rashes, red eyes Has tried robitussin - mild relief Husband also sick, symptoms started before hers UTD on flu vaccine UTD on COVID vaccine, last bivalent booster 07/27/2021  PERTINENT  PMH / PSH:  Patient Active Problem List   Diagnosis Date Noted   Chronic pain of left knee 05/19/2022   Allergic rhinosinusitis 04/18/2022   Incomplete emptying of bladder 04/18/2022   Osteoarthritis of right hip, unspecified osteoarthritis type 08/22/2021   Degenerative joint disease (DJD) of hip 07/27/2021   Skin lesion 11/09/2020   H/O colonoscopy 12/29/2019   Spondylolisthesis of lumbar region 08/08/2018   Nasal polyp, benign 03/30/2017   Pre-diabetes 10/13/2016   GERD (gastroesophageal reflux disease) 06/22/2015   Hot flash, menopausal 06/22/2015   Cough 08/03/2011   Herpes simplex 04/11/2011   Hyperlipidemia 82/95/6213   Lichen sclerosus of female genitalia 05/21/2009   VARICOSE VEINS, LOWER EXTREMITIES, MILD 08/65/7846   SYSTOLIC MURMUR 96/29/5284   Morbid obesity ([place]) 09/20/2006   HYPERTENSION, BENIGN SYSTEMIC 09/20/2006   DIVERTICULOSIS OF COLON 09/20/2006    OBJECTIVE:   BP 124/68   Pulse 87   Ht '5\' 3"'$  (1.6 m)   Wt 209 lb (94.8 kg)   SpO2 98%   BMI 37.02 kg/m    PHQ-9:     08/07/2022   11:38 AM 06/08/2022    4:10 PM 05/19/2022   10:12 AM  Depression screen PHQ 2/9  Decreased Interest 1 0 0  Down, Depressed, Hopeless 0 0 0  PHQ - 2 Score 1 0 0  Altered sleeping  1 0  Tired, decreased energy  1 1  Change in appetite  3 1  Feeling bad or failure about yourself   1 0  Trouble concentrating  1 0  Moving slowly or fidgety/restless   0  Suicidal thoughts  0 0  PHQ-9 Score  7 2  Difficult doing work/chores  Not difficult at all Not difficult at all   Physical  Exam General: Awake, alert, oriented HEENT: PERRL, bilateral TM pearly pink and flat, bilateral external auditory canals with minimal cerumen burden, no lesions, nasal mucosa slightly edematous, oral mucosa pink, moist, without lesion, intact dentition without obvious cavity Lymph: No palpable lymphedema of head or neck Cardiovascular: Regular rate and rhythm, S1 and S2 present, baseline systolic murmur auscultated Respiratory: Scant rhonchi in anterior LUL, otherwise lung fields clear to auscultation bilaterally  ASSESSMENT/PLAN:   Cough Suspect viral URI versus pneumonia.  Rapid flu and COVID-negative in clinic.  Sending patient for 2 view CXR, as physical exam inconclusive.  Like a fevers discounts pneumonia.  Return precautions and conservative management discussed.  See AVS for more.     Ezequiel Essex, MD [place]

## 2022-08-09 ENCOUNTER — Telehealth: Payer: Self-pay | Admitting: *Deleted

## 2022-08-09 NOTE — Progress Notes (Unsigned)
  Care Coordination Note  08/09/2022 Name: AmeLie Reid-White MRN: [id number] DOB: 1945/12/11  Fonnie Reid-White is a 77 y.o. year old female who is a primary care patient of [name], MD and is actively engaged with the care management team. I reached out to [place] by phone today to assist with re-scheduling a follow up visit with the RN Case Manager  Follow up plan: Unsuccessful telephone outreach attempt made.  [place]  Direct Dial: [phone]

## 2022-08-11 ENCOUNTER — Ambulatory Visit (INDEPENDENT_AMBULATORY_CARE_PROVIDER_SITE_OTHER): Payer: Medicare Other | Admitting: Family Medicine

## 2022-08-11 ENCOUNTER — Other Ambulatory Visit: Payer: Self-pay

## 2022-08-11 VITALS — BP 137/63 | HR 89 | Wt 209.8 lb

## 2022-08-11 DIAGNOSIS — R059 Cough, unspecified: Secondary | ICD-10-CM

## 2022-08-11 NOTE — Progress Notes (Signed)
    SUBJECTIVE:   CHIEF COMPLAINT / HPI:   Cough -Now on day 8 of symptoms -Husband was sick with similar symptoms prior to her -Patient seen on 1/15, thought to be viral URI, COVID/flu negative, CXR was obtained and was normal. Tessalon pearls sent.  -Returns today for follow-up because her cough is still present -Finds tessalon helpful, also taking Mucinex which is helpful -Drinking tea with honey at night -Symptoms are worse at night -Cough is nonproductive -Some nasal drainage in the morning but none throughout the day -No fever, chest pain, dyspnea, or GI symptoms -Nothing has changed about her symptoms to prompt return visit. She is just concerned because the cough is still present and she has total knee replacement scheduled for 1/24 (next week)   PERTINENT  PMH / PSH: HTN, HLD, pre-diabetes, osteoarthritis  OBJECTIVE:   BP 137/63   Pulse 89   Wt 209 lb 12.8 oz (95.2 kg)   SpO2 99%   BMI 37.16 kg/m   Gen: NAD, pleasant, able to participate in exam HEENT: [place]/AT, PERRLA, nares patent bilaterally, TM normal bilaterally, oropharynx unremarkable Neck: supple, no cervical or supraclavicular lymphadenopathy CV: RRR, normal K0/U5, II/VI systolic murmur Resp: Normal effort, lungs CTAB GI: Bowel sounds present, abdomen soft, non-tender, non-distended Extremities: no edema or cyanosis Skin: warm and dry, no rashes noted Neuro: alert, no obvious focal deficits Psych: Normal affect and mood   ASSESSMENT/PLAN:   Cough Post-viral. Had negative COVID/flu testing and normal CXR. No suspicion for new pneumonia or other infection as her symptoms have not changed since prior visit. This is well within the normal time course for viral illness and there are no red flags on history or exam. Discussed it may take several weeks to resolve completely. Continue supportive measures. Advised discussion with her surgeon re: risks/benefits of re-scheduling her upcoming elective knee replacement      Alcus Dad, MD [place]

## 2022-08-11 NOTE — Assessment & Plan Note (Signed)
Post-viral. Had negative COVID/flu testing and normal CXR. No suspicion for new pneumonia or other infection as her symptoms have not changed since prior visit. This is well within the normal time course for viral illness and there are no red flags on history or exam. Discussed it may take several weeks to resolve completely. Continue supportive measures. Advised discussion with her surgeon re: risks/benefits of re-scheduling her upcoming elective knee replacement

## 2022-08-11 NOTE — Patient Instructions (Addendum)
It was great to see you!  I'm sorry you're still coughing. It is typical for post-viral cough to last several weeks. Continue the tessalon as needed, as well as Mucinex and honey.  Please reach out to your orthopedist and inform them about your cough. Let them know the following: -you feel otherwise well but have a nagging cough, No fever etc. -you were tested for covid/flu and it was negative -you had a normal chest x-ray -your doctor thinks it's a normal post-viral cough but advised discussion with your surgeon to discuss risks/benefits of proceeding vs rescheduling your surgery.   Take care, Dr Rock Nephew

## 2022-08-16 ENCOUNTER — Observation Stay (HOSPITAL_COMMUNITY): Payer: Medicare Other

## 2022-08-16 ENCOUNTER — Observation Stay (HOSPITAL_COMMUNITY)
Admission: RE | Admit: 2022-08-16 | Discharge: 2022-08-17 | Disposition: A | Discharge status: Home Health | Payer: Medicare Other | Attending: Orthopedic Surgery | Admitting: Orthopedic Surgery

## 2022-08-16 ENCOUNTER — Ambulatory Visit (HOSPITAL_COMMUNITY): Payer: Medicare Other | Admitting: Physician Assistant

## 2022-08-16 ENCOUNTER — Other Ambulatory Visit: Payer: Self-pay

## 2022-08-16 ENCOUNTER — Ambulatory Visit (HOSPITAL_BASED_OUTPATIENT_CLINIC_OR_DEPARTMENT_OTHER): Payer: Medicare Other | Admitting: Anesthesiology

## 2022-08-16 ENCOUNTER — Encounter (HOSPITAL_COMMUNITY): Payer: Self-pay | Admitting: Orthopedic Surgery

## 2022-08-16 ENCOUNTER — Encounter (HOSPITAL_COMMUNITY)
Admission: RE | Disposition: A | Discharge status: Home Health | Payer: Self-pay | Source: Home / Self Care | Attending: Orthopedic Surgery

## 2022-08-16 DIAGNOSIS — Z79899 Other long term (current) drug therapy: Secondary | ICD-10-CM | POA: Diagnosis not present

## 2022-08-16 DIAGNOSIS — I10 Essential (primary) hypertension: Secondary | ICD-10-CM | POA: Diagnosis not present

## 2022-08-16 DIAGNOSIS — Z96652 Presence of left artificial knee joint: Secondary | ICD-10-CM | POA: Diagnosis not present

## 2022-08-16 DIAGNOSIS — M1712 Unilateral primary osteoarthritis, left knee: Secondary | ICD-10-CM

## 2022-08-16 DIAGNOSIS — Z96641 Presence of right artificial hip joint: Secondary | ICD-10-CM | POA: Insufficient documentation

## 2022-08-16 DIAGNOSIS — G8918 Other acute postprocedural pain: Secondary | ICD-10-CM | POA: Diagnosis not present

## 2022-08-16 DIAGNOSIS — Z87891 Personal history of nicotine dependence: Secondary | ICD-10-CM | POA: Diagnosis not present

## 2022-08-16 DIAGNOSIS — Z471 Aftercare following joint replacement surgery: Secondary | ICD-10-CM | POA: Diagnosis not present

## 2022-08-16 HISTORY — PX: TOTAL KNEE ARTHROPLASTY: SHX125

## 2022-08-16 LAB — GLUCOSE, CAPILLARY: Glucose-Capillary: 114 mg/dL — ABNORMAL HIGH (ref 70–99)

## 2022-08-16 SURGERY — ARTHROPLASTY, KNEE, TOTAL
Anesthesia: General | Site: Knee | Laterality: Left

## 2022-08-16 MED ORDER — BUPIVACAINE LIPOSOME 1.3 % IJ SUSP
INTRAMUSCULAR | Status: AC
  Filled 2022-08-16: qty 20

## 2022-08-16 MED ORDER — TRANEXAMIC ACID-NACL 1000-0.7 MG/100ML-% IV SOLN
1000.0000 mg | INTRAVENOUS | Status: AC
  Administered 2022-08-16: 1000 mg via INTRAVENOUS
  Filled 2022-08-16: qty 100

## 2022-08-16 MED ORDER — SODIUM CHLORIDE (PF) 0.9 % IJ SOLN
INTRAMUSCULAR | Status: AC
  Filled 2022-08-16: qty 10

## 2022-08-16 MED ORDER — SODIUM CHLORIDE 0.9% FLUSH
INTRAVENOUS | Status: DC | PRN
  Administered 2022-08-16: 80 mL

## 2022-08-16 MED ORDER — ONDANSETRON HCL 4 MG/2ML IJ SOLN
4.0000 mg | Freq: Four times a day (QID) | INTRAMUSCULAR | Status: DC | PRN
  Administered 2022-08-16: 4 mg via INTRAVENOUS
  Filled 2022-08-16: qty 2

## 2022-08-16 MED ORDER — POVIDONE-IODINE 10 % EX SWAB: 2.0000 | Freq: Once | CUTANEOUS | Status: AC

## 2022-08-16 MED ORDER — DEXAMETHASONE SODIUM PHOSPHATE 10 MG/ML IJ SOLN: 8.0000 mg | Freq: Once | INTRAMUSCULAR | Status: DC

## 2022-08-16 MED ORDER — PHENOL 1.4 % MT LIQD: 1.0000 | OROMUCOSAL | Status: DC | PRN

## 2022-08-16 MED ORDER — PREGABALIN 100 MG PO CAPS
100.0000 mg | ORAL_CAPSULE | Freq: Two times a day (BID) | ORAL | Status: DC
  Administered 2022-08-16 – 2022-08-17 (×2): 100 mg via ORAL
  Filled 2022-08-16 (×2): qty 1

## 2022-08-16 MED ORDER — MIDAZOLAM HCL 2 MG/2ML IJ SOLN
1.0000 mg | Freq: Once | INTRAMUSCULAR | Status: AC
  Administered 2022-08-16: 1 mg via INTRAVENOUS
  Filled 2022-08-16: qty 2

## 2022-08-16 MED ORDER — SODIUM CHLORIDE (PF) 0.9 % IJ SOLN
INTRAMUSCULAR | Status: AC
  Filled 2022-08-16: qty 50

## 2022-08-16 MED ORDER — HYDROMORPHONE HCL 1 MG/ML IJ SOLN: 0.5000 mg | INTRAMUSCULAR | Status: DC | PRN

## 2022-08-16 MED ORDER — METHOCARBAMOL 500 MG PO TABS
500.0000 mg | ORAL_TABLET | Freq: Four times a day (QID) | ORAL | Status: DC | PRN
  Administered 2022-08-17: 500 mg via ORAL
  Filled 2022-08-16 (×2): qty 1

## 2022-08-16 MED ORDER — FENTANYL CITRATE (PF) 100 MCG/2ML IJ SOLN
INTRAMUSCULAR | Status: AC
  Filled 2022-08-16: qty 2

## 2022-08-16 MED ORDER — HYDROMORPHONE HCL 1 MG/ML IJ SOLN
INTRAMUSCULAR | Status: AC
  Filled 2022-08-16: qty 1

## 2022-08-16 MED ORDER — METHOCARBAMOL 500 MG IVPB - SIMPLE MED
INTRAVENOUS | Status: AC
  Filled 2022-08-16: qty 55

## 2022-08-16 MED ORDER — HYDROCHLOROTHIAZIDE 25 MG PO TABS
25.0000 mg | ORAL_TABLET | Freq: Every day | ORAL | Status: DC
  Administered 2022-08-17: 25 mg via ORAL
  Filled 2022-08-16: qty 1

## 2022-08-16 MED ORDER — DOCUSATE SODIUM 100 MG PO CAPS
100.0000 mg | ORAL_CAPSULE | Freq: Two times a day (BID) | ORAL | Status: DC
  Administered 2022-08-16 – 2022-08-17 (×2): 100 mg via ORAL
  Filled 2022-08-16 (×2): qty 1

## 2022-08-16 MED ORDER — BUPIVACAINE LIPOSOME 1.3 % IJ SUSP: 20.0000 mL | Freq: Once | INTRAMUSCULAR | Status: DC

## 2022-08-16 MED ORDER — SENNOSIDES-DOCUSATE SODIUM 8.6-50 MG PO TABS: 1.0000 | ORAL_TABLET | Freq: Every day | ORAL | Status: DC | PRN

## 2022-08-16 MED ORDER — ISOPROPYL ALCOHOL 70 % SOLN
Status: DC | PRN
  Administered 2022-08-16: 1 via TOPICAL

## 2022-08-16 MED ORDER — ORAL CARE MOUTH RINSE: 15.0000 mL | Freq: Once | OROMUCOSAL | Status: AC

## 2022-08-16 MED ORDER — LACTATED RINGERS IV SOLN: INTRAVENOUS | Status: DC

## 2022-08-16 MED ORDER — ONDANSETRON HCL 4 MG/2ML IJ SOLN: 4.0000 mg | Freq: Once | INTRAMUSCULAR | Status: DC | PRN

## 2022-08-16 MED ORDER — CELECOXIB 200 MG PO CAPS
400.0000 mg | ORAL_CAPSULE | Freq: Once | ORAL | Status: AC
  Administered 2022-08-16: 400 mg via ORAL
  Filled 2022-08-16: qty 2

## 2022-08-16 MED ORDER — CEFAZOLIN SODIUM-DEXTROSE 2-4 GM/100ML-% IV SOLN
2.0000 g | INTRAVENOUS | Status: AC
  Administered 2022-08-16: 2 g via INTRAVENOUS
  Filled 2022-08-16: qty 100

## 2022-08-16 MED ORDER — ROPIVACAINE HCL 7.5 MG/ML IJ SOLN
INTRAMUSCULAR | Status: DC | PRN
  Administered 2022-08-16: 20 mL via PERINEURAL

## 2022-08-16 MED ORDER — ONDANSETRON HCL 4 MG/2ML IJ SOLN
INTRAMUSCULAR | Status: DC | PRN
  Administered 2022-08-16: 4 mg via INTRAVENOUS

## 2022-08-16 MED ORDER — OXYCODONE HCL 5 MG PO TABS: 5.0000 mg | ORAL_TABLET | Freq: Once | ORAL | Status: DC | PRN

## 2022-08-16 MED ORDER — DEXAMETHASONE SODIUM PHOSPHATE 10 MG/ML IJ SOLN
INTRAMUSCULAR | Status: DC | PRN
  Administered 2022-08-16: 8 mg via INTRAVENOUS

## 2022-08-16 MED ORDER — DIPHENHYDRAMINE HCL 12.5 MG/5ML PO ELIX: 12.5000 mg | ORAL_SOLUTION | ORAL | Status: DC | PRN

## 2022-08-16 MED ORDER — SODIUM CHLORIDE 0.9 % IR SOLN
Status: DC | PRN
  Administered 2022-08-16: 3000 mL

## 2022-08-16 MED ORDER — OXYCODONE HCL 5 MG PO TABS
5.0000 mg | ORAL_TABLET | ORAL | Status: DC | PRN
  Administered 2022-08-16: 5 mg via ORAL
  Filled 2022-08-16 (×2): qty 1

## 2022-08-16 MED ORDER — METHOCARBAMOL 500 MG IVPB - SIMPLE MED
500.0000 mg | Freq: Four times a day (QID) | INTRAVENOUS | Status: DC | PRN
  Administered 2022-08-16: 500 mg via INTRAVENOUS
  Filled 2022-08-16: qty 55

## 2022-08-16 MED ORDER — KETOROLAC TROMETHAMINE 15 MG/ML IJ SOLN
INTRAMUSCULAR | Status: AC
  Filled 2022-08-16: qty 1

## 2022-08-16 MED ORDER — ASPIRIN 81 MG PO CHEW
81.0000 mg | CHEWABLE_TABLET | Freq: Two times a day (BID) | ORAL | Status: DC
  Administered 2022-08-16 – 2022-08-17 (×2): 81 mg via ORAL
  Filled 2022-08-16 (×2): qty 1

## 2022-08-16 MED ORDER — CHLORHEXIDINE GLUCONATE 0.12 % MT SOLN
15.0000 mL | Freq: Once | OROMUCOSAL | Status: AC
  Administered 2022-08-16: 15 mL via OROMUCOSAL

## 2022-08-16 MED ORDER — PRAVASTATIN SODIUM 20 MG PO TABS: 40.0000 mg | ORAL_TABLET | Freq: Every day | ORAL | Status: DC

## 2022-08-16 MED ORDER — POVIDONE-IODINE 7.5 % EX SOLN: Freq: Once | CUTANEOUS | Status: DC

## 2022-08-16 MED ORDER — SODIUM CHLORIDE (PF) 0.9 % IJ SOLN
INTRAMUSCULAR | Status: AC
  Filled 2022-08-16: qty 20

## 2022-08-16 MED ORDER — CEFAZOLIN SODIUM-DEXTROSE 2-4 GM/100ML-% IV SOLN
2.0000 g | Freq: Four times a day (QID) | INTRAVENOUS | Status: AC
  Administered 2022-08-16 (×2): 2 g via INTRAVENOUS
  Filled 2022-08-16 (×2): qty 100

## 2022-08-16 MED ORDER — OXYCODONE HCL 5 MG/5ML PO SOLN: 5.0000 mg | Freq: Once | ORAL | Status: DC | PRN

## 2022-08-16 MED ORDER — ONDANSETRON HCL 4 MG PO TABS
4.0000 mg | ORAL_TABLET | Freq: Four times a day (QID) | ORAL | Status: DC | PRN
  Administered 2022-08-16: 4 mg via ORAL
  Filled 2022-08-16: qty 1

## 2022-08-16 MED ORDER — FENTANYL CITRATE (PF) 100 MCG/2ML IJ SOLN
INTRAMUSCULAR | Status: DC | PRN
  Administered 2022-08-16 (×5): 50 ug via INTRAVENOUS

## 2022-08-16 MED ORDER — PANTOPRAZOLE SODIUM 40 MG PO TBEC
40.0000 mg | DELAYED_RELEASE_TABLET | Freq: Every day | ORAL | Status: DC
  Administered 2022-08-16 – 2022-08-17 (×2): 40 mg via ORAL
  Filled 2022-08-16 (×2): qty 1

## 2022-08-16 MED ORDER — HYDROMORPHONE HCL 1 MG/ML IJ SOLN
0.2500 mg | INTRAMUSCULAR | Status: DC | PRN
  Administered 2022-08-16: 0.5 mg via INTRAVENOUS

## 2022-08-16 MED ORDER — ACETAMINOPHEN 500 MG PO TABS
1000.0000 mg | ORAL_TABLET | Freq: Once | ORAL | Status: AC
  Administered 2022-08-16: 1000 mg via ORAL
  Filled 2022-08-16: qty 2

## 2022-08-16 MED ORDER — TAMSULOSIN HCL 0.4 MG PO CAPS
0.4000 mg | ORAL_CAPSULE | Freq: Every day | ORAL | Status: DC
  Administered 2022-08-16 – 2022-08-17 (×2): 0.4 mg via ORAL
  Filled 2022-08-16 (×2): qty 1

## 2022-08-16 MED ORDER — POLYETHYLENE GLYCOL 3350 17 G PO PACK: 17.0000 g | PACK | Freq: Every day | ORAL | Status: DC | PRN

## 2022-08-16 MED ORDER — ACETAMINOPHEN 500 MG PO TABS
1000.0000 mg | ORAL_TABLET | Freq: Four times a day (QID) | ORAL | Status: DC
  Administered 2022-08-16 – 2022-08-17 (×3): 1000 mg via ORAL
  Filled 2022-08-16 (×3): qty 2

## 2022-08-16 MED ORDER — MENTHOL 3 MG MT LOZG
1.0000 | LOZENGE | OROMUCOSAL | Status: DC | PRN
  Administered 2022-08-16: 3 mg via ORAL
  Filled 2022-08-16: qty 9

## 2022-08-16 MED ORDER — ACETAMINOPHEN 325 MG PO TABS: 325.0000 mg | ORAL_TABLET | Freq: Four times a day (QID) | ORAL | Status: DC | PRN

## 2022-08-16 MED ORDER — FENTANYL CITRATE PF 50 MCG/ML IJ SOSY
50.0000 ug | PREFILLED_SYRINGE | Freq: Once | INTRAMUSCULAR | Status: AC
  Administered 2022-08-16: 50 ug via INTRAVENOUS
  Filled 2022-08-16: qty 2

## 2022-08-16 MED ORDER — BUPIVACAINE LIPOSOME 1.3 % IJ SUSP
INTRAMUSCULAR | Status: DC | PRN
  Administered 2022-08-16: 20 mL

## 2022-08-16 MED ORDER — MIDAZOLAM HCL 2 MG/2ML IJ SOLN
INTRAMUSCULAR | Status: AC
  Filled 2022-08-16: qty 2

## 2022-08-16 MED ORDER — ACETAMINOPHEN 10 MG/ML IV SOLN: 1000.0000 mg | Freq: Once | INTRAVENOUS | Status: DC | PRN

## 2022-08-16 MED ORDER — KETOROLAC TROMETHAMINE 15 MG/ML IJ SOLN
7.5000 mg | Freq: Four times a day (QID) | INTRAMUSCULAR | Status: DC
  Administered 2022-08-16 – 2022-08-17 (×3): 7.5 mg via INTRAVENOUS
  Filled 2022-08-16 (×3): qty 1

## 2022-08-16 MED ORDER — ZOLPIDEM TARTRATE 5 MG PO TABS: 5.0000 mg | ORAL_TABLET | Freq: Every evening | ORAL | Status: DC | PRN

## 2022-08-16 MED ORDER — 0.9 % SODIUM CHLORIDE (POUR BTL) OPTIME
TOPICAL | Status: DC | PRN
  Administered 2022-08-16: 1000 mL

## 2022-08-16 MED ORDER — PROPOFOL 10 MG/ML IV BOLUS
INTRAVENOUS | Status: DC | PRN
  Administered 2022-08-16: 200 mg via INTRAVENOUS

## 2022-08-16 MED ORDER — POVIDONE-IODINE 10 % EX SWAB
2.0000 | Freq: Once | CUTANEOUS | Status: AC
  Administered 2022-08-16: 2 via TOPICAL

## 2022-08-16 SURGICAL SUPPLY — 65 items
ADH SKN CLS APL DERMABOND .7 (GAUZE/BANDAGES/DRESSINGS) ×1
APL PRP STRL LF DISP 70% ISPRP (MISCELLANEOUS) ×2
BAG COUNTER SPONGE SURGICOUNT (BAG) IMPLANT
BAG SPNG CNTER NS LX DISP (BAG)
BLADE SAG 18X100X1.27 (BLADE) ×1 IMPLANT
BLADE SAW SAG 35X64 .89 (BLADE) ×1 IMPLANT
BNDG CMPR 5X3 CHSV STRCH STRL (GAUZE/BANDAGES/DRESSINGS) ×1
BNDG CMPR MED 10X6 ELC LF (GAUZE/BANDAGES/DRESSINGS) ×1
BNDG COHESIVE 3X5 TAN ST LF (GAUZE/BANDAGES/DRESSINGS) ×1 IMPLANT
BNDG ELASTIC 6X10 VLCR STRL LF (GAUZE/BANDAGES/DRESSINGS) ×1 IMPLANT
BOWL SMART MIX CTS (DISPOSABLE) ×1 IMPLANT
BSPLAT TIB 5D E CMNT STM LT (Knees) ×1 IMPLANT
CEMENT BONE REFOBACIN R1X40 US (Cement) IMPLANT
CHLORAPREP W/TINT 26 (MISCELLANEOUS) ×2 IMPLANT
COMP FEM CEMT PERSONA STD SZ7 (Knees) ×1 IMPLANT
COMPONENT FEM CMT PRSN STD SZ7 (Knees) IMPLANT
COVER SURGICAL LIGHT HANDLE (MISCELLANEOUS) ×1 IMPLANT
CUFF TOURN SGL QUICK 34 (TOURNIQUET CUFF) ×1
CUFF TRNQT CYL 34X4.125X (TOURNIQUET CUFF) ×1 IMPLANT
DERMABOND ADVANCED .7 DNX12 (GAUZE/BANDAGES/DRESSINGS) ×1 IMPLANT
DRAPE INCISE IOBAN 85X60 (DRAPES) ×1 IMPLANT
DRAPE SHEET LG 3/4 BI-LAMINATE (DRAPES) ×1 IMPLANT
DRAPE U-SHAPE 47X51 STRL (DRAPES) ×1 IMPLANT
DRESSING AQUACEL AG SP 3.5X10 (GAUZE/BANDAGES/DRESSINGS) ×1 IMPLANT
DRSG AQUACEL AG ADV 3.5X10 (GAUZE/BANDAGES/DRESSINGS) IMPLANT
DRSG AQUACEL AG SP 3.5X10 (GAUZE/BANDAGES/DRESSINGS) ×1
ELECT REM PT RETURN 15FT ADLT (MISCELLANEOUS) ×1 IMPLANT
GAUZE SPONGE 4X4 12PLY STRL (GAUZE/BANDAGES/DRESSINGS) ×1 IMPLANT
GLOVE BIO SURGEON STRL SZ 6.5 (GLOVE) ×2 IMPLANT
GLOVE BIOGEL PI IND STRL 6.5 (GLOVE) ×1 IMPLANT
GLOVE BIOGEL PI IND STRL 8 (GLOVE) ×1 IMPLANT
GLOVE SURG ORTHO 8.0 STRL STRW (GLOVE) ×2 IMPLANT
GOWN STRL REUS W/ TWL XL LVL3 (GOWN DISPOSABLE) ×2 IMPLANT
GOWN STRL REUS W/TWL XL LVL3 (GOWN DISPOSABLE) ×4
HANDPIECE INTERPULSE COAX TIP (DISPOSABLE) ×1
HDLS TROCR DRIL PIN KNEE 75 (PIN) ×1
HOLDER FOLEY CATH W/STRAP (MISCELLANEOUS) ×1 IMPLANT
HOOD PEEL AWAY T7 (MISCELLANEOUS) ×3 IMPLANT
MANIFOLD NEPTUNE II (INSTRUMENTS) ×1 IMPLANT
MARKER SKIN DUAL TIP RULER LAB (MISCELLANEOUS) ×1 IMPLANT
NS IRRIG 1000ML POUR BTL (IV SOLUTION) ×1 IMPLANT
PACK TOTAL KNEE CUSTOM (KITS) ×1 IMPLANT
PIN DRILL HDLS TROCAR 75 4PK (PIN) IMPLANT
PROTECTOR NERVE ULNAR (MISCELLANEOUS) ×1 IMPLANT
SCREW HEADED 33MM KNEE (MISCELLANEOUS) IMPLANT
SET HNDPC FAN SPRY TIP SCT (DISPOSABLE) ×1 IMPLANT
SOLUTION IRRIG SURGIPHOR (IV SOLUTION) IMPLANT
SPIKE FLUID TRANSFER (MISCELLANEOUS) ×1 IMPLANT
STEM POLY PAT PLY 32M KNEE (Knees) IMPLANT
STEM TIBIA 5 DEG SZ E L KNEE (Knees) IMPLANT
STEM TIBIAL SZ6-7 EF 12 LT (Knees) IMPLANT
STRIP CLOSURE SKIN 1/2X4 (GAUZE/BANDAGES/DRESSINGS) ×1 IMPLANT
SUT MNCRL AB 3-0 PS2 18 (SUTURE) ×1 IMPLANT
SUT STRATAFIX 0 PDS 27 VIOLET (SUTURE) ×1
SUT STRATAFIX PDO 1 14 VIOLET (SUTURE) ×1
SUT STRATFX PDO 1 14 VIOLET (SUTURE) ×1
SUT VIC AB 2-0 CT2 27 (SUTURE) ×2 IMPLANT
SUTURE STRATFX 0 PDS 27 VIOLET (SUTURE) ×1 IMPLANT
SUTURE STRATFX PDO 1 14 VIOLET (SUTURE) ×1 IMPLANT
SYR 50ML LL SCALE MARK (SYRINGE) ×1 IMPLANT
TIBIA STEM 5 DEG SZ E L KNEE (Knees) ×1 IMPLANT
TRAY FOLEY MTR SLVR 14FR STAT (SET/KITS/TRAYS/PACK) IMPLANT
TUBE SUCTION HIGH CAP CLEAR NV (SUCTIONS) ×1 IMPLANT
UNDERPAD 30X36 HEAVY ABSORB (UNDERPADS AND DIAPERS) ×1 IMPLANT
WRAP KNEE MAXI GEL POST OP (GAUZE/BANDAGES/DRESSINGS) IMPLANT

## 2022-08-16 NOTE — Interval H&P Note (Signed)

## 2022-08-16 NOTE — Anesthesia Procedure Notes (Signed)
Procedure Name: LMA Insertion Date/Time: 08/16/2022 10:03 AM  Performed by: Cynda Familia, CRNAPre-anesthesia Checklist: Patient identified, Emergency Drugs available, Suction available and Patient being monitored Patient Re-evaluated:Patient Re-evaluated prior to induction Oxygen Delivery Method: Circle System Utilized Preoxygenation: Pre-oxygenation with 100% oxygen Induction Type: IV induction Ventilation: Mask ventilation without difficulty LMA: LMA inserted and LMA with gastric port inserted LMA Size: 4.0 Number of attempts: 1 Airway Equipment and Method: Bite block Placement Confirmation: positive ETCO2 Tube secured with: Tape Dental Injury: Teeth and Oropharynx as per pre-operative assessment  Comments: IV induction Rose-- LMA insertion AM CRNA atraumatic-- teeth as preop-- small rub on left lower lip-- pressure applied and ointment-- bilat BS

## 2022-08-16 NOTE — Anesthesia Preprocedure Evaluation (Signed)
Anesthesia Evaluation  Patient identified by MRN, date of birth, ID band Patient awake    Reviewed: Allergy & Precautions, H&P , NPO status , Patient's Chart, lab work & pertinent test results  Airway Mallampati: II  TM Distance: >3 FB Neck ROM: Full    Dental no notable dental hx.    Pulmonary neg pulmonary ROS, former smoker   Pulmonary exam normal breath sounds clear to auscultation       Cardiovascular hypertension, Pt. on medications Normal cardiovascular exam Rhythm:Regular Rate:Normal  1/23  1. Left ventricular ejection fraction, by estimation, is 60 to 65%. The  left ventricle has normal function. The left ventricle has no regional  wall motion abnormalities. Left ventricular diastolic parameters are  consistent with Grade I diastolic  dysfunction (impaired relaxation). The average left ventricular global  longitudinal strain is -20.3 %. The global longitudinal strain is normal.   2. Right ventricular systolic function is normal. The right ventricular  size is normal. Tricuspid regurgitation signal is inadequate for assessing  PA pressure.   3. The mitral valve is normal in structure. No evidence of mitral valve  regurgitation. No evidence of mitral stenosis.   4. The aortic valve is tricuspid. Aortic valve regurgitation is not  visualized. No aortic stenosis is present.   5. The inferior vena cava is normal in size with greater than 50%  respiratory variability, suggesting right atrial pressure of 3 mmH     Neuro/Psych negative neurological ROS  negative psych ROS   GI/Hepatic negative GI ROS, Neg liver ROS,,,  Endo/Other  obesity  Renal/GU negative Renal ROS  negative genitourinary   Musculoskeletal  (+) Arthritis , Osteoarthritis,    Abdominal   Peds negative pediatric ROS (+)  Hematology negative hematology ROS (+)   Anesthesia Other Findings   Reproductive/Obstetrics negative OB ROS                              Anesthesia Physical Anesthesia Plan  ASA: 2  Anesthesia Plan: General   Post-op Pain Management: Regional block*   Induction: Intravenous  PONV Risk Score and Plan: 3 and Ondansetron, Dexamethasone and Treatment may vary due to age or medical condition  Airway Management Planned: LMA  Additional Equipment:   Intra-op Plan:   Post-operative Plan: Extubation in OR  Informed Consent: I have reviewed the patients History and Physical, chart, labs and discussed the procedure including the risks, benefits and alternatives for the proposed anesthesia with the patient or authorized representative who has indicated his/her understanding and acceptance.     Dental advisory given  Plan Discussed with: CRNA and Surgeon  Anesthesia Plan Comments:        Anesthesia Quick Evaluation

## 2022-08-16 NOTE — Anesthesia Postprocedure Evaluation (Signed)
Anesthesia Post Note  Patient: Angie [name]  Procedure(s) Performed: TOTAL KNEE ARTHROPLASTY (Left: Knee)     Patient location during evaluation: PACU Anesthesia Type: General Level of consciousness: awake and alert Pain management: pain level controlled Vital Signs Assessment: post-procedure vital signs reviewed and stable Respiratory status: spontaneous breathing, nonlabored ventilation, respiratory function stable and patient connected to nasal cannula oxygen Cardiovascular status: blood pressure returned to baseline and stable Postop Assessment: no apparent nausea or vomiting Anesthetic complications: no  No notable events documented.  Last Vitals:  Vitals:   08/16/22 1245 08/16/22 1300  BP: 138/71 (!) 155/79  Pulse: 80 78  Resp: 11 10  Temp:  36.4 C  SpO2: 97% 98%    Last Pain:  Vitals:   08/16/22 1300  TempSrc:   PainSc: Asleep                 Indica Marcott S

## 2022-08-16 NOTE — Progress Notes (Signed)
Orthopedic Tech Progress Note Patient Details:  Angie [name] 30-Jul-1945 [id number]  Patient ID: Angie [name], female   DOB: 07/16/1946, 77 y.o.   MRN: [id number]  Angie [name] 08/16/2022, 12:23 PM Left bone foam applied in pacu

## 2022-08-16 NOTE — Transfer of Care (Signed)
Immediate Anesthesia Transfer of Care Note  Patient: Angie [name]  Procedure(s) Performed: TOTAL KNEE ARTHROPLASTY (Left: Knee)  Patient Location: PACU  Anesthesia Type:General  Level of Consciousness: awake and alert   Airway & Oxygen Therapy: Patient Spontanous Breathing and Patient connected to face mask oxygen  Post-op Assessment: Report given to RN and Post -op Vital signs reviewed and stable  Post vital signs: Reviewed and stable  Last Vitals:  Vitals Value Taken Time  BP 142/70 08/16/22 1209  Temp    Pulse 80 08/16/22 1210  Resp 10 08/16/22 1210  SpO2 100 % 08/16/22 1210  Vitals shown include unvalidated device data.  Last Pain:  Vitals:   08/16/22 0940  TempSrc:   PainSc: 0-No pain         Complications: No notable events documented.

## 2022-08-16 NOTE — Anesthesia Procedure Notes (Signed)
Anesthesia Procedure Image       

## 2022-08-16 NOTE — Discharge Instructions (Signed)
INSTRUCTIONS AFTER JOINT REPLACEMENT   Remove items at home which could result in a fall. This includes throw rugs or furniture in walking pathways ICE to the affected joint every three hours while awake for 30 minutes at a time, for at least the first 3-5 days, and then as needed for pain and swelling.  Continue to use ice for pain and swelling. You may notice swelling that will progress down to the foot and ankle.  This is normal after surgery.  Elevate your leg when you are not up walking on it.   Continue to use the breathing machine you got in the hospital (incentive spirometer) which will help keep your temperature down.  It is common for your temperature to cycle up and down following surgery, especially at night when you are not up moving around and exerting yourself.  The breathing machine keeps your lungs expanded and your temperature down.   DIET:  As you were doing prior to hospitalization, we recommend a well-balanced diet.  DRESSING / WOUND CARE / SHOWERING  Keep the surgical dressing until follow up.  The dressing is water proof, so you can shower without any extra covering.  IF THE DRESSING FALLS OFF or the wound gets wet inside, change the dressing with sterile gauze.  Please use good hand washing techniques before changing the dressing.  Do not use any lotions or creams on the incision until instructed by your surgeon.    ACTIVITY  Increase activity slowly as tolerated, but follow the weight bearing instructions below.   No driving for 6 weeks or until further direction given by your physician.  You cannot drive while taking narcotics.  No lifting or carrying greater than 10 lbs. until further directed by your surgeon. Avoid periods of inactivity such as sitting longer than an hour when not asleep. This helps prevent blood clots.  You may return to work once you are authorized by your doctor.     WEIGHT BEARING   Weight bearing as tolerated with assist device (walker, cane,  etc) as directed, use it as long as suggested by your surgeon or therapist, typically at least 4-6 weeks.   EXERCISES  Results after joint replacement surgery are often greatly improved when you follow the exercise, range of motion and muscle strengthening exercises prescribed by your doctor. Safety measures are also important to protect the joint from further injury. Any time any of these exercises cause you to have increased pain or swelling, decrease what you are doing until you are comfortable again and then slowly increase them. If you have problems or questions, call your caregiver or physical therapist for advice.   Rehabilitation is important following a joint replacement. After just a few days of immobilization, the muscles of the leg can become weakened and shrink (atrophy).  These exercises are designed to build up the tone and strength of the thigh and leg muscles and to improve motion. Often times heat used for twenty to thirty minutes before working out will loosen up your tissues and help with improving the range of motion but do not use heat for the first two weeks following surgery (sometimes heat can increase post-operative swelling).   These exercises can be done on a training (exercise) mat, on the floor, on a table or on a bed. Use whatever works the best and is most comfortable for you.    Use music or television while you are exercising so that the exercises are a pleasant break in your  day. This will make your life better with the exercises acting as a break in your routine that you can look forward to.   Perform all exercises about fifteen times, three times per day or as directed.  You should exercise both the operative leg and the other leg as well.  Exercises include:   Quad Sets - Tighten up the muscle on the front of the thigh (Quad) and hold for 5-10 seconds.   Straight Leg Raises - With your knee straight (if you were given a brace, keep it on), lift the leg to 60  degrees, hold for 3 seconds, and slowly lower the leg.  Perform this exercise against resistance later as your leg gets stronger.  Leg Slides: Lying on your back, slowly slide your foot toward your buttocks, bending your knee up off the floor (only go as far as is comfortable). Then slowly slide your foot back down until your leg is flat on the floor again.  Angel Wings: Lying on your back spread your legs to the side as far apart as you can without causing discomfort.  Hamstring Strength:  Lying on your back, push your heel against the floor with your leg straight by tightening up the muscles of your buttocks.  Repeat, but this time bend your knee to a comfortable angle, and push your heel against the floor.  You may put a pillow under the heel to make it more comfortable if necessary.   A rehabilitation program following joint replacement surgery can speed recovery and prevent re-injury in the future due to weakened muscles. Contact your doctor or a physical therapist for more information on knee rehabilitation.    CONSTIPATION  Constipation is defined medically as fewer than three stools per week and severe constipation as less than one stool per week.  Even if you have a regular bowel pattern at home, your normal regimen is likely to be disrupted due to multiple reasons following surgery.  Combination of anesthesia, postoperative narcotics, change in appetite and fluid intake all can affect your bowels.   YOU MUST use at least one of the following options; they are listed in order of increasing strength to get the job done.  They are all available over the counter, and you may need to use some, POSSIBLY even all of these options:    Drink plenty of fluids (prune juice may be helpful) and high fiber foods Colace 100 mg by mouth twice a day  Senokot for constipation as directed and as needed Dulcolax (bisacodyl), take with full glass of water  Miralax (polyethylene glycol) once or twice a day as  needed.  If you have tried all these things and are unable to have a bowel movement in the first 3-4 days after surgery call either your surgeon or your primary doctor.    If you experience loose stools or diarrhea, hold the medications until you stool forms back up.  If your symptoms do not get better within 1 week or if they get worse, check with your doctor.  If you experience "the worst abdominal pain ever" or develop nausea or vomiting, please contact the office immediately for further recommendations for treatment.   ITCHING:  If you experience itching with your medications, try taking only a single pain pill, or even half a pain pill at a time.  You can also use Benadryl over the counter for itching or also to help with sleep.   TED HOSE STOCKINGS:  Use stockings on both  legs until for at least 2 weeks or as directed by physician office. They may be removed at night for sleeping.  MEDICATIONS:  See your medication summary on the "After Visit Summary" that nursing will review with you.  You may have some home medications which will be placed on hold until you complete the course of blood thinner medication.  It is important for you to complete the blood thinner medication as prescribed.   Blood clot prevention (DVT Prophylaxis): After surgery you are at an increased risk for a blood clot. you were prescribed a blood thinner, Aspirin 81mg, to be taken twice daily for a total of 4 weeks from surgery to help reduce your risk of getting a blood clot. This will help prevent a blood clot. Signs of a pulmonary embolus (blood clot in the lungs) include sudden short of breath, feeling lightheaded or dizzy, chest pain with a deep breath, rapid pulse rapid breathing. Signs of a blood clot in your arms or legs include new unexplained swelling and cramping, warm, red or darkened skin around the painful area. Please call the office or 911 right away if these signs or symptoms develop.  PRECAUTIONS:  If you  experience chest pain or shortness of breath - call 911 immediately for transfer to the hospital emergency department.   If you develop a fever greater that 101 F, purulent drainage from wound, increased redness or drainage from wound, foul odor from the wound/dressing, or calf pain - CONTACT YOUR SURGEON.                                                   FOLLOW-UP APPOINTMENTS:  If you do not already have a post-op appointment, please call the office for an appointment to be seen by your surgeon.  Guidelines for how soon to be seen are listed in your "After Visit Summary", but are typically between 2-3 weeks after surgery.  OTHER INSTRUCTIONS:   Knee Replacement:  Do not place pillow under knee, focus on keeping the knee straight while resting.  DO NOT modify, tear, cut, or change the foam block in any way.  POST-OPERATIVE OPIOID TAPER INSTRUCTIONS: It is important to wean off of your opioid medication as soon as possible. If you do not need pain medication after your surgery it is ok to stop day one. Opioids include: Codeine, Hydrocodone(Norco, Vicodin), Oxycodone(Percocet, oxycontin) and hydromorphone amongst others.  Long term and even short term use of opiods can cause: Increased pain response Dependence Constipation Depression Respiratory depression And more.  Withdrawal symptoms can include Flu like symptoms Nausea, vomiting And more Techniques to manage these symptoms Hydrate well Eat regular healthy meals Stay active Use relaxation techniques(deep breathing, meditating, yoga) Do Not substitute Alcohol to help with tapering If you have been on opioids for less than two weeks and do not have pain than it is ok to stop all together.  Plan to wean off of opioids This plan should start within one week post op of your joint replacement. Maintain the same interval or time between taking each dose and first decrease the dose.  Cut the total daily intake of opioids by one tablet  each day Next start to increase the time between doses. The last dose that should be eliminated is the evening dose.   MAKE SURE YOU:  Understand these instructions.    Get help right away if you are not doing well or get worse.    Thank you for letting us be a part of your medical care team.  It is a privilege we respect greatly.  We hope these instructions will help you stay on track for a fast and full recovery!

## 2022-08-16 NOTE — Op Note (Signed)
DATE OF SURGERY:  08/16/2022 TIME: 12:04 PM  PATIENT NAME:  Angie [name]   AGE: 77 y.o.    PRE-OPERATIVE DIAGNOSIS: End-stage left knee osteoarthritis  POST-OPERATIVE DIAGNOSIS:  Same  PROCEDURE:  Left Total Knee Arthroplasty  SURGEON:  Angie Kirst A Kawehi Hostetter, MD   ASSISTANT: Angie [name], RNFA, present and scrubbed throughout the case, critical for assistance with exposure, retraction, instrumentation, and closure.   OPERATIVE IMPLANTS:  Cemented Zimmer persona left size 7 standard femur, E tibial baseplate, 32 mm patella, 12 mm MC polyethylene liner Implant Name Type Inv. Item Serial No. Manufacturer Lot No. LRB No. Used Action  CEMENT BONE REFOBACIN R1X40 [place] - [id number] Cement CEMENT BONE REFOBACIN R1X40 [place]  ZIMMER RECON(ORTH,TRAU,BIO,SG) [id number] Left 1 Implanted  TIBIA STEM 5 DEG SZ E L KNEE - [id number] Knees TIBIA STEM 5 DEG SZ E L KNEE  ZIMMER RECON(ORTH,TRAU,BIO,SG) [id number] Left 1 Implanted  COMP FEM CEMT PERSONA STD SZ7 - [id number] Knees COMP FEM CEMT PERSONA STD SZ7  ZIMMER RECON(ORTH,TRAU,BIO,SG) [id number] Left 1 Implanted  STEM POLY PAT PLY 43M KNEE - [id number] Knees STEM POLY PAT PLY 43M KNEE  ZIMMER RECON(ORTH,TRAU,BIO,SG) [id number] Left 1 Implanted  CEMENT BONE REFOBACIN R1X40 [place] - [id number] Cement CEMENT BONE REFOBACIN R1X40 [place]  ZIMMER RECON(ORTH,TRAU,BIO,SG) [id number] Left 1 Implanted  STEM TIBIAL SZ6-7 EF 12 LT - [id number] Knees STEM TIBIAL SZ6-7 EF 12 LT  ZIMMER RECON(ORTH,TRAU,BIO,SG) [id number] Left 1 Implanted      PREOPERATIVE INDICATIONS:  Angie [name] is a 77 y.o. year old female with end stage bone on bone degenerative arthritis of the knee who failed conservative treatment, including injections, antiinflammatories, activity modification, and assistive devices, and had significant impairment of their activities of daily living, and elected for Total Knee Arthroplasty.   The risks, benefits, and alternatives were discussed at length  including but not limited to the risks of infection, bleeding, nerve injury, stiffness, blood clots, the need for revision surgery, cardiopulmonary complications, among others, and they were willing to proceed.  ESTIMATED BLOOD LOSS: 25cc  OPERATIVE DESCRIPTION:   Once adequate anesthesia was induced, preoperative antibiotics, 2 gm of ancef,1 gm of Tranexamic Acid, and 8 mg of Decadron administered, the patient was positioned supine with a left thigh tourniquet placed.  The left lower extremity was prepped and draped in sterile fashion.  A time-  out was performed identifying the patient, planned procedure, and the appropriate extremity.     The leg was  exsanguinated, tourniquet elevated to 250 mmHg.  A midline incision was  made followed by median parapatellar arthrotomy. Anterior horn of the medial meniscus was released and resected. A medial release was performed, the infrapatellar fat pad was resected with care taken to protect the patellar tendon. The suprapatellar fat was removed to exposed the distal anterior femur. The anterior horn of the lateral meniscus and ACL were released.    Following initial  exposure, I first started with the femur  The femoral  canal was opened with a drill, canal was suctioned to try to prevent fat emboli.  An  intramedullary rod was passed set at 5 degrees valgus, 11 mm. The distal femur was resected.  Following this resection, the tibia was  subluxated anteriorly.  Using the extramedullary guide, 10 mm of bone was resected off   the proximal lateral tibia.  We confirmed the gap would be  stable medially and laterally with a size 74m spacer block as well as confirmed that the tibial cut was perpendicular in the coronal plane,  checking with an alignment rod.    Once this was done, the posterior femoral referencing femoral sizer was placed under to the posterior condyles with 3 degrees of external rotational which was parallel to the transepicondylar axis and  perpendicular to [employer]. The femur was sized to be a size E in the anterior-  posterior dimension. The  anterior, posterior, and  chamfer cuts were made without difficulty nor   notching making certain that I was along the anterior cortex to help  with flexion gap stability. Next a laminar spreader was placed with the knee in flexion and the medial lateral menisci were resected.  5 cc of the Exparel mixture was injected in the medial side of the back of the knee and 3 cc in the lateral side.  1/2 inch curved osteotome was used to resect posterior osteophyte that was then removed with a pituitary rongeur.       At this point, the tibia was sized to be a size 7.  The size E tray was  then pinned in position. Trial reduction was now carried with a 7 femur, E tibia, a 10 mm MC insert.  There was mild hyperextension varus valgus laxity with a 10 mm polyethylene insert.  Upsized to a 12 mm insert.  The knee had full extension and was stable to varus valgus stress in extension.  The knee was slightly tight in flexion and the PCL was partially released.   Attention was next directed to the patella.  Precut  measurement was noted to be 21 mm.  I resected down to 13 mm and used a  33m patellar button to restore patellar height as well as cover the cut surface.     The patella lug holes were drilled and a 323mpatella poly trial was placed.    The knee was brought to full extension with good flexion stability with the patella tracking through the trochlea without application of pressure.     Next the femoral component was again assessed and determined to be seated and appropriately lateralized.  The femoral lug holes were drilled.  The femoral component was then removed. Tibial component was again assessed and felt to be seated and appropriately rotated with the medial third of the tubercle. The tibia was then drilled, and keel punched.     Final components were  opened and antibiotic cement was mixed.       Final implants were then  cemented onto cleaned and dried cut surfaces of bone with the knee brought to extension with a 1225mC poly.  The knee was irrigated with sterile Betadine diluted in saline as well as pulse lavage normal saline.  The synovial lining was  then injected a dilute Exparel.      Once the cement had fully cured, excess cement was removed throughout the knee.  I confirmed that I was satisfied with the range of motion and stability, and the final 12 mm MC poly insert was chosen.  It was placed into the knee.         The tourniquet had been let down.  No significant hemostasis was required.  The medial parapatellar arthrotomy was then reapproximated using #1 Stratafix sutures with the knee  in flexion.  The remaining wound was closed with 0 stratafix, 2-0 Vicryl, and running 3-0 Monocryl. The knee was cleaned, dried, dressed sterilely using Dermabond and   Aquacel dressing.  The patient was then brought to recovery room in stable condition, tolerating  the procedure  well. There were no complications.   Post op recs: WB: WBAT Abx: ancef Imaging: PACU xrays DVT prophylaxis: Aspirin '81mg'$  BID x4 weeks Follow up: 2 weeks after surgery for a wound check with Dr. Zachery Dakins at [facility].  Address: [address], [place], [place] [address]  Office Phone: [phone]  Charlies Constable, MD Orthopaedic Surgery

## 2022-08-16 NOTE — Evaluation (Signed)
Physical Therapy Evaluation Patient Details Name: Angie [name] MRN: [id number] DOB: [birth year] Today's Date: 08/16/2022  History of Present Illness  Pt is a 77yo female presenting s/p L-TKA on 08/16/22. PMH: BPPV, chronic back pain, GERD, HLD, HTN, bilateral RCR, R-THA 2023, Lumbar fusion L4-S1   Clinical Impression  Angie [name] is a 77 y.o. female POD 0 s/p L-TKA. Patient reports independence with mobility at baseline. Patient is now limited by functional impairments (see PT problem list below) and requires min assist for bed mobility and for transfers. Patient was able to ambulate 20 feet with RW and min guard level of assist. Patient instructed in exercise to facilitate ROM and circulation to manage edema. Provided incentive spirometer and with Vcs pt able to achieve 1585m. Patient will benefit from continued skilled PT interventions to address impairments and progress towards PLOF. Acute PT will follow to progress mobility and stair training in preparation for safe discharge home.       Recommendations for follow up therapy are one component of a multi-disciplinary discharge planning process, led by the attending physician.  Recommendations may be updated based on patient status, additional functional criteria and insurance authorization.  Follow Up Recommendations Follow physician's recommendations for discharge plan and follow up therapies      Assistance Recommended at Discharge Frequent or constant Supervision/Assistance  Patient can return home with the following  A little help with walking and/or transfers;A little help with bathing/dressing/bathroom;Assistance with cooking/housework;Help with stairs or ramp for entrance;Assist for transportation    Equipment Recommendations None recommended by PT  Recommendations for Other Services       Functional Status Assessment Patient has had a recent decline in their functional status and demonstrates the ability to make  significant improvements in function in a reasonable and predictable amount of time.     Precautions / Restrictions Precautions Precautions: Fall;Knee Precaution Comments: no pillow under the knee Restrictions Weight Bearing Restrictions: No Other Position/Activity Restrictions: wbat      Mobility  Bed Mobility Overal bed mobility: Needs Assistance Bed Mobility: Supine to Sit     Supine to sit: Min assist, HOB elevated     General bed mobility comments: Min assist to bring LLE off bed, elevate trunk    Transfers Overall transfer level: Needs assistance Equipment used: Rolling walker (2 wheels) Transfers: Sit to/from Stand Sit to Stand: Min assist           General transfer comment: For light lift assist    Ambulation/Gait Ambulation/Gait assistance: Min guard Gait Distance (Feet): 20 Feet Assistive device: Rolling walker (2 wheels) Gait Pattern/deviations: Step-to pattern Gait velocity: decreased     General Gait Details: Pt ambulated with RW to commode and back to recliner, no physical assist required, VCs for sequencing. Pt reporting mild dizzines that resolved quickly.  Stairs            Wheelchair Mobility    Modified Rankin (Stroke Patients Only)       Balance Overall balance assessment: Needs assistance Sitting-balance support: Feet supported, No upper extremity supported Sitting balance-Leahy Scale: Good     Standing balance support: Reliant on assistive device for balance, During functional activity, Bilateral upper extremity supported Standing balance-Leahy Scale: Poor                               Pertinent Vitals/Pain Pain Assessment Pain Assessment: 0-10 Pain Score: 5  Pain Location: left knee Pain Descriptors / Indicators:  Operative site guarding Pain Intervention(s): Limited activity within patient's tolerance, Monitored during session, Repositioned, Ice applied    Home Living Family/patient expects to be  discharged to:: Private residence Living Arrangements: Spouse/significant other Available Help at Discharge: Family;Available 24 hours/day Type of Home: House Home Access: Stairs to enter Entrance Stairs-Rails: Left (Front step has bilateral railings 7steps) Entrance Stairs-Number of Steps: 7   Home Layout: Able to live on main level with bedroom/bathroom Home Equipment: Conservation officer, nature (2 wheels);Cane - single point;BSC/3in1;Shower seat - built in      Prior Function Prior Level of Function : Independent/Modified Independent (Retirned Therapist, sports)             Mobility Comments: IND ADLs Comments: IND     Hand Dominance        Extremity/Trunk Assessment   Upper Extremity Assessment Upper Extremity Assessment: Overall WFL for tasks assessed    Lower Extremity Assessment Lower Extremity Assessment: RLE deficits/detail;LLE deficits/detail RLE Deficits / Details: MMT ank DF/PF 5/5 RLE Sensation: WNL LLE Deficits / Details: MMT ank DF/PF 5/5, no extensor lag noted but SLR very minimal, perhaps `" LLE Sensation: WNL    Cervical / Trunk Assessment Cervical / Trunk Assessment: Normal  Communication   Communication: No difficulties  Cognition Arousal/Alertness: Awake/alert Behavior During Therapy: WFL for tasks assessed/performed Overall Cognitive Status: Within Functional Limits for tasks assessed                                          General Comments General comments (skin integrity, edema, etc.): Husband Gwyndolyn Saxon present    Exercises Total Joint Exercises Ankle Circles/Pumps: AROM, Both, 20 reps   Assessment/Plan    PT Assessment Patient needs continued PT services  PT Problem List Decreased strength;Decreased range of motion;Decreased activity tolerance;Decreased balance;Decreased mobility;Pain       PT Treatment Interventions DME instruction;Gait training;Stair training;Functional mobility training;Therapeutic activities;Therapeutic  exercise;Balance training;Neuromuscular re-education;Patient/family education    PT Goals (Current goals can be found in the Care Plan section)  Acute Rehab PT Goals Patient Stated Goal: Go dancing PT Goal Formulation: With patient Time For Goal Achievement: 08/23/22 Potential to Achieve Goals: Good    Frequency 7X/week     Co-evaluation               AM-PAC PT "6 Clicks" Mobility  Outcome Measure Help needed turning from your back to your side while in a flat bed without using bedrails?: A Little Help needed moving from lying on your back to sitting on the side of a flat bed without using bedrails?: A Little Help needed moving to and from a bed to a chair (including a wheelchair)?: A Little Help needed standing up from a chair using your arms (e.g., wheelchair or bedside chair)?: A Little Help needed to walk in hospital room?: A Little Help needed climbing 3-5 steps with a railing? : A Little 6 Click Score: 18    End of Session Equipment Utilized During Treatment: Gait belt Activity Tolerance: Patient tolerated treatment well;No increased pain Patient left: in chair;with call bell/phone within reach;with chair alarm set;with family/visitor present;with SCD's reapplied Nurse Communication: Mobility status PT Visit Diagnosis: Pain;Difficulty in walking, not elsewhere classified (R26.2) Pain - Right/Left: Left Pain - part of body: Knee    Time: 5400-8676 PT Time Calculation (min) (ACUTE ONLY): 29 min   Charges:   PT Evaluation $PT Eval Low Complexity: 1  Low PT Treatments $Gait Training: 8-22 mins        Coolidge Breeze, PT, DPT [facility] Department Office: [phone] Weekend pager: [phone]  Coolidge Breeze 08/16/2022, 5:09 PM

## 2022-08-16 NOTE — Anesthesia Procedure Notes (Signed)
Anesthesia Regional Block: Adductor canal block   Pre-Anesthetic Checklist: , timeout performed,  Correct Patient, Correct Site, Correct Laterality,  Correct Procedure, Correct Position, site marked,  Risks and benefits discussed,  Surgical consent,  Pre-op evaluation,  At surgeon's request and post-op pain management  Laterality: Left  Prep: chloraprep       Needles:  Injection technique: Single-shot  Needle Type: Echogenic Needle     Needle Length: 9cm      Additional Needles:   Procedures:,,,, ultrasound used (permanent image in chart),,    Narrative:  Start time: 08/16/2022 9:39 AM End time: 08/16/2022 9:45 AM Injection made incrementally with aspirations every 5 mL.  Performed by: Personally  Anesthesiologist: Myrtie Soman, MD  Additional Notes: Patient tolerated the procedure well without complications

## 2022-08-17 ENCOUNTER — Encounter (HOSPITAL_COMMUNITY): Payer: Self-pay | Admitting: Orthopedic Surgery

## 2022-08-17 ENCOUNTER — Ambulatory Visit: Payer: Medicare Other | Admitting: Student

## 2022-08-17 DIAGNOSIS — Z96641 Presence of right artificial hip joint: Secondary | ICD-10-CM | POA: Diagnosis not present

## 2022-08-17 DIAGNOSIS — M1712 Unilateral primary osteoarthritis, left knee: Secondary | ICD-10-CM | POA: Diagnosis not present

## 2022-08-17 DIAGNOSIS — Z79899 Other long term (current) drug therapy: Secondary | ICD-10-CM | POA: Diagnosis not present

## 2022-08-17 DIAGNOSIS — I10 Essential (primary) hypertension: Secondary | ICD-10-CM | POA: Diagnosis not present

## 2022-08-17 DIAGNOSIS — Z87891 Personal history of nicotine dependence: Secondary | ICD-10-CM | POA: Diagnosis not present

## 2022-08-17 LAB — BASIC METABOLIC PANEL
Anion gap: 7 (ref 5–15)
BUN: 12 mg/dL (ref 8–23)
CO2: 28 mmol/L (ref 22–32)
Calcium: 8.7 mg/dL — ABNORMAL LOW (ref 8.9–10.3)
Chloride: 103 mmol/L (ref 98–111)
Creatinine, Ser: 0.64 mg/dL (ref 0.44–1.00)
GFR, Estimated: >60 mL/min (ref >60)
Glucose, Bld: 152 mg/dL — ABNORMAL HIGH (ref 70–99)
Potassium: 3.2 mmol/L — ABNORMAL LOW (ref 3.5–5.1)
Sodium: 138 mmol/L (ref 135–145)

## 2022-08-17 LAB — CBC
HCT: 34.6 % — ABNORMAL LOW (ref 36.0–46.0)
Hemoglobin: 11.4 g/dL — ABNORMAL LOW (ref 12.0–15.0)
MCH: 29.9 pg (ref 26.0–34.0)
MCHC: 32.9 g/dL (ref 30.0–36.0)
MCV: 90.8 fL (ref 80.0–100.0)
Platelets: 211 10*3/uL (ref 150–400)
RBC: 3.81 MIL/uL — ABNORMAL LOW (ref 3.87–5.11)
RDW: 13.9 % (ref 11.5–15.5)
WBC: 13 10*3/uL — ABNORMAL HIGH (ref 4.0–10.5)
nRBC: 0 % (ref 0.0–0.2)

## 2022-08-17 MED ORDER — ONDANSETRON HCL 4 MG PO TABS: 4.0000 mg | ORAL_TABLET | Freq: Three times a day (TID) | ORAL | 0 refills | Status: AC | PRN

## 2022-08-17 MED ORDER — ACETAMINOPHEN 500 MG PO TABS: 1000.0000 mg | ORAL_TABLET | Freq: Three times a day (TID) | ORAL | 0 refills | Status: AC | PRN

## 2022-08-17 MED ORDER — ASPIRIN 81 MG PO TBEC: 81.0000 mg | DELAYED_RELEASE_TABLET | Freq: Two times a day (BID) | ORAL | 0 refills | Status: AC

## 2022-08-17 MED ORDER — CELECOXIB 100 MG PO CAPS: 100.0000 mg | ORAL_CAPSULE | Freq: Two times a day (BID) | ORAL | 0 refills | Status: AC

## 2022-08-17 MED ORDER — TRAMADOL HCL 50 MG PO TABS
50.0000 mg | ORAL_TABLET | Freq: Four times a day (QID) | ORAL | Status: DC
  Administered 2022-08-17: 50 mg via ORAL
  Filled 2022-08-17: qty 1

## 2022-08-17 MED ORDER — METHOCARBAMOL 500 MG PO TABS: 500.0000 mg | ORAL_TABLET | Freq: Three times a day (TID) | ORAL | 0 refills | Status: AC | PRN

## 2022-08-17 MED ORDER — ORAL CARE MOUTH RINSE: 15.0000 mL | OROMUCOSAL | Status: DC | PRN

## 2022-08-17 MED ORDER — TRAMADOL HCL 50 MG PO TABS: 50.0000 mg | ORAL_TABLET | Freq: Four times a day (QID) | ORAL | 0 refills | Status: AC | PRN

## 2022-08-17 NOTE — Discharge Summary (Signed)
Physician Discharge Summary  Patient ID: Dayna Reid-White MRN: [id number] DOB/AGE: 1946-05-17 77 y.o.  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  Never received treatment.  No signs of active disease and low risk for reactivation.  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  Patient declines referral to health department to discuss at this time.    Pre-diabetes     Surgeries: Procedure(s): TOTAL KNEE ARTHROPLASTY on 08/16/2022   Consultants (if any):   Discharged Condition: Improved  Hospital Course: Alysse Reid-White is an 77 y.o. female who was admitted 08/16/2022 with a diagnosis of Localized osteoarthritis of left knee and went to the operating room on 08/16/2022 and underwent the above named procedures.    She was given perioperative antibiotics:  Anti-infectives (From admission, onward)    Start     Dose/Rate Route Frequency Ordered Stop   08/16/22 1615  ceFAZolin (ANCEF) IVPB 2g/100 mL premix        2 g 200 mL/hr over 30 Minutes Intravenous Every 6 hours 08/16/22 1524 08/16/22 2256   08/16/22 0815  ceFAZolin (ANCEF) IVPB 2g/100 mL premix        2 g 200 mL/hr over 30 Minutes Intravenous On call to O.R. 08/16/22 4970 08/16/22 1004     .  She was given sequential compression devices, early ambulation, and aspirin for DVT prophylaxis.  She benefited  maximally from the hospital stay and there were no complications.    Recent vital signs:  Vitals:   08/17/22 0527 08/17/22 0933  BP: 121/86 (!) 158/75  Pulse: 65 83  Resp: 16 14  Temp: 98.9 F (37.2 C)   SpO2: 98% 99%    Recent laboratory studies:  Lab Results  Component Value Date   HGB 11.4 (L) 08/17/2022   HGB 13.7 08/04/2022   HGB 12.0 08/23/2021   Lab Results  Component Value Date   WBC 13.0 (H) 08/17/2022   PLT 211 08/17/2022   No results found for: "INR" Lab Results  Component Value Date   NA 138 08/17/2022   K 3.2 (L) 08/17/2022   CL 103 08/17/2022   CO2 28 08/17/2022   BUN 12 08/17/2022   CREATININE 0.64 08/17/2022   GLUCOSE 152 (H) 08/17/2022    Discharge Medications:   Allergies as of 08/17/2022   No Known Allergies      Medication List     TAKE these medications    acetaminophen 500 MG tablet Commonly known as: TYLENOL Take 500 mg by mouth every 6 (six) hours as needed for moderate pain. What changed: Another medication with the same name was added. Make sure you understand how and when to take each.   acetaminophen 500 MG tablet Commonly known as: TYLENOL Take 2 tablets (1,000 mg total) by mouth every 8 (eight) hours as needed. What changed: You were already taking  a medication with the same name, and this prescription was added. Make sure you understand how and when to take each.   aspirin EC 81 MG tablet Take 1 tablet (81 mg total) by mouth 2 (two) times daily for 28 days. Swallow whole. Start taking on: August 18, 2022   benzonatate 100 MG capsule Commonly known as: TESSALON Take 1 capsule (100 mg total) by mouth 2 (two) times daily as needed for cough.   Cal Mag Zinc +D3 Tabs Take 2 tablets by mouth daily.   celecoxib 100 MG capsule Commonly known as: CeleBREX Take 1 capsule (100 mg total) by mouth 2 (two) times daily for 14 days.   cycloSPORINE 0.05 % ophthalmic emulsion Commonly known as: RESTASIS Place 1 drop into both  eyes 2 (two) times daily.   diclofenac Sodium 1 % Gel Commonly known as: VOLTAREN Apply 2 g topically 2 (two) times daily as needed (pain).   famotidine 40 MG tablet Commonly known as: PEPCID Take 1 tablet (40 mg total) by mouth daily.   guaifenesin 400 MG Tabs tablet Commonly known as: HUMIBID E Take 400 mg by mouth every 6 (six) hours as needed (congestion).   hydrochlorothiazide 25 MG tablet Commonly known as: HYDRODIURIL Take 1 tablet (25 mg total) by mouth daily.   levocetirizine 5 MG tablet Commonly known as: XYZAL Take 1 tablet (5 mg total) by mouth every evening.   melatonin 3 MG Tabs tablet Take 3 mg by mouth at bedtime as needed (sleep).   methocarbamol 500 MG tablet Commonly known as: ROBAXIN Take 1 tablet (500 mg total) by mouth every 8 (eight) hours as needed for up to 10 days for muscle spasms.   mometasone 50 MCG/ACT nasal spray Commonly known as: NASONEX Place 2 sprays into the nose daily.   multivitamin capsule Take 1 capsule by mouth See admin instructions. Alternate taking 1 tablet daily for one month then switch to ocuvite multivitamin daily for one month   OCUVITE PO Take 1 tablet by mouth See admin instructions. Alternate taking 1 tablet daily for one month then switch to regular multivitamin daily for one month   ondansetron 4 MG tablet Commonly known as: Zofran Take 1 tablet (4 mg total) by mouth every 8 (eight) hours as needed for up to 14 days for nausea or vomiting.   pravastatin 40 MG tablet Commonly known as: PRAVACHOL Take 1 tablet (40 mg total) by mouth daily.   pregabalin 100 MG capsule Commonly known as: Lyrica Take 1 capsule (100 mg total) by mouth 2 (two) times daily.   senna-docusate 8.6-50 MG tablet Commonly known as: Senokot-S Take 1 tablet by mouth daily as needed for mild constipation.   SUMAtriptan 25 MG tablet Commonly known as: IMITREX Take 1 tab for Migraine headache. May repeat dose 2-3 hrs later if there is no  improvement, not more than 100 mg per day.   tamsulosin 0.4 MG Caps capsule Commonly known as: FLOMAX Take 1 capsule (0.4 mg total) by mouth daily.   tiZANidine 4 MG tablet Commonly known as: ZANAFLEX Take 1 tablet (4 mg total) by mouth at bedtime as needed for muscle spasms.   traMADol 50 MG tablet Commonly known as: ULTRAM Take 1-2 tablets (50-100 mg total) by mouth every 6 (six) hours as needed for severe pain or moderate pain. What changed: how much to take   traMADol 50 MG tablet Commonly known as: Ultram Take 1 tablet (50 mg total) by mouth every 6 (six) hours as  needed for up to 7 days. What changed: You were already taking a medication with the same name, and this prescription was added. Make sure you understand how and when to take each.   valACYclovir 500 MG tablet Commonly known as: VALTREX Take 1 tablet (500 mg total) by mouth daily.        Diagnostic Studies: DG Knee Left Port  Result Date: 08/16/2022 CLINICAL DATA:  Postoperative state.  Left knee arthroplasty. EXAM: PORTABLE LEFT KNEE - 2 VIEW COMPARISON:  None Available. FINDINGS: Left total knee arthroplasty noted. The lateral view is slightly oblique. Femoral and tibial components are well seated. The joint is located. Gas and fluid are present in the joint. IMPRESSION: Left total knee arthroplasty without radiographic evidence for complication. Electronically Signed   By: San Morelle M.D.   On: 08/16/2022 12:38   DG Chest 2 View  Result Date: 08/07/2022 CLINICAL DATA:  77 year old female with cough and wheezing EXAM: CHEST - 2 VIEW COMPARISON:  08/06/2015 FINDINGS: Cardiomediastinal silhouette unchanged in size and contour. No evidence of central vascular congestion. No interlobular septal thickening. Interval resolution of airspace disease on the prior plain from from 2017. No new airspace disease. No pneumothorax or pleural effusion. Similar coarsened interstitial markings. No acute displaced fracture.  Degenerative changes of the spine. IMPRESSION: Negative for acute cardiopulmonary disease Electronically Signed   By: Corrie Mckusick D.O.   On: 08/07/2022 13:00    Disposition: Discharge disposition: 01-Home or Self Care       Discharge Instructions     Call MD / Call 911   Complete by: As directed    If you experience chest pain or shortness of breath, CALL 911 and be transported to the hospital emergency room.  If you develope a fever above 101 F, pus (white drainage) or increased drainage or redness at the wound, or calf pain, call your surgeon's office.   Constipation Prevention   Complete by: As directed    Drink plenty of fluids.  Prune juice may be helpful.  You may use a stool softener, such as Colace (over the counter) 100 mg twice a day.  Use MiraLax (over the counter) for constipation as needed.   Diet - low sodium heart healthy   Complete by: As directed    Increase activity slowly as tolerated   Complete by: As directed    Post-operative opioid taper instructions:   Complete by: As directed    POST-OPERATIVE OPIOID TAPER INSTRUCTIONS: It is important to wean off of your opioid medication as soon as possible. If you do not need pain medication after your surgery it is ok to stop day one. Opioids include: Codeine, Hydrocodone(Norco, Vicodin), Oxycodone(Percocet, oxycontin) and hydromorphone amongst others.  Long term and even short term use of opiods can cause: Increased pain response Dependence Constipation Depression Respiratory depression And more.  Withdrawal symptoms can include Flu like symptoms Nausea, vomiting And more Techniques to manage these symptoms Hydrate well Eat regular healthy meals Stay active Use relaxation techniques(deep breathing, meditating, yoga) Do Not substitute Alcohol to help with tapering If you have been on opioids for less than two weeks and do not have pain than it is ok to stop all together.  Plan to wean off of opioids This  plan should start within one week post op of your joint replacement. Maintain the same interval or time between taking each dose and first decrease the dose.  Cut the total daily intake of opioids by one tablet each  day Next start to increase the time between doses. The last dose that should be eliminated is the evening dose.           Follow-up Information     Willaim Sheng, MD. Go on 08/31/2022.   Specialty: Orthopedic Surgery Why: your appointment is scheduled for 2:45. Contact information: [address] [place] [address] [phone]         [facility] Follow up.   Specialty: [place] Why: HHPT will provide 6 home visits prior to starting outpatient physical therapy Contact information: [address] [place]  [address] [phone]         [facility], [place]. Go on 09/01/2022.   Why: your therapy appointment is scheduled for 11:45. Please arrive at 11:30 to complete your paperwork Contact information: [facility] [place] [id number] [phone]                    Discharge Instructions      INSTRUCTIONS AFTER JOINT REPLACEMENT   Remove items at home which could result in a fall. This includes throw rugs or furniture in walking pathways ICE to the affected joint every three hours while awake for 30 minutes at a time, for at least the first 3-5 days, and then as needed for pain and swelling.  Continue to use ice for pain and swelling. You may notice swelling that will progress down to the foot and ankle.  This is normal after surgery.  Elevate your leg when you are not up walking on it.   Continue to use the breathing machine you got in the hospital (incentive spirometer) which will help keep your temperature down.  It is common for your temperature to cycle up and down following surgery, especially at night when you are not up moving around  and exerting yourself.  The breathing machine keeps your lungs expanded and your temperature down.   DIET:  As you were doing prior to hospitalization, we recommend a well-balanced diet.  DRESSING / WOUND CARE / SHOWERING  Keep the surgical dressing until follow up.  The dressing is water proof, so you can shower without any extra covering.  IF THE DRESSING FALLS OFF or the wound gets wet inside, change the dressing with sterile gauze.  Please use good hand washing techniques before changing the dressing.  Do not use any lotions or creams on the incision until instructed by your surgeon.    ACTIVITY  Increase activity slowly as tolerated, but follow the weight bearing instructions below.   No driving for 6 weeks or until further direction given by your physician.  You cannot drive while taking narcotics.  No lifting or carrying greater than 10 lbs. until further directed by your surgeon. Avoid periods of inactivity such as sitting longer than an hour when not asleep. This helps prevent blood clots.  You may return to work once you are authorized by your doctor.     WEIGHT BEARING   Weight bearing as tolerated with assist device (walker, cane, etc) as directed, use it as long as suggested by your surgeon or therapist, typically at least 4-6 weeks.   EXERCISES  Results after joint replacement surgery are often greatly improved when you follow the exercise, range of motion and muscle strengthening exercises prescribed by your doctor. Safety measures are also important to protect the joint from further injury. Any time any of these exercises  cause you to have increased pain or swelling, decrease what you are doing until you are comfortable again and then slowly increase them. If you have problems or questions, call your caregiver or physical therapist for advice.   Rehabilitation is important following a joint replacement. After just a few days of immobilization, the muscles of the leg can  become weakened and shrink (atrophy).  These exercises are designed to build up the tone and strength of the thigh and leg muscles and to improve motion. Often times heat used for twenty to thirty minutes before working out will loosen up your tissues and help with improving the range of motion but do not use heat for the first two weeks following surgery (sometimes heat can increase post-operative swelling).   These exercises can be done on a training (exercise) mat, on the floor, on a table or on a bed. Use whatever works the best and is most comfortable for you.    Use music or television while you are exercising so that the exercises are a pleasant break in your day. This will make your life better with the exercises acting as a break in your routine that you can look forward to.   Perform all exercises about fifteen times, three times per day or as directed.  You should exercise both the operative leg and the other leg as well.  Exercises include:   Quad Sets - Tighten up the muscle on the front of the thigh (Quad) and hold for 5-10 seconds.   Straight Leg Raises - With your knee straight (if you were given a brace, keep it on), lift the leg to 60 degrees, hold for 3 seconds, and slowly lower the leg.  Perform this exercise against resistance later as your leg gets stronger.  Leg Slides: Lying on your back, slowly slide your foot toward your buttocks, bending your knee up off the floor (only go as far as is comfortable). Then slowly slide your foot back down until your leg is flat on the floor again.  Angel Wings: Lying on your back spread your legs to the side as far apart as you can without causing discomfort.  Hamstring Strength:  Lying on your back, push your heel against the floor with your leg straight by tightening up the muscles of your buttocks.  Repeat, but this time bend your knee to a comfortable angle, and push your heel against the floor.  You may put a pillow under the heel to make it  more comfortable if necessary.   A rehabilitation program following joint replacement surgery can speed recovery and prevent re-injury in the future due to weakened muscles. Contact your doctor or a physical therapist for more information on knee rehabilitation.    CONSTIPATION  Constipation is defined medically as fewer than three stools per week and severe constipation as less than one stool per week.  Even if you have a regular bowel pattern at home, your normal regimen is likely to be disrupted due to multiple reasons following surgery.  Combination of anesthesia, postoperative narcotics, change in appetite and fluid intake all can affect your bowels.   YOU MUST use at least one of the following options; they are listed in order of increasing strength to get the job done.  They are all available over the counter, and you may need to use some, POSSIBLY even all of these options:    Drink plenty of fluids (prune juice may be helpful) and high fiber foods Colace  100 mg by mouth twice a day  Senokot for constipation as directed and as needed Dulcolax (bisacodyl), take with full glass of water  Miralax (polyethylene glycol) once or twice a day as needed.  If you have tried all these things and are unable to have a bowel movement in the first 3-4 days after surgery call either your surgeon or your primary doctor.    If you experience loose stools or diarrhea, hold the medications until you stool forms back up.  If your symptoms do not get better within 1 week or if they get worse, check with your doctor.  If you experience "the worst abdominal pain ever" or develop nausea or vomiting, please contact the office immediately for further recommendations for treatment.   ITCHING:  If you experience itching with your medications, try taking only a single pain pill, or even half a pain pill at a time.  You can also use Benadryl over the counter for itching or also to help with sleep.   TED HOSE  STOCKINGS:  Use stockings on both legs until for at least 2 weeks or as directed by physician office. They may be removed at night for sleeping.  MEDICATIONS:  See your medication summary on the "After Visit Summary" that nursing will review with you.  You may have some home medications which will be placed on hold until you complete the course of blood thinner medication.  It is important for you to complete the blood thinner medication as prescribed.   Blood clot prevention (DVT Prophylaxis): After surgery you are at an increased risk for a blood clot. you were prescribed a blood thinner, Aspirin '81mg'$ , to be taken twice daily for a total of 4 weeks from surgery to help reduce your risk of getting a blood clot. This will help prevent a blood clot. Signs of a pulmonary embolus (blood clot in the lungs) include sudden short of breath, feeling lightheaded or dizzy, chest pain with a deep breath, rapid pulse rapid breathing. Signs of a blood clot in your arms or legs include new unexplained swelling and cramping, warm, red or darkened skin around the painful area. Please call the office or 911 right away if these signs or symptoms develop.  PRECAUTIONS:  If you experience chest pain or shortness of breath - call 911 immediately for transfer to the hospital emergency department.   If you develop a fever greater that 101 F, purulent drainage from wound, increased redness or drainage from wound, foul odor from the wound/dressing, or calf pain - CONTACT YOUR SURGEON.                                                   FOLLOW-UP APPOINTMENTS:  If you do not already have a post-op appointment, please call the office for an appointment to be seen by your surgeon.  Guidelines for how soon to be seen are listed in your "After Visit Summary", but are typically between 2-3 weeks after surgery.  OTHER INSTRUCTIONS:   Knee Replacement:  Do not place pillow under knee, focus on keeping the knee straight while resting.  DO NOT modify, tear, cut, or change the foam block in any way.  POST-OPERATIVE OPIOID TAPER INSTRUCTIONS: It is important to wean off of your opioid medication as soon as possible. If you do not need pain medication  after your surgery it is ok to stop day one. Opioids include: Codeine, Hydrocodone(Norco, Vicodin), Oxycodone(Percocet, oxycontin) and hydromorphone amongst others.  Long term and even short term use of opiods can cause: Increased pain response Dependence Constipation Depression Respiratory depression And more.  Withdrawal symptoms can include Flu like symptoms Nausea, vomiting And more Techniques to manage these symptoms Hydrate well Eat regular healthy meals Stay active Use relaxation techniques(deep breathing, meditating, yoga) Do Not substitute Alcohol to help with tapering If you have been on opioids for less than two weeks and do not have pain than it is ok to stop all together.  Plan to wean off of opioids This plan should start within one week post op of your joint replacement. Maintain the same interval or time between taking each dose and first decrease the dose.  Cut the total daily intake of opioids by one tablet each day Next start to increase the time between doses. The last dose that should be eliminated is the evening dose.   MAKE SURE YOU:  Understand these instructions.  Get help right away if you are not doing well or get worse.    Thank you for letting us be a part of your medical care team.  It is a privilege we respect greatly.  We hope these instructions will help you stay on track for a fast and full recovery!           Signed: Luciann Gossett A Donne Robillard 08/17/2022, 3:51 PM

## 2022-08-17 NOTE — Plan of Care (Signed)
  Problem: Education: Goal: Knowledge of the prescribed therapeutic regimen will improve Outcome: Progressing   Problem: Pain Management: Goal: Pain level will decrease with appropriate interventions Outcome: Progressing   Problem: Activity: Goal: Risk for activity intolerance will decrease Outcome: Progressing

## 2022-08-17 NOTE — Telephone Encounter (Signed)
Patient hospitalized  [place]  Direct Dial: [phone]

## 2022-08-17 NOTE — Plan of Care (Signed)
  Problem: Education: Goal: Knowledge of the prescribed therapeutic regimen will improve 08/17/2022 1038 by Iver Nestle A, LPN Outcome: Adequate for Discharge 08/17/2022 1029 by Lise Auer, LPN Outcome: Progressing Goal: Individualized Educational Video(s) Outcome: Adequate for Discharge   Problem: Activity: Goal: Ability to avoid complications of mobility impairment will improve Outcome: Adequate for Discharge Goal: Range of joint motion will improve Outcome: Adequate for Discharge   Problem: Clinical Measurements: Goal: Postoperative complications will be avoided or minimized Outcome: Adequate for Discharge   Problem: Pain Management: Goal: Pain level will decrease with appropriate interventions 08/17/2022 1038 by Iver Nestle A, LPN Outcome: Adequate for Discharge 08/17/2022 1029 by Iver Nestle A, LPN Outcome: Progressing   Problem: Skin Integrity: Goal: Will show signs of wound healing Outcome: Adequate for Discharge   Problem: Education: Goal: Knowledge of General Education information will improve Description: Including pain rating scale, medication(s)/side effects and non-pharmacologic comfort measures Outcome: Adequate for Discharge   Problem: Health Behavior/Discharge Planning: Goal: Ability to manage health-related needs will improve Outcome: Adequate for Discharge   Problem: Clinical Measurements: Goal: Ability to maintain clinical measurements within normal limits will improve Outcome: Adequate for Discharge Goal: Will remain free from infection Outcome: Adequate for Discharge Goal: Diagnostic test results will improve Outcome: Adequate for Discharge Goal: Respiratory complications will improve Outcome: Adequate for Discharge Goal: Cardiovascular complication will be avoided Outcome: Adequate for Discharge   Problem: Activity: Goal: Risk for activity intolerance will decrease 08/17/2022 1038 by Iver Nestle A, LPN Outcome: Adequate for  Discharge 08/17/2022 1029 by Iver Nestle A, LPN Outcome: Progressing   Problem: Nutrition: Goal: Adequate nutrition will be maintained Outcome: Adequate for Discharge   Problem: Coping: Goal: Level of anxiety will decrease Outcome: Adequate for Discharge   Problem: Elimination: Goal: Will not experience complications related to bowel motility Outcome: Adequate for Discharge Goal: Will not experience complications related to urinary retention Outcome: Adequate for Discharge   Problem: Pain Managment: Goal: General experience of comfort will improve Outcome: Adequate for Discharge   Problem: Safety: Goal: Ability to remain free from injury will improve Outcome: Adequate for Discharge   Problem: Skin Integrity: Goal: Risk for impaired skin integrity will decrease Outcome: Adequate for Discharge

## 2022-08-17 NOTE — TOC Transition Note (Signed)
Transition of Care [facility]) - CM/SW Discharge Note   Patient Details  Name: Angie [name] MRN: [id number] Date of Birth: 1945-09-13  Transition of Care [facility]) CM/SW Contact:  Lennart Pall, LCSW Phone Number: 08/17/2022, 9:58 AM   Clinical Narrative:    Met with pt and confirming she has needed DME at home.  HHPT prearranged with [facility].  No TOC needs.   Final next level of care: [place] Barriers to Discharge: No Barriers Identified   Patient Goals and CMS Choice      Discharge Placement                         Discharge Plan and Services Additional resources added to the After Visit Summary for                  DME Arranged: N/A DME Agency: NA       HH Arranged: PT HH Agency: [place]        Social Determinants of Health (SDOH) Interventions SDOH Screenings   Food Insecurity: Unknown (08/16/2022)  Housing: Low Risk  (08/16/2022)  Transportation Needs: Unknown (08/16/2022)  Utilities: Unknown (08/16/2022)  Alcohol Screen: Low Risk  (06/08/2022)  Depression (PHQ2-9): Medium Risk (08/11/2022)  Physical Activity: Unknown (06/08/2022)  Tobacco Use: Medium Risk (08/16/2022)     Readmission Risk Interventions     No data to display

## 2022-08-17 NOTE — Progress Notes (Signed)
Physical Therapy Treatment Patient Details Name: Angie [name] MRN: [id number] DOB: 1946-05-23 Today's Date: 08/17/2022   History of Present Illness Pt is a 77yo female presenting s/p L-TKA on 08/16/22. PMH: BPPV, chronic back pain, GERD, HLD, HTN, bilateral RCR, R-THA 2023, Lumbar fusion L4-S1    PT Comments    Pt ambulated good distance in hallway, practiced safe stair technique and performed LE exercises.  Pt provided with HEP handout.  Pt states she plans to start with HHPT upon d/c.  Pt feels ready for d/c home and had no further questions.   Recommendations for follow up therapy are one component of a multi-disciplinary discharge planning process, led by the attending physician.  Recommendations may be updated based on patient status, additional functional criteria and insurance authorization.  Follow Up Recommendations  Follow physician's recommendations for discharge plan and follow up therapies     Assistance Recommended at Discharge Set up Supervision/Assistance  Patient can return home with the following A little help with walking and/or transfers;A little help with bathing/dressing/bathroom;Assistance with cooking/housework;Help with stairs or ramp for entrance;Assist for transportation   Equipment Recommendations  None recommended by PT    Recommendations for Other Services       Precautions / Restrictions Precautions Precautions: Fall;Knee Restrictions Weight Bearing Restrictions: No Other Position/Activity Restrictions: WBAT     Mobility  Bed Mobility               General bed mobility comments: pt in recliner    Transfers Overall transfer level: Needs assistance Equipment used: Rolling walker (2 wheels) Transfers: Sit to/from Stand Sit to Stand: Supervision           General transfer comment: cues for UE and LE positioning    Ambulation/Gait Ambulation/Gait assistance: Min guard, Supervision Gait Distance (Feet): 300 Feet Assistive  device: Rolling walker (2 wheels) Gait Pattern/deviations: Step-to pattern, Antalgic       General Gait Details: verbal cues for RW positioning and step length   Stairs Stairs: Yes Stairs assistance: Min guard, Supervision Stair Management: Forwards, Two rails, Step to pattern Number of Stairs: 3 General stair comments: verbal cues for sequence and safety; performed once with 2 handrails and then once with only left hand rail; significant other present and observed   Wheelchair Mobility    Modified Rankin (Stroke Patients Only)       Balance                                            Cognition Arousal/Alertness: Awake/alert Behavior During Therapy: WFL for tasks assessed/performed Overall Cognitive Status: Within Functional Limits for tasks assessed                                          Exercises Total Joint Exercises Ankle Circles/Pumps: AROM, Both, 10 reps Quad Sets: AROM, Left, 10 reps Heel Slides: AAROM, Left, 10 reps Hip ABduction/ADduction: AAROM, Left, 10 reps Straight Leg Raises: AAROM, Left, 10 reps    General Comments        Pertinent Vitals/Pain Pain Assessment Pain Assessment: 0-10 Pain Score: 3  Pain Location: left knee Pain Descriptors / Indicators: Aching, Sore Pain Intervention(s): Repositioned, Monitored during session    Home Living  Prior Function            PT Goals (current goals can now be found in the care plan section) Progress towards PT goals: Progressing toward goals    Frequency    7X/week      PT Plan Current plan remains appropriate    Co-evaluation              AM-PAC PT "6 Clicks" Mobility   Outcome Measure  Help needed turning from your back to your side while in a flat bed without using bedrails?: A Little Help needed moving from lying on your back to sitting on the side of a flat bed without using bedrails?: A Little Help  needed moving to and from a bed to a chair (including a wheelchair)?: A Little Help needed standing up from a chair using your arms (e.g., wheelchair or bedside chair)?: A Little Help needed to walk in hospital room?: A Little Help needed climbing 3-5 steps with a railing? : A Little 6 Click Score: 18    End of Session Equipment Utilized During Treatment: Gait belt Activity Tolerance: Patient tolerated treatment well;No increased pain Patient left: in chair;with call bell/phone within reach;with chair alarm set Nurse Communication: Mobility status PT Visit Diagnosis: Difficulty in walking, not elsewhere classified (R26.2)     Time: 1011-1030 PT Time Calculation (min) (ACUTE ONLY): 19 min  Charges:  $Gait Training: 8-22 mins                     Jannette Spanner PT, DPT Physical Therapist Acute Rehabilitation Services Preferred contact method: Secure Chat Weekend Pager Only: [phone] Office: [place] 08/17/2022, 11:12 AM

## 2022-08-17 NOTE — Progress Notes (Signed)
     Subjective:  Patient reports pain as mild.  Very comfortable this morning.  No concerns.  She was nauseous yesterday evening after taking oxycodone.  Reports that she commonly gets nauseous with this medication.  Does better with tramadol.  Will switch her to tramadol this morning.  Denies distal numbness and tingling.  Hopeful to go home today.  Objective:   VITALS:   Vitals:   08/16/22 1839 08/16/22 1948 08/17/22 0129 08/17/22 0527  BP: (!) 157/72 139/62 (!) 143/61 121/86  Pulse: 70 70 73 65  Resp: '18 18 18 16  '$ Temp: 98.7 F (37.1 C) 98.4 F (36.9 C) 98.5 F (36.9 C) 98.9 F (37.2 C)  TempSrc:  Oral Oral Oral  SpO2: 99% 98% 96% 98%  Weight:      Height:        Sensation intact distally Intact pulses distally Dorsiflexion/Plantar flexion intact Incision: dressing C/D/I Compartment soft    Lab Results  Component Value Date   WBC 13.0 (H) 08/17/2022   HGB 11.4 (L) 08/17/2022   HCT 34.6 (L) 08/17/2022   MCV 90.8 08/17/2022   PLT 211 08/17/2022   BMET    Component Value Date/Time   NA 138 08/17/2022 0330   NA 142 07/27/2021 1120   K 3.2 (L) 08/17/2022 0330   CL 103 08/17/2022 0330   CO2 28 08/17/2022 0330   GLUCOSE 152 (H) 08/17/2022 0330   BUN 12 08/17/2022 0330   BUN 20 07/27/2021 1120   CREATININE 0.64 08/17/2022 0330   CREATININE 0.72 09/08/2014 1142   CALCIUM 8.7 (L) 08/17/2022 0330   EGFR 86 07/27/2021 1120   GFRNONAA >60 08/17/2022 0330      Xray: X-rays show TKA components in good position no adverse features.  Assessment/Plan: 1 Day Post-Op   Principal Problem:   Localized osteoarthritis of left knee  S/p L TKA 08/16/22  Post op recs: WB: WBAT Abx: ancef Imaging: PACU xrays DVT prophylaxis: Aspirin '81mg'$  BID x4 weeks Follow up: 2 weeks after surgery for a wound check with Dr. Zachery Dakins at [facility].  Address: [address] [place], [place], [place] [address]  Office Phone: [phone]   Charlies Constable,  MD Orthopaedic Surgery    Angie [name] 08/17/2022, 6:48 AM   Charlies Constable, MD  Contact information:   [phone] 7am-5pm epic message Dr. Zachery Dakins, or call office for patient follow up: [phone] After hours and holidays please check [email or web address] for group call information for [facility]

## 2022-08-18 DIAGNOSIS — G4700 Insomnia, unspecified: Secondary | ICD-10-CM | POA: Diagnosis not present

## 2022-08-18 DIAGNOSIS — Z7982 Long term (current) use of aspirin: Secondary | ICD-10-CM | POA: Diagnosis not present

## 2022-08-18 DIAGNOSIS — R7303 Prediabetes: Secondary | ICD-10-CM | POA: Diagnosis not present

## 2022-08-18 DIAGNOSIS — G8929 Other chronic pain: Secondary | ICD-10-CM | POA: Diagnosis not present

## 2022-08-18 DIAGNOSIS — Z791 Long term (current) use of non-steroidal anti-inflammatories (NSAID): Secondary | ICD-10-CM | POA: Diagnosis not present

## 2022-08-18 DIAGNOSIS — Z9851 Tubal ligation status: Secondary | ICD-10-CM | POA: Diagnosis not present

## 2022-08-18 DIAGNOSIS — Z96641 Presence of right artificial hip joint: Secondary | ICD-10-CM | POA: Diagnosis not present

## 2022-08-18 DIAGNOSIS — K219 Gastro-esophageal reflux disease without esophagitis: Secondary | ICD-10-CM | POA: Diagnosis not present

## 2022-08-18 DIAGNOSIS — Z87891 Personal history of nicotine dependence: Secondary | ICD-10-CM | POA: Diagnosis not present

## 2022-08-18 DIAGNOSIS — G43909 Migraine, unspecified, not intractable, without status migrainosus: Secondary | ICD-10-CM | POA: Diagnosis not present

## 2022-08-18 DIAGNOSIS — Z9181 History of falling: Secondary | ICD-10-CM | POA: Diagnosis not present

## 2022-08-18 DIAGNOSIS — Z981 Arthrodesis status: Secondary | ICD-10-CM | POA: Diagnosis not present

## 2022-08-18 DIAGNOSIS — J309 Allergic rhinitis, unspecified: Secondary | ICD-10-CM | POA: Diagnosis not present

## 2022-08-18 DIAGNOSIS — I10 Essential (primary) hypertension: Secondary | ICD-10-CM | POA: Diagnosis not present

## 2022-08-18 DIAGNOSIS — Z471 Aftercare following joint replacement surgery: Secondary | ICD-10-CM | POA: Diagnosis not present

## 2022-08-18 DIAGNOSIS — E785 Hyperlipidemia, unspecified: Secondary | ICD-10-CM | POA: Diagnosis not present

## 2022-08-18 DIAGNOSIS — K5730 Diverticulosis of large intestine without perforation or abscess without bleeding: Secondary | ICD-10-CM | POA: Diagnosis not present

## 2022-08-18 DIAGNOSIS — Z96652 Presence of left artificial knee joint: Secondary | ICD-10-CM | POA: Diagnosis not present

## 2022-08-18 DIAGNOSIS — Z8701 Personal history of pneumonia (recurrent): Secondary | ICD-10-CM | POA: Diagnosis not present

## 2022-08-18 DIAGNOSIS — Z9049 Acquired absence of other specified parts of digestive tract: Secondary | ICD-10-CM | POA: Diagnosis not present

## 2022-08-20 DIAGNOSIS — G8929 Other chronic pain: Secondary | ICD-10-CM | POA: Diagnosis not present

## 2022-08-20 DIAGNOSIS — I10 Essential (primary) hypertension: Secondary | ICD-10-CM | POA: Diagnosis not present

## 2022-08-20 DIAGNOSIS — E785 Hyperlipidemia, unspecified: Secondary | ICD-10-CM | POA: Diagnosis not present

## 2022-08-20 DIAGNOSIS — K5730 Diverticulosis of large intestine without perforation or abscess without bleeding: Secondary | ICD-10-CM | POA: Diagnosis not present

## 2022-08-20 DIAGNOSIS — Z471 Aftercare following joint replacement surgery: Secondary | ICD-10-CM | POA: Diagnosis not present

## 2022-08-20 DIAGNOSIS — K219 Gastro-esophageal reflux disease without esophagitis: Secondary | ICD-10-CM | POA: Diagnosis not present

## 2022-08-21 DIAGNOSIS — I10 Essential (primary) hypertension: Secondary | ICD-10-CM | POA: Diagnosis not present

## 2022-08-21 DIAGNOSIS — K219 Gastro-esophageal reflux disease without esophagitis: Secondary | ICD-10-CM | POA: Diagnosis not present

## 2022-08-21 DIAGNOSIS — E785 Hyperlipidemia, unspecified: Secondary | ICD-10-CM | POA: Diagnosis not present

## 2022-08-21 DIAGNOSIS — K5730 Diverticulosis of large intestine without perforation or abscess without bleeding: Secondary | ICD-10-CM | POA: Diagnosis not present

## 2022-08-21 DIAGNOSIS — G8929 Other chronic pain: Secondary | ICD-10-CM | POA: Diagnosis not present

## 2022-08-21 DIAGNOSIS — Z471 Aftercare following joint replacement surgery: Secondary | ICD-10-CM | POA: Diagnosis not present

## 2022-08-23 DIAGNOSIS — I10 Essential (primary) hypertension: Secondary | ICD-10-CM | POA: Diagnosis not present

## 2022-08-23 DIAGNOSIS — Z471 Aftercare following joint replacement surgery: Secondary | ICD-10-CM | POA: Diagnosis not present

## 2022-08-23 DIAGNOSIS — K219 Gastro-esophageal reflux disease without esophagitis: Secondary | ICD-10-CM | POA: Diagnosis not present

## 2022-08-23 DIAGNOSIS — G8929 Other chronic pain: Secondary | ICD-10-CM | POA: Diagnosis not present

## 2022-08-23 DIAGNOSIS — K5730 Diverticulosis of large intestine without perforation or abscess without bleeding: Secondary | ICD-10-CM | POA: Diagnosis not present

## 2022-08-23 DIAGNOSIS — E785 Hyperlipidemia, unspecified: Secondary | ICD-10-CM | POA: Diagnosis not present

## 2022-08-23 NOTE — Telephone Encounter (Signed)
Rescheduled 08/29/22  [place]  Direct Dial: [phone]

## 2022-08-25 DIAGNOSIS — I10 Essential (primary) hypertension: Secondary | ICD-10-CM | POA: Diagnosis not present

## 2022-08-25 DIAGNOSIS — G8929 Other chronic pain: Secondary | ICD-10-CM | POA: Diagnosis not present

## 2022-08-25 DIAGNOSIS — E785 Hyperlipidemia, unspecified: Secondary | ICD-10-CM | POA: Diagnosis not present

## 2022-08-25 DIAGNOSIS — K219 Gastro-esophageal reflux disease without esophagitis: Secondary | ICD-10-CM | POA: Diagnosis not present

## 2022-08-25 DIAGNOSIS — K5730 Diverticulosis of large intestine without perforation or abscess without bleeding: Secondary | ICD-10-CM | POA: Diagnosis not present

## 2022-08-25 DIAGNOSIS — Z471 Aftercare following joint replacement surgery: Secondary | ICD-10-CM | POA: Diagnosis not present

## 2022-08-28 DIAGNOSIS — Z471 Aftercare following joint replacement surgery: Secondary | ICD-10-CM | POA: Diagnosis not present

## 2022-08-28 DIAGNOSIS — E785 Hyperlipidemia, unspecified: Secondary | ICD-10-CM | POA: Diagnosis not present

## 2022-08-28 DIAGNOSIS — I10 Essential (primary) hypertension: Secondary | ICD-10-CM | POA: Diagnosis not present

## 2022-08-28 DIAGNOSIS — G8929 Other chronic pain: Secondary | ICD-10-CM | POA: Diagnosis not present

## 2022-08-28 DIAGNOSIS — K219 Gastro-esophageal reflux disease without esophagitis: Secondary | ICD-10-CM | POA: Diagnosis not present

## 2022-08-28 DIAGNOSIS — K5730 Diverticulosis of large intestine without perforation or abscess without bleeding: Secondary | ICD-10-CM | POA: Diagnosis not present

## 2022-08-29 ENCOUNTER — Ambulatory Visit: Payer: Self-pay

## 2022-08-29 NOTE — Patient Outreach (Signed)
  Care Coordination   Follow Up Visit Note   08/29/2022 Name: Angie [name] MRN: [id number] DOB: 07-04-1946  Angie [name] is a 77 y.o. year old female who sees [name], MD for primary care. I spoke with  Angie [name] by phone today.  What matters to the patients health and wellness today?   The patient continues to maintain positive progress with care plan goals. I had a conversation with [name], and she informed me that she underwent knee replacement surgery on her left knee. She is doing well and has successfully completed the Home Health Physical Therapy. Currently, she is experiencing little to no pain and diligently performing the prescribed exercises and stretches with the assistance of her husband. Furthermore, she is also using ice and compression stockings to aid in her recovery. See below for interventions and patient self-care actives.        I am trying to strengthen my left knee        Patient Goals/Self Care Activities: -Patient/Caregiver will self-administer medications as prescribed as evidenced by self-report/primary caregiver report  -Patient/Caregiver will attend all scheduled provider appointments as evidenced by clinician review of documented attendance to scheduled appointments and patient/caregiver report -Patient/Caregiver will call pharmacy for medication refills as evidenced by patient report and review of pharmacy fill history as appropriate -Patient/Caregiver will call provider office for new concerns or questions as evidenced by review of documented incoming telephone call notes and patient report -Patient/Caregiver verbalizes understanding of plan  -Patient will continue with stretches ands exercises and prescribed -Patient will use Ice -Patient will wear compression stockings         SDOH assessments and interventions completed:  No     Care Coordination Interventions:  Yes, provided   Interventions Today     Flowsheet Row Most Recent Value  Chronic Disease Discussed/Reviewed   Chronic disease discussed/reviewed during today's visit Other  [Left Knee Replacement]  General Interventions   General Interventions Discussed/Reviewed General Interventions Discussed, General Interventions Reviewed  Exercise Interventions   Exercise Discussed/Reviewed Exercise Discussed  Nutrition Interventions   Nutrition Discussed/Reviewed Nutrition Discussed  Pharmacy Interventions   Pharmacy Dicussed/Reviewed Pharmacy Topics Discussed  Safety Interventions   Safety Discussed/Reviewed Safety Discussed        Follow up plan: Follow up call scheduled for 11/30/22 11 am    Encounter Outcome:  Pt. Visit Completed   Lazaro Arms RN, BSN, [facility]   Phone: [phone]

## 2022-08-29 NOTE — Patient Instructions (Signed)
Visit Information  Thank you for taking time to visit with me today. Please don't hesitate to contact me if I can be of assistance to you.   Following are the goals we discussed today:   Goals Addressed               This Visit's Progress     COMPLETED: "I am trying to stay healthy" (pt-stated)         Resolving due to duplicate goal   Timeframe:  Long-Range Goal Priority:  High Start Date:    06/09/2019                         Expected End Date:    Patient Goals/Self Care Activities: Patient verbalizes understanding of plan Self-administers medications as prescribed Calls pharmacy for medication refills Call's provider office for new concerns or questions begin a weight loss diary get rid of junk food identify pros and cons of weight loss identify what might get in the way of success stock up on healthy food choices track why I am eating set weight loss goal/      I am trying to strengthen my left knee        Patient Goals/Self Care Activities: -Patient/Caregiver will self-administer medications as prescribed as evidenced by self-report/primary caregiver report  -Patient/Caregiver will attend all scheduled provider appointments as evidenced by clinician review of documented attendance to scheduled appointments and patient/caregiver report -Patient/Caregiver will call pharmacy for medication refills as evidenced by patient report and review of pharmacy fill history as appropriate -Patient/Caregiver will call provider office for new concerns or questions as evidenced by review of documented incoming telephone call notes and patient report -Patient/Caregiver verbalizes understanding of plan  -Patient will continue with stretches ands exercises and prescribed -Patient will use Ice -Patient will wear compression stockings         Our next appointment is by telephone on 11/30/22 at 11 am  Please call the care guide team at [phone] if you need to cancel or reschedule  your appointment.   If you are experiencing a Mental Health or [place] or need someone to talk to, please call 1-800-273-TALK (toll free, 24 hour hotline)  Patient verbalizes understanding of instructions and care plan provided today and agrees to view in [place]. Active MyChart status and patient understanding of how to access instructions and care plan via MyChart confirmed with patient.     Lazaro Arms RN, BSN, [facility]   Phone: [phone]

## 2022-08-30 DIAGNOSIS — K219 Gastro-esophageal reflux disease without esophagitis: Secondary | ICD-10-CM | POA: Diagnosis not present

## 2022-08-30 DIAGNOSIS — I10 Essential (primary) hypertension: Secondary | ICD-10-CM | POA: Diagnosis not present

## 2022-08-30 DIAGNOSIS — Z471 Aftercare following joint replacement surgery: Secondary | ICD-10-CM | POA: Diagnosis not present

## 2022-08-30 DIAGNOSIS — E785 Hyperlipidemia, unspecified: Secondary | ICD-10-CM | POA: Diagnosis not present

## 2022-08-30 DIAGNOSIS — G8929 Other chronic pain: Secondary | ICD-10-CM | POA: Diagnosis not present

## 2022-08-30 DIAGNOSIS — K5730 Diverticulosis of large intestine without perforation or abscess without bleeding: Secondary | ICD-10-CM | POA: Diagnosis not present

## 2022-08-31 DIAGNOSIS — M1712 Unilateral primary osteoarthritis, left knee: Secondary | ICD-10-CM | POA: Diagnosis not present

## 2022-09-01 DIAGNOSIS — M1712 Unilateral primary osteoarthritis, left knee: Secondary | ICD-10-CM | POA: Diagnosis not present

## 2022-09-01 DIAGNOSIS — R262 Difficulty in walking, not elsewhere classified: Secondary | ICD-10-CM | POA: Diagnosis not present

## 2022-09-01 DIAGNOSIS — M25662 Stiffness of left knee, not elsewhere classified: Secondary | ICD-10-CM | POA: Diagnosis not present

## 2022-09-01 DIAGNOSIS — M6281 Muscle weakness (generalized): Secondary | ICD-10-CM | POA: Diagnosis not present

## 2022-09-11 DIAGNOSIS — R262 Difficulty in walking, not elsewhere classified: Secondary | ICD-10-CM | POA: Diagnosis not present

## 2022-09-11 DIAGNOSIS — M1712 Unilateral primary osteoarthritis, left knee: Secondary | ICD-10-CM | POA: Diagnosis not present

## 2022-09-11 DIAGNOSIS — M25662 Stiffness of left knee, not elsewhere classified: Secondary | ICD-10-CM | POA: Diagnosis not present

## 2022-09-11 DIAGNOSIS — M6281 Muscle weakness (generalized): Secondary | ICD-10-CM | POA: Diagnosis not present

## 2022-09-12 ENCOUNTER — Other Ambulatory Visit: Payer: Self-pay

## 2022-09-12 MED ORDER — FAMOTIDINE 40 MG PO TABS: 40.0000 mg | ORAL_TABLET | Freq: Every day | ORAL | 1 refills | Status: DC | PRN

## 2022-09-12 MED ORDER — LEVOCETIRIZINE DIHYDROCHLORIDE 5 MG PO TABS: 5.0000 mg | ORAL_TABLET | Freq: Every evening | ORAL | 1 refills | Status: DC

## 2022-09-14 DIAGNOSIS — M1712 Unilateral primary osteoarthritis, left knee: Secondary | ICD-10-CM | POA: Diagnosis not present

## 2022-09-14 DIAGNOSIS — M25662 Stiffness of left knee, not elsewhere classified: Secondary | ICD-10-CM | POA: Diagnosis not present

## 2022-09-14 DIAGNOSIS — M6281 Muscle weakness (generalized): Secondary | ICD-10-CM | POA: Diagnosis not present

## 2022-09-14 DIAGNOSIS — R262 Difficulty in walking, not elsewhere classified: Secondary | ICD-10-CM | POA: Diagnosis not present

## 2022-09-18 DIAGNOSIS — M1712 Unilateral primary osteoarthritis, left knee: Secondary | ICD-10-CM | POA: Diagnosis not present

## 2022-09-18 DIAGNOSIS — R262 Difficulty in walking, not elsewhere classified: Secondary | ICD-10-CM | POA: Diagnosis not present

## 2022-09-18 DIAGNOSIS — M6281 Muscle weakness (generalized): Secondary | ICD-10-CM | POA: Diagnosis not present

## 2022-09-18 DIAGNOSIS — M25662 Stiffness of left knee, not elsewhere classified: Secondary | ICD-10-CM | POA: Diagnosis not present

## 2022-09-21 DIAGNOSIS — M1712 Unilateral primary osteoarthritis, left knee: Secondary | ICD-10-CM | POA: Diagnosis not present

## 2022-09-21 DIAGNOSIS — M25662 Stiffness of left knee, not elsewhere classified: Secondary | ICD-10-CM | POA: Diagnosis not present

## 2022-09-21 DIAGNOSIS — M6281 Muscle weakness (generalized): Secondary | ICD-10-CM | POA: Diagnosis not present

## 2022-09-21 DIAGNOSIS — R262 Difficulty in walking, not elsewhere classified: Secondary | ICD-10-CM | POA: Diagnosis not present

## 2022-09-25 DIAGNOSIS — M25662 Stiffness of left knee, not elsewhere classified: Secondary | ICD-10-CM | POA: Diagnosis not present

## 2022-09-25 DIAGNOSIS — R262 Difficulty in walking, not elsewhere classified: Secondary | ICD-10-CM | POA: Diagnosis not present

## 2022-09-25 DIAGNOSIS — M6281 Muscle weakness (generalized): Secondary | ICD-10-CM | POA: Diagnosis not present

## 2022-09-25 DIAGNOSIS — M1712 Unilateral primary osteoarthritis, left knee: Secondary | ICD-10-CM | POA: Diagnosis not present

## 2022-09-28 DIAGNOSIS — R262 Difficulty in walking, not elsewhere classified: Secondary | ICD-10-CM | POA: Diagnosis not present

## 2022-09-28 DIAGNOSIS — M1712 Unilateral primary osteoarthritis, left knee: Secondary | ICD-10-CM | POA: Diagnosis not present

## 2022-09-28 DIAGNOSIS — M6281 Muscle weakness (generalized): Secondary | ICD-10-CM | POA: Diagnosis not present

## 2022-09-28 DIAGNOSIS — M25662 Stiffness of left knee, not elsewhere classified: Secondary | ICD-10-CM | POA: Diagnosis not present

## 2022-10-02 DIAGNOSIS — R262 Difficulty in walking, not elsewhere classified: Secondary | ICD-10-CM | POA: Diagnosis not present

## 2022-10-02 DIAGNOSIS — M25662 Stiffness of left knee, not elsewhere classified: Secondary | ICD-10-CM | POA: Diagnosis not present

## 2022-10-02 DIAGNOSIS — M6281 Muscle weakness (generalized): Secondary | ICD-10-CM | POA: Diagnosis not present

## 2022-10-02 DIAGNOSIS — M1712 Unilateral primary osteoarthritis, left knee: Secondary | ICD-10-CM | POA: Diagnosis not present

## 2022-10-06 DIAGNOSIS — M1712 Unilateral primary osteoarthritis, left knee: Secondary | ICD-10-CM | POA: Diagnosis not present

## 2022-10-06 DIAGNOSIS — M25662 Stiffness of left knee, not elsewhere classified: Secondary | ICD-10-CM | POA: Diagnosis not present

## 2022-10-06 DIAGNOSIS — R262 Difficulty in walking, not elsewhere classified: Secondary | ICD-10-CM | POA: Diagnosis not present

## 2022-10-06 DIAGNOSIS — M6281 Muscle weakness (generalized): Secondary | ICD-10-CM | POA: Diagnosis not present

## 2022-10-10 DIAGNOSIS — H35363 Drusen (degenerative) of macula, bilateral: Secondary | ICD-10-CM | POA: Diagnosis not present

## 2022-10-10 DIAGNOSIS — H2513 Age-related nuclear cataract, bilateral: Secondary | ICD-10-CM | POA: Diagnosis not present

## 2022-10-10 DIAGNOSIS — H04123 Dry eye syndrome of bilateral lacrimal glands: Secondary | ICD-10-CM | POA: Diagnosis not present

## 2022-11-09 DIAGNOSIS — M1712 Unilateral primary osteoarthritis, left knee: Secondary | ICD-10-CM | POA: Diagnosis not present

## 2022-11-13 DIAGNOSIS — K219 Gastro-esophageal reflux disease without esophagitis: Secondary | ICD-10-CM | POA: Diagnosis not present

## 2022-11-24 ENCOUNTER — Ambulatory Visit (INDEPENDENT_AMBULATORY_CARE_PROVIDER_SITE_OTHER): Payer: Medicare Other | Admitting: Family Medicine

## 2022-11-24 ENCOUNTER — Encounter: Payer: Self-pay | Admitting: Family Medicine

## 2022-11-24 ENCOUNTER — Other Ambulatory Visit (HOSPITAL_COMMUNITY): Payer: Self-pay

## 2022-11-24 VITALS — BP 99/82 | HR 85 | Ht 63.0 in | Wt 215.6 lb

## 2022-11-24 DIAGNOSIS — D649 Anemia, unspecified: Secondary | ICD-10-CM

## 2022-11-24 DIAGNOSIS — I10 Essential (primary) hypertension: Secondary | ICD-10-CM | POA: Diagnosis not present

## 2022-11-24 DIAGNOSIS — R5383 Other fatigue: Secondary | ICD-10-CM

## 2022-11-24 DIAGNOSIS — R7303 Prediabetes: Secondary | ICD-10-CM

## 2022-11-24 DIAGNOSIS — E785 Hyperlipidemia, unspecified: Secondary | ICD-10-CM

## 2022-11-24 LAB — POCT GLYCOSYLATED HEMOGLOBIN (HGB A1C): HbA1c, POC (prediabetic range): 6.1 % (ref 5.7–6.4)

## 2022-11-24 MED ORDER — SEMAGLUTIDE(0.25 OR 0.5MG/DOS) 2 MG/1.5ML ~~LOC~~ SOPN: 0.2500 mg | PEN_INJECTOR | SUBCUTANEOUS | 1 refills | Status: DC

## 2022-11-24 NOTE — Assessment & Plan Note (Signed)
Blood pressure is actually low today. She is asymptomatic. Plan to monitor BP closely at home for two weeks. Plan to titrate her HCTZ as needed based on her home readings and f/u BP readings. She will return soon if BP remains low or she is symptomatic.

## 2022-11-24 NOTE — Assessment & Plan Note (Signed)
FLP checked. 

## 2022-11-24 NOTE — Assessment & Plan Note (Signed)
Likely related to stress. Graduated exercise discussed. Checked labs - Anemia panel, Cmet. I will contact her soon with her results.

## 2022-11-24 NOTE — Assessment & Plan Note (Signed)
A1C increased to 6.1 Still prediabetic range. She wishes to prevent progression to DM given strong family hx. She is interested in initiating GLP1 Given her preDM and metabolic syndrome (obesity), we will trial Ozempic. I will forward information to the pharmacy tech to help with this.

## 2022-11-24 NOTE — Assessment & Plan Note (Signed)
I encouraged lifestyle modification. Will initiate GLP1 if covered by her insurance. Monitor closely for now.

## 2022-11-24 NOTE — Progress Notes (Signed)
    SUBJECTIVE:   CHIEF COMPLAINT / HPI:   PreDM: Currently on lifestyle modification. Here for follow up.   Fatigue/Weight management: The patient endorsed being stressed at home with her 42-month-old great-grandchild, who is living with them. She tries to get to the [facility] for exercise if her husband can take her. She also does TV exercise programs as she is able to. No weight loss - trying to lose weight.   HTN/HLD: Compliant with meds. She is on HCTZ 25 mg QD for HTN and Pravachol 40 mg QD for HLD.  PERTINENT  PMH / PSH: PMHx reviewed  OBJECTIVE:   BP 99/82   Pulse 85   Ht 5\' 3"  (1.6 m)   Wt 215 lb 9.6 oz (97.8 kg)   SpO2 97%   BMI 38.19 kg/m   Physical Exam Vitals reviewed.  Cardiovascular:     Rate and Rhythm: Normal rate and regular rhythm.     Heart sounds: Murmur heard.     Comments: Soft systolic murmur - 1-2/6 Pulmonary:     Effort: No respiratory distress.     Breath sounds: Normal breath sounds. No wheezing.  Abdominal:     General: Abdomen is flat. Bowel sounds are normal. There is no distension.     Tenderness: There is no abdominal tenderness.  Musculoskeletal:     Comments: ++ left LL swelling and + right LL swelling. Right knee incision scar looks good.  Psychiatric:        Mood and Affect: Mood normal.      ASSESSMENT/PLAN:   Pre-diabetes A1C increased to 6.1 Still prediabetic range. She wishes to prevent progression to DM given strong family hx. She is interested in initiating GLP1 Given her preDM and metabolic syndrome (obesity), we will trial Ozempic. I will forward information to the pharmacy tech to help with this.  Fatigue Likely related to stress. Graduated exercise discussed. Checked labs - Anemia panel, Cmet. I will contact her soon with her results.   Morbid obesity (HCC) I encouraged lifestyle modification. Will initiate GLP1 if covered by her insurance. Monitor closely for now.  HYPERTENSION, BENIGN SYSTEMIC Blood  pressure is actually low today. She is asymptomatic. Plan to monitor BP closely at home for two weeks. Plan to titrate her HCTZ as needed based on her home readings and f/u BP readings. She will return soon if BP remains low or she is symptomatic.   Janit Pagan, MD [facility]

## 2022-11-24 NOTE — Patient Instructions (Signed)
Obesity, Adult ?Obesity is having too much body fat. Being obese means that your weight is more than what is healthy for you.  ?BMI (body mass index) is a number that explains how much body fat you have. If you have a BMI of 30 or more, you are obese. ?Obesity can cause serious health problems, such as: ?Stroke. ?Coronary artery disease (CAD). ?Type 2 diabetes. ?Some types of cancer. ?High blood pressure (hypertension). ?High cholesterol. ?Gallbladder stones. ?Obesity can also contribute to: ?Osteoarthritis. ?Sleep apnea. ?Infertility problems. ?What are the causes? ?Eating meals each day that are high in calories, sugar, and fat. ?Drinking a lot of drinks that have sugar in them. ?Being born with genes that may make you more likely to become obese. ?Having a medical condition that causes obesity. ?Taking certain medicines. ?Sitting a lot (having a sedentary lifestyle). ?Not getting enough sleep. ?What increases the risk? ?Having a family history of obesity. ?Living in an area with limited access to: ?Parks, recreation centers, or sidewalks. ?Healthy food choices, such as grocery stores and farmers' markets. ?What are the signs or symptoms? ?The main sign is having too much body fat. ?How is this treated? ?Treatment for this condition often includes changing your lifestyle. Treatment may include: ?Changing your diet. This may include making a healthy meal plan. ?Exercise. This may include activity that causes your heart to beat faster (aerobic exercise) and strength training. Work with your doctor to design a program that works for you. ?Medicine to help you lose weight. This may be used if you are not able to lose one pound a week after 6 weeks of healthy eating and more exercise. ?Treating conditions that cause the obesity. ?Surgery. Options may include gastric banding and gastric bypass. This may be done if: ?Other treatments have not helped to improve your condition. ?You have a BMI of 40 or higher. ?You have  life-threatening health problems related to obesity. ?Follow these instructions at home: ?Eating and drinking ? ?Follow advice from your doctor about what to eat and drink. Your doctor may tell you to: ?Limit fast food, sweets, and processed snack foods. ?Choose low-fat options. For example, choose low-fat milk instead of whole milk. ?Eat five or more servings of fruits or vegetables each day. ?Eat at home more often. This gives you more control over what you eat. ?Choose healthy foods when you eat out. ?Learn to read food labels. This will help you learn how much food is in one serving. ?Keep low-fat snacks available. ?Avoid drinks that have a lot of sugar in them. These include soda, fruit juice, iced tea with sugar, and flavored milk. ?Drink enough water to keep your pee (urine) pale yellow. ?Do not go on fad diets. ?Physical activity ?Exercise often, as told by your doctor. Most adults should get up to 150 minutes of moderate-intensity exercise every week.Ask your doctor: ?What types of exercise are safe for you. ?How often you should exercise. ?Warm up and stretch before being active. ?Do slow stretching after being active (cool down). ?Rest between times of being active. ?Lifestyle ?Work with your doctor and a food expert (dietitian) to set a weight-loss goal that is best for you. ?Limit your screen time. ?Find ways to reward yourself that do not involve food. ?Do not drink alcohol if: ?Your doctor tells you not to drink. ?You are pregnant, may be pregnant, or are planning to become pregnant. ?If you drink alcohol: ?Limit how much you have to: ?0-1 drink a day for women. ?0-2 drinks   a day for men. ?Know how much alcohol is in your drink. In the [place] one drink equals one 12 oz bottle of beer (355 mL), one 5 oz glass of wine (148 mL), or one 1? oz glass of hard liquor (44 mL). ?General instructions ?Keep a weight-loss journal. This can help you keep track of: ?The food that you eat. ?How much exercise you  get. ?Take over-the-counter and prescription medicines only as told by your doctor. ?Take vitamins and supplements only as told by your doctor. ?Think about joining a support group. ?Pay attention to your mental health as obesity can lead to depression or self esteem issues. ?Keep all follow-up visits. ?Contact a doctor if: ?You cannot meet your weight-loss goal after you have changed your diet and lifestyle for 6 weeks. ?You are having trouble breathing. ?Summary ?Obesity is having too much body fat. ?Being obese means that your weight is more than what is healthy for you. ?Work with your doctor to set a weight-loss goal. ?Get regular exercise as told by your doctor. ?This information is not intended to replace advice given to you by your health care provider. Make sure you discuss any questions you have with your health care provider. ?Document Revised: 02/15/2021 Document Reviewed: 02/15/2021 ?Elsevier Patient Education ? 2023 Elsevier Inc. ? ?

## 2022-11-25 LAB — ANEMIA PROFILE B
Basophils Absolute: 0 10*3/uL (ref 0.0–0.2)
Basos: 0 %
EOS (ABSOLUTE): 0.1 10*3/uL (ref 0.0–0.4)
Eos: 2 %
Ferritin: 76 ng/mL (ref 15–150)
Folate: 9.2 ng/mL (ref >3.0)
Hematocrit: 40.1 % (ref 34.0–46.6)
Hemoglobin: 13 g/dL (ref 11.1–15.9)
Immature Grans (Abs): 0 10*3/uL (ref 0.0–0.1)
Immature Granulocytes: 0 %
Iron Saturation: 20 % (ref 15–55)
Iron: 56 ug/dL (ref 27–139)
Lymphocytes Absolute: 1.8 10*3/uL (ref 0.7–3.1)
Lymphs: 32 %
MCH: 29.5 pg (ref 26.6–33.0)
MCHC: 32.4 g/dL (ref 31.5–35.7)
MCV: 91 fL (ref 79–97)
Monocytes Absolute: 0.4 10*3/uL (ref 0.1–0.9)
Monocytes: 6 %
Neutrophils Absolute: 3.4 10*3/uL (ref 1.4–7.0)
Neutrophils: 60 %
Platelets: 197 10*3/uL (ref 150–450)
RBC: 4.41 x10E6/uL (ref 3.77–5.28)
RDW: 13.6 % (ref 11.7–15.4)
Retic Ct Pct: 1.4 % (ref 0.6–2.6)
Total Iron Binding Capacity: 287 ug/dL (ref 250–450)
UIBC: 231 ug/dL (ref 118–369)
Vitamin B-12: 766 pg/mL (ref 232–1245)
WBC: 5.7 10*3/uL (ref 3.4–10.8)

## 2022-11-25 LAB — CMP14+EGFR
ALT: 15 IU/L (ref 0–32)
AST: 21 IU/L (ref 0–40)
Albumin/Globulin Ratio: 2.1 (ref 1.2–2.2)
Albumin: 4.4 g/dL (ref 3.8–4.8)
Alkaline Phosphatase: 84 IU/L (ref 44–121)
BUN/Creatinine Ratio: 21 (ref 12–28)
BUN: 15 mg/dL (ref 8–27)
Bilirubin Total: 0.5 mg/dL (ref 0.0–1.2)
CO2: 25 mmol/L (ref 20–29)
Calcium: 10.3 mg/dL (ref 8.7–10.3)
Chloride: 103 mmol/L (ref 96–106)
Creatinine, Ser: 0.7 mg/dL (ref 0.57–1.00)
Globulin, Total: 2.1 g/dL (ref 1.5–4.5)
Glucose: 104 mg/dL — ABNORMAL HIGH (ref 70–99)
Potassium: 3.8 mmol/L (ref 3.5–5.2)
Sodium: 143 mmol/L (ref 134–144)
Total Protein: 6.5 g/dL (ref 6.0–8.5)
eGFR: 90 mL/min/{1.73_m2} (ref >59)

## 2022-11-25 LAB — LIPID PANEL
Chol/HDL Ratio: 2.9 ratio (ref 0.0–4.4)
Cholesterol, Total: 189 mg/dL (ref 100–199)
HDL: 66 mg/dL (ref >39)
LDL Chol Calc (NIH): 104 mg/dL — ABNORMAL HIGH (ref 0–99)
Triglycerides: 108 mg/dL (ref 0–149)
VLDL Cholesterol Cal: 19 mg/dL (ref 5–40)

## 2022-11-27 ENCOUNTER — Other Ambulatory Visit (HOSPITAL_COMMUNITY): Payer: Self-pay

## 2022-11-27 ENCOUNTER — Telehealth: Payer: Self-pay | Admitting: Family Medicine

## 2022-11-27 ENCOUNTER — Telehealth: Payer: Self-pay

## 2022-11-27 NOTE — Telephone Encounter (Signed)
Patient returns call to nurse line.   Results discussed with patient and the need to transition to Crestor. She is amenable to this.   She is also requesting refills on all medications En[facility] sends in for her to [facility].   Will forward to PCP.

## 2022-11-27 NOTE — Telephone Encounter (Signed)
-----   Message from Doreene Eland, MD sent at 11/24/2022 12:27 PM EDT ----- Angie [name],  Please help look up coverage for Ozempic for this patient with Prediabetes and metabolic syndrome. Thanks.  [place]

## 2022-11-27 NOTE — Telephone Encounter (Signed)
Submitted application for OZEMPIC to [employer] for patient assistance via online portal.   Phone: [phone]  

## 2022-11-27 NOTE — Telephone Encounter (Signed)
Patient has no prescription coverage. Will attempt Novo Nordisk PAP enrollment online for [employer]. If unsuccessful, will mail application to patients home.

## 2022-11-27 NOTE — Telephone Encounter (Signed)
HIPAA compliant callback message left.  I will also send her a MyChart message.  Labs looks good for the most part. Her LDL remains above goal on Pravachol. Plan to transition to Crestor with more efficacy. Start Crestor 10 mg QD. Repeat test in 6 to 12 months.

## 2022-11-29 ENCOUNTER — Other Ambulatory Visit: Payer: Self-pay | Admitting: Family Medicine

## 2022-11-29 MED ORDER — MOMETASONE FUROATE 50 MCG/ACT NA SUSP: 2.0000 | Freq: Every day | NASAL | 1 refills | Status: AC

## 2022-11-29 MED ORDER — ROSUVASTATIN CALCIUM 10 MG PO TABS: 10.0000 mg | ORAL_TABLET | Freq: Every day | ORAL | 1 refills | Status: DC

## 2022-11-29 MED ORDER — SUMATRIPTAN SUCCINATE 25 MG PO TABS: ORAL_TABLET | ORAL | 2 refills | Status: DC

## 2022-11-29 MED ORDER — HYDROCHLOROTHIAZIDE 25 MG PO TABS: 25.0000 mg | ORAL_TABLET | Freq: Every day | ORAL | 1 refills | Status: DC

## 2022-11-29 NOTE — Telephone Encounter (Signed)
Per the record, it seems she has not received HCTZ refills since June 2023, with 90 days of supply and one refill. I called to confirm this. She stated that she does have her med and takes it everyday. Her BP is usually in the 130s/80s at home.  I gave her BP parameters - 110/70 to 140/90. She was instructed to hold her antihypertensive agent if her BP is lower than her goal. She agreed with the plan. She also stated that her ENT started her on Prilosec 40 mg BID for nighttime coughing.  Record updated.

## 2022-12-01 ENCOUNTER — Ambulatory Visit: Payer: Self-pay

## 2022-12-01 NOTE — Patient Outreach (Signed)
  Care Coordination   Follow Up Visit Note   12/01/2022 Name: Angie [name] MRN: [id number] DOB: 1946-01-09  Angie [name] is a 77 y.o. year old female who sees Doreene Eland, MD for primary care. I spoke with  Angie [name] by phone today.  What matters to the patients health and wellness today?  [name] underwent successful surgery on her left knee and has been receiving physical therapy. She is currently able to walk without difficulty, although one of her legs is shorter than the other. She experiences some discomfort in her knee when walking for more than a mile but finds relief by elevating it and applying Voltaren gel. After an hour, the pain subsides, and she feels better.    Goals Addressed             This Visit's Progress    I am trying to strengthen my left knee       Patient Goals/Self Care Activities: -Patient/Caregiver will self-administer medications as prescribed as evidenced by self-report/primary caregiver report  -Patient/Caregiver will attend all scheduled provider appointments as evidenced by clinician review of documented attendance to scheduled appointments and patient/caregiver report -Patient/Caregiver will call pharmacy for medication refills as evidenced by patient report and review of pharmacy fill history as appropriate -Patient/Caregiver will call provider office for new concerns or questions as evidenced by review of documented incoming telephone call notes and patient report -Patient/Caregiver verbalizes understanding of plan  -Patient will continue with stretches ands exercises and prescribed -Patient will use Ice -Patient will wear compression stockings Continue to do your exercises Take it slow Listen to your body        COMPLETED: She is walking well       Care Coordination Interventions:  Active listening / Reflection utilized  Emotional Support Provided Evaluation of current treatment plan and patient's  adherence to plan as established by provider Reviewed scheduled/upcoming provider appointments  Discussed plans with patient for ongoing care management follow up and provided patient with direct contact information for care management team         SDOH assessments and interventions completed:  No     Care Coordination Interventions:  Yes, provided    Interventions Today    Flowsheet Row Most Recent Value  Chronic Disease   Chronic disease during today's visit Other  [Left knee pain]  General Interventions   General Interventions Discussed/Reviewed General Interventions Discussed, Doctor Visits  Exercise Interventions   Exercise Discussed/Reviewed Exercise Discussed  Weight Management Weight loss, Weight maintenance  Nutrition Interventions   Nutrition Discussed/Reviewed Nutrition Discussed, Portion sizes  Safety Interventions   Safety Discussed/Reviewed Safety Discussed       Follow up plan: Follow up call scheduled for 06/05/23 11 am    Encounter Outcome:  Pt. Visit Completed   Juanell Fairly RN, BSN, [facility] Care Coordinator [facility]   Phone: [phone]

## 2022-12-01 NOTE — Patient Instructions (Signed)
Visit Information  Thank you for taking time to visit with me today. Please don't hesitate to contact me if I can be of assistance to you.   Following are the goals we discussed today:   Goals Addressed             This Visit's Progress    I am trying to strengthen my left knee       Patient Goals/Self Care Activities: -Patient/Caregiver will self-administer medications as prescribed as evidenced by self-report/primary caregiver report  -Patient/Caregiver will attend all scheduled provider appointments as evidenced by clinician review of documented attendance to scheduled appointments and patient/caregiver report -Patient/Caregiver will call pharmacy for medication refills as evidenced by patient report and review of pharmacy fill history as appropriate -Patient/Caregiver will call provider office for new concerns or questions as evidenced by review of documented incoming telephone call notes and patient report -Patient/Caregiver verbalizes understanding of plan  -Patient will continue with stretches ands exercises and prescribed -Patient will use Ice -Patient will wear compression stockings Continue to do your exercises Take it slow Listen to your body        COMPLETED: She is walking well       Care Coordination Interventions:  Active listening / Reflection utilized  Emotional Support Provided Evaluation of current treatment plan and patient's adherence to plan as established by provider Reviewed scheduled/upcoming provider appointments  Discussed plans with patient for ongoing care management follow up and provided patient with direct contact information for care management team         Our next appointment is by telephone on 06/05/23 at 11 am  Please call the care guide team at [phone] if you need to cancel or reschedule your appointment.   If you are experiencing a Mental Health or Behavioral Health Crisis or need someone to talk to, please call 1-800-273-TALK  (toll free, 24 hour hotline)  Patient verbalizes understanding of instructions and care plan provided today and agrees to view in MyChart. Active MyChart status and patient understanding of how to access instructions and care plan via MyChart confirmed with patient.     Juanell Fairly RN, BSN, [facility] Care Coordinator [facility]   Phone: [phone]

## 2022-12-04 NOTE — Telephone Encounter (Signed)
Received notification from [employer] regarding approval for OZEMPIC. Patient assistance approved from 11/29/22 to 11/23/23.  Medication will ship to office  Phone: [phone]

## 2022-12-12 DIAGNOSIS — I10 Essential (primary) hypertension: Secondary | ICD-10-CM | POA: Diagnosis not present

## 2022-12-12 DIAGNOSIS — Z1371 Encounter for nonprocreative screening for genetic disease carrier status: Secondary | ICD-10-CM | POA: Diagnosis not present

## 2022-12-12 DIAGNOSIS — G3109 Other frontotemporal dementia: Secondary | ICD-10-CM | POA: Diagnosis not present

## 2022-12-12 DIAGNOSIS — F419 Anxiety disorder, unspecified: Secondary | ICD-10-CM | POA: Diagnosis not present

## 2022-12-19 ENCOUNTER — Telehealth: Payer: Self-pay

## 2022-12-19 DIAGNOSIS — G3109 Other frontotemporal dementia: Secondary | ICD-10-CM | POA: Diagnosis not present

## 2022-12-19 DIAGNOSIS — I10 Essential (primary) hypertension: Secondary | ICD-10-CM | POA: Diagnosis not present

## 2022-12-19 DIAGNOSIS — F419 Anxiety disorder, unspecified: Secondary | ICD-10-CM | POA: Diagnosis not present

## 2022-12-19 DIAGNOSIS — Z818 Family history of other mental and behavioral disorders: Secondary | ICD-10-CM | POA: Diagnosis not present

## 2022-12-19 NOTE — Telephone Encounter (Signed)
Left message informing patient her [employer] [facility] shipment is ready for pickup.  5 boxes of Ozempic 0.25mg /0.5mg  dose pens are labeled in med room fridge (about a 3 month supply)  Directions: 0.25MG  FOR 4 WEEKS, THEN 0.5MG  FOR 4 WEEKS, THEN 1MG  THEREAFTER

## 2022-12-22 ENCOUNTER — Ambulatory Visit (INDEPENDENT_AMBULATORY_CARE_PROVIDER_SITE_OTHER): Payer: Medicare Other | Admitting: Pharmacist

## 2022-12-22 ENCOUNTER — Ambulatory Visit (INDEPENDENT_AMBULATORY_CARE_PROVIDER_SITE_OTHER): Payer: Medicare Other | Admitting: Family Medicine

## 2022-12-22 ENCOUNTER — Encounter: Payer: Self-pay | Admitting: Pharmacist

## 2022-12-22 ENCOUNTER — Encounter: Payer: Self-pay | Admitting: Family Medicine

## 2022-12-22 VITALS — BP 112/60 | HR 79 | Ht 63.0 in | Wt 214.2 lb

## 2022-12-22 DIAGNOSIS — I10 Essential (primary) hypertension: Secondary | ICD-10-CM | POA: Diagnosis not present

## 2022-12-22 DIAGNOSIS — R7303 Prediabetes: Secondary | ICD-10-CM | POA: Diagnosis not present

## 2022-12-22 NOTE — Assessment & Plan Note (Signed)
Continue lifestyle modification. Also starting Ozempic. Brief injection training provided by me and then she was handed over to the pharmacy clinic for her first self injection.

## 2022-12-22 NOTE — Patient Instructions (Addendum)
Nice to meet you today!  You did a great job administering your first dose in office.   START Ozempic (semaglutide) 0.25mg  once weekly for four weeks THEN increase to 0.5mg  weekly.   Let us know if you have any nausea that you would rate as more than mild.  Mild nausea may occur on day 2 or 3 following the day of injection.  This nausea will decrease with further injections.  As you increase the dose from 0.25 to 0.5mg  you may also experience a period of nausea.  Follow-up with Dr. Lum Babe.

## 2022-12-22 NOTE — Assessment & Plan Note (Signed)
Continue lifestyle modification. Also starting Ozempic. Brief injection training provided by me and then she was handed over to the pharmacy clinic for her first self injection. 

## 2022-12-22 NOTE — Progress Notes (Signed)
    SUBJECTIVE:   CHIEF COMPLAINT / HPI:   HTN: She is here for f/u. She complies with HCTZ 25 mg QD. She denies episodes of hypotension at home.   PreDM/Obese: Working on diet and exercise. Also here to pick up her Ozempic and receive injection training. No other concerns.    PERTINENT  PMH / PSH: PMHx reviewed  OBJECTIVE:   BP 112/60   Pulse 79   Ht 5\' 3"  (1.6 m)   Wt 214 lb 4 oz (97.2 kg)   SpO2 96%   BMI 37.95 kg/m   Physical Exam Vitals and nursing note reviewed.  Cardiovascular:     Rate and Rhythm: Normal rate and regular rhythm.     Heart sounds: Normal heart sounds. No murmur heard. Pulmonary:     Effort: Pulmonary effort is normal. No respiratory distress.     Breath sounds: Normal breath sounds. No wheezing.    Home BP Log    ASSESSMENT/PLAN:   HYPERTENSION, BENIGN SYSTEMIC BP looks good today. I also reviewed her home BP log which looks good. Continue current dose of HCTZ and close home BP monitoring. F/U in 2 months for reassessment.   Pre-diabetes Continue lifestyle modification. Also starting Ozempic. Brief injection training provided by me and then she was handed over to the pharmacy clinic for her first self injection.   Morbid obesity (HCC) Continue lifestyle modification. Also starting Ozempic. Brief injection training provided by me and then she was handed over to the pharmacy clinic for her first self injection.     Janit Pagan, MD [facility]

## 2022-12-22 NOTE — Assessment & Plan Note (Signed)
BP looks good today. I also reviewed her home BP log which looks good. Continue current dose of HCTZ and close home BP monitoring. F/U in 2 months for reassessment.

## 2022-12-22 NOTE — Progress Notes (Signed)
Asked by Dr. Lum Babe to provide education and assist with education with use of Ozempic (semaglutide) injection.   Patient educated on purpose, proper use and potential adverse effects of nausea.  Following instruction patient verbalized understanding of treatment plan and demonstrated appropriate technique while administering first dose in office.   Dosing instruction:  START Ozempic (semaglutide) 0.25mg  once weekly for four weeks THEN increase to 0.5mg  weekly.   Additional follow-up with PCP, Dr. Lum Babe planned.

## 2022-12-22 NOTE — Assessment & Plan Note (Signed)
Asked by Dr. Lum Babe to provide education and assist with education with use of Ozempic (semaglutide) injection.   Patient educated on purpose, proper use and potential adverse effects of nausea.  Following instruction patient verbalized understanding of treatment plan and demonstrated appropriate technique while administering first dose in office.   Dosing instruction:  START Ozempic (semaglutide) 0.25mg  once weekly for four weeks THEN increase to 0.5mg  weekly.

## 2022-12-22 NOTE — Patient Instructions (Signed)
Semaglutide Injection What is this medication? SEMAGLUTIDE (SEM a GLOO tide) treats type 2 diabetes. It works by increasing insulin levels in your body, which decreases your blood sugar (glucose). It also reduces the amount of sugar released into the blood and slows down your digestion. It can also be used to lower the risk of heart attack and stroke in people with type 2 diabetes. Changes to diet and exercise are often combined with this medication. This medicine may be used for other purposes; ask your health care provider or pharmacist if you have questions. COMMON BRAND NAME(S): OZEMPIC What should I tell my care team before I take this medication? They need to know if you have any of these conditions: Endocrine tumors (MEN 2) or if someone in your family had these tumors Eye disease, vision problems History of pancreatitis Kidney disease Stomach problems Thyroid cancer or if someone in your family had thyroid cancer An unusual or allergic reaction to semaglutide, other medications, foods, dyes, or preservatives Pregnant or trying to get pregnant Breast-feeding How should I use this medication? This medication is for injection under the skin of your upper leg (thigh), stomach area, or upper arm. It is given once every week (every 7 days). You will be taught how to prepare and give this medication. Use exactly as directed. Take your medication at regular intervals. Do not take it more often than directed. If you use this medication with insulin, you should inject this medication and the insulin separately. Do not mix them together. Do not give the injections right next to each other. Change (rotate) injection sites with each injection. It is important that you put your used needles and syringes in a special sharps container. Do not put them in a trash can. If you do not have a sharps container, call your pharmacist or care team to get one. A special MedGuide will be given to you by the  pharmacist with each prescription and refill. Be sure to read this information carefully each time. This medication comes with INSTRUCTIONS FOR USE. Ask your pharmacist for directions on how to use this medication. Read the information carefully. Talk to your pharmacist or care team if you have questions. Talk to your care team about the use of this medication in children. Special care may be needed. Overdosage: If you think you have taken too much of this medicine contact a poison control center or emergency room at once. NOTE: This medicine is only for you. Do not share this medicine with others. What if I miss a dose? If you miss a dose, take it as soon as you can within 5 days after the missed dose. Then take your next dose at your regular weekly time. If it has been longer than 5 days after the missed dose, do not take the missed dose. Take the next dose at your regular time. Do not take double or extra doses. If you have questions about a missed dose, contact your care team for advice. What may interact with this medication? Other medications for diabetes Many medications may cause changes in blood sugar, these include: Alcohol containing beverages Antiviral medications for HIV or AIDS Aspirin and aspirin-like medications Certain medications for blood pressure, heart disease, irregular heart beat Chromium Diuretics Female hormones, such as estrogens or progestins, birth control pills Fenofibrate Gemfibrozil Isoniazid Lanreotide Female hormones or anabolic steroids MAOIs like Carbex, Eldepryl, Marplan, Nardil, and Parnate Medications for weight loss Medications for allergies, asthma, cold, or cough Medications for depression,  anxiety, or psychotic disturbances Niacin Nicotine NSAIDs, medications for pain and inflammation, like ibuprofen or naproxen Octreotide Pasireotide Pentamidine Phenytoin Probenecid Quinolone antibiotics such as ciprofloxacin, levofloxacin, ofloxacin Some  herbal dietary supplements Steroid medications such as prednisone or cortisone Sulfamethoxazole; trimethoprim Thyroid hormones Some medications can hide the warning symptoms of low blood sugar (hypoglycemia). You may need to monitor your blood sugar more closely if you are taking one of these medications. These include: Beta-blockers, often used for high blood pressure or heart problems (examples include atenolol, metoprolol, propranolol) Clonidine Guanethidine Reserpine This list may not describe all possible interactions. Give your health care provider a list of all the medicines, herbs, non-prescription drugs, or dietary supplements you use. Also tell them if you smoke, drink alcohol, or use illegal drugs. Some items may interact with your medicine. What should I watch for while using this medication? Visit your care team for regular checks on your progress. Drink plenty of fluids while taking this medication. Check with your care team if you get an attack of severe diarrhea, nausea, and vomiting. The loss of too much body fluid can make it dangerous for you to take this medication. A test called the HbA1C (A1C) will be monitored. This is a simple blood test. It measures your blood sugar control over the last 2 to 3 months. You will receive this test every 3 to 6 months. Learn how to check your blood sugar. Learn the symptoms of low and high blood sugar and how to manage them. Always carry a quick-source of sugar with you in case you have symptoms of low blood sugar. Examples include hard sugar candy or glucose tablets. Make sure others know that you can choke if you eat or drink when you develop serious symptoms of low blood sugar, such as seizures or unconsciousness. They must get medical help at once. Tell your care team if you have high blood sugar. You might need to change the dose of your medication. If you are sick or exercising more than usual, you might need to change the dose of your  medication. Do not skip meals. Ask your care team if you should avoid alcohol. Many nonprescription cough and cold products contain sugar or alcohol. These can affect blood sugar. Pens should never be shared. Even if the needle is changed, sharing may result in passing of viruses like hepatitis or HIV. Wear a medical ID bracelet or chain, and carry a card that describes your disease and details of your medication and dosage times What side effects may I notice from receiving this medication? Side effects that you should report to your care team as soon as possible: Allergic reactions--skin rash, itching, hives, swelling of the face, lips, tongue, or throat Change in vision Dehydration--increased thirst, dry mouth, feeling faint or lightheaded, headache, dark yellow or brown urine Gallbladder problems--severe stomach pain, nausea, vomiting, fever Heart palpitations--rapid, pounding, or irregular heartbeat Kidney injury--decrease in the amount of urine, swelling of the ankles, hands, or feet Pancreatitis--severe stomach pain that spreads to your back or gets worse after eating or when touched, fever, nausea, vomiting Thoughts of suicide or self-harm, worsening mood, feelings of depression Thyroid cancer--new mass or lump in the neck, pain or trouble swallowing, trouble breathing, hoarseness Side effects that usually do not require medical attention (report these to your care team if they continue or are bothersome): Diarrhea Loss of appetite Nausea Upset stomach This list may not describe all possible side effects. Call your doctor for medical advice about  side effects. You may report side effects to FDA at 1-800-FDA-1088. Where should I keep my medication? Keep out of the reach of children. Store unopened pens in a refrigerator between 2 and 8 degrees C (36 and 46 degrees F). Do not freeze. Protect from light and heat. After you first use the pen, it can be stored for 56 days at room  temperature between 15 and 30 degrees C (59 and 86 degrees F) or in a refrigerator. Throw away your used pen after 56 days or after the expiration date, whichever comes first. Do not store your pen with the needle attached. If the needle is left on, medication may leak from the pen. NOTE: This sheet is a summary. It may not cover all possible information. If you have questions about this medicine, talk to your doctor, pharmacist, or health care provider.  https://www.google.com/search?sca_esv=361416631 [email or web address]&sca_upv=1&q=youtube+video+ozempic&tbm=vid&source=lnms&prmd=vinmbtz&sa=X&ved=2ahUKEwjMzeHp_[email or web address]&[email or web address]&bih=932#fpstate=ive&vld=cid:[email or web address],vid:[email or web address],st:0  2024 Elsevier/Gold Standard (2022-09-17 00:00:00)

## 2022-12-25 NOTE — Progress Notes (Signed)
Reviewed and agree with Dr Koval's plan.   

## 2023-01-05 ENCOUNTER — Other Ambulatory Visit: Payer: Self-pay | Admitting: Orthopaedic Surgery

## 2023-01-05 DIAGNOSIS — M25512 Pain in left shoulder: Secondary | ICD-10-CM

## 2023-01-05 DIAGNOSIS — M25571 Pain in right ankle and joints of right foot: Secondary | ICD-10-CM | POA: Diagnosis not present

## 2023-01-05 DIAGNOSIS — M25511 Pain in right shoulder: Secondary | ICD-10-CM | POA: Diagnosis not present

## 2023-01-10 ENCOUNTER — Encounter: Payer: Self-pay | Admitting: Family Medicine

## 2023-01-10 NOTE — Progress Notes (Signed)
Medical clearance form for surgery completed and placed in the front office for faxing.

## 2023-01-16 DIAGNOSIS — K219 Gastro-esophageal reflux disease without esophagitis: Secondary | ICD-10-CM | POA: Diagnosis not present

## 2023-01-18 ENCOUNTER — Telehealth: Payer: Self-pay

## 2023-01-18 NOTE — Telephone Encounter (Signed)
Patient calls nurse line requesting to speak with Dr. Raymondo Band in regards to [facility].   She reports tomorrow is her next injection date and she is going to start .5mg . She reports completing one month of .25mg . She reports nausea and constipation at first, however reports symptoms have resolved.   She reports she has concerns starting a new pen. She reports when she started 0.25mg  and "primed" the pen some of the medication came out. She would like to discuss this before starting a new pen.   Will forward to [place].

## 2023-01-23 NOTE — Telephone Encounter (Signed)
Patient contacted for follow-up of Ozempic (semaglutide) dosing and number of doses in each pen.  We had a poor connection of the phone but were able to complete conversation with second call back.   She administered first dose of 0.5mg  successfully.   Patient denies any significant medication related side effects.  Mild nausea and slight increase in constipation.   Medication Plan: -continue Ozempic (semaglutide  at 0.5mg  weekly.    Patient verbalized understanding of dosing and reports improved glycemic control.   Total time with patient call and documentation of interaction: 11 minutes.   F/U planned with PCP, Dr. Lum Babe 03/02/2023

## 2023-01-30 DIAGNOSIS — S46011D Strain of muscle(s) and tendon(s) of the rotator cuff of right shoulder, subsequent encounter: Secondary | ICD-10-CM | POA: Diagnosis not present

## 2023-01-30 DIAGNOSIS — M6281 Muscle weakness (generalized): Secondary | ICD-10-CM | POA: Diagnosis not present

## 2023-01-30 DIAGNOSIS — M25611 Stiffness of right shoulder, not elsewhere classified: Secondary | ICD-10-CM | POA: Diagnosis not present

## 2023-01-31 ENCOUNTER — Other Ambulatory Visit: Payer: Self-pay

## 2023-01-31 MED ORDER — TAMSULOSIN HCL 0.4 MG PO CAPS: 0.4000 mg | ORAL_CAPSULE | Freq: Every day | ORAL | 1 refills | Status: DC

## 2023-01-31 MED ORDER — TIZANIDINE HCL 4 MG PO TABS: 4.0000 mg | ORAL_TABLET | Freq: Every evening | ORAL | 1 refills | Status: AC | PRN

## 2023-01-31 MED ORDER — VALACYCLOVIR HCL 500 MG PO TABS: 500.0000 mg | ORAL_TABLET | Freq: Every day | ORAL | 1 refills | Status: DC

## 2023-01-31 MED ORDER — PREGABALIN 100 MG PO CAPS: 100.0000 mg | ORAL_CAPSULE | Freq: Two times a day (BID) | ORAL | 1 refills | Status: DC

## 2023-02-01 ENCOUNTER — Ambulatory Visit
Admission: RE | Admit: 2023-02-01 | Discharge: 2023-02-01 | Disposition: A | Discharge status: Home / Self Care | Payer: Medicare Other | Source: Ambulatory Visit | Attending: Orthopaedic Surgery | Admitting: Orthopaedic Surgery

## 2023-02-01 DIAGNOSIS — M24011 Loose body in right shoulder: Secondary | ICD-10-CM | POA: Diagnosis not present

## 2023-02-01 DIAGNOSIS — M19011 Primary osteoarthritis, right shoulder: Secondary | ICD-10-CM | POA: Diagnosis not present

## 2023-02-01 DIAGNOSIS — M25512 Pain in left shoulder: Secondary | ICD-10-CM

## 2023-02-20 ENCOUNTER — Telehealth: Payer: Self-pay

## 2023-02-20 DIAGNOSIS — M6281 Muscle weakness (generalized): Secondary | ICD-10-CM | POA: Diagnosis not present

## 2023-02-20 DIAGNOSIS — R262 Difficulty in walking, not elsewhere classified: Secondary | ICD-10-CM | POA: Diagnosis not present

## 2023-02-20 DIAGNOSIS — M7662 Achilles tendinitis, left leg: Secondary | ICD-10-CM | POA: Diagnosis not present

## 2023-02-20 NOTE — Telephone Encounter (Signed)
Faxed ozempic 1mg  dose pen increase to [employer].

## 2023-02-23 NOTE — Telephone Encounter (Signed)
1mg  dose increase rec'd and approved.  Medication should arrive in 10-14 business days.

## 2023-03-02 ENCOUNTER — Encounter: Payer: Self-pay | Admitting: Family Medicine

## 2023-03-02 ENCOUNTER — Ambulatory Visit (INDEPENDENT_AMBULATORY_CARE_PROVIDER_SITE_OTHER): Payer: Medicare HMO | Admitting: Family Medicine

## 2023-03-02 DIAGNOSIS — N951 Menopausal and female climacteric states: Secondary | ICD-10-CM

## 2023-03-02 DIAGNOSIS — R7303 Prediabetes: Secondary | ICD-10-CM

## 2023-03-02 MED ORDER — PREGABALIN 150 MG PO CAPS: 150.0000 mg | ORAL_CAPSULE | Freq: Two times a day (BID) | ORAL | 1 refills | Status: DC

## 2023-03-02 NOTE — Progress Notes (Signed)
    SUBJECTIVE:   CHIEF COMPLAINT / HPI:   PreDM/Weight management: She is compliant with Ozempic 0.5 mg QD and wonders if she can proceed to a 1 mg dose with her current 0.25/0.5 pen. She tried to twist her pen to 1mg , but it won't go further. She asked if it was ok to give 0.5 mg twice to make 1 mg. She also stated that she received a letter from [employer] that her Ozempic was shipped to our office.  Hot flashes: She is compliant with her Lyrica 100 mg BID for hot flashes. Despite this, her symptoms are becoming more frequent. She was previously on gabapentin, which worked for a while and stopped working. Hence, it was switched to Lyrica.   PERTINENT  PMH / PSH: PMHx reviewed  OBJECTIVE:   BP 129/71   Pulse 93   Wt 203 lb (92.1 kg)   SpO2 97%   BMI 35.96 kg/m   Physical Exam Vitals and nursing note reviewed.  Cardiovascular:     Rate and Rhythm: Normal rate and regular rhythm.     Heart sounds: Normal heart sounds. No murmur heard. Pulmonary:     Effort: Pulmonary effort is normal. No respiratory distress.     Breath sounds: Normal breath sounds. No wheezing.      ASSESSMENT/PLAN:   Morbid obesity (HCC) As discussed with her,  I have not received any information about her Ozempic 1 mg dose arriving at our clinic. I will message Camille/Pharm tech to f/u on this. I advised her to keep using Ozempic 0.5 mg till she receives her 1 mg pen to avoid dose confusion. F/U in Nov for A1C check She agreed with the plan.   NB: She will also ask the front office to send an RN message about her Ozempic shipment.  Pre-diabetes As discussed with her,  I have not received any information about her Ozempic 1 mg dose arriving at our clinic. I will message Camille/Pharm tech to f/u on this. I advised her to keep using Ozempic 0.5 mg till she receives her 1 mg pen to avoid dose confusion. F/U in Nov for A1C check She agreed with the plan.   Hot flash, menopausal I discussed switching  back to Gabapentin vs increasing the dose of her Lyrica She opted for the latter. She is unable to remember TID dosing. Hence, I switched her Lyrica to 150 mg BID, which is a better schedule for her. F/U soon if there is no further improvement in her symptoms.      Janit Pagan, MD [facility]

## 2023-03-02 NOTE — Assessment & Plan Note (Addendum)
As discussed with her,  I have not received any information about her Ozempic 1 mg dose arriving at our clinic. I will message Camille/Pharm tech to f/u on this. I advised her to keep using Ozempic 0.5 mg till she receives her 1 mg pen to avoid dose confusion. F/U in Nov for A1C check She agreed with the plan.   NB: She will also ask the front office to send an RN message about her Ozempic shipment.

## 2023-03-02 NOTE — Patient Instructions (Addendum)
 Managing Hot Flashes During Menopause You will learn what hot flashes/night sweats are, what causes them and what the treatment methods are. To view the content, go to this web address: https://pe.elsevier.com/[email or web address]  This video will expire on: 01/14/2025. If you need access to this video following this date, please reach out to the healthcare provider who assigned it to you. This information is not intended to replace advice given to you by your health care provider. Make sure you discuss any questions you have with your health care provider. Elsevier Patient Education  2024 [employer].

## 2023-03-02 NOTE — Assessment & Plan Note (Signed)
I discussed switching back to Gabapentin vs increasing the dose of her Lyrica She opted for the latter. She is unable to remember TID dosing. Hence, I switched her Lyrica to 150 mg BID, which is a better schedule for her. F/U soon if there is no further improvement in her symptoms.

## 2023-03-02 NOTE — Assessment & Plan Note (Signed)
As discussed with her,  I have not received any information about her Ozempic 1 mg dose arriving at our clinic. I will message Camille/Pharm tech to f/u on this. I advised her to keep using Ozempic 0.5 mg till she receives her 1 mg pen to avoid dose confusion. F/U in Nov for A1C check She agreed with the plan.

## 2023-03-05 DIAGNOSIS — Z01812 Encounter for preprocedural laboratory examination: Secondary | ICD-10-CM | POA: Diagnosis not present

## 2023-03-05 DIAGNOSIS — I10 Essential (primary) hypertension: Secondary | ICD-10-CM | POA: Diagnosis not present

## 2023-03-05 DIAGNOSIS — E119 Type 2 diabetes mellitus without complications: Secondary | ICD-10-CM | POA: Diagnosis not present

## 2023-03-05 DIAGNOSIS — M25611 Stiffness of right shoulder, not elsewhere classified: Secondary | ICD-10-CM | POA: Diagnosis not present

## 2023-03-05 DIAGNOSIS — H2512 Age-related nuclear cataract, left eye: Secondary | ICD-10-CM | POA: Diagnosis not present

## 2023-03-05 DIAGNOSIS — H2513 Age-related nuclear cataract, bilateral: Secondary | ICD-10-CM | POA: Diagnosis not present

## 2023-03-05 DIAGNOSIS — M25511 Pain in right shoulder: Secondary | ICD-10-CM | POA: Diagnosis not present

## 2023-03-05 DIAGNOSIS — S46011D Strain of muscle(s) and tendon(s) of the rotator cuff of right shoulder, subsequent encounter: Secondary | ICD-10-CM | POA: Diagnosis not present

## 2023-03-15 NOTE — Telephone Encounter (Signed)
Left message informing patient her novo nordisk shipment is ready for pickup.  4 boxes of ozempic 1mg  dose pens are labeled and ready in med room fridge.

## 2023-03-20 ENCOUNTER — Encounter: Payer: Self-pay | Admitting: Family Medicine

## 2023-03-20 ENCOUNTER — Ambulatory Visit (INDEPENDENT_AMBULATORY_CARE_PROVIDER_SITE_OTHER): Payer: Medicare HMO | Admitting: Family Medicine

## 2023-03-20 VITALS — BP 129/65 | HR 79 | Ht 63.0 in | Wt 200.6 lb

## 2023-03-20 DIAGNOSIS — M19011 Primary osteoarthritis, right shoulder: Secondary | ICD-10-CM | POA: Diagnosis not present

## 2023-03-20 DIAGNOSIS — R7303 Prediabetes: Secondary | ICD-10-CM

## 2023-03-20 DIAGNOSIS — Z6841 Body Mass Index (BMI) 40.0 and over, adult: Secondary | ICD-10-CM | POA: Insufficient documentation

## 2023-03-20 DIAGNOSIS — R202 Paresthesia of skin: Secondary | ICD-10-CM | POA: Diagnosis not present

## 2023-03-20 DIAGNOSIS — Z6835 Body mass index (BMI) 35.0-35.9, adult: Secondary | ICD-10-CM | POA: Diagnosis not present

## 2023-03-20 DIAGNOSIS — Z6834 Body mass index (BMI) 34.0-34.9, adult: Secondary | ICD-10-CM | POA: Insufficient documentation

## 2023-03-20 NOTE — Progress Notes (Signed)
    SUBJECTIVE:   CHIEF COMPLAINT / HPI:   Shoulder pain Per the patient, she has a right shoulder arthroplasty scheduled for 04/11/23 and wanted to know if she needed to hold her GLP1 per the orthopedic's request.   PreDM/Weight management: Now on Ozempic 1 mg weekly. Takes every Friday. Still has Ozempic 0.5 mg doses and wonder if she should bring it to [place] for disposal.  Finger tingling: C/O tingling sensation of her right ring and missle fingers x 2 months. This is worse at nighttime. Currently asymptomatic. She has hx of B/L CTS surgery done about 15 yrs ago. She has a wrist brace at home but does not wear it.    PERTINENT  PMH / PSH: PMHx reviewed  OBJECTIVE:   BP 129/65   Pulse 79   Ht 5\' 3"  (1.6 m)   Wt 200 lb 9.6 oz (91 kg)   SpO2 98%   BMI 35.53 kg/m   Physical Exam Vitals and nursing note reviewed.  Cardiovascular:     Rate and Rhythm: Normal rate and regular rhythm.     Heart sounds: Normal heart sounds. No murmur heard. Pulmonary:     Effort: Pulmonary effort is normal. No respiratory distress.     Breath sounds: Normal breath sounds. No wheezing.  Musculoskeletal:     Comments: Sensory exam of both hands and fingers are normal, tested with the monofilament. Good pulses, no lesions or ulcers, good peripheral pulses.  Neg Phalen and Tinel signs      ASSESSMENT/PLAN:   DJD of right shoulder Arthroscopic/arthoplasty scheduled Advised hold Ozempic at least one week prior to surgery and resume as usual the next Friday at a lower dose of 0.5 mg See AVS instruction Continue Tramadol prn pain  Pre-diabetes As discussed with her, she can double up on her 0.5 mg Ozempic dose to make 1 mg rather than discarding it as she already started the 1 mg dose She verbalized understanding  BMI 35.0-35.9,adult As discussed with her, she can double up on her 0.5 mg Ozempic dose to make 1 mg rather than discarding it as she already started the 1 mg dose She verbalized  understanding  Paresthesia Not quite the picture of carpal tunnel syndrome, but a possibility given her hx She had recently normal Vit B12 Conservative measures discussed with nightly wrist brace Consider repeat Vit B12 with TSH in the future + Neuro referral for nerve stimulation testing She agreed with the plan      Janit Pagan, MD [facility]

## 2023-03-20 NOTE — Assessment & Plan Note (Signed)
As discussed with her, she can double up on her 0.5 mg Ozempic dose to make 1 mg rather than discarding it as she already started the 1 mg dose She verbalized understanding

## 2023-03-20 NOTE — Assessment & Plan Note (Signed)
Not quite the picture of carpal tunnel syndrome, but a possibility given her hx She had recently normal Vit B12 Conservative measures discussed with nightly wrist brace Consider repeat Vit B12 with TSH in the future + Neuro referral for nerve stimulation testing She agreed with the plan

## 2023-03-20 NOTE — Assessment & Plan Note (Addendum)
Arthroscopic/arthoplasty scheduled Advised hold Ozempic at least one week prior to surgery and resume as usual the next Friday at a lower dose of 0.5 mg See AVS instruction Continue Tramadol prn pain

## 2023-03-20 NOTE — Patient Instructions (Addendum)
Nice seeing you today.  Regarding holding Ozempic for surgery.  Surgery date: 04/11/23  Hold Ozempic 03/30/23  Resume Ozempic: 04/13/23  Your may start at a lower dose of 0.5 mg weekly and then increase back to 1 mg weekly

## 2023-04-04 ENCOUNTER — Ambulatory Visit (INDEPENDENT_AMBULATORY_CARE_PROVIDER_SITE_OTHER): Payer: Medicare Other

## 2023-04-04 VITALS — Ht 63.0 in | Wt 200.0 lb

## 2023-04-04 DIAGNOSIS — Z0000 Encounter for general adult medical examination without abnormal findings: Secondary | ICD-10-CM | POA: Diagnosis not present

## 2023-04-04 NOTE — Patient Instructions (Signed)
Angie [name] , Thank you for taking time to come for your Medicare Wellness Visit. I appreciate your ongoing commitment to your health goals. Please review the following plan we discussed and let me know if I can assist you in the future.   Referrals/Orders/Follow-Ups/Clinician Recommendations: Aim for 30 minutes of exercise or brisk walking, 6-8 glasses of water, and 5 servings of fruits and vegetables each day.  This is a list of the screening recommended for you and due dates:  Health Maintenance  Topic Date Due   COVID-19 Vaccine (6 - 2023-24 season) 03/25/2023   Flu Shot  05/25/2023*   Hemoglobin A1C  05/27/2023   Medicare Annual Wellness Visit  04/03/2024   Cologuard (Stool DNA test)  05/04/2025   DTaP/Tdap/Td vaccine (3 - Td or Tdap) 03/17/2031   Pneumonia Vaccine  Completed   DEXA scan (bone density measurement)  Completed   Hepatitis C Screening  Completed   Zoster (Shingles) Vaccine  Completed   HPV Vaccine  Aged Out   Colon Cancer Screening  Discontinued  *Topic was postponed. The date shown is not the original due date.    Advanced directives: (In Chart) A copy of your advanced directives are scanned into your chart should your provider ever need it.  Next Medicare Annual Wellness Visit scheduled for next year: Yes

## 2023-04-04 NOTE — Progress Notes (Signed)
Subjective:   Angie [name] is a 77 y.o. female who presents for Medicare Annual (Subsequent) preventive examination.  Visit Complete: Virtual  I connected with  Angie [name] on 04/04/23 by a audio enabled telemedicine application and verified that I am speaking with the correct person using two identifiers.  Patient Location: Home  Provider Location: Home Office  I discussed the limitations of evaluation and management by telemedicine. The patient expressed understanding and agreed to proceed.  Vital Signs: Because this visit was a virtual/telehealth visit, some criteria may be missing or patient reported. Any vitals not documented were not able to be obtained and vitals that have been documented are patient reported.   Review of Systems     Cardiac Risk Factors include: advanced age (>25men, >3 women);dyslipidemia;hypertension     Objective:    Today's Vitals   04/04/23 1024  Weight: 200 lb (90.7 kg)  Height: 5\' 3"  (1.6 m)   Body mass index is 35.43 kg/m.     04/04/2023   10:30 AM 03/20/2023   10:37 AM 03/02/2023   10:19 AM 12/22/2022    9:13 AM 11/24/2022   10:06 AM 08/16/2022    4:43 PM 08/16/2022    4:22 PM  Advanced Directives  Does Patient Have a Medical Advance Directive? Yes No No No No No   Type of Estate agent of [place];Living will     Healthcare Power of B and E;Living will   Does patient want to make changes to medical advance directive? No - Patient declined        Copy of Healthcare Power of Attorney in Chart? Yes - validated most recent copy scanned in chart (See row information)     Yes - validated most recent copy scanned in chart (See row information)   Would patient like information on creating a medical advance directive?    No - Patient declined No - Patient declined  No - Patient declined    Current Medications (verified) Outpatient Encounter Medications as of 04/04/2023  Medication Sig   cycloSPORINE  (RESTASIS) 0.05 % ophthalmic emulsion Place 1 drop into both eyes 2 (two) times daily.   hydrochlorothiazide (HYDRODIURIL) 25 MG tablet Take 1 tablet (25 mg total) by mouth daily.   levocetirizine (XYZAL) 5 MG tablet Take 1 tablet (5 mg total) by mouth every evening.   mometasone (NASONEX) 50 MCG/ACT nasal spray Place 2 sprays into the nose daily.   Multiple Minerals-Vitamins (CAL MAG ZINC +D3) TABS Take 2 tablets by mouth daily.   Multiple Vitamin (MULTIVITAMIN) capsule Take 1 capsule by mouth See admin instructions. Alternate taking 1 tablet daily for one month then switch to ocuvite multivitamin daily for one month   Multiple Vitamins-Minerals (OCUVITE PO) Take 1 tablet by mouth See admin instructions. Alternate taking 1 tablet daily for one month then switch to regular multivitamin daily for one month   omeprazole (PRILOSEC) 40 MG capsule Take 40 mg by mouth 2 (two) times daily. Started by ENT for nighttime coughing   pregabalin (LYRICA) 150 MG capsule Take 1 capsule (150 mg total) by mouth 2 (two) times daily.   rosuvastatin (CRESTOR) 10 MG tablet Take 1 tablet (10 mg total) by mouth daily.   Semaglutide,0.25 or 0.5MG /DOS, 2 MG/1.5ML SOPN Inject 0.25 mg into the skin once a week. 0.25 mg once weekly for 4 weeks then increase to 0.5 mg weekly for at least 4 weeks,max 1 mg (Patient taking differently: Inject 1 mg into the skin once a week. 0.25  mg once weekly for 4 weeks then increase to 0.5 mg weekly for at least 4 weeks,max 1 mg)   senna-docusate (SENOKOT-S) 8.6-50 MG tablet Take 1 tablet by mouth daily as needed for mild constipation.   tamsulosin (FLOMAX) 0.4 MG CAPS capsule Take 1 capsule (0.4 mg total) by mouth daily.   valACYclovir (VALTREX) 500 MG tablet Take 1 tablet (500 mg total) by mouth daily.   acetaminophen (TYLENOL) 500 MG tablet Take 500 mg by mouth every 6 (six) hours as needed for moderate pain. (Patient not taking: Reported on 11/24/2022)   diclofenac Sodium (VOLTAREN) 1 % GEL  Apply 2 g topically 2 (two) times daily as needed (pain). (Patient not taking: Reported on 11/24/2022)   famotidine (PEPCID) 40 MG tablet Take 1 tablet (40 mg total) by mouth daily as needed for heartburn or indigestion. (Patient not taking: Reported on 03/02/2023)   melatonin 3 MG TABS tablet Take 3 mg by mouth at bedtime as needed (sleep). (Patient not taking: Reported on 11/24/2022)   SUMAtriptan (IMITREX) 25 MG tablet Take 1 tab for Migraine headache. May repeat dose 2-3 hrs later if there is no improvement, not more than 100 mg per day. (Patient not taking: Reported on 12/22/2022)   tiZANidine (ZANAFLEX) 4 MG tablet Take 1 tablet (4 mg total) by mouth at bedtime as needed for muscle spasms. (Patient not taking: Reported on 03/02/2023)   traMADol (ULTRAM) 50 MG tablet Take 1-2 tablets (50-100 mg total) by mouth every 6 (six) hours as needed for severe pain or moderate pain. (Patient not taking: Reported on 03/02/2023)   No facility-administered encounter medications on file as of 04/04/2023.    Allergies (verified) Patient has no known allergies.   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  No signs of active disease and low risk for reactivation.  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  Patient declines referral to health department to discuss at this time.    Pre-diabetes    Past Surgical History:  Procedure Laterality Date   CARPAL TUNNEL RELEASE Bilateral 2012   CHOLECYSTECTOMY  1982   MAXIMUM ACCESS (MAS)POSTERIOR LUMBAR INTERBODY FUSION  (PLIF) 2 LEVEL N/A 08/08/2018   Procedure: Decompression and  Fusion of Lumbar Four-Five Lumbar Five-Sacral One, Pedicle Screws of Lumbar Four- Lumbar Five, Lumbar Five- Sacral One, Lumbar Four-Lumbar Five Interbodies;  Surgeon: Maeola Harman, MD;  Location: [facility] OR;  Service: Neurosurgery;  Laterality: N/A;  Decompression and  Fusion of Lumbar Four-Five Lumbar Five-Sacral One, Pedicle Screws of Lumbar Four- Lumbar Five, Lumbar Fiv   ROTATOR CUFF REPAIR Bilateral 2009   TOTAL HIP ARTHROPLASTY Right 08/22/2021   Procedure: TOTAL HIP ARTHROPLASTY;  Surgeon: Joen Laura, MD;  Location: [facility] ORS;  Service: Orthopedics;  Laterality: Right;   TOTAL KNEE ARTHROPLASTY Left 08/16/2022   Procedure: TOTAL KNEE ARTHROPLASTY;  Surgeon: Joen Laura, MD;  Location: [facility] ORS;  Service: Orthopedics;  Laterality: Left;   TUBAL LIGATION     Family History  Problem Relation Age of Onset   Diabetes Mother    Stroke Mother    Hypertension Mother    Diabetes Father    Kidney disease Father    Heart attack Father    Diabetes Sister    Heart disease Sister    Heart attack Sister    Breast cancer Sister        30s   Diabetes Sister    Gout Sister    Alcohol abuse Brother    Social History   Socioeconomic History   Marital status: Married    Spouse name: Not on file   Number of children: Not on file   Years of education: Not on file   Highest education level: Not on file  Occupational History   Not on file  Tobacco Use   Smoking status: Former    Current packs/day: 0.00    Average packs/day: 0.5 packs/day for 30.0 years (15.0 ttl pk-yrs)    Types: Cigarettes    Start date: 07/25/1963    Quit date: 07/24/1993    Years since quitting: 29.7   Smokeless tobacco: Never  Vaping Use   Vaping status: Never Used  Substance and Sexual Activity   Alcohol use: No   Drug use: No   Sexual activity: Not Currently  Other Topics Concern   Not on file  Social History Narrative   Current Social History  04/25/2017            Patient lives with husband in one level home 04/25/2017   Transportation: Patient has own vehicle  04/25/2017   Important Relationships God, husband and children 04/25/2017    Pets: None 04/25/2017   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 This is an Systems developer but also can be a burden.  04/25/2017   L. Leward Quan, RN, BSN                                                                                                 Social Determinants of Health   Financial Resource Strain: Low Risk  (04/04/2023)   Overall Financial Resource Strain (CARDIA)    Difficulty of Paying Living Expenses: Not hard at all  Food Insecurity: No Food Insecurity (04/04/2023)   Hunger Vital Sign    Worried About Running Out of Food in the Last Year: Never true    Ran Out of Food in the Last Year: Never true  Transportation Needs: No Transportation Needs (04/04/2023)   PRAPARE - Administrator, Civil Service (Medical): No    Lack of Transportation (Non-Medical): No  Physical Activity: Insufficiently Active (04/04/2023)   Exercise Vital Sign    Days of Exercise per Week: 3 days    Minutes of Exercise per Session: 30 min  Stress: No Stress Concern  Present (04/04/2023)   [employer] of Occupational Health - Occupational Stress Questionnaire    Feeling of Stress : Not at all  Social Connections: Moderately Integrated (04/04/2023)   Social Connection and Isolation Panel [NHANES]    Frequency of Communication with Friends and Family: More than three times a week    Frequency of Social Gatherings with Friends and Family: Three times a week    Attends Religious Services: More than 4 times  per year    Active Member of Clubs or Organizations: No    Attends Banker Meetings: Never    Marital Status: Married    Tobacco Counseling Counseling given: Not Answered   Clinical Intake:  Pre-visit preparation completed: Yes  Pain : No/denies pain     Diabetes: No  How often do you need to have someone help you when you read instructions, pamphlets, or other written materials from your doctor or pharmacy?: 1 - Never  Interpreter Needed?: No  Information entered by :: Kandis Fantasia LPN   Activities of Daily Living    04/04/2023   10:30 AM 08/16/2022    4:22 PM  In your present state of health, do you have any difficulty performing the following activities:  Hearing? 0 0  Vision? 0 0  Difficulty concentrating or making decisions? 0 0  Walking or climbing stairs? 0 1  Dressing or bathing? 0 1  Doing errands, shopping? 0 0  Preparing Food and eating ? N   Using the Toilet? N   In the past six months, have you accidently leaked urine? N   Do you have problems with loss of bowel control? N   Managing your Medications? N   Managing your Finances? N   Housekeeping or managing your Housekeeping? N     Patient Care Team: Doreene Eland, MD as PCP - General (Family Medicine) Charna Elizabeth, MD as Consulting Physician (Gastroenterology) Coralie Carpen, MD as Consulting Physician (Family Medicine) Lunette Stands, MD as Referring Physician (Orthopedic Surgery) Marzella Schlein., MD (Ophthalmology)  Indicate any recent Medical Services you may have received from other than [facility] providers in the past year (date may be approximate).     Assessment:   This is a routine wellness examination for [place].  Hearing/Vision screen Hearing Screening - Comments:: Denies hearing difficulties   Vision Screening - Comments:: Wears rx glasses - up to date with routine eye exams with Dr. Velna Ochs     Goals Addressed             This Visit's Progress     COMPLETED: I am trying to strengthen my left knee       Patient Goals/Self Care Activities: -Patient/Caregiver will self-administer medications as prescribed as evidenced by self-report/primary caregiver report  -Patient/Caregiver will attend all scheduled provider appointments as evidenced by clinician review of documented attendance to scheduled appointments and patient/caregiver report -Patient/Caregiver will call pharmacy for medication refills as evidenced by patient report and review of pharmacy fill history as appropriate -Patient/Caregiver will call provider office for new concerns or questions as evidenced by review of documented incoming telephone call notes and patient report -Patient/Caregiver verbalizes understanding of plan  -Patient will continue with stretches ands exercises and prescribed -Patient will use Ice -Patient will wear compression stockings Continue to do your exercises Take it slow Listen to your body        Remain active and independent        Depression Screen    04/04/2023  10:28 AM 03/20/2023   10:37 AM 03/02/2023   10:18 AM 12/22/2022    9:14 AM 11/24/2022   10:12 AM 08/11/2022   10:17 AM 08/07/2022   11:38 AM  PHQ 2/9 Scores  PHQ - 2 Score 0 0 0 0 1 1 1   PHQ- 9 Score 2 2 2 5 6 5      Fall Risk    04/04/2023   10:29 AM 03/20/2023   10:37 AM 03/02/2023   10:22 AM 03/02/2023   10:19 AM 12/22/2022    9:13 AM  Fall Risk   Falls in the past year? 0 0 0 0 0  Number falls in past yr: 0 0 0 0 0  Injury with Fall? 0 0 0 0 0  Risk for fall due to : No Fall Risks      Follow up Falls prevention discussed;Education provided;Falls evaluation completed        MEDICARE RISK AT HOME: Medicare Risk at Home Any stairs in or around the home?: No If so, are there any without handrails?: No Home free of loose throw rugs in walkways, pet beds, electrical cords, etc?: Yes Adequate lighting in your home to reduce risk of falls?: Yes Life alert?: No Use of a cane,  walker or w/c?: No Grab bars in the bathroom?: Yes Shower chair or bench in shower?: No Elevated toilet seat or a handicapped toilet?: Yes  TIMED UP AND GO:  Was the test performed?  No    Cognitive Function:        04/04/2023   10:30 AM  6CIT Screen  What Year? 0 points  What month? 0 points  What time? 0 points  Count back from 20 0 points  Months in reverse 0 points  Repeat phrase 0 points  Total Score 0 points    Immunizations Immunization History  Administered Date(s) Administered   Fluad Quad(high Dose 65+) 07/27/2021, 04/18/2022   Influenza,inj,Quad PF,6+ Mos 04/25/2017, 05/22/2018, 07/06/2020   PFIZER Comirnaty(Gray Top)Covid-19 Tri-Sucrose Vaccine 12/29/2020   PFIZER(Purple Top)SARS-COV-2 Vaccination 09/07/2019, 09/30/2019, 05/26/2020   Pfizer Covid-19 Vaccine Bivalent Booster 58yrs & up 07/27/2021   Pneumococcal Conjugate-13 09/08/2014   Pneumococcal Polysaccharide-23 02/08/2016   Td 05/21/2009   Tdap 03/16/2021   Zoster Recombinant(Shingrix) 01/05/2022, 05/15/2022    TDAP status: Up to date  Flu Vaccine status: Due, Education has been provided regarding the importance of this vaccine. Advised may receive this vaccine at local pharmacy or Health Dept. Aware to provide a copy of the vaccination record if obtained from local pharmacy or Health Dept. Verbalized acceptance and understanding.  Pneumococcal vaccine status: Up to date  Covid-19 vaccine status: Information provided on how to obtain vaccines.   Qualifies for Shingles Vaccine? Yes   Zostavax completed No   Shingrix Completed?: Yes  Screening Tests Health Maintenance  Topic Date Due   COVID-19 Vaccine (6 - 2023-24 season) 03/25/2023   INFLUENZA VACCINE  05/25/2023 (Originally 02/22/2023)   HEMOGLOBIN A1C  05/27/2023   Medicare Annual Wellness (AWV)  04/03/2024   Fecal DNA (Cologuard)  05/04/2025   DTaP/Tdap/Td (3 - Td or Tdap) 03/17/2031   Pneumonia Vaccine 22+ Years old  Completed   DEXA  SCAN  Completed   Hepatitis C Screening  Completed   Zoster Vaccines- Shingrix  Completed   HPV VACCINES  Aged Out   Colonoscopy  Discontinued    Health Maintenance  Health Maintenance Due  Topic Date Due   COVID-19 Vaccine (6 - 2023-24 season) 03/25/2023  Colorectal cancer screening: Type of screening: Cologuard. Completed 05/04/22. Repeat every 3 years  Mammogram status: Completed 05/05/22. Repeat every year  Bone Density status: Completed 02/27/20. Results reflect: Bone density results: NORMAL. Repeat every 5 years.  Lung Cancer Screening: (Low Dose CT Chest recommended if Age 23-80 years, 20 pack-year currently smoking OR have quit w/in 15years.) does not qualify.   Lung Cancer Screening Referral: n/a  Additional Screening:  Hepatitis C Screening: does qualify; Completed 06/22/15  Vision Screening: Recommended annual ophthalmology exams for early detection of glaucoma and other disorders of the eye. Is the patient up to date with their annual eye exam?  Yes  Who is the provider or what is the name of the office in which the patient attends annual eye exams? Dr. Velna Ochs  If pt is not established with a provider, would they like to be referred to a provider to establish care? No .   Dental Screening: Recommended annual dental exams for proper oral hygiene  Community Resource Referral / Chronic Care Management: CRR required this visit?  No   CCM required this visit?  No     Plan:     I have personally reviewed and noted the following in the patient's chart:   Medical and social history Use of alcohol, tobacco or illicit drugs  Current medications and supplements including opioid prescriptions. Patient is not currently taking opioid prescriptions. Functional ability and status Nutritional status Physical activity Advanced directives List of other physicians Hospitalizations, surgeries, and ER visits in previous 12 months Vitals Screenings to include  cognitive, depression, and falls Referrals and appointments  In addition, I have reviewed and discussed with patient certain preventive protocols, quality metrics, and best practice recommendations. A written personalized care plan for preventive services as well as general preventive health recommendations were provided to patient.     Kandis Fantasia [place], [place]   [id number]/53/[id number]   After Visit Summary: (MyChart) Due to this being a telephonic visit, the after visit summary with patients personalized plan was offered to patient via MyChart   Nurse Notes: No concerns at this time

## 2023-04-05 ENCOUNTER — Other Ambulatory Visit: Payer: Self-pay

## 2023-04-05 MED ORDER — HYDROCHLOROTHIAZIDE 25 MG PO TABS: 25.0000 mg | ORAL_TABLET | Freq: Every day | ORAL | 1 refills | Status: DC

## 2023-04-05 MED ORDER — LEVOCETIRIZINE DIHYDROCHLORIDE 5 MG PO TABS: 5.0000 mg | ORAL_TABLET | Freq: Every evening | ORAL | 1 refills | Status: DC

## 2023-04-05 MED ORDER — TAMSULOSIN HCL 0.4 MG PO CAPS: 0.4000 mg | ORAL_CAPSULE | Freq: Every day | ORAL | 1 refills | Status: DC

## 2023-04-11 DIAGNOSIS — M25711 Osteophyte, right shoulder: Secondary | ICD-10-CM | POA: Diagnosis not present

## 2023-04-11 DIAGNOSIS — M75101 Unspecified rotator cuff tear or rupture of right shoulder, not specified as traumatic: Secondary | ICD-10-CM | POA: Diagnosis not present

## 2023-04-11 DIAGNOSIS — G8918 Other acute postprocedural pain: Secondary | ICD-10-CM | POA: Diagnosis not present

## 2023-04-11 DIAGNOSIS — M19011 Primary osteoarthritis, right shoulder: Secondary | ICD-10-CM | POA: Diagnosis not present

## 2023-04-11 DIAGNOSIS — M659 Synovitis and tenosynovitis, unspecified: Secondary | ICD-10-CM | POA: Diagnosis not present

## 2023-04-11 DIAGNOSIS — M19111 Post-traumatic osteoarthritis, right shoulder: Secondary | ICD-10-CM | POA: Diagnosis not present

## 2023-04-16 DIAGNOSIS — M6281 Muscle weakness (generalized): Secondary | ICD-10-CM | POA: Diagnosis not present

## 2023-04-16 DIAGNOSIS — M19011 Primary osteoarthritis, right shoulder: Secondary | ICD-10-CM | POA: Diagnosis not present

## 2023-04-16 DIAGNOSIS — M25611 Stiffness of right shoulder, not elsewhere classified: Secondary | ICD-10-CM | POA: Diagnosis not present

## 2023-04-19 DIAGNOSIS — M19011 Primary osteoarthritis, right shoulder: Secondary | ICD-10-CM | POA: Diagnosis not present

## 2023-04-23 DIAGNOSIS — M25611 Stiffness of right shoulder, not elsewhere classified: Secondary | ICD-10-CM | POA: Diagnosis not present

## 2023-04-23 DIAGNOSIS — M19011 Primary osteoarthritis, right shoulder: Secondary | ICD-10-CM | POA: Diagnosis not present

## 2023-04-23 DIAGNOSIS — M6281 Muscle weakness (generalized): Secondary | ICD-10-CM | POA: Diagnosis not present

## 2023-05-01 ENCOUNTER — Other Ambulatory Visit: Payer: Self-pay

## 2023-05-01 MED ORDER — ROSUVASTATIN CALCIUM 10 MG PO TABS: 10.0000 mg | ORAL_TABLET | Freq: Every day | ORAL | 1 refills | Status: DC

## 2023-05-02 DIAGNOSIS — M6281 Muscle weakness (generalized): Secondary | ICD-10-CM | POA: Diagnosis not present

## 2023-05-02 DIAGNOSIS — M19011 Primary osteoarthritis, right shoulder: Secondary | ICD-10-CM | POA: Diagnosis not present

## 2023-05-02 DIAGNOSIS — M25611 Stiffness of right shoulder, not elsewhere classified: Secondary | ICD-10-CM | POA: Diagnosis not present

## 2023-05-10 DIAGNOSIS — M25611 Stiffness of right shoulder, not elsewhere classified: Secondary | ICD-10-CM | POA: Diagnosis not present

## 2023-05-10 DIAGNOSIS — M6281 Muscle weakness (generalized): Secondary | ICD-10-CM | POA: Diagnosis not present

## 2023-05-10 DIAGNOSIS — M19011 Primary osteoarthritis, right shoulder: Secondary | ICD-10-CM | POA: Diagnosis not present

## 2023-05-15 DIAGNOSIS — M25561 Pain in right knee: Secondary | ICD-10-CM | POA: Diagnosis not present

## 2023-05-15 DIAGNOSIS — M19011 Primary osteoarthritis, right shoulder: Secondary | ICD-10-CM | POA: Diagnosis not present

## 2023-05-23 ENCOUNTER — Telehealth: Payer: Self-pay | Admitting: *Deleted

## 2023-05-23 DIAGNOSIS — M6281 Muscle weakness (generalized): Secondary | ICD-10-CM | POA: Diagnosis not present

## 2023-05-23 DIAGNOSIS — M19011 Primary osteoarthritis, right shoulder: Secondary | ICD-10-CM | POA: Diagnosis not present

## 2023-05-23 DIAGNOSIS — M25611 Stiffness of right shoulder, not elsewhere classified: Secondary | ICD-10-CM | POA: Diagnosis not present

## 2023-05-23 NOTE — Progress Notes (Signed)
Care Coordination Note  05/23/2023 Name: Angie [name] MRN: [id number] DOB: [birth year]  Angie [name] is a 77 y.o. year old female who is a primary care patient of Doreene Eland, MD and is actively engaged with the care management team. I reached out to [name] by phone today to assist with re-scheduling a follow up visit with the RN Case Manager  Follow up plan: Telephone appointment with care management team member scheduled for:12/11  [facility] Coordination Care Guide  Direct Dial: [phone]

## 2023-05-23 NOTE — Progress Notes (Signed)
Care Coordination Note  05/23/2023 Name: Margherita Reid-White MRN: [id number] DOB: [birth year]  Lacye Reid-White is a 77 y.o. year old female who is a primary care patient of Doreene Eland, MD and is actively engaged with the care management team. I reached out to [place] Reid-White by phone today to assist with re-scheduling a follow up visit with the RN Case Manager  Follow up plan: Unsuccessful telephone outreach attempt made. A HIPAA compliant phone message was left for the patient providing contact information and requesting a return call.   [facility]  Care Coordination Care Guide  Direct Dial: [phone]

## 2023-05-31 DIAGNOSIS — H2512 Age-related nuclear cataract, left eye: Secondary | ICD-10-CM | POA: Diagnosis not present

## 2023-06-01 DIAGNOSIS — H2511 Age-related nuclear cataract, right eye: Secondary | ICD-10-CM | POA: Diagnosis not present

## 2023-06-01 DIAGNOSIS — H25041 Posterior subcapsular polar age-related cataract, right eye: Secondary | ICD-10-CM | POA: Diagnosis not present

## 2023-06-06 DIAGNOSIS — M25611 Stiffness of right shoulder, not elsewhere classified: Secondary | ICD-10-CM | POA: Diagnosis not present

## 2023-06-06 DIAGNOSIS — M19011 Primary osteoarthritis, right shoulder: Secondary | ICD-10-CM | POA: Diagnosis not present

## 2023-06-06 DIAGNOSIS — M6281 Muscle weakness (generalized): Secondary | ICD-10-CM | POA: Diagnosis not present

## 2023-06-13 ENCOUNTER — Other Ambulatory Visit: Payer: Self-pay

## 2023-06-13 DIAGNOSIS — M6281 Muscle weakness (generalized): Secondary | ICD-10-CM | POA: Diagnosis not present

## 2023-06-13 DIAGNOSIS — M19011 Primary osteoarthritis, right shoulder: Secondary | ICD-10-CM | POA: Diagnosis not present

## 2023-06-13 DIAGNOSIS — M25611 Stiffness of right shoulder, not elsewhere classified: Secondary | ICD-10-CM | POA: Diagnosis not present

## 2023-06-13 MED ORDER — SUMATRIPTAN SUCCINATE 25 MG PO TABS: ORAL_TABLET | ORAL | 2 refills | Status: AC

## 2023-06-14 DIAGNOSIS — H2511 Age-related nuclear cataract, right eye: Secondary | ICD-10-CM | POA: Diagnosis not present

## 2023-06-19 DIAGNOSIS — M6281 Muscle weakness (generalized): Secondary | ICD-10-CM | POA: Diagnosis not present

## 2023-06-19 DIAGNOSIS — M19011 Primary osteoarthritis, right shoulder: Secondary | ICD-10-CM | POA: Diagnosis not present

## 2023-06-19 DIAGNOSIS — M25611 Stiffness of right shoulder, not elsewhere classified: Secondary | ICD-10-CM | POA: Diagnosis not present

## 2023-06-27 DIAGNOSIS — M19011 Primary osteoarthritis, right shoulder: Secondary | ICD-10-CM | POA: Diagnosis not present

## 2023-06-27 DIAGNOSIS — M6281 Muscle weakness (generalized): Secondary | ICD-10-CM | POA: Diagnosis not present

## 2023-06-27 DIAGNOSIS — M25611 Stiffness of right shoulder, not elsewhere classified: Secondary | ICD-10-CM | POA: Diagnosis not present

## 2023-07-04 ENCOUNTER — Ambulatory Visit: Payer: Self-pay

## 2023-07-04 NOTE — Patient Instructions (Signed)
Visit Information  Thank you for allowing me to share the  care coordination services that are available to you as part of your health plan and services through your primary care provider. Please reach out to your Office if the care coordination team may be of assistance to you in the future.   Juanell Fairly RN, BSN, [facility] Care Management Coordinator [facility], [facility]  Phone: [phone]

## 2023-07-04 NOTE — Patient Outreach (Signed)
  Care Coordination   Follow Up Visit Note   07/04/2023 Name: Angie [name] MRN: [id number] DOB: 1945-11-06  Angie [name] is a 77 y.o. year old female who sees Doreene Eland, MD for primary care. I spoke with  Angie [name] by phone today.  What matters to the patients health and wellness today?  Angie [name] is recuperating effectively following her hip replacement surgery. In addition, she has successfully undergone rotator cuff surgery and is now approaching her final two physical therapy appointments. She has also had cataracts removed from both eyes and has experienced a weight loss of 22 pounds. Angie [name] is actively taking Ozempic and reports that she is progressing well. Today marks my final visit with her. She has expressed that she does not require any additional follow-up calls, as she is confident in her understanding of the necessary steps moving forward. Should she need assistance, she intends to contact her physician directly.    SDOH assessments and interventions completed:  No     Care Coordination Interventions:  Yes, provided   Interventions Today    Flowsheet Row Most Recent Value  Chronic Disease   Chronic disease during today's visit Other  [Left Knee]  General Interventions   General Interventions Discussed/Reviewed General Interventions Discussed  Nutrition Interventions   Nutrition Discussed/Reviewed Nutrition Discussed  Pharmacy Interventions   Pharmacy Dicussed/Reviewed Pharmacy Topics Discussed  Safety Interventions   Safety Discussed/Reviewed Safety Discussed        Follow up plan: No further intervention required.   Encounter Outcome:  Patient Visit Completed   Juanell Fairly RN, BSN, [facility]  [facility], [facility]  Care Coordinator Phone: [phone]

## 2023-07-05 DIAGNOSIS — M19011 Primary osteoarthritis, right shoulder: Secondary | ICD-10-CM | POA: Diagnosis not present

## 2023-07-05 DIAGNOSIS — M25611 Stiffness of right shoulder, not elsewhere classified: Secondary | ICD-10-CM | POA: Diagnosis not present

## 2023-07-05 DIAGNOSIS — M6281 Muscle weakness (generalized): Secondary | ICD-10-CM | POA: Diagnosis not present

## 2023-07-09 DIAGNOSIS — M6281 Muscle weakness (generalized): Secondary | ICD-10-CM | POA: Diagnosis not present

## 2023-07-09 DIAGNOSIS — M25611 Stiffness of right shoulder, not elsewhere classified: Secondary | ICD-10-CM | POA: Diagnosis not present

## 2023-07-09 DIAGNOSIS — M19011 Primary osteoarthritis, right shoulder: Secondary | ICD-10-CM | POA: Diagnosis not present

## 2023-07-13 ENCOUNTER — Telehealth: Payer: Self-pay | Admitting: Family Medicine

## 2023-07-13 NOTE — Telephone Encounter (Signed)
I received a form completion request from [facility]. However, this was not requested by me and during last visit, she does have wrist bbrace at home for CTS. I called her about this and she stated that she never requested wrist brace or orthosis for her Carpal Tunnel Syndrome and suggested I disregard it. For shred.

## 2023-07-30 ENCOUNTER — Other Ambulatory Visit (HOSPITAL_COMMUNITY): Payer: Self-pay

## 2023-07-30 ENCOUNTER — Telehealth: Payer: Self-pay

## 2023-07-30 NOTE — Progress Notes (Deleted)
 Pharmacy Medication Assistance Program Note    08/07/2023  Patient ID: Angie [name], female   DOB: Jun 14, 1946, 78 y.o.   MRN: [id number]     07/30/2023  Outreach Medication One  Manufacturer Medication One Novo Nordisk  Nordisk Drugs Ozempic   Dose of Ozempic  1MG  weekly  Type of Radiographer, Therapeutic Assistance  Date Application Sent to Prescriber 07/31/2023  Name of Prescriber Otto Fairly  Date Application Submitted to Manufacturer 07/30/2023  Method Application Sent to Manufacturer Online     Submitted patient portion online. Provider pages faxed 08/07/23.

## 2023-08-03 ENCOUNTER — Encounter: Payer: Self-pay | Admitting: Family Medicine

## 2023-08-03 ENCOUNTER — Ambulatory Visit (INDEPENDENT_AMBULATORY_CARE_PROVIDER_SITE_OTHER): Payer: Medicare Other | Admitting: Family Medicine

## 2023-08-03 VITALS — BP 127/65 | HR 76 | Ht 63.0 in | Wt 193.0 lb

## 2023-08-03 DIAGNOSIS — Z6841 Body Mass Index (BMI) 40.0 and over, adult: Secondary | ICD-10-CM

## 2023-08-03 DIAGNOSIS — R413 Other amnesia: Secondary | ICD-10-CM | POA: Diagnosis not present

## 2023-08-03 DIAGNOSIS — Z114 Encounter for screening for human immunodeficiency virus [HIV]: Secondary | ICD-10-CM

## 2023-08-03 DIAGNOSIS — R4189 Other symptoms and signs involving cognitive functions and awareness: Secondary | ICD-10-CM | POA: Diagnosis not present

## 2023-08-03 DIAGNOSIS — Z7289 Other problems related to lifestyle: Secondary | ICD-10-CM

## 2023-08-03 DIAGNOSIS — R7303 Prediabetes: Secondary | ICD-10-CM | POA: Diagnosis not present

## 2023-08-03 DIAGNOSIS — Z113 Encounter for screening for infections with a predominantly sexual mode of transmission: Secondary | ICD-10-CM

## 2023-08-03 LAB — POCT GLYCOSYLATED HEMOGLOBIN (HGB A1C): Hemoglobin A1C: 5.4 % (ref 4.0–5.6)

## 2023-08-03 NOTE — Assessment & Plan Note (Signed)
 May be age related Lab test +/- MRI discussed She want to get lab checked first prior to imaging TSH, HIV, RPR, Vit B12 checked Exercise, reading and board games encouraged to improve her brain muscle Consider pharmacologic agents in the future if appropriate She agreed with the plan

## 2023-08-03 NOTE — Progress Notes (Signed)
    SUBJECTIVE:   CHIEF COMPLAINT / HPI:   PreDM2/Weight management: She is doing well now on Ozempic  1 mg weekly. No concerns.   Memory: C/O occasional lapse in memory, which seems to be more frequent than usual. She is worried about this. Sometimes, she knows what she wants to say but can't get the word out.   HM: Need COVID-19 and flu shot.  PERTINENT  PMH / PSH: PMHx reviewed  OBJECTIVE:   BP 127/65   Pulse 76   Ht 5' 3 (1.6 m)   Wt 193 lb (87.5 kg)   SpO2 97%   BMI 34.19 kg/m   Physical Exam Vitals and nursing note reviewed.  Cardiovascular:     Rate and Rhythm: Normal rate and regular rhythm.     Heart sounds: Normal heart sounds. No murmur heard. Pulmonary:     Effort: Pulmonary effort is normal. No respiratory distress.     Breath sounds: Normal breath sounds. No wheezing.      ASSESSMENT/PLAN:   Pre-diabetes A1C remains in the prediabetic range Monitor on Ozempic  and lifestyle changes  BMI 40.0-44.9, adult (HCC) Her weight goal of 180, currently at 193 Improved on Ozempic  Tolerating I mg weekly dose Monitor weight loss closely We discussed indications for weaning off Ozempic  We will initiate weaning or maintenance dose once she attains her weight loss goal  Cognitive change May be age related Lab test +/- MRI discussed She want to get lab checked first prior to imaging TSH, HIV, RPR, Vit B12 checked Exercise, reading and board games encouraged to improve her brain muscle Consider pharmacologic agents in the future if appropriate She agreed with the plan   She declined influenza and COVID shots.   Otto Fairly, MD [facility]

## 2023-08-03 NOTE — Assessment & Plan Note (Signed)
 Her weight goal of 180, currently at 193 Improved on Ozempic Tolerating I mg weekly dose Monitor weight loss closely We discussed indications for weaning off Ozempic We will initiate weaning or maintenance dose once she attains her weight loss goal

## 2023-08-03 NOTE — Assessment & Plan Note (Signed)
 A1C remains in the prediabetic range Monitor on Ozempic and lifestyle changes

## 2023-08-03 NOTE — Patient Instructions (Signed)
Mild Neurocognitive Disorder Mild neurocognitive disorder, formerly known as mild cognitive impairment, is a disorder in which memory does not work as well as it should. This disorder may also cause problems with other mental functions, including thought, communication, behavior, and completion of tasks. These problems can be noticed and measured, but they usually do not interfere with daily activities or the ability to live independently. Mild neurocognitive disorder typically develops after 77 years of age, but it can also develop at younger ages. It is not as serious as major neurocognitive disorder, also known as dementia, but it may be the first sign of it. Generally, symptoms of this condition get worse over time. In rare cases, symptoms can get better. What are the causes? This condition may be caused by: Brain disorders like Alzheimer's disease, Parkinson's disease, and other conditions that gradually damage nerve cells (neurodegenerative conditions). Diseases that affect blood vessels in the brain and result in small strokes. Certain infections, such as HIV. Traumatic brain injury. Other medical conditions, such as brain tumors, underactive thyroid (hypothyroidism), and vitamin B12 deficiency. Use of certain drugs or prescription medicines. What increases the risk? The following factors may make you more likely to develop this condition: Being older than 65 years. Being female. Low education level. Diabetes, high blood pressure, high cholesterol, and other conditions that increase the risk for blood vessel diseases. Untreated or undertreated sleep apnea. Having a certain type of gene that can be passed from parent to child (inherited). Chronic health problems such as heart disease, lung disease, liver disease, kidney disease, or depression. What are the signs or symptoms? Symptoms of this condition include: Difficulty remembering. You may: Forget names, phone numbers, or details of  recent events. Forget social events and appointments. Repeatedly forget where you put your car keys or other items. Difficulty thinking and solving problems. You may have trouble with complex tasks, such as: Paying bills. Driving in unfamiliar places. Difficulty communicating. You may have trouble: Finding the right word or naming an object. Forming a sentence that makes sense, or understanding what you read or hear. Changes in your behavior or personality. When this happens, you may: Lose interest in the things that you used to enjoy. Withdraw from social situations. Get angry more easily than usual. Act before thinking. How is this diagnosed? This condition is diagnosed based on: Your symptoms. Your health care provider may ask you and the people you spend time with, such as family and friends, about your symptoms. Evaluation of mental functions (neuropsychological testing). Your health care provider may refer you to a neurologist or mental health specialist to evaluate your mental functions in detail. To identify the cause of your condition, your health care provider may: Get a detailed medical history. Ask about use of alcohol, drugs, and prescription medicines. Do a physical exam. Order blood tests and brain imaging exams. How is this treated? Mild neurocognitive disorder that is caused by medicine use, drug use, infection, or another medical condition may improve when the cause is treated, or when medicines or drugs are stopped. If this disorder has another cause, it generally does not improve and may get worse. In these cases, the goal of treatment is to help you manage the loss of mental function. Treatments in these cases include: Medicine. Medicine mainly helps memory and behavior symptoms. Talk therapy. Talk therapy provides education, emotional support, memory aids, and other ways of making up for problems with mental function. Lifestyle changes, including: Getting regular  exercise. Eating a healthy diet  that includes omega-3 fatty acids. Challenging your thinking and memory skills. Having more social interaction. Follow these instructions at home: Eating and drinking  Drink enough fluid to keep your urine pale yellow. Eat a healthy diet that includes omega-3 fatty acids. These can be found in: Fish. Nuts. Leafy vegetables. Vegetable oils. If you drink alcohol: Limit how much you use to: 0-1 drink a day for women. 0-2 drinks a day for men. Be aware of how much alcohol is in your drink. In the [place] one drink equals one 12 oz bottle of beer (355 mL), one 5 oz glass of wine (148 mL), or one 1 oz glass of hard liquor (44 mL). Lifestyle  Get regular exercise as told by your health care provider. Do not use any products that contain nicotine or tobacco, such as cigarettes, e-cigarettes, and chewing tobacco. If you need help quitting, ask your health care provider. Practice ways to manage stress. If you need help managing stress, ask your health care provider. Continue to have social interaction. Keep your mind active with stimulating activities you enjoy, such as reading or playing games. Make sure to get quality sleep. Follow these tips: Avoid napping during the day. Keep your sleeping area dark and cool. Avoid exercising during the few hours before you go to bed. Avoid caffeine products in the evening. General instructions Take over-the-counter and prescription medicines only as told by your health care provider. Your health care provider may recommend that you avoid taking medicines that can affect thinking, such as pain medicines or sleep medicines. Work with your health care provider to find out what you need help with and what your safety needs are. Keep all follow-up visits. This is important. Where to find more information [employer] on Aging: https://walker.com/ Contact a health care provider if: You have any new symptoms. Get help right  away if: You develop new confusion or your confusion gets worse. You act in ways that place you or your family in danger. Summary Mild neurocognitive disorder is a disorder in which memory does not work as well as it should. Mild neurocognitive disorder can have many causes. It may be the first stage of dementia. To manage your condition, get regular exercise, keep your mind active, get quality sleep, and eat a healthy diet. This information is not intended to replace advice given to you by your health care provider. Make sure you discuss any questions you have with your health care provider. Document Revised: 11/24/2019 Document Reviewed: 11/24/2019 Elsevier Patient Education  2024 [employer].

## 2023-08-04 ENCOUNTER — Encounter: Payer: Self-pay | Admitting: Family Medicine

## 2023-08-04 LAB — RPR: RPR Ser Ql: NONREACTIVE

## 2023-08-04 LAB — HIV ANTIBODY (ROUTINE TESTING W REFLEX): HIV Screen 4th Generation wRfx: NONREACTIVE

## 2023-08-04 LAB — VITAMIN B12: Vitamin B-12: 797 pg/mL (ref 232–1245)

## 2023-08-04 LAB — TSH: TSH: 1.58 u[IU]/mL (ref 0.450–4.500)

## 2023-08-07 DIAGNOSIS — M19011 Primary osteoarthritis, right shoulder: Secondary | ICD-10-CM | POA: Diagnosis not present

## 2023-08-13 NOTE — Progress Notes (Addendum)
Pharmacy Medication Assistance Program Note    08/13/2023  Patient ID: Angie [name], female   DOB: 1946-01-23, 78 y.o.   MRN: [id number]     07/30/2023  Outreach Medication One  Manufacturer Medication One [employer] Drugs Ozempic  Dose of Ozempic 1MG  weekly  Type of Radiographer, therapeutic Assistance  Date Application Sent to Prescriber 07/31/2023  Name of Prescriber Janit Pagan  Date Application Submitted to Manufacturer 07/30/2023  Method Application Sent to Manufacturer Online  Patient Assistance Determination Approved  Approval Start Date 08/09/2023  Approval End Date 11/23/2023    Previously approved until 11/23/23, will renew in 10/2023.

## 2023-08-29 ENCOUNTER — Other Ambulatory Visit (HOSPITAL_COMMUNITY): Payer: Self-pay

## 2023-08-30 ENCOUNTER — Telehealth: Payer: Self-pay | Admitting: Pharmacist

## 2023-08-30 NOTE — Telephone Encounter (Signed)
 Reviewed and agree with Dr Macky Lower plan.

## 2023-08-30 NOTE — Telephone Encounter (Signed)
 Patient contacted for follow-up of Ozempic  (semaglutide ) supply issue.   Since last contact patient reports she has discovered she has > 1 full pen of the 0.25 and 0.5 pen dosage form.   She recognized she could do two doses of the 0.5 mg dose tomorrow to replace the 1mg  dose she does not have in hand at this time.  We also discussed she could finish that same pen next week by doing the same plan (using up an entire low dose pen).  She was comfortable with this plan.   She thanked me for the call and was relieved to here that her 1mg  shipment is on the way and should arrive soon.    Total time with patient call and documentation of interaction: 12 minutes.

## 2023-09-04 ENCOUNTER — Other Ambulatory Visit: Payer: Self-pay | Admitting: Family Medicine

## 2023-09-04 ENCOUNTER — Other Ambulatory Visit: Payer: Self-pay

## 2023-09-04 ENCOUNTER — Encounter: Payer: Self-pay | Admitting: Family Medicine

## 2023-09-04 MED ORDER — PREGABALIN 150 MG PO CAPS: 150.0000 mg | ORAL_CAPSULE | Freq: Two times a day (BID) | ORAL | 1 refills | Status: DC

## 2023-09-04 MED ORDER — ROSUVASTATIN CALCIUM 10 MG PO TABS: 10.0000 mg | ORAL_TABLET | Freq: Every day | ORAL | 1 refills | Status: DC

## 2023-09-04 MED ORDER — VALACYCLOVIR HCL 500 MG PO TABS: 500.0000 mg | ORAL_TABLET | Freq: Every day | ORAL | 1 refills | Status: DC

## 2023-09-04 MED ORDER — LEVOCETIRIZINE DIHYDROCHLORIDE 5 MG PO TABS: 5.0000 mg | ORAL_TABLET | Freq: Every evening | ORAL | 1 refills | Status: DC

## 2023-09-06 ENCOUNTER — Other Ambulatory Visit: Payer: Self-pay | Admitting: Family Medicine

## 2023-09-06 MED ORDER — PREGABALIN 150 MG PO CAPS: 150.0000 mg | ORAL_CAPSULE | Freq: Two times a day (BID) | ORAL | 1 refills | Status: DC

## 2023-09-11 ENCOUNTER — Telehealth: Payer: Self-pay

## 2023-09-11 NOTE — Telephone Encounter (Signed)
 Informed patient her novo nordisk shipment is ready for pickup.  4 boxes of Ozempic 1mg  pens ready and labeled in med room fridge - 4 month supply

## 2023-10-04 ENCOUNTER — Ambulatory Visit (INDEPENDENT_AMBULATORY_CARE_PROVIDER_SITE_OTHER): Admitting: Student

## 2023-10-04 VITALS — BP 115/80 | HR 88 | Temp 98.3°F | Wt 193.2 lb

## 2023-10-04 DIAGNOSIS — R202 Paresthesia of skin: Secondary | ICD-10-CM | POA: Diagnosis not present

## 2023-10-04 LAB — POCT SKIN KOH: Skin KOH, POC: NEGATIVE

## 2023-10-04 MED ORDER — BETAMETHASONE VALERATE 0.1 % EX OINT: 1.0000 | TOPICAL_OINTMENT | Freq: Two times a day (BID) | CUTANEOUS | 0 refills | Status: DC

## 2023-10-04 NOTE — Progress Notes (Addendum)
    SUBJECTIVE:   CHIEF COMPLAINT / HPI: Itchy rash  The patient presents with a month-long history of an itchy rash on her lower back, just above the buttocks.  Has been having persistent itching, and she sought medical attention.  She has attempted self-treatment with over-the-counter hydrocortisone 1% and leftover triamcinolone from her granddaughter, which provided temporary relief for about three days before the itching returned.  The patient denies any changes in detergents or soaps, typically using Dove antibacterial soap, but occasionally using Nivea.  The rash is localized to the lower back and does not extend to the anus.  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 KOH negative. -Trial valisone BID - 2 week follow up     Levin Erp, MD [facility]

## 2023-10-04 NOTE — Patient Instructions (Signed)
 It was great to see you! Thank you for allowing me to participate in your care!   Our plans for today:  - This looks like notalgia paresthetica  - I am sending in a steroid cream, the skin scrapings were negative for a fungal infection  Take care and seek immediate care sooner if you develop any concerns.  Levin Erp, MD

## 2023-10-15 ENCOUNTER — Other Ambulatory Visit: Payer: Self-pay

## 2023-10-15 MED ORDER — HYDROCHLOROTHIAZIDE 25 MG PO TABS: 25.0000 mg | ORAL_TABLET | Freq: Every day | ORAL | 1 refills | Status: DC

## 2023-10-15 MED ORDER — TAMSULOSIN HCL 0.4 MG PO CAPS: 0.4000 mg | ORAL_CAPSULE | Freq: Every day | ORAL | 1 refills | Status: DC

## 2023-12-14 DIAGNOSIS — M7661 Achilles tendinitis, right leg: Secondary | ICD-10-CM | POA: Diagnosis not present

## 2024-01-15 ENCOUNTER — Other Ambulatory Visit: Payer: Self-pay | Admitting: Family Medicine

## 2024-01-15 ENCOUNTER — Encounter: Payer: Self-pay | Admitting: Family Medicine

## 2024-01-15 ENCOUNTER — Ambulatory Visit (INDEPENDENT_AMBULATORY_CARE_PROVIDER_SITE_OTHER): Admitting: Family Medicine

## 2024-01-15 VITALS — BP 118/65 | HR 83 | Ht 63.0 in | Wt 193.1 lb

## 2024-01-15 DIAGNOSIS — R7303 Prediabetes: Secondary | ICD-10-CM | POA: Diagnosis not present

## 2024-01-15 DIAGNOSIS — R202 Paresthesia of skin: Secondary | ICD-10-CM

## 2024-01-15 DIAGNOSIS — R270 Ataxia, unspecified: Secondary | ICD-10-CM | POA: Diagnosis not present

## 2024-01-15 DIAGNOSIS — I10 Essential (primary) hypertension: Secondary | ICD-10-CM

## 2024-01-15 DIAGNOSIS — Z6834 Body mass index (BMI) 34.0-34.9, adult: Secondary | ICD-10-CM | POA: Diagnosis not present

## 2024-01-15 DIAGNOSIS — F8081 Childhood onset fluency disorder: Secondary | ICD-10-CM

## 2024-01-15 DIAGNOSIS — E2839 Other primary ovarian failure: Secondary | ICD-10-CM | POA: Diagnosis not present

## 2024-01-15 DIAGNOSIS — R413 Other amnesia: Secondary | ICD-10-CM

## 2024-01-15 MED ORDER — BETAMETHASONE VALERATE 0.1 % EX OINT: 1.0000 | TOPICAL_OINTMENT | Freq: Two times a day (BID) | CUTANEOUS | 1 refills | Status: AC

## 2024-01-15 MED ORDER — OZEMPIC (2 MG/DOSE) 8 MG/3ML ~~LOC~~ SOPN: 2.0000 mg | PEN_INJECTOR | SUBCUTANEOUS | Status: DC

## 2024-01-15 MED ORDER — OZEMPIC (2 MG/DOSE) 8 MG/3ML ~~LOC~~ SOPN: 2.0000 mg | PEN_INJECTOR | SUBCUTANEOUS | 1 refills | Status: DC

## 2024-01-15 NOTE — Patient Instructions (Signed)
 Paresthesia Paresthesia is a burning or prickling feeling. This feeling can happen in any part of the body. It often happens in the hands, arms, legs, or feet. Usually, it is not painful. In most cases, the feeling goes away in a short time and is not a sign of a serious problem. If you have paresthesia that lasts a long time, you need to see your doctor. Follow these instructions at home: Nutrition Eat a healthy diet. This includes: Eating foods that are high in fiber. These include beans, whole grains, and fresh fruits and vegetables. Limiting foods that are high in fat and sugar. These include fried or sweet foods.  Alcohol use  Do not drink alcohol if: Your doctor tells you not to drink. You are pregnant, may be pregnant, or are planning to become pregnant. If you drink alcohol: Limit how much you have to: 0-1 drink a day for women. 0-2 drinks a day for men. Know how much alcohol is in your drink. In the [place] one drink equals one 12 oz bottle of beer (355 mL), one 5 oz glass of wine (148 mL), or one 1 oz glass of hard liquor (44 mL). General instructions Take over-the-counter and prescription medicines only as told by your doctor. Do not smoke or use any products that contain nicotine or tobacco. If you need help quitting, ask your doctor. If you have diabetes, work with your doctor to make sure your blood sugar stays in a healthy range. If your feet feel numb: Check for redness, warmth, and swelling every day. Wear padded socks and comfortable shoes. These help protect your feet. Keep all follow-up visits. Contact a doctor if: You have paresthesia that gets worse or does not go away. You lose feeling (have numbness) after an injury. Your burning or prickling feeling gets worse when you walk. You have pain or cramps. You feel dizzy or you faint. You have a rash. Get help right away if: You feel weak or have new weakness in an arm or leg. You have trouble walking or  moving. You have problems speaking, understanding, or seeing. You feel confused. You cannot control when you pee (urinate) or poop (have a bowel movement). These symptoms may be an emergency. Get help right away. Call 911. Do not wait to see if the symptoms will go away. Do not drive yourself to the hospital. Summary Paresthesia is a burning or prickling feeling. It often happens in the hands, arms, legs, or feet. In most cases, the feeling goes away in a short time and is not a sign of a serious problem. If you have paresthesia that lasts a long time, you need to be seen by your doctor. This information is not intended to replace advice given to you by your health care provider. Make sure you discuss any questions you have with your health care provider. Document Revised: 03/21/2021 Document Reviewed: 03/21/2021 Elsevier Patient Education  2024 [employer].

## 2024-01-15 NOTE — Assessment & Plan Note (Addendum)
Increase Ozempic to 2 mg weekly. 

## 2024-01-15 NOTE — Addendum Note (Signed)
 Addended by: ANDERS CUMINS T on: 01/15/2024 04:58 PM   Modules accepted: Orders

## 2024-01-15 NOTE — Progress Notes (Signed)
    SUBJECTIVE:   CHIEF COMPLAINT / HPI:   Weight management/PreDM: The patient is present to follow up on her Ozempic  management. She is compliant with Wegovy  1 mg weekly. Home CBG looks great. However, she does not seem to be losing additional weight. She does not get a lot of exercise and eats more carbs diet.   Finger pain: Here to follow up with the tingling sensation of her fingers, which started on her right 3-5th finger but has now spread to her left fingers and toes. Feels numb sometimes. No limb weakness  Memory Issue: Symptoms worsening. Now associated with stuttering  HTN: Here to f/u  PERTINENT  PMH / PSH: PMHx reviewed  OBJECTIVE:   BP 118/65   Pulse 83   Ht 5' 3 (1.6 m)   Wt 193 lb 2 oz (87.6 kg)   SpO2 99%   BMI 34.21 kg/m   Physical Exam Vitals and nursing note reviewed.   Cardiovascular:     Rate and Rhythm: Normal rate and regular rhythm.     Heart sounds: Normal heart sounds. No murmur heard. Pulmonary:     Effort: Pulmonary effort is normal. No respiratory distress.     Breath sounds: Normal breath sounds. No wheezing.  Abdominal:     General: Abdomen is flat. There is no distension.     Palpations: There is no mass.     Tenderness: There is no abdominal tenderness.   Musculoskeletal:     Right lower leg: No edema.     Left lower leg: No edema.   Neurological:     General: No focal deficit present.     Mental Status: She is oriented to person, place, and time.     Cranial Nerves: No cranial nerve deficit.     Sensory: No sensory deficit.     Motor: No weakness.     Coordination: Coordination normal.     Gait: Gait normal.     Deep Tendon Reflexes: Reflexes normal.      ASSESSMENT/PLAN:   Assessment & Plan Notalgia paresthetica Topical steroid cream refilled - effective per patient Essential hypertension Cmet and FLP checked Will contact her soon with her test results Paresthesia Recent lab work including HIV, RPR, TSH, B12 were  normal Thiamine checked today Referral placed to neurology for NSC/EMG She agreed with the plan Hypoestrogenism Dexa scan ordered Wanted to know if she can d/c her calcium /Vit D supplements F/U Ca2+ level and dexa scan report before d/cing supplements She agreed with the plan Pre-diabetes Increase Ozempic  to 2 mg weekly BMI 34.0-34.9,adult Increase Ozempic  to 2 mg weekly Memory loss Thiamine checked We discussed getting an MRI of her brain now However, due to her having metal plates in her body, she opted to wait till she see's the neurologist Referred to Neurology Stuttering We discussed getting an MRI of her brain now However, due to her having metal plates in her body, she opted to wait till she see's the neurologist Referred to Neurology   Addendum: Based on MyChart messaging, patient now want imaging done.  In addition to memory loss and stuttering, she also endorsed gait instability. As discussed, given her metal plates, I will prefer to start work up with CT scan rather than MRI. We will obtain CT head pending neurology eval. She agreed with the plan. CT head ordered  Otto Fairly, MD [facility]

## 2024-01-15 NOTE — Assessment & Plan Note (Signed)
 Recent lab work including HIV, RPR, TSH, B12 were normal Thiamine checked today Referral placed to neurology for NSC/EMG She agreed with the plan

## 2024-01-16 ENCOUNTER — Other Ambulatory Visit (HOSPITAL_COMMUNITY): Payer: Self-pay

## 2024-01-16 ENCOUNTER — Ambulatory Visit: Payer: Self-pay | Admitting: Family Medicine

## 2024-01-16 LAB — VITAMIN B1

## 2024-01-17 NOTE — Addendum Note (Signed)
 Addended by: ANDERS CUMINS T on: 01/17/2024 08:18 AM   Modules accepted: Orders

## 2024-01-17 NOTE — Progress Notes (Signed)
 Patient is scheduled for CT 01/21/2024 @330  at [facility]

## 2024-01-18 ENCOUNTER — Other Ambulatory Visit: Payer: Self-pay | Admitting: Family Medicine

## 2024-01-19 LAB — CMP14+EGFR
ALT: 11 IU/L (ref 0–32)
AST: 20 IU/L (ref 0–40)
Albumin: 4.5 g/dL (ref 3.8–4.8)
Alkaline Phosphatase: 79 IU/L (ref 44–121)
BUN/Creatinine Ratio: 22 (ref 12–28)
BUN: 16 mg/dL (ref 8–27)
Bilirubin Total: 0.8 mg/dL (ref 0.0–1.2)
CO2: 26 mmol/L (ref 20–29)
Calcium: 10.3 mg/dL (ref 8.7–10.3)
Chloride: 101 mmol/L (ref 96–106)
Creatinine, Ser: 0.72 mg/dL (ref 0.57–1.00)
Globulin, Total: 2 g/dL (ref 1.5–4.5)
Glucose: 86 mg/dL (ref 70–99)
Potassium: 4 mmol/L (ref 3.5–5.2)
Sodium: 142 mmol/L (ref 134–144)
Total Protein: 6.5 g/dL (ref 6.0–8.5)
eGFR: 87 mL/min/{1.73_m2} (ref >59)

## 2024-01-19 LAB — LIPID PANEL
Chol/HDL Ratio: 2 ratio (ref 0.0–4.4)
Cholesterol, Total: 147 mg/dL (ref 100–199)
HDL: 72 mg/dL (ref >39)
LDL Chol Calc (NIH): 62 mg/dL (ref 0–99)
Triglycerides: 67 mg/dL (ref 0–149)
VLDL Cholesterol Cal: 13 mg/dL (ref 5–40)

## 2024-01-21 ENCOUNTER — Ambulatory Visit (HOSPITAL_BASED_OUTPATIENT_CLINIC_OR_DEPARTMENT_OTHER)
Admission: RE | Admit: 2024-01-21 | Discharge: 2024-01-21 | Disposition: A | Discharge status: Home / Self Care | Source: Ambulatory Visit | Attending: Family Medicine | Admitting: Family Medicine

## 2024-01-21 ENCOUNTER — Telehealth: Payer: Self-pay

## 2024-01-21 DIAGNOSIS — F8081 Childhood onset fluency disorder: Secondary | ICD-10-CM | POA: Diagnosis not present

## 2024-01-21 DIAGNOSIS — R270 Ataxia, unspecified: Secondary | ICD-10-CM | POA: Insufficient documentation

## 2024-01-21 DIAGNOSIS — R413 Other amnesia: Secondary | ICD-10-CM | POA: Insufficient documentation

## 2024-01-21 DIAGNOSIS — R29818 Other symptoms and signs involving the nervous system: Secondary | ICD-10-CM | POA: Diagnosis not present

## 2024-01-21 NOTE — Telephone Encounter (Signed)
 Received call from [facility] regarding results of CT Head.   See below.   IMPRESSION: 1. Subtle 0.9 cm hyperdense region within the left uncus, without associated mass effect, of uncertain etiology. While this could reflect volume averaging, further evaluation with MRI with and without contrast is recommended to exclude underlying mass. 2. No evidence of acute infarct or hemorrhage.  Forwarding to Dr. Anders.   Chiquita JAYSON English, RN

## 2024-01-21 NOTE — Telephone Encounter (Signed)
 I called and discussed the CT head report with Dr. Ozell Daring. Subtle 0.9 cm hyperdense region within the left uncus, without associated mass effect. He suggested this could be volume averaging vs meningioma. However, it is recommended vs. CT with or without contrast. The patient has a metal plate, and I am uncertain if these plates are compatible with MRI or not. Per radiology, there is no urgency in ordering this test as there is no brain mass effect. I called the patient and discussed the report with her. She has a metal plate in her right shoulder, right hip left knee, and back. She has not had an MRI done since all of these surgeries. She received a call from neurology to schedule a referral appointment but advised them she would call back to schedule. She denies worsening of her symptoms.   Plan discussed with her: 1. Call the neurology office to schedule an assessment of her symptoms. I will send her contact information to her MyChart account, as she does not have a pen at this time.   2. Call the orthopedic office and obtain a letter confirming that all metal plates insitu are compatible with MRI. She will bring this letter to our office for review.  3. Return to the ED for assessment if symptoms worsen  She agreed with the plan and verbalized understanding of the conversation.

## 2024-02-02 NOTE — Progress Notes (Addendum)
 Assessment/Plan:     Angie [name] is a very pleasant 78 y.o. year old RH female with a history of hypertension, hyperlipidemia, prediabetes on Ozempic , recent notalgia paresthetica on her back, now resolved, seen today for evaluation of memory loss. MoCA today is 25/30 .  Etiology is unclear, workup is in progress.  Patient is able to participate on ADLs , mood is good.    Memory Impairment of unclear etiology     Neurocognitive testing to further evaluate cognitive concerns and determine other underlying cause of memory changes, including potential contribution from sleep, anxiety, attention, or depression among others   Check B12, TSH Will consider ACHI pending on the neurocognitive testing NCS/EMG for paresthesias, possible neuropathy (prediabetes)  referral to speech therapy, for speech difficulties Recommend good control of cardiovascular risk factors.   Continue to control mood as per PCP Folllow up after neuropsych evaluation   Subjective:    The patient is here alone    How long did patient have memory difficulties?  For about 2 or 3 months .  She reports that she may forget what she is saying to me of a conversation.  She knows the word that she cannot retrieve them.  Cannot get them out .  I feel less fluent she patient reports some difficulty remembering new information, recent conversations, names.  If I am in the computer, I have to focus on what I am pressing .  She still able to recognize members of her family and other long-term issues.  She enjoys making quilts, sewing and she is able to remember all the steps. repeats oneself?  Endorsed Disoriented when walking into a room? Denies    Leaving objects in unusual places?  Denies.   Wandering behavior? Denies.   Any personality changes, or depression, anxiety? Denies   Hallucinations or paranoia? Denies.   Seizures? Denies.    Any sleep changes?  Sleeps well, denies frequent nightmares or dream  reenactment, other REM behavior or sleepwalking   Sleep apnea? Denies.   Any hygiene concerns?  Denies.   Independent of bathing and dressing? Endorsed  Does the patient need help with medications?  Patient's is in charge   Who is in charge of the finances?  Patient's is in charge     Any changes in appetite?   Denies.  Last year I was on Ozempic  and lost 25 pounds, now I am no longer on that .    Patient have trouble swallowing?  Except for large pills, she denies any trouble swallowing. Does the patient cook? Yes, denies forgetting common recipes or kitchen accidents   Any headaches?  Denies.   Chronic pain? Denies.   Ambulates with difficulty?  She reports gait instability, leaning to the left, I am walking cautiously .  I do not like going to the gym but sometimes ago. Recent falls or head injuries? Denies.     Vision changes?  Denies any new issues.    Any strokelike symptoms? Denies. Any paresthesias?  Endorsed, she reports tingling sensations on her fingers, flaming at times ,  spread to the toes, accompanied sometimes by mild numbness, no extremity weakness. Any tremors? Denies.   Any anosmia? Denies.   Any incontinence of urine? Residual urine, takes Flomax  Any bowel dysfunction? Denies.      History of heavy alcohol  intake? Denies.   History of heavy tobacco use? Denies. Patient lives with her husband Family history of dementia?  Denies . Mo dies with strokex2  Does patient drive?   yes, denies getting lost.   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   No Known Allergies  Current Outpatient Medications  Medication Instructions   acetaminophen  (TYLENOL ) 500 mg, Every 6 hours PRN   betamethasone  valerate ointment (VALISONE ) 0.1 % 1 Application, Topical, 2 times daily, Apply to back lesion   cycloSPORINE  (RESTASIS ) 0.05 % ophthalmic emulsion 1 drop, 2 times daily   diclofenac  Sodium (VOLTAREN ) 2 g, Topical, 2 times daily PRN   famotidine  (PEPCID ) 40 mg, Oral, Daily PRN   hydrochlorothiazide  (HYDRODIURIL ) 25 mg, Oral, Daily   levocetirizine (XYZAL ) 5 mg, Oral, Every evening   melatonin 3 mg, At bedtime PRN   mometasone  (NASONEX ) 50 MCG/ACT nasal spray 2 sprays, Nasal, Daily   Multiple Minerals-Vitamins (CAL MAG ZINC +D3) TABS 2 tablets, Daily   Multiple Vitamin (MULTIVITAMIN) capsule 1 capsule, See admin instructions   Multiple Vitamins-Minerals (OCUVITE PO) 1 tablet, See admin instructions   omeprazole (PRILOSEC) 40 mg, 2 times daily   Ozempic  (2 MG/DOSE) 2 mg, Subcutaneous, Weekly   pregabalin  (LYRICA ) 150 mg, Oral, 2 times daily   rosuvastatin  (CRESTOR ) 10 mg, Oral, Daily   senna-docusate (SENOKOT-S) 8.6-50 MG tablet 1 tablet, Daily PRN   SUMAtriptan  (IMITREX ) 25 MG tablet Take 1 tab for Migraine headache. May repeat dose 2-3 hrs later if there is no improvement, not more than 100 mg per day.   tamsulosin  (FLOMAX ) 0.4 mg, Oral, Daily   tiZANidine  (ZANAFLEX ) 4 mg, Oral, At bedtime PRN   traMADol  (ULTRAM ) 50-100 mg, Oral, Every 6 hours PRN   valACYclovir  (VALTREX ) 500 mg, Oral, Daily      VITALS:   Vitals:   02/04/24 0742  BP: 124/76  Pulse: 80  Resp: 20  SpO2: 97%  Weight: 194 lb (88 kg)  Height: 5' 3 (1.6 m)     Physical Exam  :    02/04/2024    5:00 PM  Montreal Cognitive Assessment   Visuospatial/ Executive (0/5) 3  Naming (0/3) 3  Attention: Read list of digits (0/2) 2  Attention: Read list  of letters (0/1) 1  Attention: Serial 7 subtraction starting at 100 (0/3) 0  Language: Repeat phrase (0/2) 2  Language : Fluency (0/1) 1  Abstraction (0/2) 2  Delayed Recall (0/5) 5  Orientation (0/6) 6  Total 25  Adjusted Score (based on education) 25        No data to display             HEENT:  Normocephalic, atraumatic.  The superficial temporal arteries are without ropiness or tenderness. Cardiovascular: Regular rate and rhythm. Lungs: Clear to auscultation bilaterally, 1/6 SM. Neck: There are no carotid bruits noted bilaterally. Orientation:  Alert and oriented to person, place and not to time . No aphasia or dysarthria. Fund of knowledge is appropriate. Recent and remote memory normal.  Attention and concentration are reduced.  Able to name objects and repeat phrases.  Delayed recall 5 /5 . Cranial  nerves: There is good facial symmetry. Extraocular muscles are intact and visual fields are full to confrontational testing. Speech is fluent and clear today. No tongue deviation. Hearing is intact to conversational tone.  Tone: Tone is good throughout. Sensation: Sensation is intact to light touch.  Vibration is intact at the bilateral big toe.  Coordination: The patient has no difficulty with RAM's or FNF bilaterally. Normal finger to nose  Motor: Strength is 5/5 in the bilateral upper and lower extremities. There is no pronator drift. There are no fasciculations noted. DTR's: Deep tendon reflexes are 2/4 bilaterally. Gait and Station: The patient is able to ambulate without difficulty. Gait is cautious and narrow. Stride length is normal.        Thank you for allowing us  the opportunity to participate in the care of this nice patient. Please do not hesitate to contact us  for any questions or concerns.   Total time spent on today's visit was 50 minutes dedicated to this patient today, preparing to see patient, examining the patient, ordering tests and/or medications and  counseling the patient, documenting clinical information in the EHR or other health record, independently interpreting results and communicating results to the patient/family, discussing treatment and goals, answering patient's questions and coordinating care.  Cc:  Anders Otto DASEN, MD  Camie Sevin 02/04/2024 5:17 PM

## 2024-02-04 ENCOUNTER — Encounter: Payer: Self-pay | Admitting: Physician Assistant

## 2024-02-04 ENCOUNTER — Encounter

## 2024-02-04 ENCOUNTER — Ambulatory Visit (INDEPENDENT_AMBULATORY_CARE_PROVIDER_SITE_OTHER): Payer: Self-pay | Admitting: Physician Assistant

## 2024-02-04 ENCOUNTER — Other Ambulatory Visit

## 2024-02-04 VITALS — BP 124/76 | HR 80 | Resp 20 | Ht 63.0 in | Wt 194.0 lb

## 2024-02-04 DIAGNOSIS — R413 Other amnesia: Secondary | ICD-10-CM | POA: Insufficient documentation

## 2024-02-04 LAB — VITAMIN B12: Vitamin B-12: 471 pg/mL (ref 200–1100)

## 2024-02-04 LAB — TSH: TSH: 2.41 m[IU]/L (ref 0.40–4.50)

## 2024-02-05 ENCOUNTER — Ambulatory Visit: Payer: Self-pay | Admitting: Physician Assistant

## 2024-02-07 NOTE — Telephone Encounter (Signed)
 Made telephone visit for 7/29 at 11:10. Nelson Land, CMA

## 2024-02-19 ENCOUNTER — Ambulatory Visit: Admitting: Family Medicine

## 2024-02-19 ENCOUNTER — Encounter: Payer: Self-pay | Admitting: Family Medicine

## 2024-02-19 DIAGNOSIS — G939 Disorder of brain, unspecified: Secondary | ICD-10-CM | POA: Diagnosis not present

## 2024-02-19 DIAGNOSIS — J069 Acute upper respiratory infection, unspecified: Secondary | ICD-10-CM | POA: Diagnosis not present

## 2024-02-19 MED ORDER — PROMETHAZINE-DM 6.25-15 MG/5ML PO SYRP: 5.0000 mL | ORAL_SOLUTION | Freq: Two times a day (BID) | ORAL | 0 refills | Status: DC | PRN

## 2024-02-19 NOTE — Patient Instructions (Signed)

## 2024-02-19 NOTE — Progress Notes (Unsigned)
 [facility] Telemedicine Visit  Patient consented to have virtual visit and was identified by name and date of birth. Method of visit: Telephone  Encounter participants: Patient: Angie [name] - located at Home Provider: Otto [name] - located at [facility] Office Others (if applicable): N/A  Chief Complaint: Cough/ F/U MRI brain request  HPI:  Cough:  Patient c/o cough x 2 weeks. She uses Nyquil with no improvement. Cough is productive of a yellow sputum with no blood stain. She denies fever, chest pain, or SOB.   Memory loss/Brain lesion: No change from her baseline. She has yet to obtain MRI clearance from Dr. Rebekah office since he retired. No other neurologic complaint today.    ROS: per HPI  Pertinent PMHx: PMHx reviewed  Exam:  There were no vitals taken for this visit.  Respiratory: She could complete full sentences without difficulty  Assessment/Plan:  Brain lesion MRI of the brain is recommended to evaluate her brain lesion further Unfortunately, we have not been able to obtain MRI clearance from neurosurgery - Dr. Unice, due to metal in her spine. I reviewed her surgery note from 2020 She has Pedicle screws, Cortical Nuvasive screws, and some unnamed rods. Some pedicle and nuvasic screws are MRI compatible, while others are not or have conditional compatibility. I am also unable to identify the specific type of rod inserted, as this was not documented in the surgical note. I encouraged the patient to obtain an MRI clearance letter from Dr. Rebekah partner at  [facility]. Of note, this surgery was assisted by Dr. Gerldine Maizes, and I have messaged the patient to specifically request clearance from him if possible.   More than 50% of this 35 minutes encounter was spend of record review and coordination of care    URI:  Likely viral Promethazine -DM escribed F/U soon if there is no improvement ED  precautions advised F/U appointment made  Time spent during visit with patient: 15 minutes

## 2024-02-20 DIAGNOSIS — G939 Disorder of brain, unspecified: Secondary | ICD-10-CM | POA: Insufficient documentation

## 2024-02-20 NOTE — Assessment & Plan Note (Signed)
 MRI of the brain is recommended to evaluate her brain lesion further Unfortunately, we have not been able to obtain MRI clearance from neurosurgery - Dr. Unice, due to metal in her spine. I reviewed her surgery note from 2020 She has Pedicle screws, Cortical Nuvasive screws, and some unnamed rods. Some pedicle and nuvasic screws are MRI compatible, while others are not or have conditional compatibility. I am also unable to identify the specific type of rod inserted, as this was not documented in the surgical note. I encouraged the patient to obtain an MRI clearance letter from Dr. Rebekah partner at  [facility]. Of note, this surgery was assisted by Dr. Gerldine Maizes, and I have messaged the patient to specifically request clearance from him if possible.   More than 50% of this 35 minutes encounter was spend of record review and coordination of care

## 2024-02-25 ENCOUNTER — Encounter: Payer: Self-pay | Admitting: Family Medicine

## 2024-02-25 ENCOUNTER — Ambulatory Visit (INDEPENDENT_AMBULATORY_CARE_PROVIDER_SITE_OTHER): Admitting: Family Medicine

## 2024-02-25 VITALS — BP 131/68 | HR 75 | Ht 63.0 in | Wt 196.5 lb

## 2024-02-25 DIAGNOSIS — Z6834 Body mass index (BMI) 34.0-34.9, adult: Secondary | ICD-10-CM

## 2024-02-25 DIAGNOSIS — R7303 Prediabetes: Secondary | ICD-10-CM

## 2024-02-25 DIAGNOSIS — J209 Acute bronchitis, unspecified: Secondary | ICD-10-CM

## 2024-02-25 DIAGNOSIS — I10 Essential (primary) hypertension: Secondary | ICD-10-CM

## 2024-02-25 DIAGNOSIS — G939 Disorder of brain, unspecified: Secondary | ICD-10-CM | POA: Diagnosis not present

## 2024-02-25 DIAGNOSIS — J440 Chronic obstructive pulmonary disease with acute lower respiratory infection: Secondary | ICD-10-CM

## 2024-02-25 DIAGNOSIS — R413 Other amnesia: Secondary | ICD-10-CM | POA: Diagnosis not present

## 2024-02-25 LAB — POCT GLYCOSYLATED HEMOGLOBIN (HGB A1C): Hemoglobin A1C: 5.3 % (ref 4.0–5.6)

## 2024-02-25 MED ORDER — AZITHROMYCIN 250 MG PO TABS: ORAL_TABLET | ORAL | 0 refills | Status: DC

## 2024-02-25 NOTE — Patient Instructions (Signed)
 Acute Bronchitis, Adult  Acute bronchitis is when air tubes in the lungs (bronchi) suddenly get swollen. The condition can make it hard for you to breathe. In adults, acute bronchitis usually goes away within 2 weeks. A cough caused by bronchitis may last up to 3 weeks. Smoking, allergies, and asthma can make the condition worse. What are the causes? Germs that cause cold and flu (viruses). The most common cause of this condition is the virus that causes the common cold. Bacteria. Substances that bother (irritate) the lungs, including: Smoke from cigarettes and other types of tobacco. Dust and pollen. Fumes from chemicals, gases, or burned fuel. Indoor or outdoor air pollution. What increases the risk? A weak body's defense system. This is also called the immune system. Any condition that affects your lungs and breathing, such as asthma. What are the signs or symptoms? A cough. Coughing up clear, yellow, or green mucus. Making high-pitched whistling sounds when you breathe, most often when you breathe out (wheezing). Runny or stuffy nose. Having too much mucus in your lungs (chest congestion). Shortness of breath. Body aches. A sore throat. How is this treated? Acute bronchitis may go away over time without treatment. Your doctor may tell you to: Drink more fluids. This will help thin your mucus so it is easier to cough up. Use a device that gets medicine into your lungs (inhaler). Use a vaporizer or a humidifier. These are machines that add water to the air. This helps with coughing and poor breathing. Take a medicine that thins mucus and helps clear it from your lungs. Take a medicine that prevents or stops coughing. It is not common to take an antibiotic medicine for this condition. Follow these instructions at home:  Take over-the-counter and prescription medicines only as told by your doctor. Use an inhaler, vaporizer, or humidifier as told by your doctor. Take two teaspoons  (10 mL) of honey at bedtime. This helps lessen your coughing at night. Drink enough fluid to keep your pee (urine) pale yellow. Do not smoke or use any products that contain nicotine or tobacco. If you need help quitting, ask your doctor. Get a lot of rest. Return to your normal activities when your doctor says that it is safe. Keep all follow-up visits. How is this prevented?  Wash your hands often with soap and water for at least 20 seconds. If you cannot use soap and water, use hand sanitizer. Avoid contact with people who have cold symptoms. Try not to touch your mouth, nose, or eyes with your hands. Avoid breathing in smoke or chemical fumes. Make sure to get the flu shot every year. Contact a doctor if: Your symptoms do not get better in 2 weeks. You have trouble coughing up the mucus. Your cough keeps you awake at night. You have a fever. Get help right away if: You cough up blood. You have chest pain. You have very bad shortness of breath. You faint or keep feeling like you are going to faint. You have a very bad headache. Your fever or chills get worse. These symptoms may be an emergency. Get help right away. Call your local emergency services (911 in the [place] Do not wait to see if the symptoms will go away. Do not drive yourself to the hospital. Summary Acute bronchitis is when air tubes in the lungs (bronchi) suddenly get swollen. In adults, acute bronchitis usually goes away within 2 weeks. Drink more fluids. This will help thin your mucus so it is easier  to cough up. Take over-the-counter and prescription medicines only as told by your doctor. Contact a doctor if your symptoms do not improve after 2 weeks of treatment. This information is not intended to replace advice given to you by your health care provider. Make sure you discuss any questions you have with your health care provider. Document Revised: 11/10/2020 Document Reviewed: 11/10/2020 Elsevier Patient  Education  2024 [employer].

## 2024-02-25 NOTE — Assessment & Plan Note (Signed)
 Hypertension well-controlled with current medication regimen. - Renew all current hypertension medications through mail-order pharmacy.

## 2024-02-25 NOTE — Assessment & Plan Note (Signed)
 Type 2 diabetes mellitus with recent discontinuation of Ozempic  for six weeks. Diet and exercise adherence is suboptimal. Weight increased by three pounds. A1c stable at 5.3%. - Monitor A1c levels. - Discuss potential reinitiation of Ozempic  if A1c levels increase. - Encourage regular exercise and dietary control.

## 2024-02-25 NOTE — Assessment & Plan Note (Signed)
 Obesity with recent weight gain of three pounds. Exercise and dietary habits are suboptimal. - Encourage regular exercise and dietary modifications to aid in weight management.

## 2024-02-25 NOTE — Assessment & Plan Note (Signed)
 Memory impairment with CT scan showing a 0.8 by 0.9 cm lesion, possibly scarring or volume averaging. No stroke or hemorrhage. MRI needed for further evaluation. Neurosurgeon clearance pending due to metal screw. - Obtain clearance from neurosurgeon for MRI. - Proceed with MRI of the brain once clearance is obtained.

## 2024-02-25 NOTE — Progress Notes (Signed)
    History of Present Illness Angie [name] is a 78 year old female with diabetes and memory impairment who presents for follow-up and medication renewal.  She has been off Ozempic  for about six weeks, leading to an increase in appetite and a weight gain of approximately three pounds, now weighing 196 pounds. Her diet and exercise regimen has not been going well, with inconsistent participation in cardio dance and step aerobics classes due to knee issues. Her A1c was checked today and is 5.3.  She continues to experience memory impairment. A previous CT scan of the brain revealed a 0.8 by 0.9 lesion, described as possible scarring. An MRI is recommended for further evaluation, but she is awaiting confirmation about the MRI compatibility of a metal screw in her body from a neurosurgeon.  She has had a persistent cough for over three weeks, which worsens at night. Initially, prescribed cough medicine provided relief, but it is no longer effective. She produces thick, yellow sputum and uses a toothbrush to help expel it. No fever at home. Her breathing is fine except at night when the coughing worsens.  Medications - Blood pressure medicine  Physical Exam MEASUREMENTS: Weight- 196. CHEST: Lungs clear to auscultation. CARDIOVASCULAR: Heart with murmur, otherwise normal. EXTREMITIES: Legs with puffiness, no pitting edema. NEUROLOGICAL: Sensation intact. Motor function normal. Reflexes normal.  Results LABS A1c: 5.3 (02/25/2024)  RADIOLOGY Brain CT: No evidence of stroke or hemorrhage, 0.8 x 0.9 cm lesion, possible scarring  Assessment and Plan Pre-diabetes mellitus Type 2 diabetes mellitus with recent discontinuation of Ozempic  for six weeks. Diet and exercise adherence is suboptimal. Weight increased by three pounds. A1c stable at 5.3%. - Monitor A1c levels. - Discuss potential reinitiation of Ozempic  if A1c levels increase. - Encourage regular exercise and dietary  control.  Sub-acute bronchitis with cough Yellow sputum, worse at night. Previous cough medicine provided temporary relief. Zithromax  prescribed for possible bronchitis discussed. - Prescribe Zithromax  (azithromycin ) with dosing of two tablets on the first day, followed by one tablet daily for four days. - Consider chest x-ray if no improvement in symptoms.  Memory impairment with abnormal brain imaging under evaluation Memory impairment with CT scan showing a 0.8 by 0.9 cm lesion, possibly scarring or volume averaging. No stroke or hemorrhage. MRI needed for further evaluation. Neurosurgeon clearance pending due to metal screw. - Obtain clearance from neurosurgeon for MRI. - Proceed with MRI of the brain once clearance is obtained.  Hypertension Hypertension well-controlled with current medication regimen. - Renew all current hypertension medications through mail-order pharmacy.  Obesity Obesity with recent weight gain of three pounds. Exercise and dietary habits are suboptimal. - Encourage regular exercise and dietary modifications to aid in weight management.   Otto Fairly, MD [facility]

## 2024-03-11 ENCOUNTER — Telehealth: Payer: Self-pay | Admitting: Family Medicine

## 2024-03-11 NOTE — Telephone Encounter (Signed)
 Called patient to clarify what she wanted to discuss.  Patient stated that she was having head problems and when they did the CT, she stated when they did a CT they saw something on her brain. Patient stated that you guys already discussed this before and also stated that the new surgeon that replaced her old one will not sign off for clearance.

## 2024-03-11 NOTE — Telephone Encounter (Signed)
 Patient came in asking to talk to Dr.Eniola about surgrey/head problems. No wanting to book an appointment.  Thank you!

## 2024-03-11 NOTE — Telephone Encounter (Signed)
 I called and spoke with [name]. Per the patient, Dr. Rebekah partner would not clear her for an MRI, although he assisted in performing the procedure. At this point, I suggested she discuss with neurology regarding the next course of action. She has an upcoming neurology appointment on 04/01/24. Red flag symptoms and indications for an ED visit were discussed. We will plan to repeat the CT head about 6 months from her last test, depending on the neurologist's recommendation. She agreed with the plan and verbalized understanding.

## 2024-03-13 ENCOUNTER — Ambulatory Visit (INDEPENDENT_AMBULATORY_CARE_PROVIDER_SITE_OTHER): Admitting: Neurology

## 2024-03-13 DIAGNOSIS — G5621 Lesion of ulnar nerve, right upper limb: Secondary | ICD-10-CM

## 2024-03-13 DIAGNOSIS — R413 Other amnesia: Secondary | ICD-10-CM | POA: Diagnosis not present

## 2024-03-13 DIAGNOSIS — G5601 Carpal tunnel syndrome, right upper limb: Secondary | ICD-10-CM

## 2024-03-13 NOTE — Procedures (Signed)
 [facility]  [address], [address]  [place], [place] [address] Tel: [phone] Fax: [phone] Test Date:  03/13/2024  Patient: Angie [name] DOB: [birth year] Physician: Tonita Blanch, DO  Sex: Female Height: 5' 3 Ref Phys: Camie Sevin, PA-C  ID#: [id number]   Technician:    History: This is a 78 year old female referred for evaluation of paresthesias of the hands and feet.  NCV & EMG Findings: Extensive electrodiagnostic testing of the right upper and lower extremity shows:  Right median sensory response shows prolonged latency (3.9 ms) and reduced amplitude (9.6 V).  Right ulnar, sural, and superficial peroneal sensory responses are within normal limits. Right ulnar motor response shows slowed conduction velocity across the elbow (A Elbow-B Elbow, 43 m/s).  Right median, peroneal, and tibial motor responses are within normal limits. Right tibial H reflex study is within normal limits. Chronic motor axon loss changes are seen affecting the right abductor pollicis brevis, first dorsal interosseous, and flexor carpi ulnaris muscles, without accompanying active denervation.  Impression: Right median neuropathy at or distal to the wrist, consistent with a clinical diagnosis of carpal tunnel syndrome.  Overall, these findings are moderate in degree electrically. Right ulnar neuropathy with slowing across the elbow, demyelinating, mild. There is no evidence of a lumbosacral radiculopathy or large fiber sensorimotor polyneuropathy affecting the right lower extremity.   ___________________________ Tonita Blanch, DO    Nerve Conduction Studies   Stim Site NR Peak (ms) Norm Peak (ms) O-P Amp (V) Norm O-P Amp  Right Median Anti Sensory (2nd Digit)  32 C  Wrist    *3.9 <3.8 *9.6 >10  Right Sup Peroneal Anti Sensory (Ant Lat Mall)  32 C  12 cm    2.1 <4.6 6.7 >3  Right Sural Anti Sensory (Lat Mall)  32 C  Calf    1.9 <4.6 8.1 >3  Right Ulnar Anti Sensory  (5th Digit)  32 C  Wrist    2.9 <3.2 8.3 >5     Stim Site NR Onset (ms) Norm Onset (ms) O-P Amp (mV) Norm O-P Amp Site1 Site2 Delta-0 (ms) Dist (cm) Vel (m/s) Norm Vel (m/s)  Right Median Motor (Abd Poll Brev)  32 C  Wrist    3.9 <4.0 5.3 >5 Elbow Wrist 5.4 31.0 57 >50  Elbow    9.3  4.0         Right Peroneal Motor (Ext Dig Brev)  32 C  Ankle    2.9 <6.0 2.6 >2.5 B Fib Ankle 6.9 39.0 57 >40  B Fib    9.8  2.5  Poplt B Fib 1.5 8.0 53 >40  Poplt    11.3  2.3         Right Tibial Motor (Abd Hall Brev)  32 C  Ankle    3.2 <6.0 4.9 >4 Knee Ankle 7.7 42.0 55 >40  Knee    10.9  3.1         Right Ulnar Motor (Abd Dig Minimi)  32 C  Wrist    2.4 <3.1 8.1 >7 B Elbow Wrist 3.5 22.0 63 >50  B Elbow    5.9  7.0  A Elbow B Elbow 2.3 10.0 *43 >50  A Elbow    8.2  6.2          Electromyography   Side Muscle Ins.Act Fibs Fasc Recrt Amp Dur Poly Activation Comment  Right 1stDorInt Nml Nml Nml *1- *1+ *1+ *1+ Nml N/A  Right Abd [place] Brev  Nml Nml Nml *2- *1+ *1+ *1+ Nml N/A  Right PronatorTeres Nml Nml Nml Nml Nml Nml Nml Nml N/A  Right Biceps Nml Nml Nml Nml Nml Nml Nml Nml N/A  Right Triceps Nml Nml Nml Nml Nml Nml Nml Nml N/A  Right Deltoid Nml Nml Nml Nml Nml Nml Nml Nml N/A  Right AntTibialis Nml Nml Nml Nml Nml Nml Nml Nml N/A  Right Gastroc Nml Nml Nml Nml Nml Nml Nml Nml N/A  Right Flex Dig Long Nml Nml Nml Nml Nml Nml Nml Nml N/A  Right RectFemoris Nml Nml Nml Nml Nml Nml Nml Nml N/A  Right BicepsFemS Nml Nml Nml Nml Nml Nml Nml Nml N/A  Right GluteusMed Nml Nml Nml Nml Nml Nml Nml Nml N/A  Right FlexCarpiUln Nml Nml Nml *1- *1+ *1+ *1+ Nml N/A      Waveforms:

## 2024-03-14 ENCOUNTER — Other Ambulatory Visit: Payer: Self-pay

## 2024-03-14 MED ORDER — LEVOCETIRIZINE DIHYDROCHLORIDE 5 MG PO TABS: 5.0000 mg | ORAL_TABLET | Freq: Every evening | ORAL | 1 refills | Status: AC

## 2024-03-14 MED ORDER — PREGABALIN 150 MG PO CAPS: 150.0000 mg | ORAL_CAPSULE | Freq: Two times a day (BID) | ORAL | 1 refills | Status: AC

## 2024-03-14 MED ORDER — TAMSULOSIN HCL 0.4 MG PO CAPS: 0.4000 mg | ORAL_CAPSULE | Freq: Every day | ORAL | 1 refills | Status: AC

## 2024-03-14 MED ORDER — ROSUVASTATIN CALCIUM 10 MG PO TABS: 10.0000 mg | ORAL_TABLET | Freq: Every day | ORAL | 1 refills | Status: AC

## 2024-03-14 MED ORDER — HYDROCHLOROTHIAZIDE 25 MG PO TABS: 25.0000 mg | ORAL_TABLET | Freq: Every day | ORAL | 1 refills | Status: AC

## 2024-03-14 MED ORDER — VALACYCLOVIR HCL 500 MG PO TABS: 500.0000 mg | ORAL_TABLET | Freq: Every day | ORAL | 1 refills | Status: AC

## 2024-04-01 ENCOUNTER — Ambulatory Visit: Payer: Self-pay

## 2024-04-01 ENCOUNTER — Institutional Professional Consult (permissible substitution): Admitting: Psychology

## 2024-04-07 ENCOUNTER — Ambulatory Visit: Payer: Medicare Other

## 2024-04-07 VITALS — Ht 63.0 in | Wt 194.0 lb

## 2024-04-07 DIAGNOSIS — Z0000 Encounter for general adult medical examination without abnormal findings: Secondary | ICD-10-CM | POA: Diagnosis not present

## 2024-04-07 NOTE — Progress Notes (Signed)
 Because this visit was a virtual/telehealth visit,  certain criteria was not obtained, such a blood pressure, CBG if applicable, and timed get up and go. Any medications not marked as taking were not mentioned during the medication reconciliation part of the visit. Any vitals not documented were not able to be obtained due to this being a telehealth visit or patient was unable to self-report a recent blood pressure reading due to a lack of equipment at home via telehealth. Vitals that have been documented are verbally provided by the patient.   Subjective:   Angie [name] is a 78 y.o. who presents for a Medicare Wellness preventive visit.  As a reminder, Annual Wellness Visits don't include a physical exam, and some assessments may be limited, especially if this visit is performed virtually. We may recommend an in-person follow-up visit with your provider if needed.  Visit Complete: Virtual I connected with  Angie [name] on 04/07/24 by a audio enabled telemedicine application and verified that I am speaking with the correct person using two identifiers.  Patient Location: Home  Provider Location: Home Office  I discussed the limitations of evaluation and management by telemedicine. The patient expressed understanding and agreed to proceed.  Vital Signs: Because this visit was a virtual/telehealth visit, some criteria may be missing or patient reported. Any vitals not documented were not able to be obtained and vitals that have been documented are patient reported.  VideoDeclined- This patient declined Librarian, academic. Therefore the visit was completed with audio only.  Persons Participating in Visit: Patient.  AWV Questionnaire: No: Patient Medicare AWV questionnaire was not completed prior to this visit.  Cardiac Risk Factors include: advanced age (>79men, >78 women);dyslipidemia;hypertension;obesity (BMI >30kg/m2);sedentary lifestyle;family  history of premature cardiovascular disease     Objective:    Today's Vitals   04/07/24 0954  Weight: 194 lb (88 kg)  Height: 5' 3 (1.6 m)  PainSc: 0-No pain   Body mass index is 34.37 kg/m.     04/07/2024   10:02 AM 02/25/2024    9:42 AM 02/04/2024    8:02 AM 01/15/2024    9:44 AM 08/03/2023   10:38 AM 04/04/2023   10:30 AM 03/20/2023   10:37 AM  Advanced Directives  Does Patient Have a Medical Advance Directive? Yes Yes Yes Yes No Yes No  Type of Estate agent of [place];Living will Living will Healthcare Power of Attorney Living will  Healthcare Power of [place];Living will   Does patient want to make changes to medical advance directive? No - Patient declined No - Guardian declined No - Patient declined No - Patient declined  No - Patient declined   Copy of Healthcare Power of Attorney in Chart? Yes - validated most recent copy scanned in chart (See row information) Yes - validated most recent copy scanned in chart (See row information) No - copy requested   Yes - validated most recent copy scanned in chart (See row information)   Would patient like information on creating a medical advance directive? No - Patient declined No - Patient declined No - Patient declined No - Patient declined No - Patient declined      Current Medications (verified) Outpatient Encounter Medications as of 04/07/2024  Medication Sig   acetaminophen  (TYLENOL ) 500 MG tablet Take 500 mg by mouth every 6 (six) hours as needed for moderate pain.   azithromycin  (ZITHROMAX  Z-PAK) 250 MG tablet Take 2 tablets today and then 1 tablet daily for additional 4  days   betamethasone  valerate ointment (VALISONE ) 0.1 % Apply 1 Application topically 2 (two) times daily. Apply to back lesion   cycloSPORINE  (RESTASIS ) 0.05 % ophthalmic emulsion Place 1 drop into both eyes 2 (two) times daily.   diclofenac  Sodium (VOLTAREN ) 1 % GEL Apply 2 g topically 2 (two) times daily as needed (pain).    hydrochlorothiazide  (HYDRODIURIL ) 25 MG tablet Take 1 tablet (25 mg total) by mouth daily.   levocetirizine (XYZAL ) 5 MG tablet Take 1 tablet (5 mg total) by mouth every evening.   melatonin 3 MG TABS tablet Take 3 mg by mouth at bedtime as needed (sleep).   mometasone  (NASONEX ) 50 MCG/ACT nasal spray Place 2 sprays into the nose daily.   Multiple Minerals-Vitamins (CAL MAG ZINC +D3) TABS Take 2 tablets by mouth daily.   Multiple Vitamin (MULTIVITAMIN) capsule Take 1 capsule by mouth See admin instructions. Alternate taking 1 tablet daily for one month then switch to ocuvite multivitamin daily for one month   Multiple Vitamins-Minerals (OCUVITE PO) Take 1 tablet by mouth See admin instructions. Alternate taking 1 tablet daily for one month then switch to regular multivitamin daily for one month   omeprazole (PRILOSEC) 40 MG capsule Take 40 mg by mouth 2 (two) times daily. Started by ENT for nighttime coughing   pregabalin  (LYRICA ) 150 MG capsule Take 1 capsule (150 mg total) by mouth 2 (two) times daily.   promethazine -dextromethorphan (PROMETHAZINE -DM) 6.25-15 MG/5ML syrup Take 5 mLs by mouth every 12 (twelve) hours as needed for cough.   rosuvastatin  (CRESTOR ) 10 MG tablet Take 1 tablet (10 mg total) by mouth daily.   senna-docusate (SENOKOT-S) 8.6-50 MG tablet Take 1 tablet by mouth daily as needed for mild constipation.   SUMAtriptan  (IMITREX ) 25 MG tablet Take 1 tab for Migraine headache. May repeat dose 2-3 hrs later if there is no improvement, not more than 100 mg per day.   tamsulosin  (FLOMAX ) 0.4 MG CAPS capsule Take 1 capsule (0.4 mg total) by mouth daily.   tiZANidine  (ZANAFLEX ) 4 MG tablet Take 1 tablet (4 mg total) by mouth at bedtime as needed for muscle spasms.   traMADol  (ULTRAM ) 50 MG tablet Take 1-2 tablets (50-100 mg total) by mouth every 6 (six) hours as needed for severe pain or moderate pain.   valACYclovir  (VALTREX ) 500 MG tablet Take 1 tablet (500 mg total) by mouth daily.    No facility-administered encounter medications on file as of 04/07/2024.    Allergies (verified) Patient has no known allergies.   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  No signs of active disease and low risk for reactivation.  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  Patient declines referral to health department to discuss at this time.    Pre-diabetes    Past Surgical History:  Procedure Laterality Date   CARPAL TUNNEL RELEASE Bilateral 2012   CHOLECYSTECTOMY  1982   MAXIMUM ACCESS (MAS)POSTERIOR LUMBAR INTERBODY FUSION (PLIF) 2 LEVEL N/A 08/08/2018  Procedure: Decompression and  Fusion of Lumbar Four-Five Lumbar Five-Sacral One, Pedicle Screws of Lumbar Four- Lumbar Five, Lumbar Five- Sacral One, Lumbar Four-Lumbar Five Interbodies;  Surgeon: Unice Pac, MD;  Location: [facility] OR;  Service: Neurosurgery;  Laterality: N/A;  Decompression and  Fusion of Lumbar Four-Five Lumbar Five-Sacral One, Pedicle Screws of Lumbar Four- Lumbar Five, Lumbar Fiv   ROTATOR CUFF REPAIR Bilateral 2009   TOTAL HIP ARTHROPLASTY Right 08/22/2021   Procedure: TOTAL HIP ARTHROPLASTY;  Surgeon: Edna Toribio LABOR, MD;  Location: [facility] ORS;  Service: Orthopedics;  Laterality: Right;   TOTAL KNEE ARTHROPLASTY Left 08/16/2022   Procedure: TOTAL KNEE ARTHROPLASTY;  Surgeon: Edna Toribio LABOR, MD;  Location: [facility] ORS;  Service: Orthopedics;  Laterality: Left;   TUBAL LIGATION     Family History  Problem Relation Age of Onset   Diabetes Mother    Stroke  Mother    Hypertension Mother    Diabetes Father    Kidney disease Father    Heart attack Father    Diabetes Sister    Heart disease Sister    Heart attack Sister    Breast cancer Sister        30s   Diabetes Sister    Gout Sister    Alcohol  abuse Brother    Social History   Socioeconomic History   Marital status: Married    Spouse name: Not on file   Number of children: 2   Years of education: 16   Highest education level: Not on file  Occupational History   Not on file  Tobacco Use   Smoking status: Former    Current packs/day: 0.00    Average packs/day: 0.5 packs/day for 30.0 years (15.0 ttl pk-yrs)    Types: Cigarettes    Start date: 07/25/1963    Quit date: 07/24/1993    Years since quitting: 30.7   Smokeless tobacco: Never  Vaping Use   Vaping status: Never Used  Substance and Sexual Activity   Alcohol  use: No   Drug use: No   Sexual activity: Not Currently  Other Topics Concern   Not on file  Social History Narrative   Current Social History 04/25/2017            Patient lives with husband in one level home 04/25/2017   Transportation: Patient has own vehicle  04/25/2017   Important Relationships God, husband and children 04/25/2017    Pets: None 04/25/2017   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 04/25/2017   Other: Family and friends rely heavily on her guidance/wisdom. This is an Systems developer but also can be a burden.  04/25/2017   L. Jori, RN, BSN                                                                                                 Social Drivers of  Health   Financial Resource Strain: Low Risk  (04/07/2024)   Overall Financial Resource Strain (CARDIA)    Difficulty of Paying Living Expenses: Not hard at all  Food  Insecurity: No Food Insecurity (04/07/2024)   Hunger Vital Sign    Worried About Running Out of Food in the Last Year: Never true    Ran Out of Food in the Last Year: Never true  Transportation Needs: No Transportation Needs (04/07/2024)   PRAPARE - Administrator, Civil Service (Medical): No    Lack of Transportation (Non-Medical): No  Physical Activity: Sufficiently Active (04/07/2024)   Exercise Vital Sign    Days of Exercise per Week: 3 days    Minutes of Exercise per Session: 60 min  Stress: No Stress Concern Present (04/07/2024)   [employer] of Occupational Health - Occupational Stress Questionnaire    Feeling of Stress: Not at all  Social Connections: Moderately Integrated (04/07/2024)   Social Connection and Isolation Panel    Frequency of Communication with Friends and Family: More than three times a week    Frequency of Social Gatherings with Friends and Family: Three times a week    Attends Religious Services: More than 4 times per year    Active Member of Clubs or Organizations: No    Attends Banker Meetings: Never    Marital Status: Married    Tobacco Counseling Counseling given: Not Answered    Clinical Intake:  Pre-visit preparation completed: Yes  Pain : No/denies pain Pain Score: 0-No pain     BMI - recorded: 34.37 Nutritional Status: BMI > 30  Obese Nutritional Risks: None Diabetes: No  Lab Results  Component Value Date   HGBA1C 5.3 02/25/2024   HGBA1C 5.4 08/03/2023   HGBA1C 6.1 11/24/2022     How often do you need to have someone help you when you read instructions, pamphlets, or other written materials from your doctor or pharmacy?: 1 - Never  Interpreter Needed?: No  Information entered by :: Zaki Gertsch N. Miciah Shealy, LPN.   Activities of Daily Living     04/07/2024   10:02 AM  In your present state of health, do you have any difficulty performing the following activities:  Hearing? 0  Vision? 0  Difficulty  concentrating or making decisions? 1  Comment CONCENTRATING, CT OF HEAD PERFORMED. PATIENT HAS DECREASED COGNITIVE IMPAIRMENT.  Walking or climbing stairs? 0  Dressing or bathing? 0  Doing errands, shopping? 0  Preparing Food and eating ? N  Using the Toilet? N  In the past six months, have you accidently leaked urine? N  Do you have problems with loss of bowel control? N  Managing your Medications? N  Managing your Finances? N  Housekeeping or managing your Housekeeping? N    Patient Care Team: Anders Otto DASEN, MD as PCP - General (Family Medicine) Kristie Lamprey, MD as Consulting Physician (Gastroenterology) Audrey Mcardle, MD as Consulting Physician (Family Medicine) Gaspar Kung, MD as Referring Physician (Orthopedic Surgery) Debarah Lorrene DEL., MD (Ophthalmology)  I have updated your Care Teams any recent Medical Services you may have received from other providers in the past year.     Assessment:   This is a routine wellness examination for [place].  Hearing/Vision screen Hearing Screening - Comments:: Adequate hearing.  No hearing aids. Vision Screening - Comments:: Adequate vision, patient uses reading glasses. Patient is scheduled to see [facility] for her annual eye exam.   Goals Addressed  This Visit's Progress    04/07/24: My goal is to lose weight and get back into the [facility].         Depression Screen     04/07/2024   10:07 AM 02/25/2024    9:42 AM 01/15/2024    9:49 AM 08/03/2023   10:43 AM 04/04/2023   10:28 AM 03/20/2023   10:37 AM 03/02/2023   10:18 AM  PHQ 2/9 Scores  PHQ - 2 Score 0 0 0 0 0 0 0  PHQ- 9 Score 3 5 4 3 2 2 2     Fall Risk     04/07/2024   10:01 AM 02/25/2024    9:43 AM 02/04/2024    8:02 AM 01/15/2024    9:44 AM 08/03/2023   10:47 AM  Fall Risk   Falls in the past year? 0 0 0 0 0  Number falls in past yr: 0 0 0 0 0  Injury with Fall? 0 0 0 0 0  Risk for fall due to : No Fall Risks      Follow up Falls evaluation  completed  Falls evaluation completed      MEDICARE RISK AT HOME:  Medicare Risk at Home Any stairs in or around the home?: Yes If so, are there any without handrails?: No Home free of loose throw rugs in walkways, pet beds, electrical cords, etc?: Yes Adequate lighting in your home to reduce risk of falls?: Yes Life alert?: Yes (ADT) Use of a cane, walker or w/c?: Yes (PRN) Grab bars in the bathroom?: Yes Shower chair or bench in shower?: Yes Elevated toilet seat or a handicapped toilet?: Yes  TIMED UP AND GO:  Was the test performed?  No  Cognitive Function: Declined/Normal: No cognitive concerns noted by patient or family. Patient alert, oriented, able to answer questions appropriately and recall recent events. No signs of memory loss or confusion.    04/07/2024   10:09 AM  MMSE - Mini Mental State Exam  Not completed: Unable to complete      02/04/2024    5:00 PM  Montreal Cognitive Assessment   Visuospatial/ Executive (0/5) 3  Naming (0/3) 3  Attention: Read list of digits (0/2) 2  Attention: Read list of letters (0/1) 1  Attention: Serial 7 subtraction starting at 100 (0/3) 0  Language: Repeat phrase (0/2) 2  Language : Fluency (0/1) 1  Abstraction (0/2) 2  Delayed Recall (0/5) 5  Orientation (0/6) 6  Total 25  Adjusted Score (based on education) 25      04/07/2024   10:16 AM 04/07/2024    9:56 AM 04/04/2023   10:30 AM  6CIT Screen  What Year? 0 points 0 points 0 points  What month? 0 points 0 points 0 points  What time? 0 points 0 points 0 points  Count back from 20 0 points 0 points 0 points  Months in reverse 0 points 0 points 0 points  Repeat phrase 0 points 0 points 0 points  Total Score 0 points 0 points 0 points    Immunizations Immunization History  Administered Date(s) Administered   Fluad Quad(high Dose 65+) 07/27/2021, 04/18/2022   Influenza,inj,Quad PF,6+ Mos 04/25/2017, 05/22/2018, 07/06/2020   PFIZER Comirnaty(Gray Top)Covid-19 Tri-Sucrose  Vaccine 12/29/2020   PFIZER(Purple Top)SARS-COV-2 Vaccination 09/07/2019, 09/30/2019, 05/26/2020   Pfizer Covid-19 Vaccine Bivalent Booster 23yrs & up 07/27/2021   Pneumococcal Conjugate-13 09/08/2014   Pneumococcal Polysaccharide-23 02/08/2016   Td 05/21/2009   Tdap 03/16/2021   Zoster Recombinant(Shingrix) 01/05/2022,  05/15/2022    Screening Tests Health Maintenance  Topic Date Due   Influenza Vaccine  02/22/2024   COVID-19 Vaccine (6 - 2025-26 season) 03/24/2024   HEMOGLOBIN A1C  08/27/2024   Medicare Annual Wellness (AWV)  04/07/2025   DTaP/Tdap/Td (3 - Td or Tdap) 03/17/2031   Pneumococcal Vaccine: 50+ Years  Completed   DEXA SCAN  Completed   Hepatitis C Screening  Completed   Zoster Vaccines- Shingrix  Completed   HPV VACCINES  Aged Out   Meningococcal B Vaccine  Aged Out   Mammogram  Discontinued   Colonoscopy  Discontinued   Fecal DNA (Cologuard)  Discontinued    Health Maintenance Items Addressed: Yes Patient ware of care gaps. Patient declined Flu and Covid vaccines.  Additional Screening:  Vision Screening: Recommended annual ophthalmology exams for early detection of glaucoma and other disorders of the eye. Is the patient up to date with their annual eye exam?  Yes  Who is the provider or what is the name of the office in which the patient attends annual eye exams? Lorrene Devonshire, OD.  Dental Screening: Recommended annual dental exams for proper oral hygiene  Community Resource Referral / Chronic Care Management: CRR required this visit?  No   CCM required this visit?  No   Plan:    I have personally reviewed and noted the following in the patient's chart:   Medical and social history Use of alcohol , tobacco or illicit drugs  Current medications and supplements including opioid prescriptions. Patient is not currently taking opioid prescriptions. Functional ability and status Nutritional status Physical activity Advanced directives List of other  physicians Hospitalizations, surgeries, and ER visits in previous 12 months Vitals Screenings to include cognitive, depression, and falls Referrals and appointments  In addition, I have reviewed and discussed with patient certain preventive protocols, quality metrics, and best practice recommendations. A written personalized care plan for preventive services as well as general preventive health recommendations were provided to patient.   Angie LOISE Fuller, LPN   [id number]   After Visit Summary: (MyChart) Due to this being a telephonic visit, the after visit summary with patients personalized plan was offered to patient via MyChart   Notes: Nothing significant to report at this time.

## 2024-04-07 NOTE — Patient Instructions (Signed)
 Angie [name],  Thank you for taking the time for your Medicare Wellness Visit. I appreciate your continued commitment to your health goals. Please review the care plan we discussed, and feel free to reach out if I can assist you further.  Medicare recommends these wellness visits once per year to help you and your care team stay ahead of potential health issues. These visits are designed to focus on prevention, allowing your provider to concentrate on managing your acute and chronic conditions during your regular appointments.  Please note that Annual Wellness Visits do not include a physical exam. Some assessments may be limited, especially if the visit was conducted virtually. If needed, we may recommend a separate in-person follow-up with your provider.  Ongoing Care Seeing your primary care provider every 3 to 6 months helps us  monitor your health and provide consistent, personalized care.   Referrals If a referral was made during today's visit and you haven't received any updates within two weeks, please contact the referred provider directly to check on the status.  Recommended Screenings:  Health Maintenance  Topic Date Due   Flu Shot  02/22/2024   COVID-19 Vaccine (6 - 2025-26 season) 03/24/2024   Hemoglobin A1C  08/27/2024   Medicare Annual Wellness Visit  04/07/2025   DTaP/Tdap/Td vaccine (3 - Td or Tdap) 03/17/2031   Pneumococcal Vaccine for age over 3  Completed   DEXA scan (bone density measurement)  Completed   Hepatitis C Screening  Completed   Zoster (Shingles) Vaccine  Completed   HPV Vaccine  Aged Out   Meningitis B Vaccine  Aged Out   Breast Cancer Screening  Discontinued   Colon Cancer Screening  Discontinued   Cologuard (Stool DNA test)  Discontinued       04/07/2024   10:02 AM  Advanced Directives  Does Patient Have a Medical Advance Directive? Yes  Type of Estate agent of [place];Living will  Does patient want to make changes to  medical advance directive? No - Patient declined  Copy of Healthcare Power of Attorney in Chart? Yes - validated most recent copy scanned in chart (See row information)  Would patient like information on creating a medical advance directive? No - Patient declined   Advance Care Planning is important because it: Ensures you receive medical care that aligns with your values, goals, and preferences. Provides guidance to your family and loved ones, reducing the emotional burden of decision-making during critical moments.  Vision: Annual vision screenings are recommended for early detection of glaucoma, cataracts, and diabetic retinopathy. These exams can also reveal signs of chronic conditions such as diabetes and high blood pressure.  Dental: Annual dental screenings help detect early signs of oral cancer, gum disease, and other conditions linked to overall health, including heart disease and diabetes.  Please see the attached documents for additional preventive care recommendations.

## 2024-04-21 ENCOUNTER — Encounter: Admitting: Psychology

## 2024-05-28 ENCOUNTER — Ambulatory Visit: Payer: Self-pay | Admitting: Psychology

## 2024-05-28 ENCOUNTER — Ambulatory Visit (INDEPENDENT_AMBULATORY_CARE_PROVIDER_SITE_OTHER): Admitting: Psychology

## 2024-05-28 DIAGNOSIS — R4189 Other symptoms and signs involving cognitive functions and awareness: Secondary | ICD-10-CM

## 2024-05-28 DIAGNOSIS — F0670 Mild neurocognitive disorder due to known physiological condition without behavioral disturbance: Secondary | ICD-10-CM | POA: Diagnosis not present

## 2024-05-28 NOTE — Progress Notes (Unsigned)
 NEUROPSYCHOLOGICAL EVALUATION [place]  [place] Department of Neurology  Date of Evaluation: 05/28/2024  REASON FOR REFERRAL   Angie [name] is a 78 year old, {KDEISSHANDEDNESS:32240}-handed, Black female with 16 years of formal education. She was referred for neuropsychological evaluation by Camie Sevin, PA-C, to assess current neurocognitive functioning, document potential cognitive deficits, and assist with treatment planning. This is her first neuropsychological evaluation.  SUMMARY OF RESULTS   Premorbid cognitive abilities are estimated to be in the *** range based on word reading and sociodemographic factors. ***  DIAGNOSTIC IMPRESSION   Results of the current evaluation indicated ***  Sleep  ICD-10 Codes: ***  RECOMMENDATIONS   A repeat neuropsychological evaluation in *** months (or sooner if functional decline is noted) is recommended.  In consultation with your doctor, schedule cognitive reevaluation on an as-needed basis to assess for cognitive decline and update treatment recommendations. Reevaluation should occur during a period of medical and affective stability.***  ***Discuss with your neurologist the risks and benefits of starting a medication that can help slow memory decline.  Findings from this evaluation raise concern about the patient's ability to safely drive.*** Performance across neurocognitive testing is not a strong predictor of an individual's safety operating a motor vehicle. Should *** family wish to pursue a formalized driving evaluation, they could reach out to the following agencies:  [employer] [facility] in [place]: [phone] [facility] in [place]: [phone] [facility] in [place]: [phone] [facility] in [place]: [phone] or [phone]  Given ongoing sleep difficulties, she may benefit from the implementation of sleep hygiene  techniques, including:  Go to bed and get up at the same time each day to help your body establish a regular rhythm. Establish and maintain a bedtime routine. Certain activities such as stretching, meditating, listening to soft music, or reading ~15 minutes before bedtime can be a great way to regularly get your brain and body ready for sleep. Avoid taking naps during the day. Avoid alcohol  and caffeine for 5 or 6 hours before going to bed. Get regular exercise, but not in the hours before bedtime. Use comfortable bedding and maintain a cool temperature in your bedroom. Block out light and distracting noise. Avoid watching television or using your phone/computer in bed. Avoid staying in bed if you have difficulty falling asleep. If you have not been able to get to sleep after about 20 minutes or more, get up and do something calming or boring until you feel sleepy, then return to bed and try again.  Prioritize physical health through diet, exercise, and sleep. Regular physical activity supports cardiovascular health, improves mood, and helps preserve mobility and independence. Aim for at least 150 minutes of moderate aerobic exercise per week (e.g., brisk walking, swimming, gardening). A brain-healthy diet such as the Mediterranean or MIND diet is rich in fruits, vegetables, whole grains, healthy fats, and lean proteins, and has been associated with reduced risk of cognitive decline. Additionally, getting adequate, quality sleep and managing chronic conditions with the help of healthcare providers are essential components of healthy aging.  Continue to stay socially and mentally engaged. Maintaining strong social connections and regularly stimulating your brain can help protect against cognitive decline. This includes staying connected with friends and family, volunteering, or participating in community groups. Mentally engaging activities--such as reading, doing puzzles, playing strategy games, or  learning a new language or musical instrument--promote brain plasticity. If you are interested in activities to support cognitive engagement, this site offers a variety  of apps and games organized by difficulty level:  https://www.[facility].org/get-to-know-barrow/centers-programs/neurorehabilitation-center/neuro-rehab-apps-and-games/  Consider implementing compensatory strategies to maximize independence and maintain daily functioning. Examples include:  Adhere to routine. Compensatory strategies work best when they are used consistently. Use a planner, calendar, or white board that has the schedule and important events for the day clearly listed to reference and cross off when tasks are complete.  Ask for written information, especially if it is new or unfamiliar (e.g., information provided at a doctor's appointment).  Create an organized environment. Keep items that can be easily misplaced in a sensible location and get into the habit of always returning the items to those places. Pay attention and reduce distractions. Make a point of focusing attention on information you want to remember. One-on-one interaction is more likely to facilitate attention and minimize distraction. Make eye contact and repeat the information out loud after you hear it. Reduce interruptions or distractions especially when attempting to learn new information.  Create associations. When learning something new, think about and understand the information. Explain it in your own words or try to associate it with something you already know. Take notes to help remember important details. Evaluate goals and plan accordingly. When confronted by many different tasks, begin by making a list that prioritizes each task and estimates the time it will take to complete. Break down complicated tasks into smaller, more manageable steps. Focus on one task at a time and complete each task before starting another. Avoid  multitasking.  DISPOSITION   Patient will follow up with the referring provider, [name]. {KDEISSFOLLOWUP:32247}. She will be provided verbal feedback in approximately one week regarding the findings and impression during this visit.  The remainder of the report includes the details of the patient's background and a table of results from the current evaluation, which support the summary and recommendations described above.  BACKGROUND   History of Presenting Illness: The following information was obtained from a review of medical records and an interview with the patient. Briefly, the patient was evaluated by Camie Sevin, PA-C, at [facility] on 02/04/2024 for recent-onset cognitive concerns over the past few months, including forgetting what she is saying during conversations and struggling to verbally express herself despite knowing what she wants to say. MoCA = 25/30. Neuropsychological evaluation was requested accordingly.  Cognitive Functioning: During today's appointment, the patient reported cognitive changes over the past six months. She described increasing difficulty with speech/language, including not completing words or sentences and experiencing trouble getting words out. Although she initially thought this was stuttering, another provider advised that it is not. She reported word-finding difficulties and, at times, an inability to physically produce words despite knowing what she wants to say. She also occasionally experiences mild comprehension difficulties, which she does not attribute to hearing issues. These problems have led her to speak less frequently in group settings. She reported mild short-term memory issues, such as occasionally forgetting details of conversations or misplacing items. Additionally, she described attention difficulties characterized by frequently shifting between tasks without always returning to complete them, along with a reduced processing speed.  She denied significant problems with navigation, although she occasionally experiences brief moments of disorientation while driving. She also denied difficulties with executive functions such as planning and organizing.  Physical Functioning: Patient reported difficulties with both sleep initiation and maintenance. Despite these issues, she takes melatonin only occasionally, approximately twice per month Appetite is stable, with no reported changes to sense of taste and smell. Vision and hearing are  stable. She reported reduced balance but denied falls. She also denied any tremors.  Emotional Functioning: Patient described her recent mood as pretty happy. She denied suicidal ideation. She remains actively engaged during the week, participating in quilting and social groups. She also maintains regular routines, including taking her granddaughter to and from school, doing devotions, quilting, and preparing meals.  Neuroimaging: CT of the head (01/21/2024) documented a subtle 0.9 cm hyperdense region within the left uncus but was otherwise unremarkable.  Other Relevant Medical History: Remarkable for hypertension, hyperlipidemia, prediabetes, and gastroesophageal reflux disease. Please refer to the medical record for a more comprehensive problem list. No history of stroke, CNS infection, head injury, or seizure was reported.  Current Medications: Per record, acetaminophen , azithromycin , betamethasone  valerate ointment, calcium -magnesium -zinc with vitamin D3, cyclosporine  ophthalmic emulsion, diclofenac  sodium, hydrochlorothiazide , levocetirizine, melatonin, mometasone  nasal spray, multivitamin, Ocuvite, omeprazole, pregabalin , promethazine -dextromethorphan, rosuvastatin , sumatriptan , tamsulosin , tizanidine , tramadol , and valacyclovir .  Functional Status: Patient independently performs all basic and instrumental (e.g., driving, finances, medications, meal preparation) activities of daily living without  reported difficulty.  Family Neurological History: Unremarkable.  Psychiatric History: History of depression, anxiety, prior mental health treatment, suicidal ideation, hallucinations, and psychiatric hospitalizations was not reported.  Substance Use History: Patient denied current use of alcohol , nicotine, marijuana, and other illicit substances.  Social and Developmental History: Patient was born in [place]. History of perinatal complications and developmental delays was not reported. She is married and lives with her husband. They have two daughters.   Educational and Occupational History: No history of childhood learning disability, special education services, or grade retention was reported. 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  BEHAVIORAL OBSERVATIONS   Patient arrived on time and was unaccompanied. She ambulated independently and without gait disturbance. She was alert and {KDEISSBO3:32256}. She was appropriately groomed and dressed for the setting. No significant sensory or motor abnormalities were observed. Vision (with glasses) and hearing were adequate for testing purposes. Speech was of normal rate, prosody, and volume. No conversational word-finding difficulties, paraphasic errors, or dysarthria were observed. Comprehension was conversationally intact. Thought processes were linear, logical, and coherent. Thought content was organized and devoid of delusions. Insight appeared appropriate. Affect was even and congruent with euthymic mood. She was cooperative and appeared to give adequate effort during testing, including on standalone and embedded measures of performance validity***. Results are thought to accurately reflect her cognitive functioning at this time.  NEUROPSYCHOLOGICAL TESTING RESULTS   Tests Administered:  {KDEISSNPTESTS:32266}  Test results are provided in the table below. Whenever possible, the patient's scores were compared against age-, sex-, and education-corrected normative samples. Interpretive descriptions are based on the AACN consensus conference statement on uniform labeling (Guilmette et al., 2020).  ***  INFORMED CONSENT   Patient was provided with a verbal description of the nature and purpose of the neuropsychological evaluation. Also reviewed were the foreseeable risks and/or discomforts and benefits of the procedure, limits of confidentiality, and mandatory reporting requirements of this provider. Patient was given the opportunity to have their questions answered. Oral consent to participate was provided by the patient.   This report was prepared as part of a clinical evaluation and is not intended for forensic use.  SERVICE   This evaluation was conducted by Renda Beckwith, Psy.D. In addition to time spent directly with the patient, total professional time (*** minutes) includes record review,  integration of relevant medical history, test selection, interpretation of findings, and report preparation. A technician, Lonell Jude, B.S., provided testing and scoring assistance (*** minutes).  Psychiatric Diagnostic Evaluation Services (Professional): 09208 x 1 Neuropsychological Testing Evaluation Services (Professional): 03867 x 1 Neuropsychological Testing Evaluation Services (Professional): 03866 x *** Neuropsychological Test Administration and Scoring (Technician): [phone] x 1 Neuropsychological Test Administration and Scoring (Technician): [phone] x ***  This report was generated using voice recognition software. While this document has been carefully reviewed, transcription errors may be present. I apologize in advance for any inconvenience. Please contact me if further clarification is needed.            Renda Beckwith, Psy.D.             Neuropsychologist

## 2024-05-28 NOTE — Progress Notes (Signed)
   Psychometrician Note   Cognitive testing was administered to Angie [name] by Lonell Jude, B.S. (psychometrist) under the supervision of Dr. Renda Beckwith, Psy.D., licensed psychologist on 05/28/2024. [name] did not appear overtly distressed by the testing session per behavioral observation or responses across self-report questionnaires. Rest breaks were offered.   The battery of tests administered was selected by Dr. Renda Beckwith, Psy.D. with consideration to [name]'s current level of functioning, the nature of her symptoms, emotional and behavioral responses during interview, level of literacy, observed level of motivation/effort, and the nature of the referral question. This battery was communicated to the psychometrist. Communication between Dr. Renda Beckwith, Psy.D. and the psychometrist was ongoing throughout the evaluation and Dr. Renda Beckwith, Psy.D. was immediately accessible at all times. Dr. Renda Beckwith, Psy.D. provided supervision to the psychometrist on the date of this service to the extent necessary to assure the quality of all services provided.    Angie [name] will return within approximately 1-2 weeks for an interactive feedback session with Dr. Beckwith at which time her test performances, clinical impressions, and treatment recommendations will be reviewed in detail. [name] understands she can contact our office should she require our assistance before this time.  A total of 140 minutes of billable time were spent face-to-face with [name] by the psychometrist. This includes both test administration and scoring time. Billing for these services is reflected in the clinical report generated by Dr. Renda Beckwith, Psy.D.  This note reflects time spent with the psychometrician and does not include test scores or any clinical interpretations made by Dr. Beckwith. The full report will follow in a separate note.

## 2024-06-02 ENCOUNTER — Telehealth: Payer: Self-pay

## 2024-06-02 NOTE — Telephone Encounter (Signed)
 I called and spoke with Heather from the number provided. They request an official MRI clearance request from our office. I will complete this soon.  [facility] Fax#: [phone] Attn: Rhoda Receptionist: [place]

## 2024-06-02 NOTE — Telephone Encounter (Signed)
 Patient calls nurse line in regards to MRI clearance.   She reports she spoke with Rosina over at [facility]. She reports she was told Dr. Unice retired ~5-6 years ago.   She reports Rosina informed her that her PCP would need to call and speak with someone in regards to case.  She reports she feels she has done all she can advocating for herself.  She asks PCP call [facility].   [phone]  Advised will forward to PCP.

## 2024-06-03 ENCOUNTER — Encounter: Payer: Self-pay | Admitting: Family Medicine

## 2024-06-03 NOTE — Telephone Encounter (Signed)
 Spoke with patient. Clearance letter sent to her. She will print and take to neuro-surg's office.

## 2024-06-04 ENCOUNTER — Ambulatory Visit (INDEPENDENT_AMBULATORY_CARE_PROVIDER_SITE_OTHER): Admitting: Psychology

## 2024-06-04 DIAGNOSIS — F0670 Mild neurocognitive disorder due to known physiological condition without behavioral disturbance: Secondary | ICD-10-CM

## 2024-06-04 NOTE — Progress Notes (Signed)
   NEUROPSYCHOLOGY FEEDBACK SESSION [place]. [facility]  [place] Department of Neurology  Date of Feedback Session: 06/04/2024  REASON FOR REFERRAL   Angie [name] is a 78 year old, right-handed, Black female with 16 years of formal education. She was referred for neuropsychological evaluation by Camie Sevin, PA-C, to assess current neurocognitive functioning, document potential cognitive deficits, and assist with treatment planning. This is her first neuropsychological evaluation.  FEEDBACK   Patient completed a comprehensive neuropsychological evaluation on 05/28/2024. Please refer to that encounter for the full report and recommendations. Briefly, results indicated variability across measures of working memory, executive functioning, language, visuospatial abilities, and learning/memory. 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 Considering the pertinent background information and current test findings, the etiology of the identified deficits is not entirely clear at this time. Notably, she demonstrated difficulties on tasks of visuoperception, visual tracking, and visual learning/memory, raising concerns about visuospatial dysfunction versus potential vision-related factors. At this time, the overall clinical picture is not strongly suggestive of a neurodegenerative process, although one cannot be ruled out definitively.   Today, the patient was unaccompanied. She was provided verbal feedback regarding the findings and impression during this visit, and her questions were answered. A copy of the report was provided at the conclusion of the visit.  DISPOSITION   Patient will follow up with the referring provider, [name]. She should return for repeat neuropsychological testing in 12-18 months to monitor her course and assist with diagnosis and treatment  planning.   SERVICE   This feedback session was conducted by Renda Beckwith, Psy.D. One unit of 03867 (40 minutes) was billed for Dr. Beckwith' time spent in preparing, conducting, and documenting the current feedback session.  This report was generated using voice recognition software. While this document has been carefully reviewed, transcription errors may be present. I apologize in advance for any inconvenience. Please contact me if further clarification is needed.

## 2024-06-04 NOTE — Telephone Encounter (Signed)
 Patient came in and signed ROI. I have attached letter and faxed  it over.   Thanks!

## 2024-06-16 ENCOUNTER — Telehealth: Payer: Self-pay

## 2024-06-16 NOTE — Telephone Encounter (Signed)
 Patient calls nurse line requesting an update.   Advised will forward to PCP.

## 2024-06-16 NOTE — Telephone Encounter (Signed)
 Patient calls nurse line reporting cough and congestion.   She reports greenish/yellow mucous coming out of my nose when she blows her nose. She reports a productive cough. She reports symptoms have been present for a few days.   She denies any fevers or chills. She denies any shortness of breath. No nausea, vomiting or diarrhea. No facial pain.   Apt offered for evaluation, however patient states she will continue to monitor.   Conservative measures and ED precautions discussed.

## 2024-06-17 NOTE — Telephone Encounter (Signed)
 I called over to [facility].  I spoke with receptionist who then transferred me to [place], one of the nurses. I was sent to her VM.   Will try again later today.

## 2024-06-18 ENCOUNTER — Ambulatory Visit: Admitting: Family Medicine

## 2024-06-18 ENCOUNTER — Encounter: Payer: Self-pay | Admitting: Family Medicine

## 2024-06-18 VITALS — BP 127/81 | HR 81 | Temp 98.3°F | Ht 63.0 in | Wt 205.2 lb

## 2024-06-18 DIAGNOSIS — R051 Acute cough: Secondary | ICD-10-CM

## 2024-06-18 LAB — POC SOFIA 2 FLU + SARS ANTIGEN FIA
Influenza A, POC: NEGATIVE
Influenza B, POC: NEGATIVE
SARS Coronavirus 2 Ag: NEGATIVE

## 2024-06-18 MED ORDER — BENZONATATE 100 MG PO CAPS: 100.0000 mg | ORAL_CAPSULE | Freq: Two times a day (BID) | ORAL | 0 refills | Status: AC | PRN

## 2024-06-18 NOTE — Telephone Encounter (Signed)
 Patient confirmed she now has the right form and will take to orthopedic soon. She also stated that her cough with sputum production lingers. She wants to be seen today. Appointment made with Dr. Sheppard Pinal.

## 2024-06-18 NOTE — Progress Notes (Signed)
    SUBJECTIVE:   CHIEF COMPLAINT / HPI:   Cough w/ sputum x1 week.  Reports that she is having copious mucus to the point where sometimes she is using her toothbrush to fish it out of the back of her throat.  Denies any other symptoms such as shortness of breath, fever, chills.  She does report in general feeling fatigued along with this illness.  PERTINENT  PMH / PSH: allergic rhinits, nasal polyp  OBJECTIVE:   BP 127/81   Pulse 81   Temp 98.3 F (36.8 C)   Ht 5' 3 (1.6 m)   Wt 205 lb 3.2 oz (93.1 kg)   SpO2 97%   BMI 36.35 kg/m   General: A&O, NAD HEENT: No sign of trauma, EOM grossly intact.  No tenderness of the frontal, maxillary and paranasal sinuses, boggy nasal turbinates with some mucus evident.  Posterior oropharynx is mildly erythematous but there is no swelling or tonsillar exudate noted Cardiac: RRR, no m/r/g Respiratory: CTAB, normal WOB, no w/c/r  ASSESSMENT/PLAN:   Assessment & Plan Acute cough - Likely viral in nature, patient is outside the treatment window for COVID and flu, will swab for diagnostic purposes only- negative -Patient requesting Tessalon  Perles which have worked for her in the past, [facility] [place] -Counseled patient on supportive care including nasal saline flush, hot steam showers and tea with honey for inning and removing mucus   Lucie Pinal, DO [facility]

## 2024-06-18 NOTE — Patient Instructions (Signed)
 It was wonderful to see you today!  Congestion and cough are most likely caused by a viral illness.  Get plenty of rest and drink plenty of fluids as you recover, and if you start to have worsening symptoms, fever, facial pain or any other concerning symptoms, please call back and schedule a follow-up appointment.  I sent the requested Tessalon  Perles to your pharmacy and Rafael Gonzalez.  Are a few over-the-counter remedies that you can try to help with your congestion, please note that we do not recommend the use of over-the-counter decongestions in our elderly patients as they can contribute to increased falls and other undesirable side effects.  -Nasal saline rinses-while uncomfortable they are very effective at breaking up a large mucous clogs and helping open nasal passages.  [facility] kits [facility] which contain everything you need as well as the instructions on how to use them - Hot steam showers-the increased humidity will help thin and break up the mucus - Hot tea with honey-the steam will help thin and break up your mucus, and the honey's tooth send coats the throat to prevent irritation  Please call [phone] with any questions about today's appointment.   If you need any additional refills, please call your pharmacy before calling the office.  Lucie Pinal, DO Family Medicine

## 2024-06-25 ENCOUNTER — Telehealth: Payer: Self-pay | Admitting: Family Medicine

## 2024-06-25 NOTE — Telephone Encounter (Signed)
 Patient is coming in on 12/12 @1030  seeing pcp

## 2024-06-26 DIAGNOSIS — H3581 Retinal edema: Secondary | ICD-10-CM | POA: Diagnosis not present

## 2024-06-26 DIAGNOSIS — H524 Presbyopia: Secondary | ICD-10-CM | POA: Diagnosis not present

## 2024-06-26 DIAGNOSIS — H3562 Retinal hemorrhage, left eye: Secondary | ICD-10-CM | POA: Diagnosis not present

## 2024-06-26 DIAGNOSIS — H35363 Drusen (degenerative) of macula, bilateral: Secondary | ICD-10-CM | POA: Diagnosis not present

## 2024-06-26 DIAGNOSIS — H04123 Dry eye syndrome of bilateral lacrimal glands: Secondary | ICD-10-CM | POA: Diagnosis not present

## 2024-06-26 NOTE — Telephone Encounter (Signed)
 Patient calls nurse reporting she dropped off the correspondence to [facility].  Will forward to PCP to make aware.

## 2024-06-26 NOTE — Telephone Encounter (Signed)
 Patient is coming in on 12/12 @1030 

## 2024-07-01 ENCOUNTER — Telehealth: Payer: Self-pay

## 2024-07-01 NOTE — Telephone Encounter (Signed)
 Patient LVM on nurse line.   She reports she has an upcoming with PCP on 12/12.  She reports she would like to discuss starting a GLP-1.   Will forward to PCP to make aware.

## 2024-07-04 ENCOUNTER — Ambulatory Visit: Admitting: Family Medicine

## 2024-07-04 ENCOUNTER — Encounter: Payer: Self-pay | Admitting: Family Medicine

## 2024-07-04 VITALS — BP 127/82 | HR 81

## 2024-07-04 DIAGNOSIS — G939 Disorder of brain, unspecified: Secondary | ICD-10-CM | POA: Diagnosis present

## 2024-07-04 DIAGNOSIS — R519 Headache, unspecified: Secondary | ICD-10-CM

## 2024-07-04 DIAGNOSIS — I878 Other specified disorders of veins: Secondary | ICD-10-CM | POA: Diagnosis not present

## 2024-07-04 MED ORDER — SEMAGLUTIDE-WEIGHT MANAGEMENT 0.25 MG/0.5ML ~~LOC~~ SOAJ: 0.2500 mg | SUBCUTANEOUS | 0 refills | Status: AC

## 2024-07-04 NOTE — Assessment & Plan Note (Addendum)
 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 No mass effect observed. Etiology unknown. MRI recommended to exclude a mass. Unfortunately, she has metal in her body. Neurosurgery is unable to help regarding whether her surgical metal is MRI compatible. It'll be unsafe to get an MRI at this point - Denies any change in neuro symptoms.  - Ordered repeat CT head scan. - Offered referral to neurosurgery for second opinion regarding MRI brain. She declined and preferred repeat CT head for now. - CT head ordered - ED precautions discussed

## 2024-07-04 NOTE — Patient Instructions (Signed)

## 2024-07-04 NOTE — Progress Notes (Signed)
° ° °  SUBJECTIVE:   CHIEF COMPLAINT / HPI:   Discussed the use of AI scribe software for clinical note transcription with the patient, who gave verbal consent to proceed.  History of Present Illness   Angie [name] is a 78 year old female who presents with worsening bilateral leg swelling and concerns of cellulitis.  She has experienced worsening swelling and puffiness in both legs over the past month, accompanied by a pink discoloration. The skin is rough and sometimes itchy, leading her to apply oil after showering. No pain is associated with the swelling. She has a history of intermittent leg swelling, which has been progressively worsening recently.  She acknowledges a high salt intake, particularly from eating chips. She has previously used compression stockings but found them uncomfortable due to tightness around her toes. She is considering using them again.  Her medication regimen has not changed since her last visit, and she is not currently taking any antibiotics. She has discontinued the use of azithromycin , multivitamins with calcium  and zinc, cough medicine, and senna. She has regained weight since discontinuing Ozempic  in June 2025.  A previous CT scan in June 2025 showed a 0.9 cm hyperdense lesion in the brain with no mass effect. She has not experienced any changes in her usual headaches and is considering further imaging to monitor the lesion.  No pain in the legs despite the swelling and no changes in her usual headaches.       PERTINENT  PMH / PSH: PMHx reviewed  OBJECTIVE:   Vitals:   07/04/24 0948 07/04/24 1005  BP: (!) 141/87 127/82  Pulse: 81   SpO2: 95%     Physical Exam   VITALS: P- 81, BP- 127/82, SaO2- 95% CHEST: Lungs clear to auscultation bilaterally, no wheezing. CARDIOVASCULAR: Heart regular rate and rhythm, no murmurs. EXTREMITIES: Bilateral leg swelling, more on left, non-pitting puffiness, spinal vein on both lower extremities, no calf  tenderness, no major erythema. NEUROLOGICAL: Good motor strength, sensation intact. No neurologic deficit.       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 No mass effect observed. Etiology unknown. MRI recommended to exclude a mass. Unfortunately, she has metal in her body. Neurosurgery is unable to help regarding whether her surgical metal is MRI compatible. It'll be unsafe to get an MRI at this point - Denies any change in neuro symptoms.  - Ordered repeat CT head scan. - Offered referral to neurosurgery for second opinion regarding MRI brain. She declined and preferred repeat CT head for now. - CT head ordered - ED precautions discussed  Venous stasis Chronic venous insufficiency of lower extremities Bilateral leg swelling, more pronounced on the left, with non-pitting puffiness and spider veins. Likely due to venostasis and vascular dilation rather than fluid retention. No signs of infection - Advised reduction of salt intake. - Recommended use of compression stockings. - Advised leg elevation when seated.  Morbid obesity (HCC) BMI 36 with HTN Weight gain since discontinuation of Ozempic  in June. Current weight is 205 lbs, up from 193 lbs. A1c is 5.3, indicating no diabetes. Insurance coverage for Ozempic  may be limited without a diabetes diagnosis. Wegovy  preferred for weight loss as it is primarily indicated for this purpose. - Prescribed Wegovy  (semaglutide ) for weight loss. - Sent prescription to pharmacy.     Otto Fairly, MD [facility]

## 2024-07-04 NOTE — Assessment & Plan Note (Addendum)
 Chronic venous insufficiency of lower extremities Bilateral leg swelling, more pronounced on the left, with non-pitting puffiness and spider veins. Likely due to venostasis and vascular dilation rather than fluid retention. No signs of infection - Advised reduction of salt intake. - Recommended use of compression stockings. - Advised leg elevation when seated.

## 2024-07-04 NOTE — Assessment & Plan Note (Addendum)
 BMI 36 with HTN Weight gain since discontinuation of Ozempic  in June. Current weight is 205 lbs, up from 193 lbs. A1c is 5.3, indicating no diabetes. Insurance coverage for Ozempic  may be limited without a diabetes diagnosis. Wegovy  preferred for weight loss as it is primarily indicated for this purpose. - Prescribed Wegovy  (semaglutide ) for weight loss. - Sent prescription to pharmacy.

## 2024-07-07 ENCOUNTER — Other Ambulatory Visit (HOSPITAL_COMMUNITY): Payer: Self-pay

## 2024-07-07 ENCOUNTER — Telehealth: Payer: Self-pay

## 2024-07-07 NOTE — Telephone Encounter (Signed)
 Pharmacy Patient Advocate Encounter   Received notification from Patient Pharmacy that prior authorization for WEGOVY  is required/requested.   Insurance verification completed.   The patient is insured through [facility].

## 2024-07-07 NOTE — Telephone Encounter (Signed)
 Patients insurance doesn't cover medications for weight loss.

## 2024-07-09 ENCOUNTER — Other Ambulatory Visit: Payer: Self-pay | Admitting: Family Medicine

## 2024-07-10 ENCOUNTER — Other Ambulatory Visit: Payer: Self-pay | Admitting: Family Medicine

## 2024-07-14 ENCOUNTER — Ambulatory Visit (HOSPITAL_COMMUNITY)
Admission: RE | Admit: 2024-07-14 | Discharge: 2024-07-14 | Disposition: A | Discharge status: Home / Self Care | Source: Ambulatory Visit | Attending: Family Medicine | Admitting: Family Medicine

## 2024-07-14 DIAGNOSIS — R519 Headache, unspecified: Secondary | ICD-10-CM | POA: Insufficient documentation

## 2024-07-14 MED ORDER — IOHEXOL 300 MG/ML  SOLN
75.0000 mL | Freq: Once | INTRAMUSCULAR | Status: AC | PRN
  Administered 2024-07-14: 75 mL via INTRAVENOUS

## 2024-07-25 ENCOUNTER — Ambulatory Visit: Payer: Self-pay | Admitting: Family Medicine

## 2024-07-25 DIAGNOSIS — G9389 Other specified disorders of brain: Secondary | ICD-10-CM

## 2024-07-25 DIAGNOSIS — R519 Headache, unspecified: Secondary | ICD-10-CM

## 2024-07-25 NOTE — Telephone Encounter (Signed)
 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 Need further evaluation. She denies headaches or neurologic symptoms. Deferring MRI due to metal in the body. Referral back to neurosurgery was discussed, and she agrees with that. ED precautions and red flag symptoms that warrant immediate ED evaluation were discussed. She verbalized understanding. Neurosurg referral order placed. Previously established care with [facility].

## 2024-08-01 ENCOUNTER — Ambulatory Visit: Admitting: Student

## 2024-08-01 ENCOUNTER — Encounter: Payer: Self-pay | Admitting: Student

## 2024-08-01 ENCOUNTER — Other Ambulatory Visit: Payer: Self-pay | Admitting: Neurosurgery

## 2024-08-01 VITALS — BP 132/71 | HR 77 | Temp 98.3°F | Ht 63.0 in | Wt 208.2 lb

## 2024-08-01 DIAGNOSIS — Z23 Encounter for immunization: Secondary | ICD-10-CM

## 2024-08-01 DIAGNOSIS — R2681 Unsteadiness on feet: Secondary | ICD-10-CM

## 2024-08-01 DIAGNOSIS — R062 Wheezing: Secondary | ICD-10-CM

## 2024-08-01 DIAGNOSIS — I671 Cerebral aneurysm, nonruptured: Secondary | ICD-10-CM

## 2024-08-01 MED ORDER — IPRATROPIUM BROMIDE 0.02 % IN SOLN
0.5000 mg | Freq: Once | RESPIRATORY_TRACT | Status: AC
  Administered 2024-08-01: 0.5 mg via RESPIRATORY_TRACT

## 2024-08-01 MED ORDER — ALBUTEROL SULFATE HFA 108 (90 BASE) MCG/ACT IN AERS: 2.0000 | INHALATION_SPRAY | Freq: Four times a day (QID) | RESPIRATORY_TRACT | 2 refills | Status: AC | PRN

## 2024-08-01 MED ORDER — ALBUTEROL SULFATE (2.5 MG/3ML) 0.083% IN NEBU
2.5000 mg | INHALATION_SOLUTION | Freq: Once | RESPIRATORY_TRACT | Status: AC
  Administered 2024-08-01: 2.5 mg via RESPIRATORY_TRACT

## 2024-08-01 MED ORDER — PREDNISONE 20 MG PO TABS: 40.0000 mg | ORAL_TABLET | Freq: Every day | ORAL | 0 refills | Status: AC

## 2024-08-01 MED ORDER — AZITHROMYCIN 500 MG PO TABS: ORAL_TABLET | ORAL | 0 refills | Status: AC

## 2024-08-01 NOTE — Progress Notes (Signed)
" ° ° °  SUBJECTIVE:   CHIEF COMPLAINT / HPI:   Cough Cough 3 weeks.  Using mucinex  Distant smoking history (smoked for 30 years 0.5 ppd, quit for 30 years) No fevers Reports headaches at start of cough, now improving  OBJECTIVE:   BP 132/71   Pulse 77   Temp 98.3 F (36.8 C) (Oral)   Ht 5' 3 (1.6 m)   Wt 208 lb 4 oz (94.5 kg)   SpO2 97%   BMI 36.89 kg/m    General: NAD, pleasant Cardio: RRR, no MRG. Cap Refill <2s. Respiratory: Diffuse bilateral expiratory wheezing, prolonged expiratory phase, mild increased work of breathing, normal oxygen on room air.  Reevaluation after DuoNeb: Significantly improved expiratory wheezing, improved expiratory phase, normal work of breathing on room air GI: Abdomen is soft, not tender, not distended. BS present Skin: Warm and dry  ASSESSMENT/PLAN:   Assessment & Plan Wheezing Differential: COPD exacerbation, viral bronchitis - Significant improvement with DuoNeb treatment in office today - Trial prednisone  40 mg daily 5-day course, azithromycin  500 mg daily 3-day course - Albuterol  inhaler 2 to 4 puffs every 6 hours, demonstrated how to use in office - Follow-up in 1 week with PCP - If cough not improved, recommend chest x-ray - Would consider PFTs given smoking history v.  Treating COPD based on clinical features   Gladis Church, DO [facility] "

## 2024-08-01 NOTE — Patient Instructions (Addendum)
 It was great to see you! Thank you for allowing me to participate in your care!   I recommend that you always bring your medications to each appointment as this makes it easy to ensure we are on the correct medications and helps us  not miss when refills are needed.  Our plans for today:  - Take 1 tablet of azithromycin  once daily for 3 days - Take 2 tablets of prednisone  (40 mg total) once a day with breakfast for 5 days - You can take 2 to 4 puffs of your albuterol  inhaler every 6 hours as needed for wheezing and shortness of breath - Please follow-up with PCP in the next week   Take care and seek immediate care sooner if you develop any concerns. Please remember to show up 15 minutes before your scheduled appointment time!  Gladis Church, DO [facility]

## 2024-08-04 NOTE — Progress Notes (Signed)
 "   Mild Cognitive Impairment    Angie [name] is a very pleasant 79 y.o. RH female with a history of *** presenting today in follow-up for evaluation of memory loss. Patient is on ***. Patient was last seen on 02/04/2024***. Memory is stable.  Patient is able to participate on ADLs and to to drive without difficulties. Mood is ***. This patient is here alone.  Previous records as well as any outside records available were reviewed prior to todays visit   Follow up in  *** months Repeat neuropsych evaluation in 12 to 18 months for diagnostic clarity At this moment, no indication for antidementia medication, if decline is noted, we will consider ACHI*** Continue to control mood as per PCP Continue speech therapy for subjective difficulties with verbal expression  Recommend good control of cardiovascular risk factors      Discussed the use of AI scribe software for clinical note transcription with the patient, who gave verbal consent to proceed.  History of Present Illness      Neuropsych evaluation 05/28/2024, Dr. Gayland Briefly, results indicated variability across measures of working memory, executive functioning, language, visuospatial abilities, and learning/memory. 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 Considering the pertinent background information and current test findings, the etiology of the identified deficits is not entirely clear at this time. Notably, she demonstrated difficulties on tasks of visuoperception, visual tracking, and visual learning/memory, raising concerns about visuospatial dysfunction versus potential vision-related factors. At this time, the overall clinical picture is not strongly suggestive of a neurodegenerative process, although one cannot be ruled out definitively.      How long did patient have memory difficulties?  For about 2 or 3  months .  She reports that she may forget what she is saying to me of a conversation.  She knows the word that she cannot retrieve them.  Cannot get them out .  I feel less fluent she patient reports some difficulty remembering new information, recent conversations, names.  If I am in the computer, I have to focus on what I am pressing .  She still able to recognize members of her family and other long-term issues.  She enjoys making quilts, sewing and she is able to remember all the steps. repeats oneself?  Endorsed Disoriented when walking into a room? Denies    Leaving objects in unusual places?  Denies.   Wandering behavior? Denies.   Any personality changes, or depression, anxiety? Denies   Hallucinations or paranoia? Denies.   Seizures? Denies.    Any sleep changes?  Sleeps well, denies frequent nightmares or dream reenactment, other REM behavior or sleepwalking   Sleep apnea? Denies.   Any hygiene concerns?  Denies.   Independent of bathing and dressing? Endorsed  Does the patient need help with medications?  Patient's is in charge   Who is in charge of the finances?  Patient's is in charge     Any changes in appetite?   Denies.  Last year I was on Ozempic  and lost 25 pounds, now I am no longer on that .    Patient have trouble swallowing?  Except for large pills, she denies any trouble swallowing. Does the patient cook? Yes, denies forgetting common recipes or kitchen accidents   Any headaches?  Denies.   Chronic pain? Denies.   Ambulates with difficulty?  She reports gait instability, leaning to the left, I am walking cautiously .  I do not like going to the gym but sometimes ago. Recent falls or head injuries? Denies.     Vision changes?  Denies any new issues.    Any strokelike symptoms? Denies. Any paresthesias?  Endorsed, she reports tingling sensations on her fingers, flaming at times ,  spread to the toes, accompanied sometimes by mild numbness, no extremity  weakness. Any tremors? Denies.   Any anosmia? Denies.   Any incontinence of urine? Residual urine, takes Flomax  Any bowel dysfunction? Denies.      History of heavy alcohol  intake? Denies.   History of heavy tobacco use? Denies. Patient lives with her husband Family history of dementia?  Denies . Mo dies with strokex2   Does patient drive?   yes, denies getting lost.   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  Never received treatment.  No signs of active disease and low risk for reactivation.  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  Patient declines referral to health department to discuss at this time.    Pre-diabetes      Past Surgical History:  Procedure Laterality Date   CARPAL TUNNEL RELEASE Bilateral 2012   CHOLECYSTECTOMY  1982   MAXIMUM ACCESS (MAS)POSTERIOR LUMBAR INTERBODY FUSION (PLIF) 2 LEVEL N/A 08/08/2018   Procedure: Decompression and  Fusion of Lumbar Four-Five Lumbar  Five-Sacral One, Pedicle Screws of Lumbar Four- Lumbar Five, Lumbar Five- Sacral One, Lumbar Four-Lumbar Five Interbodies;  Surgeon: Unice Pac, MD;  Location[facility] OR;  Service: Neurosurgery;  Laterality: N/A;  Decompression and  Fusion of Lumbar Four-Five Lumbar Five-Sacral One, Pedicle Screws of Lumbar Four- Lumbar Five, Lumbar Fiv   ROTATOR CUFF REPAIR Bilateral 2009   TOTAL HIP ARTHROPLASTY Right 08/22/2021   Procedure: TOTAL HIP ARTHROPLASTY;  Surgeon: Edna Toribio LABOR, MD;  Location: [facility] ORS;  Service: Orthopedics;  Laterality: Right;   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  GEN:  The patient appears stated age and is in NAD. HEENT:  Normocephalic, atraumatic.   Neurological examination:  General: NAD, well-groomed, appears stated age. Orientation: The patient is alert. Oriented to person, place and not to date.***  Cranial nerves: There is good facial symmetry.The speech is fluent and clear. No aphasia or dysarthria. Fund of knowledge is appropriate. Recent memory impaired and remote memory is normal.  Attention and concentration are normal.  Able to name objects and repeat phrases.  Hearing is intact to conversational tone ***.   Delayed recall *** Sensation: Sensation is intact to light touch throughout Motor: Strength is at least antigravity x4. DTR's 2/4 in UE/LE      02/04/2024    5:00 PM  Montreal Cognitive Assessment   Visuospatial/ Executive (0/5) 3  Naming (0/3) 3  Attention: Read list of digits (0/2) 2  Attention: Read list of letters (0/1) 1  Attention: Serial 7 subtraction starting at 100 (0/3) 0  Language: Repeat phrase (0/2) 2  Language : Fluency (0/1) 1  Abstraction (0/2) 2  Delayed Recall (0/5) 5   Orientation (0/6) 6  Total 25  Adjusted Score (based on education) 25       04/07/2024   10:09 AM  MMSE - Mini Mental State Exam  Not completed: Unable to complete      Movement examination: Tone: There is normal tone in the UE/LE Abnormal movements:  no tremor.  No myoclonus.  No asterixis.   Coordination:  There is no decremation with RAM's. Normal finger to nose  Gait and Station: The patient has no difficulty arising out of a deep-seated chair without the use of the hands. The patient's stride length is good.  Gait is cautious and narrow.   Thank you for allowing us  the opportunity to participate in the care of this nice patient. Please do not hesitate to contact us  for any questions or concerns.   Total time spent on today's visit was *** minutes dedicated to this patient today, preparing to see patient, examining the patient, ordering tests and/or medications and counseling the patient, documenting clinical information in the EHR or other health record, independently interpreting results and communicating results to the patient/family, discussing treatment and goals, answering patient's questions and coordinating care.  Cc:  Anders Otto DASEN, MD  Camie Sevin 08/04/2024 5:20 AM      "

## 2024-08-05 ENCOUNTER — Ambulatory Visit (INDEPENDENT_AMBULATORY_CARE_PROVIDER_SITE_OTHER): Admitting: Physician Assistant

## 2024-08-05 ENCOUNTER — Encounter: Payer: Self-pay | Admitting: Physician Assistant

## 2024-08-05 VITALS — BP 135/64 | HR 87 | Resp 20 | Ht 63.0 in | Wt 209.0 lb

## 2024-08-05 DIAGNOSIS — R413 Other amnesia: Secondary | ICD-10-CM

## 2024-08-05 DIAGNOSIS — F0670 Mild neurocognitive disorder due to known physiological condition without behavioral disturbance: Secondary | ICD-10-CM | POA: Diagnosis not present

## 2024-08-05 NOTE — Patient Instructions (Signed)
 " It was a pleasure to see you today at our office.   Recommendations:   Repeat Neurocognitive evaluation in 1 year    Follow up in  1 year, after the neuropsych was done    [email or web address]   RECOMMENDATIONS FOR ALL PATIENTS WITH MEMORY PROBLEMS: 1. Continue to exercise (Recommend 30 minutes of walking everyday, or 3 hours every week) 2. Increase social interactions - continue going to [place] and enjoy social gatherings with friends and family 3. Eat healthy, avoid fried foods and eat more fruits and vegetables 4. Maintain adequate blood pressure, blood sugar, and blood cholesterol level. Reducing the risk of stroke and cardiovascular disease also helps promoting better memory. 5. Avoid stressful situations. Live a simple life and avoid aggravations. Organize your time and prepare for the next day in anticipation. 6. Sleep well, avoid any interruptions of sleep and avoid any distractions in the bedroom that may interfere with adequate sleep quality 7. Avoid sugar, avoid sweets as there is a strong link between excessive sugar intake, diabetes, and cognitive impairment We discussed the Mediterranean diet, which has been shown to help patients reduce the risk of progressive memory disorders and reduces cardiovascular risk. This includes eating fish, eat fruits and green leafy vegetables, nuts like almonds and hazelnuts, walnuts, and also use olive oil. Avoid fast foods and fried foods as much as possible. Avoid sweets and sugar as sugar use has been linked to worsening of memory function.  There is always a concern of gradual progression of memory problems. If this is the case, then we may need to adjust level of care according to patient needs. Support, both to the patient and caregiver, should then be put into place.      You have been referred for a neuropsychological evaluation (i.e., evaluation of memory and thinking abilities).  Please bring someone with you to this appointment if possible, as it is helpful for the doctor to hear from both you and another adult who knows you well. Please bring eyeglasses and hearing aids if you wear them.    The evaluation will take approximately 3 hours and has two parts:   The first part is a clinical interview with the neuropsychologist (Dr. Richie or Dr. Gayland). During the interview, the neuropsychologist will speak with you and the individual you brought to the appointment.    The second part of the evaluation is testing with the doctor's technician Neal or Luke). During the testing, the technician will ask you to remember different types of material, solve problems, and answer some questionnaires. Your family member will not be present for this portion of the evaluation.   Please note: We must reserve several hours of the neuropsychologist's time and the psychometrician's time for your evaluation appointment. As such, there is a No-Show fee of $100. If you are unable to attend any of your appointments, please contact our office as soon as possible to reschedule.      DRIVING: Regarding driving, in patients with progressive memory problems, driving will be impaired. We advise to have someone else do the driving if trouble finding directions or if minor accidents are reported. Independent driving assessment is available to determine safety of driving.   If you are interested in the driving assessment, you can contact the following:  [facility] [employer] in [place] [phone]  [facility] [phone]  [facility] [phone]  [facility] [phone] or [phone]   FALL PRECAUTIONS: Be cautious when walking. Scan the area for  obstacles that may increase the risk of trips and falls. When getting up in the mornings, sit up at the edge of the bed for a few minutes before getting out of bed. Consider elevating the bed at the head end  to avoid drop of blood pressure when getting up. Walk always in a well-lit room (use night lights in the walls). Avoid area rugs or power cords from appliances in the middle of the walkways. Use a walker or a cane if necessary and consider physical therapy for balance exercise. Get your eyesight checked regularly.  FINANCIAL OVERSIGHT: Supervision, especially oversight when making financial decisions or transactions is also recommended.  HOME SAFETY: Consider the safety of the kitchen when operating appliances like stoves, microwave oven, and blender. Consider having supervision and share cooking responsibilities until no longer able to participate in those. Accidents with firearms and other hazards in the house should be identified and addressed as well.   ABILITY TO BE LEFT ALONE: If patient is unable to contact 911 operator, consider using LifeLine, or when the need is there, arrange for someone to stay with patients. Smoking is a fire hazard, consider supervision or cessation. Risk of wandering should be assessed by caregiver and if detected at any point, supervision and safe proof recommendations should be instituted.  MEDICATION SUPERVISION: Inability to self-administer medication needs to be constantly addressed. Implement a mechanism to ensure safe administration of the medications.      Mediterranean Diet A Mediterranean diet refers to food and lifestyle choices that are based on the traditions of countries located on the [place]. This way of eating has been shown to help prevent certain conditions and improve outcomes for people who have chronic diseases, like kidney disease and heart disease. What are tips for following this plan? Lifestyle  Cook and eat meals together with your family, when possible. Drink enough fluid to keep your urine clear or pale yellow. Be physically active every day. This includes: Aerobic exercise like running or swimming. Leisure activities like  gardening, walking, or housework. Get 7-8 hours of sleep each night. If recommended by your health care provider, drink red wine in moderation. This means 1 glass a day for nonpregnant women and 2 glasses a day for men. A glass of wine equals 5 oz (150 mL). Reading food labels  Check the serving size of packaged foods. For foods such as rice and pasta, the serving size refers to the amount of cooked product, not dry. Check the total fat in packaged foods. Avoid foods that have saturated fat or trans fats. Check the ingredients list for added sugars, such as corn syrup. Shopping  At the grocery store, buy most of your food from the areas near the walls of the store. This includes: Fresh fruits and vegetables (produce). Grains, beans, nuts, and seeds. Some of these may be available in unpackaged forms or large amounts (in bulk). Fresh seafood. Poultry and eggs. Low-fat dairy products. Buy whole ingredients instead of prepackaged foods. Buy fresh fruits and vegetables in-season from local farmers markets. Buy frozen fruits and vegetables in resealable bags. If you do not have access to quality fresh seafood, buy precooked frozen shrimp or canned fish, such as tuna, salmon, or sardines. Buy small amounts of raw or cooked vegetables, salads, or olives from the deli or salad bar at your store. Stock your pantry so you always have certain foods on hand, such as olive oil, canned tuna, canned tomatoes, rice, pasta, and beans. Cooking  Cook foods with extra-virgin olive oil instead of using butter or other vegetable oils. Have meat as a side dish, and have vegetables or grains as your main dish. This means having meat in small portions or adding small amounts of meat to foods like pasta or stew. Use beans or vegetables instead of meat in common dishes like chili or lasagna. Experiment with different cooking methods. Try roasting or broiling vegetables instead of steaming or sauteing them. Add frozen  vegetables to soups, stews, pasta, or rice. Add nuts or seeds for added healthy fat at each meal. You can add these to yogurt, salads, or vegetable dishes. Marinate fish or vegetables using olive oil, lemon juice, garlic, and fresh herbs. Meal planning  Plan to eat 1 vegetarian meal one day each week. Try to work up to 2 vegetarian meals, if possible. Eat seafood 2 or more times a week. Have healthy snacks readily available, such as: Vegetable sticks with hummus. Greek yogurt. Fruit and nut trail mix. Eat balanced meals throughout the week. This includes: Fruit: 2-3 servings a day Vegetables: 4-5 servings a day Low-fat dairy: 2 servings a day Fish, poultry, or lean meat: 1 serving a day Beans and legumes: 2 or more servings a week Nuts and seeds: 1-2 servings a day Whole grains: 6-8 servings a day Extra-virgin olive oil: 3-4 servings a day Limit red meat and sweets to only a few servings a month What are my food choices? Mediterranean diet Recommended Grains: Whole-grain pasta. Brown rice. Bulgar wheat. Polenta. Couscous. Whole-wheat bread. [place] Made[place]. Vegetables: Artichokes. Beets. Broccoli. Cabbage. Carrots. Eggplant. Green beans. Chard. Kale. Spinach. Onions. Leeks. Peas. Squash. Tomatoes. Peppers. Radishes. Fruits: Apples. Apricots. Avocado. Berries. Bananas. Cherries. Dates. Figs. Grapes. Lemons. Melon. Oranges. Peaches. Plums. Pomegranate. Meats and other protein foods: Beans. Almonds. Sunflower seeds. Pine nuts. Peanuts. Cod. Salmon. Scallops. Shrimp. Tuna. Tilapia. Clams. Oysters. Eggs. Dairy: Low-fat milk. Cheese. Greek yogurt. Beverages: Water . Red wine. Herbal tea. Fats and oils: Extra virgin olive oil. Avocado oil. Grape seed oil. Sweets and desserts: Greek yogurt with honey. Baked apples. Poached pears. Trail mix. Seasoning and other foods: Basil. Cilantro. Coriander. Cumin. Mint. Parsley. Sage. Rosemary. Tarragon. Garlic. Oregano. Thyme. Pepper. Balsalmic vinegar.  Tahini. Hummus. Tomato sauce. Olives. Mushrooms. Limit these Grains: Prepackaged pasta or rice dishes. Prepackaged cereal with added sugar. Vegetables: Deep fried potatoes (french fries). Fruits: Fruit canned in syrup. Meats and other protein foods: Beef. Pork. Lamb. Poultry with skin. Hot dogs. [place]. Dairy: Ice cream. Sour cream. Whole milk. Beverages: Juice. Sugar-sweetened soft drinks. Beer. Liquor and spirits. Fats and oils: Butter. Canola oil. Vegetable oil. Beef fat (tallow). Lard. Sweets and desserts: Cookies. Cakes. Pies. Candy. Seasoning and other foods: Mayonnaise. Premade sauces and marinades. The items listed may not be a complete list. Talk with your dietitian about what dietary choices are right for you. Summary The Mediterranean diet includes both food and lifestyle choices. Eat a variety of fresh fruits and vegetables, beans, nuts, seeds, and whole grains. Limit the amount of red meat and sweets that you eat. Talk with your health care provider about whether it is safe for you to drink red wine in moderation. This means 1 glass a day for nonpregnant women and 2 glasses a day for men. A glass of wine equals 5 oz (150 mL). This information is not intended to replace advice given to you by your health care provider. Make sure you discuss any questions you have with your health care provider. Document Released: 03/02/2016 Document  Revised: 04/04/2016 Document Reviewed: 03/02/2016 Elsevier Interactive Patient Education  2017 [employer].     "

## 2024-08-07 ENCOUNTER — Telehealth: Payer: Self-pay | Admitting: Student

## 2024-08-07 ENCOUNTER — Ambulatory Visit
Admission: RE | Admit: 2024-08-07 | Discharge: 2024-08-07 | Disposition: A | Source: Ambulatory Visit | Attending: Family Medicine

## 2024-08-07 ENCOUNTER — Other Ambulatory Visit: Payer: Self-pay

## 2024-08-07 ENCOUNTER — Ambulatory Visit: Admitting: Student

## 2024-08-07 ENCOUNTER — Encounter: Payer: Self-pay | Admitting: Student

## 2024-08-07 VITALS — BP 139/78 | HR 68 | Ht 63.0 in | Wt 209.6 lb

## 2024-08-07 DIAGNOSIS — R002 Palpitations: Secondary | ICD-10-CM

## 2024-08-07 DIAGNOSIS — R053 Chronic cough: Secondary | ICD-10-CM | POA: Diagnosis present

## 2024-08-07 DIAGNOSIS — R062 Wheezing: Secondary | ICD-10-CM

## 2024-08-07 DIAGNOSIS — R0601 Orthopnea: Secondary | ICD-10-CM | POA: Diagnosis not present

## 2024-08-07 MED ORDER — IPRATROPIUM-ALBUTEROL 0.5-2.5 (3) MG/3ML IN SOLN: 3.0000 mL | RESPIRATORY_TRACT | 1 refills | Status: AC | PRN

## 2024-08-07 MED ORDER — FUROSEMIDE 20 MG PO TABS: 20.0000 mg | ORAL_TABLET | Freq: Every day | ORAL | 0 refills | Status: AC

## 2024-08-07 NOTE — Progress Notes (Signed)
" ° ° °  SUBJECTIVE:   CHIEF COMPLAINT / HPI:   Discussed the use of AI scribe software for clinical note transcription with the patient, who gave verbal consent to proceed.  History of Present Illness Angie [name] is a 79 year old female who presents with persistent cough and wheezing.  Cough and sputum production - Persistent cough following treatment for possible COPD exacerbation or viral bronchitis diagnosed on January 9 - Cough is productive of thick white-clear mucus - Cough worsens when lying down - Uses extra pillows to breathe more comfortably - Uses albuterol  inhaler during severe coughing fits occurring every couple of hours, which allows her to rest between episodes - No fever or chest pain  Wheezing and dyspnea - Wheezing improved after 5 day course of prednisone , 3 day course of azithromycin , and albuterol  inhaler - Difficulty breathing when lying flat (orthopnea) - Needs to sit up to relieve dyspnea - Exertional dyspnea present  Cardiac symptoms - Occasional sensation of heart 'flipping like a pancake' - No known arrhythmias - No chest pain - No known heart failure - Chronic leg swelling    OBJECTIVE:   BP 139/78   Pulse 68   Ht 5' 3 (1.6 m)   Wt 209 lb 9.6 oz (95.1 kg)   SpO2 96%   BMI 37.13 kg/m    General: NAD, pleasant Cardio: RRR, no MRG. Respiratory: Mild bilateral extra wheezing, no crackles, no focal findings, normal wob on RA Skin: Warm and dry  ASSESSMENT/PLAN:   Assessment & Plan Chronic cough Developed chronic cough.  Normal work of breathing oxygenation exam today.  Only mild expiratory wheezing. - Obtain CXR - Continue albuterol  inhaler Palpitations - EKG reassuring in office today - BMP today - TSH in the past year normal - Consideration of Zio patch Orthopnea -Prior echo 3 years ago, with normal ejection fraction and only grade 1 diastolic dysfunction (age-related). - Repeat echocardiogram - CXR as above - Trial  Lasix  20 mg p.o. daily - Follow-up in 1 week  Gladis Church, DO [facility]  "

## 2024-08-07 NOTE — Telephone Encounter (Signed)
 Called patient.  Confirmed identity.  Reviewed chest x-ray results with patient.  Advised against using Lasix , as no pulmonary edema.  Will send in DuoNeb nebulizer treatment, plan to follow-up in 1 week.

## 2024-08-07 NOTE — Patient Instructions (Addendum)
 It was great to see you! Thank you for allowing me to participate in your care!   I recommend that you always bring your medications to each appointment as this makes it easy to ensure we are on the correct medications and helps us  not miss when refills are needed.  Our plans for today:   VISIT SUMMARY: Today, you were seen for a persistent cough and wheezing. We discussed your symptoms, including difficulty breathing when lying down, and occasional heart palpitations. We have ordered tests to further evaluate your condition.  YOUR PLAN: CHRONIC COUGH: You have a persistent cough and wheezing with thick mucus. Your symptoms have improved with medication, but we need to rule out other conditions like pneumonia or heart failure. -Continue using your albuterol  inhaler as needed for wheezing. -We have ordered a chest x-ray to check for pneumonia or fluid in your lungs. -Seek emergency care if you experience severe difficulty breathing or chest pain.  CARDIAC EVALUATION: You have reported heart palpitations, difficulty breathing when lying down, and leg swelling. We need to evaluate your heart function to rule out heart failure. -We have ordered an EKG to assess your heart rhythm and function. -We have ordered an echocardiogram to evaluate your heart function. -We have obtained basic lab work including a metabolic panel. -We will review your EKG results in the office today and consider a Zio patch if needed.   Take care and seek immediate care sooner if you develop any concerns. Please remember to show up 15 minutes before your scheduled appointment time!  Gladis Church, DO [facility]   An x-ray was ordered for you---you do not need an appointment to have this completed.  I recommend going to [facility] [address] [place]  If the results are normal,I will send you a letter  I will call you with results if anything is abnormal

## 2024-08-08 ENCOUNTER — Ambulatory Visit: Payer: Self-pay | Admitting: Student

## 2024-08-08 LAB — BASIC METABOLIC PANEL WITH GFR
BUN/Creatinine Ratio: 17 (ref 12–28)
BUN: 13 mg/dL (ref 8–27)
CO2: 29 mmol/L (ref 20–29)
Calcium: 9.7 mg/dL (ref 8.7–10.3)
Chloride: 98 mmol/L (ref 96–106)
Creatinine, Ser: 0.77 mg/dL (ref 0.57–1.00)
Glucose: 102 mg/dL — ABNORMAL HIGH (ref 70–99)
Potassium: 3.4 mmol/L — ABNORMAL LOW (ref 3.5–5.2)
Sodium: 141 mmol/L (ref 134–144)
eGFR: 79 mL/min/1.73

## 2024-08-13 ENCOUNTER — Telehealth: Payer: Self-pay

## 2024-08-13 NOTE — Telephone Encounter (Signed)
 Received fax from pharmacy for [facility]. I dont see this on current medication list. If you want patient to start this medication. Please send Rx with dose, duration and instructions. Nelson Land, CMA

## 2024-08-15 ENCOUNTER — Ambulatory Visit: Admission: RE | Admit: 2024-08-15 | Source: Ambulatory Visit

## 2024-08-15 ENCOUNTER — Other Ambulatory Visit: Payer: Self-pay | Admitting: Family Medicine

## 2024-08-18 ENCOUNTER — Other Ambulatory Visit

## 2024-08-26 ENCOUNTER — Ambulatory Visit
Admission: RE | Admit: 2024-08-26 | Discharge: 2024-08-26 | Disposition: A | Source: Ambulatory Visit | Attending: Neurosurgery

## 2024-08-26 DIAGNOSIS — R2681 Unsteadiness on feet: Secondary | ICD-10-CM

## 2024-08-26 DIAGNOSIS — I671 Cerebral aneurysm, nonruptured: Secondary | ICD-10-CM

## 2024-08-26 MED ORDER — GADOPICLENOL 0.5 MMOL/ML IV SOLN
10.0000 mL | Freq: Once | INTRAVENOUS | Status: AC | PRN
  Administered 2024-08-26: 10 mL via INTRAVENOUS

## 2024-08-28 ENCOUNTER — Ambulatory Visit (HOSPITAL_COMMUNITY): Admission: RE | Admit: 2024-08-28 | Discharge: 2024-08-28 | Attending: Surgery | Admitting: Surgery

## 2024-08-28 DIAGNOSIS — R0609 Other forms of dyspnea: Secondary | ICD-10-CM

## 2024-08-28 DIAGNOSIS — R0601 Orthopnea: Secondary | ICD-10-CM

## 2024-08-28 DIAGNOSIS — R600 Localized edema: Secondary | ICD-10-CM | POA: Diagnosis not present

## 2024-08-28 LAB — ECHOCARDIOGRAM COMPLETE
AR max vel: 2.95 cm2
AV Area VTI: 3.6 cm2
AV Area mean vel: 3.51 cm2
AV Mean grad: 4.5 mmHg
AV Peak grad: 10.2 mmHg
Ao pk vel: 1.6 m/s
Area-P 1/2: 3.36 cm2
S' Lateral: 2.43 cm

## 2025-04-09 ENCOUNTER — Encounter

## 2025-08-05 ENCOUNTER — Institutional Professional Consult (permissible substitution): Payer: Self-pay | Admitting: Psychology

## 2025-08-05 ENCOUNTER — Ambulatory Visit: Payer: Self-pay

## 2025-08-12 ENCOUNTER — Encounter: Payer: Self-pay | Admitting: Psychology

## 2025-08-31 ENCOUNTER — Ambulatory Visit: Payer: Self-pay | Admitting: Physician Assistant
